# Patient Record
Sex: Female | Born: 1945
Health system: Southern US, Community
[De-identification: ages and names within clinical notes are randomized; demographics above are authoritative.]

## PROBLEM LIST (undated history)

## (undated) DIAGNOSIS — L600 Ingrowing nail: Secondary | ICD-10-CM

## (undated) DIAGNOSIS — M1990 Unspecified osteoarthritis, unspecified site: Secondary | ICD-10-CM

## (undated) DIAGNOSIS — M069 Rheumatoid arthritis, unspecified: Secondary | ICD-10-CM

## (undated) DIAGNOSIS — I10 Essential (primary) hypertension: Secondary | ICD-10-CM

## (undated) DIAGNOSIS — J383 Other diseases of vocal cords: Secondary | ICD-10-CM

## (undated) DIAGNOSIS — C801 Malignant (primary) neoplasm, unspecified: Secondary | ICD-10-CM

## (undated) DIAGNOSIS — Z923 Personal history of irradiation: Secondary | ICD-10-CM

## (undated) HISTORY — DX: Essential (primary) hypertension: I10

## (undated) HISTORY — PX: ECTOPIC PREGNANCY SURGERY: SHX613

## (undated) HISTORY — DX: Ingrowing nail: L60.0

## (undated) HISTORY — PX: EXTERNAL EAR SURGERY: SHX627

## (undated) HISTORY — DX: Unspecified osteoarthritis, unspecified site: M19.90

## (undated) HISTORY — DX: Malignant (primary) neoplasm, unspecified: C80.1

---

## 2013-03-24 DIAGNOSIS — S82899A Other fracture of unspecified lower leg, initial encounter for closed fracture: Secondary | ICD-10-CM | POA: Diagnosis not present

## 2013-03-26 DIAGNOSIS — S8263XA Displaced fracture of lateral malleolus of unspecified fibula, initial encounter for closed fracture: Secondary | ICD-10-CM | POA: Diagnosis not present

## 2013-05-30 DIAGNOSIS — S8263XA Displaced fracture of lateral malleolus of unspecified fibula, initial encounter for closed fracture: Secondary | ICD-10-CM | POA: Diagnosis not present

## 2013-07-18 DIAGNOSIS — S8263XA Displaced fracture of lateral malleolus of unspecified fibula, initial encounter for closed fracture: Secondary | ICD-10-CM | POA: Diagnosis not present

## 2013-11-05 DIAGNOSIS — M159 Polyosteoarthritis, unspecified: Secondary | ICD-10-CM | POA: Diagnosis not present

## 2013-11-05 DIAGNOSIS — F17200 Nicotine dependence, unspecified, uncomplicated: Secondary | ICD-10-CM | POA: Diagnosis not present

## 2013-11-05 DIAGNOSIS — E7800 Pure hypercholesterolemia, unspecified: Secondary | ICD-10-CM | POA: Diagnosis not present

## 2013-11-05 DIAGNOSIS — I10 Essential (primary) hypertension: Secondary | ICD-10-CM | POA: Diagnosis not present

## 2013-11-27 ENCOUNTER — Ambulatory Visit (INDEPENDENT_AMBULATORY_CARE_PROVIDER_SITE_OTHER): Payer: Medicare Other | Admitting: Internal Medicine

## 2013-11-27 VITALS — BP 122/70 | HR 94 | Temp 98.1°F | Resp 16 | Ht 63.0 in | Wt 117.4 lb

## 2013-11-27 DIAGNOSIS — M2550 Pain in unspecified joint: Secondary | ICD-10-CM

## 2013-11-27 DIAGNOSIS — R61 Generalized hyperhidrosis: Secondary | ICD-10-CM

## 2013-11-27 DIAGNOSIS — M069 Rheumatoid arthritis, unspecified: Secondary | ICD-10-CM | POA: Diagnosis not present

## 2013-11-27 LAB — POCT URINALYSIS DIPSTICK
BILIRUBIN UA: NEGATIVE
Glucose, UA: NEGATIVE
Ketones, UA: NEGATIVE
Leukocytes, UA: NEGATIVE
NITRITE UA: NEGATIVE
Protein, UA: NEGATIVE
Spec Grav, UA: 1.01
UROBILINOGEN UA: 0.2
pH, UA: 5

## 2013-11-27 LAB — POCT CBC
Granulocyte percent: 70.3 %G (ref 37–80)
HCT, POC: 33.4 % — AB (ref 37.7–47.9)
HEMOGLOBIN: 10.5 g/dL — AB (ref 12.2–16.2)
Lymph, poc: 2.1 (ref 0.6–3.4)
MCH: 29.9 pg (ref 27–31.2)
MCHC: 31.4 g/dL — AB (ref 31.8–35.4)
MCV: 95.2 fL (ref 80–97)
MID (CBC): 0.7 (ref 0–0.9)
MPV: 7.1 fL (ref 0–99.8)
PLATELET COUNT, POC: 670 10*3/uL — AB (ref 142–424)
POC GRANULOCYTE: 6.6 (ref 2–6.9)
POC LYMPH %: 22.6 % (ref 10–50)
POC MID %: 7.1 % (ref 0–12)
RBC: 3.51 M/uL — AB (ref 4.04–5.48)
RDW, POC: 13.5 %
WBC: 9.4 10*3/uL (ref 4.6–10.2)

## 2013-11-27 LAB — POCT UA - MICROSCOPIC ONLY
CASTS, UR, LPF, POC: NEGATIVE
Crystals, Ur, HPF, POC: NEGATIVE
Mucus, UA: NEGATIVE
YEAST UA: NEGATIVE

## 2013-11-27 MED ORDER — IBUPROFEN 600 MG PO TABS: 600.0000 mg | ORAL_TABLET | Freq: Three times a day (TID) | ORAL | Status: DC | PRN

## 2013-11-27 MED ORDER — HYDROCODONE-ACETAMINOPHEN 5-325 MG PO TABS: 1.0000 | ORAL_TABLET | Freq: Four times a day (QID) | ORAL | Status: DC | PRN

## 2013-11-27 NOTE — Patient Instructions (Signed)

## 2013-11-27 NOTE — Progress Notes (Signed)
Subjective:    Patient ID: Jean [name], female    DOB: [birth year], 68 y.o.   MRN: [id number]  HPI  Patient presents today with swollen joints. The joint pain started at the beginning of May. She takes Motrin to help her joints to move. She feels she is taking to much Motrin daily. She has joint pain in the ankles, knees, wrists, hands, elbows, shoulders. She is having a hard time walking, when she sits she gets real stiff. She states that her knuckles have bumps that have started at the beginning of May. She has swelling on her fingers and she is unable to open cans or bottles. She states that the swelling aroung her feet and ankles do not stop swelling. No rashes, no diarrhea, she does have some constipation, she is having night sweats, no tick bites, no fever, no respritory symptoms, no anorexia No ticks seen and low risk for ticks.  Review of Systems  Constitutional: Positive for diaphoresis. Negative for fever, appetite change, fatigue and unexpected weight change.  HENT: Negative.   Eyes: Negative.   Respiratory: Negative.   Cardiovascular: Negative.   Gastrointestinal: Negative.   Endocrine: Negative.   Genitourinary: Negative.   Musculoskeletal: Positive for arthralgias and joint swelling. Negative for myalgias and neck pain.  Skin: Negative.   Allergic/Immunologic: Positive for environmental allergies.  Neurological: Negative.   Hematological: Negative.   Psychiatric/Behavioral: Negative.        Objective:   Physical Exam  Constitutional: She is oriented to person, place, and time. She appears well-developed and well-nourished. She appears distressed.  HENT:  Head: Normocephalic.  Right Ear: External ear normal.  Left Ear: External ear normal.  Nose: Nose normal.  Mouth/Throat: Oropharynx is clear and moist.  Eyes: Conjunctivae and EOM are normal. Pupils are equal, round, and reactive to light. No scleral icterus.  Neck: Normal range of motion. Neck supple. No  tracheal deviation present. No thyromegaly present.  Cardiovascular: Normal rate, regular rhythm and normal heart sounds.   Pulmonary/Chest: Effort normal and breath sounds normal.  Musculoskeletal: She exhibits tenderness.  Right hand and wrist swollen, warm, tender Left hand less involved Shoulders and knees tender no erythema or sweeling appreciable Feet minimally involved Spine nit involved  Lymphadenopathy:    She has no cervical adenopathy.  Neurological: She is alert and oriented to person, place, and time. She exhibits normal muscle tone. Coordination normal.  Skin: No rash noted.  Psychiatric: She has a normal mood and affect. Her behavior is normal. Thought content normal.     Arthritis panel/Lyme screen/Sed rate     Assessment & Plan:  Acute asymmetric polyarticular arthritis and arthralgias Motrin 600mg  tid pc/Vicodin prn

## 2013-11-28 LAB — COMPLETE METABOLIC PANEL WITH GFR
ALK PHOS: 108 U/L (ref 39–117)
ALT: 13 U/L (ref 0–35)
AST: 16 U/L (ref 0–37)
Albumin: 3.6 g/dL (ref 3.5–5.2)
BUN: 14 mg/dL (ref 6–23)
CALCIUM: 9.3 mg/dL (ref 8.4–10.5)
CHLORIDE: 103 meq/L (ref 96–112)
CO2: 23 mEq/L (ref 19–32)
Creat: 0.73 mg/dL (ref 0.50–1.10)
GFR, Est African American: >89 mL/min
GFR, Est Non African American: 87 mL/min
Glucose, Bld: 115 mg/dL — ABNORMAL HIGH (ref 70–99)
POTASSIUM: 4.2 meq/L (ref 3.5–5.3)
Sodium: 136 mEq/L (ref 135–145)
Total Bilirubin: 0.3 mg/dL (ref 0.2–1.2)
Total Protein: 7 g/dL (ref 6.0–8.3)

## 2013-11-28 LAB — RHEUMATOID FACTOR: Rhuematoid fact SerPl-aCnc: 10 IU/mL (ref <=14)

## 2013-11-28 LAB — SEDIMENTATION RATE: Sed Rate: 67 mm/hr — ABNORMAL HIGH (ref 0–22)

## 2013-11-28 LAB — TSH: TSH: 1.855 u[IU]/mL (ref 0.350–4.500)

## 2013-11-30 ENCOUNTER — Ambulatory Visit (INDEPENDENT_AMBULATORY_CARE_PROVIDER_SITE_OTHER): Payer: Medicare Other | Admitting: Internal Medicine

## 2013-11-30 ENCOUNTER — Ambulatory Visit
Admission: RE | Admit: 2013-11-30 | Discharge: 2013-11-30 | Disposition: A | Discharge status: Home / Self Care | Payer: Medicare Other | Source: Ambulatory Visit | Attending: Rheumatology | Admitting: Rheumatology

## 2013-11-30 ENCOUNTER — Other Ambulatory Visit: Payer: Self-pay | Admitting: Rheumatology

## 2013-11-30 VITALS — BP 124/82 | HR 90 | Temp 97.8°F | Resp 16 | Ht 64.0 in | Wt 117.2 lb

## 2013-11-30 DIAGNOSIS — R9389 Abnormal findings on diagnostic imaging of other specified body structures: Secondary | ICD-10-CM | POA: Diagnosis not present

## 2013-11-30 DIAGNOSIS — M130 Polyarthritis, unspecified: Secondary | ICD-10-CM

## 2013-11-30 DIAGNOSIS — Z113 Encounter for screening for infections with a predominantly sexual mode of transmission: Secondary | ICD-10-CM | POA: Diagnosis not present

## 2013-11-30 DIAGNOSIS — R700 Elevated erythrocyte sedimentation rate: Secondary | ICD-10-CM | POA: Diagnosis not present

## 2013-11-30 DIAGNOSIS — M25549 Pain in joints of unspecified hand: Secondary | ICD-10-CM | POA: Diagnosis not present

## 2013-11-30 DIAGNOSIS — I011 Acute rheumatic endocarditis: Secondary | ICD-10-CM

## 2013-11-30 DIAGNOSIS — M25579 Pain in unspecified ankle and joints of unspecified foot: Secondary | ICD-10-CM | POA: Diagnosis not present

## 2013-11-30 DIAGNOSIS — M353 Polymyalgia rheumatica: Secondary | ICD-10-CM | POA: Diagnosis not present

## 2013-11-30 DIAGNOSIS — M25469 Effusion, unspecified knee: Secondary | ICD-10-CM | POA: Diagnosis not present

## 2013-11-30 DIAGNOSIS — M2550 Pain in unspecified joint: Secondary | ICD-10-CM | POA: Diagnosis not present

## 2013-11-30 DIAGNOSIS — R5383 Other fatigue: Secondary | ICD-10-CM | POA: Diagnosis not present

## 2013-11-30 DIAGNOSIS — M069 Rheumatoid arthritis, unspecified: Secondary | ICD-10-CM | POA: Diagnosis not present

## 2013-11-30 DIAGNOSIS — M25569 Pain in unspecified knee: Secondary | ICD-10-CM | POA: Diagnosis not present

## 2013-11-30 DIAGNOSIS — Z111 Encounter for screening for respiratory tuberculosis: Secondary | ICD-10-CM | POA: Diagnosis not present

## 2013-11-30 DIAGNOSIS — R892 Abnormal level of other drugs, medicaments and biological substances in specimens from other organs, systems and tissues: Secondary | ICD-10-CM | POA: Diagnosis not present

## 2013-11-30 DIAGNOSIS — R5381 Other malaise: Secondary | ICD-10-CM | POA: Diagnosis not present

## 2013-11-30 DIAGNOSIS — Z79899 Other long term (current) drug therapy: Secondary | ICD-10-CM | POA: Diagnosis not present

## 2013-11-30 NOTE — Progress Notes (Signed)
Subjective:    Patient ID: Jean [name], female    DOB: 06/08/1946, 68 y.o.   MRN: [id number]  HPI 68 year old here for a follow up on joint pain. She is getting some relief with the pain medicine but the pain continues when it wears off. She has limited motion in both shoulders, knees and wrists. Swelling is not as bad today. She is progresively feeling worse altho motrin helps.Labs show elevated sed rate, anemia. RA is negative. No fever felt. Harder to rise up out of the chair and walks still legged due to pain   Review of Systems     Objective:   Physical Exam  Constitutional: She is oriented to person, place, and time. She appears well-developed and well-nourished. She appears distressed.  HENT:  Head: Normocephalic.  Eyes: EOM are normal. No scleral icterus.  Neck: Normal range of motion.  Pulmonary/Chest: Effort normal.  Musculoskeletal: She exhibits edema and tenderness.  Shoulders and and knees are most tender with inflammation. Hands , fingers , and wrists are symmetically swollen and tender Gait is stiff legged due to pain in joints.  Neurological: She is alert and oriented to person, place, and time. No cranial nerve deficit or sensory deficit. She exhibits abnormal muscle tone. Coordination and gait abnormal.  Skin: Rash noted.  Psychiatric: She has a normal mood and affect. Her behavior is normal.     Results for orders placed in visit on 11/27/13  COMPLETE METABOLIC PANEL WITH GFR      Result Value Ref Range   Sodium 136  135 - 145 mEq/L   Potassium 4.2  3.5 - 5.3 mEq/L   Chloride 103  96 - 112 mEq/L   CO2 23  19 - 32 mEq/L   Glucose, Bld 115 (*) 70 - 99 mg/dL   BUN 14  6 - 23 mg/dL   Creat 8.29  5.62 - 1.30 mg/dL   Total Bilirubin 0.3  0.2 - 1.2 mg/dL   Alkaline Phosphatase 108  39 - 117 U/L   AST 16  0 - 37 U/L   ALT 13  0 - 35 U/L   Total Protein 7.0  6.0 - 8.3 g/dL   Albumin 3.6  3.5 - 5.2 g/dL   Calcium 9.3  8.4 - 86.5 mg/dL   GFR, Est  African American >89     GFR, Est Non African American 87    TSH      Result Value Ref Range   TSH 1.855  0.350 - 4.500 uIU/mL  RHEUMATOID FACTOR      Result Value Ref Range   Rheumatoid Factor 10  <=14 IU/mL  SEDIMENTATION RATE      Result Value Ref Range   Sed Rate 67 (*) 0 - 22 mm/hr  POCT CBC      Result Value Ref Range   WBC 9.4  4.6 - 10.2 K/uL   Lymph, poc 2.1  0.6 - 3.4   POC LYMPH PERCENT 22.6  10 - 50 %L   MID (cbc) 0.7  0 - 0.9   POC MID % 7.1  0 - 12 %M   POC Granulocyte 6.6  2 - 6.9   Granulocyte percent 70.3  37 - 80 %G   RBC 3.51 (*) 4.04 - 5.48 M/uL   Hemoglobin 10.5 (*) 12.2 - 16.2 g/dL   HCT, POC 78.4 (*) 69.6 - 47.9 %   MCV 95.2  80 - 97 fL   MCH, POC 29.9  27 - 31.2 pg   MCHC 31.4 (*) 31.8 - 35.4 g/dL   RDW, POC 84.1     Platelet Count, POC 670 (*) 142 - 424 K/uL   MPV 7.1  0 - 99.8 fL  POCT UA - MICROSCOPIC ONLY      Result Value Ref Range   WBC, Ur, HPF, POC 0-2     RBC, urine, microscopic 1-4     Bacteria, U Microscopic trace     Mucus, UA neg     Epithelial cells, urine per micros 0-2     Crystals, Ur, HPF, POC neg     Casts, Ur, LPF, POC neg     Yeast, UA neg    POCT URINALYSIS DIPSTICK      Result Value Ref Range   Color, UA yellow     Clarity, UA clear     Glucose, UA neg     Bilirubin, UA neg     Ketones, UA neg     Spec Grav, UA 1.010     Blood, UA small     pH, UA 5.0     Protein, UA neg     Urobilinogen, UA 0.2     Nitrite, UA neg     Leukocytes, UA Negative          Assessment & Plan:  Anemia/Polyarticular asymmetric arthritis/Probable PMR Refer urgently today to Dr. Netta Neat called and spoke with her

## 2013-11-30 NOTE — Progress Notes (Signed)
Subjective:    Patient ID: Jean [name], female    DOB: [birth year], 68 y.o.   MRN: [id number]  HPI    Review of Systems     Objective:   Physical Exam        Assessment & Plan:

## 2013-11-30 NOTE — Patient Instructions (Signed)
Polymyalgia Rheumatica Polymyalgia rheumatica (also called PMR or polymyalgia) is a rheumatologic (arthritic) condition that causes pain and morning stiffness in your neck, shoulders, and hips. It is an inflammatory condition. In some people, inflammation of certain structures in the shoulder, hips, or other joints can be seen on special testing. It does not cause joint destruction, as occurs in other arthritic conditions. It usually occurs after 66 years of age, and is more common as you age. It can be confused with several other diseases, but it is usually easily treated. People with PMR often have, or can develop, a more severe rheumatologic condition called giant cell arteritis (also called CGA or temporal arteritis).  CAUSES  The exact cause of PMR is not known.   There are genetic factors involved.  Viruses have been suspected in the cause of PMR. This has not been proven. SYMPTOMS   Aching, pain, and morning stiffness your neck, both shoulders, or both hips.  Symptoms usually start slowly and build gradually.  Morning stiffness usually lasts at least 30 minutes.  Swelling and tenderness in other joints of the arms, hands, legs, and feet may occur.  Swelling and inflammation in the wrists can cause nerve inflammation at the wrist (carpal tunnel syndrome).  You may also have low grade fever, fatigue, weakness, decreased appetite and weight loss. DIAGNOSIS   Your caregiver may suspect that you have PMR based on your description of your symptoms and on your exam.  Your caregiver will examine you to be sure you do not have diseases that can be confused with PMR. These diseases include rheumatoid arthritis, fibromyalgia, or thyroid disease.  Your caregiver should check for signs of giant cell arteritis. This can cause serious complications such as blindness.  Lab tests can help confirm that you have PMR and not other diseases, but are sometimes inconclusive.  X-rays cannot show PMR.  However, it can identify other diseases like rheumatoid arthritis. Your caregiver may have you see a specialist in arthritis and inflammatory diseases (rheumatologist). TREATMENT  The goal of treatment is relief of symptoms. Treatment does not shorten the course of the illness or prevent complications. With proper treatment, you usually feel better almost right away.   The initial treatment of PMR is usually a cortisone (steroid) medication. Your caregiver will help determine a starting dose. The dose is gradually reduced every few weeks to months. Treatment usually lasts one to three years.  Other stronger medications are rarely needed. They will only be prescribed if your symptoms do not get better on cortisone medication alone, or if they recur as the dose is reduced.  Cortisone medication can have different side effects. With the doses of cortisone needed for PMR, the side effects can affect bones and joints, blood sugar control in diabetes, and mood changes. Discuss this with your caregiver.  Your caregiver will evaluate you regularly during your treatment. They will do this in order to assess progress and to check for complications of the illness or treatment.  Physical therapy is sometimes useful. This is especially true if your joints are still stiff after other symptoms have improved. HOME CARE INSTRUCTIONS   Follow your caregiver's instructions. Do not change your dose of cortisone medication on your own.  Keep your appointments for follow-up lab tests and caregiver visits. Your lab tests need to be monitored. You must get checked periodically for giant cell arteritis.  Follow your caregiver's guidance regarding physical activity (usually no restrictions are needed) or physical therapy.  Your caregiver  may have instructions to prevent or check for side effects from cortisone medication (including bone density testing or treatment). Follow their instructions carefully. SEEK MEDICAL  CARE IF:   You develop any side effects from treatment. Side effects can include:  Elevated blood pressure.  High blood sugar (or worsening of diabetes, if you are diabetic).  Difficulty fighting off infections.  Weight gain.  Weakness of the bones (osteoporosis).  Your aches, pains, morning stiffness, or other symptoms get worse with time. This is especially true after your dose of cortisone is reduced.  You develop new joint symptoms (pain, swelling, etc.) SEEK IMMEDIATE MEDICAL CARE IF:   You develop a severe headache.  You start vomiting.  You have problems with your vision.  You have an oral temperature above 102 F (38.9 C), not controlled by medicine. Document Released: 07/15/2004 Document Revised: 05/24/2012 Document Reviewed: 10/28/2008 [facility] Patient Information 2014 [place], [place].

## 2013-12-07 DIAGNOSIS — Z111 Encounter for screening for respiratory tuberculosis: Secondary | ICD-10-CM | POA: Diagnosis not present

## 2013-12-14 DIAGNOSIS — M159 Polyosteoarthritis, unspecified: Secondary | ICD-10-CM | POA: Diagnosis not present

## 2013-12-14 DIAGNOSIS — Z111 Encounter for screening for respiratory tuberculosis: Secondary | ICD-10-CM | POA: Diagnosis not present

## 2013-12-14 DIAGNOSIS — I10 Essential (primary) hypertension: Secondary | ICD-10-CM | POA: Diagnosis not present

## 2013-12-14 DIAGNOSIS — E7800 Pure hypercholesterolemia, unspecified: Secondary | ICD-10-CM | POA: Diagnosis not present

## 2013-12-20 DIAGNOSIS — M2550 Pain in unspecified joint: Secondary | ICD-10-CM | POA: Diagnosis not present

## 2013-12-20 DIAGNOSIS — M069 Rheumatoid arthritis, unspecified: Secondary | ICD-10-CM | POA: Diagnosis not present

## 2013-12-20 DIAGNOSIS — M25469 Effusion, unspecified knee: Secondary | ICD-10-CM | POA: Diagnosis not present

## 2014-01-23 DIAGNOSIS — Z79899 Other long term (current) drug therapy: Secondary | ICD-10-CM | POA: Diagnosis not present

## 2014-02-09 DIAGNOSIS — Z79899 Other long term (current) drug therapy: Secondary | ICD-10-CM | POA: Diagnosis not present

## 2014-02-22 DIAGNOSIS — M25549 Pain in joints of unspecified hand: Secondary | ICD-10-CM | POA: Diagnosis not present

## 2014-02-22 DIAGNOSIS — M069 Rheumatoid arthritis, unspecified: Secondary | ICD-10-CM | POA: Diagnosis not present

## 2014-02-22 DIAGNOSIS — R6889 Other general symptoms and signs: Secondary | ICD-10-CM | POA: Diagnosis not present

## 2014-02-22 DIAGNOSIS — Z09 Encounter for follow-up examination after completed treatment for conditions other than malignant neoplasm: Secondary | ICD-10-CM | POA: Diagnosis not present

## 2014-03-04 ENCOUNTER — Telehealth: Payer: Self-pay | Admitting: Oncology

## 2014-03-04 DIAGNOSIS — Z79899 Other long term (current) drug therapy: Secondary | ICD-10-CM | POA: Diagnosis not present

## 2014-03-04 NOTE — Telephone Encounter (Signed)
C/D 03/04/14 for appt. 03/05/14

## 2014-03-04 NOTE — Telephone Encounter (Signed)
S/W PATIENT AND GAVE NP APPT FOR 09/15 @ 10:30 W/DR. SHADAD.  REFERRING DR. Terrence Dupont DX- PLT CT 808

## 2014-03-05 ENCOUNTER — Ambulatory Visit: Payer: Medicare Other

## 2014-03-05 ENCOUNTER — Encounter: Payer: Self-pay | Admitting: Oncology

## 2014-03-05 ENCOUNTER — Other Ambulatory Visit (HOSPITAL_BASED_OUTPATIENT_CLINIC_OR_DEPARTMENT_OTHER): Payer: Medicare Other

## 2014-03-05 ENCOUNTER — Telehealth: Payer: Self-pay | Admitting: Oncology

## 2014-03-05 ENCOUNTER — Other Ambulatory Visit: Payer: Self-pay | Admitting: Oncology

## 2014-03-05 ENCOUNTER — Ambulatory Visit (HOSPITAL_BASED_OUTPATIENT_CLINIC_OR_DEPARTMENT_OTHER): Payer: Medicare Other | Admitting: Oncology

## 2014-03-05 VITALS — BP 149/84 | HR 80 | Temp 98.4°F | Resp 18 | Ht 64.0 in | Wt 126.5 lb

## 2014-03-05 DIAGNOSIS — D473 Essential (hemorrhagic) thrombocythemia: Secondary | ICD-10-CM

## 2014-03-05 DIAGNOSIS — D75839 Thrombocytosis, unspecified: Secondary | ICD-10-CM

## 2014-03-05 DIAGNOSIS — D72829 Elevated white blood cell count, unspecified: Secondary | ICD-10-CM | POA: Diagnosis not present

## 2014-03-05 DIAGNOSIS — D638 Anemia in other chronic diseases classified elsewhere: Secondary | ICD-10-CM | POA: Diagnosis not present

## 2014-03-05 DIAGNOSIS — D66 Hereditary factor VIII deficiency: Secondary | ICD-10-CM

## 2014-03-05 LAB — CBC WITH DIFFERENTIAL/PLATELET
BASO%: 0.4 % (ref 0.0–2.0)
BASOS ABS: 0.1 10*3/uL (ref 0.0–0.1)
EOS ABS: 0.1 10*3/uL (ref 0.0–0.5)
EOS%: 0.4 % (ref 0.0–7.0)
HCT: 36.2 % (ref 34.8–46.6)
HEMOGLOBIN: 11.3 g/dL — AB (ref 11.6–15.9)
LYMPH%: 8.3 % — AB (ref 14.0–49.7)
MCH: 28.8 pg (ref 25.1–34.0)
MCHC: 31.2 g/dL — ABNORMAL LOW (ref 31.5–36.0)
MCV: 92.2 fL (ref 79.5–101.0)
MONO#: 0.5 10*3/uL (ref 0.1–0.9)
MONO%: 3 % (ref 0.0–14.0)
NEUT#: 13.7 10*3/uL — ABNORMAL HIGH (ref 1.5–6.5)
NEUT%: 87.9 % — ABNORMAL HIGH (ref 38.4–76.8)
Platelets: 414 10*3/uL — ABNORMAL HIGH (ref 145–400)
RBC: 3.92 10*6/uL (ref 3.70–5.45)
RDW: 19.5 % — ABNORMAL HIGH (ref 11.2–14.5)
WBC: 15.5 10*3/uL — AB (ref 3.9–10.3)
lymph#: 1.3 10*3/uL (ref 0.9–3.3)

## 2014-03-05 LAB — COMPREHENSIVE METABOLIC PANEL (CC13)
ALT: 28 U/L (ref 0–55)
ANION GAP: 8 meq/L (ref 3–11)
AST: 12 U/L (ref 5–34)
Albumin: 3.2 g/dL — ABNORMAL LOW (ref 3.5–5.0)
Alkaline Phosphatase: 85 U/L (ref 40–150)
BILIRUBIN TOTAL: 0.2 mg/dL (ref 0.20–1.20)
BUN: 10.6 mg/dL (ref 7.0–26.0)
CO2: 24 meq/L (ref 22–29)
Calcium: 8.8 mg/dL (ref 8.4–10.4)
Chloride: 109 mEq/L (ref 98–109)
Creatinine: 0.8 mg/dL (ref 0.6–1.1)
GLUCOSE: 99 mg/dL (ref 70–140)
Potassium: 3.9 mEq/L (ref 3.5–5.1)
SODIUM: 141 meq/L (ref 136–145)
Total Protein: 6.5 g/dL (ref 6.4–8.3)

## 2014-03-05 LAB — CHCC SMEAR

## 2014-03-05 NOTE — Consult Note (Signed)
Reason for Referral: Thrombocytosis.   HPI: 68 year old pleasant woman native of [place] but currently lives in this area now. She has a history of hypertension but for the most part have been relatively healthy. She is a retired [employer] and currently in house her daughter in the care of her grandchildren. She was in her usual state of health till about May of 2015 where she presented with diffuse arthralgias and myalgias. She noted hand and joint swelling and subsequently shoulder pain and was debilitated enough that she could not able to ambulate. She was evaluated at urgent care in June of 2015. At that time her hemoglobin was 10.5 and white blood count 9.4. Her sedimentation rate was elevated at 67. She was evaluated by rheumatology and was started initially on high doses of prednisone and subsequently IM methotrexate. She was tapered slowly from prednisone but had a mild relapse of her joint pain. Most recently she is tapered down to prednisone 20 mg and methotrexate 0.8 IM weekly. Laboratory testing on 01/23/2049 you reveal a platelet count of 547 and her most recent CBC showed a platelet count of 808 hemoglobin of 9.5 and white cell count of 15.9. Her MCV is 87 with a normal differential. Patient referred to me for further evaluation.  Clinically, her symptoms has improved significantly. She still have some tenderness in her right hand but overall joint pain and inflammation have improved dramatically. She is not report any headaches or blurred vision no syncope. Does not report any fevers chills or sweats. She does not report any thrombosis or bleeding. She is not report any chest pain cough or hemoptysis. She does report any nausea, vomiting, abdominal pain or change in her bowel habits. She does report any frequency urgency or hesitancy. She does report to you as well as a mild as mentioned above. Rest of the review of systems unremarkable   Past Medical History  Diagnosis Date  .  Hypertension   :  Current Outpatient Prescriptions  Medication Sig Dispense Refill  . folic acid (FOLVITE) 1 MG tablet Take 1 mg by mouth daily.      Marland Kitchen losartan (COZAAR) 50 MG tablet Take 50 mg by mouth daily.      . methotrexate (50 MG/ML) 1 G injection Inject 0.8 mg into the muscle once a week.      . predniSONE (DELTASONE) 20 MG tablet Take 20 mg by mouth daily with breakfast. Next week will start 15 mg a day       No current facility-administered medications for this visit.     Allergies  Allergen Reactions  . Sulfa Antibiotics   :  No family history on file.:  History   Social History  . Marital Status: Married    Spouse Name: N/A    Number of Children: N/A  . Years of Education: N/A   Occupational History  . Not on file.   Social History Main Topics  . Smoking status: Former Smoker    Quit date: 11/07/2013  . Smokeless tobacco: Not on file  . Alcohol Use: Yes  . Drug Use: No  . Sexual Activity: Not on file   Other Topics Concern  . Not on file   Social History Narrative  . No narrative on file  :  Pertinent items are noted in HPI.  Exam: ECOG 0 Blood pressure 149/84, pulse 80, temperature 98.4 F (36.9 C), temperature source Oral, resp. rate 18, height 5' 4" (1.626 m), weight 126 lb 8 oz (  57.38 kg), SpO2 100.00%. General appearance: alert Head: Normocephalic, without obvious abnormality Throat: lips, mucosa, and tongue normal; teeth and gums normal Neck: no adenopathy Back: symmetric, no curvature. ROM normal. No CVA tenderness. Resp: clear to auscultation bilaterally Cardio: regular rate and rhythm, S1, S2 normal, no murmur, click, rub or gallop GI: soft, non-tender; bowel sounds normal; no masses,  no organomegaly Extremities: extremities normal, atraumatic, no cyanosis or edema Pulses: 2+ and symmetric Skin: Skin color, texture, turgor normal. No rashes or lesions Lymph nodes: Cervical, supraclavicular, and axillary nodes normal.   Recent  Labs  03/05/14 1050  WBC 15.5*  HGB 11.3*  HCT 36.2  PLT 414*    Recent Labs  03/05/14 1050  NA 141  K 3.9  CO2 24  GLUCOSE 99  BUN 10.6  CREATININE 0.8  CALCIUM 8.8     Blood smear review: This was personally reviewed today and showed increased number of platelets.  Increased neutrophils but no evidence of any dysplasia or immature  cells.    Assessment and Plan:   68 year old woman with the following issues  1. Thrombocytosis: The differential diagnosis was discussed today in the setting of autoimmune arthritis and severe inflammatory condition. Reactive thrombocytosis is the most likely etiology in this setting. That is evidenced by her sedimentation rate being high at 67 and clinically showing signs and symptoms of inflammation. Other consideration would include iron deficiency anemia as well as myeloproliferative disease. I think these are less likely at this time given her clinical scenario. Her thrombocytosis have improved with platelet counts down to 414 which is very close to normal. This is correlated to improvement in her inflammation and her autoimmune arthritis. I am checking a JAK-2 mutation for completeness. I see no need intervention at this time for a bone marrow biopsy. A repeat her counts in about 4 months.  2. Leukocytosis: This is also reactive likely related to prednisone as well. Seems to be improving.  3. Anemia: Her hemoglobin today is 11.3 which is close to normal. Thanks this is related to chronic disease I will continue to monitor.

## 2014-03-05 NOTE — Telephone Encounter (Signed)
Pt confirmed labs/ov per 09/15 POF, gave pt AVS......KJ °

## 2014-03-05 NOTE — Progress Notes (Signed)
Checked in new pt with no financial concerns at this time.  Pt has 2 insurances so she should be covered at 100% but I gave her my card for any future questions or concerns.

## 2014-03-05 NOTE — Progress Notes (Signed)
Please see consult note.  

## 2014-03-21 DIAGNOSIS — Z79899 Other long term (current) drug therapy: Secondary | ICD-10-CM | POA: Diagnosis not present

## 2014-04-12 DIAGNOSIS — J019 Acute sinusitis, unspecified: Secondary | ICD-10-CM | POA: Diagnosis not present

## 2014-04-12 DIAGNOSIS — M810 Age-related osteoporosis without current pathological fracture: Secondary | ICD-10-CM | POA: Diagnosis not present

## 2014-04-12 DIAGNOSIS — I10 Essential (primary) hypertension: Secondary | ICD-10-CM | POA: Diagnosis not present

## 2014-04-12 DIAGNOSIS — F17290 Nicotine dependence, other tobacco product, uncomplicated: Secondary | ICD-10-CM | POA: Diagnosis not present

## 2014-04-12 DIAGNOSIS — E785 Hyperlipidemia, unspecified: Secondary | ICD-10-CM | POA: Diagnosis not present

## 2014-04-12 DIAGNOSIS — J029 Acute pharyngitis, unspecified: Secondary | ICD-10-CM | POA: Diagnosis not present

## 2014-05-24 DIAGNOSIS — Z79899 Other long term (current) drug therapy: Secondary | ICD-10-CM | POA: Diagnosis not present

## 2014-05-31 DIAGNOSIS — M0609 Rheumatoid arthritis without rheumatoid factor, multiple sites: Secondary | ICD-10-CM | POA: Diagnosis not present

## 2014-05-31 DIAGNOSIS — M79641 Pain in right hand: Secondary | ICD-10-CM | POA: Diagnosis not present

## 2014-05-31 DIAGNOSIS — Z09 Encounter for follow-up examination after completed treatment for conditions other than malignant neoplasm: Secondary | ICD-10-CM | POA: Diagnosis not present

## 2014-07-05 ENCOUNTER — Encounter: Payer: Self-pay | Admitting: *Deleted

## 2014-07-05 ENCOUNTER — Other Ambulatory Visit: Payer: Medicare Other

## 2014-07-05 ENCOUNTER — Telehealth: Payer: Self-pay | Admitting: Oncology

## 2014-07-05 ENCOUNTER — Ambulatory Visit: Payer: Medicare Other | Admitting: Oncology

## 2014-07-05 NOTE — Telephone Encounter (Signed)
Lm to confirm appt r/s from missed appt to 07/31/14. Mailed cal.

## 2014-07-05 NOTE — Progress Notes (Signed)
Spoke with patient. She forgot her lab and dr visit today. pof sent for 1st available.

## 2014-07-31 ENCOUNTER — Other Ambulatory Visit (HOSPITAL_BASED_OUTPATIENT_CLINIC_OR_DEPARTMENT_OTHER): Payer: Medicare Other

## 2014-07-31 ENCOUNTER — Ambulatory Visit (HOSPITAL_BASED_OUTPATIENT_CLINIC_OR_DEPARTMENT_OTHER): Payer: Medicare Other | Admitting: Oncology

## 2014-07-31 VITALS — BP 140/82 | HR 101 | Temp 98.4°F | Resp 18 | Ht 64.0 in | Wt 129.6 lb

## 2014-07-31 DIAGNOSIS — D75839 Thrombocytosis, unspecified: Secondary | ICD-10-CM

## 2014-07-31 DIAGNOSIS — D72829 Elevated white blood cell count, unspecified: Secondary | ICD-10-CM

## 2014-07-31 DIAGNOSIS — D473 Essential (hemorrhagic) thrombocythemia: Secondary | ICD-10-CM

## 2014-07-31 LAB — CBC WITH DIFFERENTIAL/PLATELET
BASO%: 0.6 % (ref 0.0–2.0)
Basophils Absolute: 0.1 10*3/uL (ref 0.0–0.1)
EOS%: 0.3 % (ref 0.0–7.0)
Eosinophils Absolute: 0 10*3/uL (ref 0.0–0.5)
HCT: 36.6 % (ref 34.8–46.6)
HGB: 11.5 g/dL — ABNORMAL LOW (ref 11.6–15.9)
LYMPH%: 13 % — AB (ref 14.0–49.7)
MCH: 30.2 pg (ref 25.1–34.0)
MCHC: 31.4 g/dL — ABNORMAL LOW (ref 31.5–36.0)
MCV: 96.4 fL (ref 79.5–101.0)
MONO#: 0.6 10*3/uL (ref 0.1–0.9)
MONO%: 6 % (ref 0.0–14.0)
NEUT#: 8.1 10*3/uL — ABNORMAL HIGH (ref 1.5–6.5)
NEUT%: 80.1 % — ABNORMAL HIGH (ref 38.4–76.8)
PLATELETS: 478 10*3/uL — AB (ref 145–400)
RBC: 3.8 10*6/uL (ref 3.70–5.45)
RDW: 17.2 % — ABNORMAL HIGH (ref 11.2–14.5)
WBC: 10.2 10*3/uL (ref 3.9–10.3)
lymph#: 1.3 10*3/uL (ref 0.9–3.3)

## 2014-07-31 NOTE — Progress Notes (Signed)
Hematology and Oncology Follow Up Visit  Jean [name] [id number] 1946-02-01 69 y.o. 07/31/2014 10:41 AM No PCP Per PatientNo ref. provider found   Principle Diagnosis: 69 year old woman with thrombocytosis in the setting of inflammatory arthritis likely reactive in nature. This was diagnosed in September 2015.  Current therapy: Observation and supportive management.  Interim History:  Jean [name] presents today for a follow-up visit. Since her last 69 year old woman I saw in consultation back in September 2015 for leukocytosis and thrombocytosis. She also had an elevated sedimentation rate of 67. She has a long standing history of autoimmune arthritis and currently on methotrexate. Since the last visit, she reports or methotrexate is not really helping her symptoms and she has been tapered off prednisone. Since her prednisone taper her inflammation have gotten worse. Otherwise she has really no other symptoms of infection or hospitalizations. She still have some tenderness in her right hand but overall joint pain and inflammation have improved dramatically. She is not report any headaches or blurred vision no syncope. Does not report any fevers chills or sweats. She does not report any thrombosis or bleeding. She is not report any chest pain cough or hemoptysis. She does report any nausea, vomiting, abdominal pain or change in her bowel habits. She does report any frequency urgency or hesitancy. She does report to you as well as a mild as mentioned above. Rest of the review of systems unremarkable   Medications: I have reviewed the patient's current medications.  Current Outpatient Prescriptions  Medication Sig Dispense Refill  . folic acid (FOLVITE) 1 MG tablet Take 1 mg by mouth daily.    Marland Kitchen losartan (COZAAR) 50 MG tablet Take 50 mg by mouth daily.    . methotrexate (50 MG/ML) 1 G injection Inject 0.8 mg into the muscle once a week.     No current facility-administered medications for this  visit.     Allergies:  Allergies  Allergen Reactions  . Sulfa Antibiotics     Past Medical History, Surgical history, Social history, and Family History were reviewed and updated.   Physical Exam: Blood pressure 140/82, pulse 101, temperature 98.4 F (36.9 C), temperature source Oral, resp. rate 18, height 5\' 4"  (1.626 m), weight 129 lb 9.6 oz (58.786 kg), SpO2 100 %. ECOG: 0 General appearance: alert and cooperative Head: Normocephalic, without obvious abnormality Neck: no adenopathy Lymph nodes: Cervical, supraclavicular, and axillary nodes normal. Heart:regular rate and rhythm, S1, S2 normal, no murmur, click, rub or gallop Lung:chest clear, no wheezing, rales, normal symmetric air entry Abdomin: soft, non-tender, without masses or organomegaly EXT: Bilateral swelling in her hands and wrists.   Lab Results: Lab Results  Component Value Date   WBC 10.2 07/31/2014   HGB 11.5* 07/31/2014   HCT 36.6 07/31/2014   MCV 96.4 07/31/2014   PLT 478* 07/31/2014     Chemistry      Component Value Date/Time   NA 141 03/05/2014 1050   NA 136 11/27/2013 2100   K 3.9 03/05/2014 1050   K 4.2 11/27/2013 2100   CL 103 11/27/2013 2100   CO2 24 03/05/2014 1050   CO2 23 11/27/2013 2100   BUN 10.6 03/05/2014 1050   BUN 14 11/27/2013 2100   CREATININE 0.8 03/05/2014 1050   CREATININE 0.73 11/27/2013 2100      Component Value Date/Time   CALCIUM 8.8 03/05/2014 1050   CALCIUM 9.3 11/27/2013 2100   ALKPHOS 85 03/05/2014 1050   ALKPHOS 108 11/27/2013 2100   AST 12  03/05/2014 1050   AST 16 11/27/2013 2100   ALT 28 03/05/2014 1050   ALT 13 11/27/2013 2100   BILITOT 0.20 03/05/2014 1050   BILITOT 0.3 11/27/2013 2100       Impression and Plan:     70 year old woman with the following issues:  1. Thrombocytosis: The differential diagnosis include reactive finding related to autoimmune arthritis versus other secondary causes. Primary myeloproliferative disorder extremely  unlikely. Her platelet count has been fluctuating and has been correlating with the level of inflammation. Her platelet count dropped down to 414 with treatments of prednisone and now they're up to 478 with her recent flare.  I see no reason for any other hematological workup at this time as I see no sign of a primary hematological condition.  2. Leukocytosis: Was related to prednisone seems to be normalizing.  3. Follow-up: I'll be happy to see her in the future as needed.    [facility], MD 2/10/201610:41 AM

## 2014-08-06 DIAGNOSIS — Z79899 Other long term (current) drug therapy: Secondary | ICD-10-CM | POA: Diagnosis not present

## 2014-08-06 DIAGNOSIS — Z113 Encounter for screening for infections with a predominantly sexual mode of transmission: Secondary | ICD-10-CM | POA: Diagnosis not present

## 2014-08-08 DIAGNOSIS — M25511 Pain in right shoulder: Secondary | ICD-10-CM | POA: Diagnosis not present

## 2014-08-08 DIAGNOSIS — D649 Anemia, unspecified: Secondary | ICD-10-CM | POA: Diagnosis not present

## 2014-08-08 DIAGNOSIS — M25561 Pain in right knee: Secondary | ICD-10-CM | POA: Diagnosis not present

## 2014-08-08 DIAGNOSIS — M79641 Pain in right hand: Secondary | ICD-10-CM | POA: Diagnosis not present

## 2014-08-26 DIAGNOSIS — F17290 Nicotine dependence, other tobacco product, uncomplicated: Secondary | ICD-10-CM | POA: Diagnosis not present

## 2014-08-26 DIAGNOSIS — E785 Hyperlipidemia, unspecified: Secondary | ICD-10-CM | POA: Diagnosis not present

## 2014-08-26 DIAGNOSIS — I10 Essential (primary) hypertension: Secondary | ICD-10-CM | POA: Diagnosis not present

## 2014-08-26 DIAGNOSIS — M1990 Unspecified osteoarthritis, unspecified site: Secondary | ICD-10-CM | POA: Diagnosis not present

## 2014-08-26 DIAGNOSIS — M069 Rheumatoid arthritis, unspecified: Secondary | ICD-10-CM | POA: Diagnosis not present

## 2014-09-03 DIAGNOSIS — M069 Rheumatoid arthritis, unspecified: Secondary | ICD-10-CM | POA: Diagnosis not present

## 2014-10-18 DIAGNOSIS — Z79899 Other long term (current) drug therapy: Secondary | ICD-10-CM | POA: Diagnosis not present

## 2014-11-25 DIAGNOSIS — E785 Hyperlipidemia, unspecified: Secondary | ICD-10-CM | POA: Diagnosis not present

## 2014-11-25 DIAGNOSIS — M1990 Unspecified osteoarthritis, unspecified site: Secondary | ICD-10-CM | POA: Diagnosis not present

## 2014-11-25 DIAGNOSIS — M069 Rheumatoid arthritis, unspecified: Secondary | ICD-10-CM | POA: Diagnosis not present

## 2014-11-25 DIAGNOSIS — F17290 Nicotine dependence, other tobacco product, uncomplicated: Secondary | ICD-10-CM | POA: Diagnosis not present

## 2014-11-25 DIAGNOSIS — I10 Essential (primary) hypertension: Secondary | ICD-10-CM | POA: Diagnosis not present

## 2014-12-31 DIAGNOSIS — Z79899 Other long term (current) drug therapy: Secondary | ICD-10-CM | POA: Diagnosis not present

## 2015-01-09 DIAGNOSIS — M79642 Pain in left hand: Secondary | ICD-10-CM | POA: Diagnosis not present

## 2015-01-09 DIAGNOSIS — M0579 Rheumatoid arthritis with rheumatoid factor of multiple sites without organ or systems involvement: Secondary | ICD-10-CM | POA: Diagnosis not present

## 2015-01-09 DIAGNOSIS — M79641 Pain in right hand: Secondary | ICD-10-CM | POA: Diagnosis not present

## 2015-01-09 DIAGNOSIS — M25561 Pain in right knee: Secondary | ICD-10-CM | POA: Diagnosis not present

## 2015-03-26 DIAGNOSIS — E785 Hyperlipidemia, unspecified: Secondary | ICD-10-CM | POA: Diagnosis not present

## 2015-03-26 DIAGNOSIS — I10 Essential (primary) hypertension: Secondary | ICD-10-CM | POA: Diagnosis not present

## 2015-03-26 DIAGNOSIS — M1990 Unspecified osteoarthritis, unspecified site: Secondary | ICD-10-CM | POA: Diagnosis not present

## 2015-03-26 DIAGNOSIS — F17290 Nicotine dependence, other tobacco product, uncomplicated: Secondary | ICD-10-CM | POA: Diagnosis not present

## 2015-03-26 DIAGNOSIS — M069 Rheumatoid arthritis, unspecified: Secondary | ICD-10-CM | POA: Diagnosis not present

## 2015-04-28 DIAGNOSIS — Z79899 Other long term (current) drug therapy: Secondary | ICD-10-CM | POA: Diagnosis not present

## 2015-05-02 DIAGNOSIS — E785 Hyperlipidemia, unspecified: Secondary | ICD-10-CM | POA: Diagnosis not present

## 2015-05-02 DIAGNOSIS — M069 Rheumatoid arthritis, unspecified: Secondary | ICD-10-CM | POA: Diagnosis not present

## 2015-05-02 DIAGNOSIS — I10 Essential (primary) hypertension: Secondary | ICD-10-CM | POA: Diagnosis not present

## 2015-05-28 DIAGNOSIS — M79671 Pain in right foot: Secondary | ICD-10-CM | POA: Diagnosis not present

## 2015-05-28 DIAGNOSIS — M0579 Rheumatoid arthritis with rheumatoid factor of multiple sites without organ or systems involvement: Secondary | ICD-10-CM | POA: Diagnosis not present

## 2015-05-28 DIAGNOSIS — M79672 Pain in left foot: Secondary | ICD-10-CM | POA: Diagnosis not present

## 2015-05-28 DIAGNOSIS — M79642 Pain in left hand: Secondary | ICD-10-CM | POA: Diagnosis not present

## 2015-05-28 DIAGNOSIS — Z79899 Other long term (current) drug therapy: Secondary | ICD-10-CM | POA: Diagnosis not present

## 2015-05-28 DIAGNOSIS — M79641 Pain in right hand: Secondary | ICD-10-CM | POA: Diagnosis not present

## 2015-07-17 DIAGNOSIS — Z79899 Other long term (current) drug therapy: Secondary | ICD-10-CM | POA: Diagnosis not present

## 2015-07-25 DIAGNOSIS — M1990 Unspecified osteoarthritis, unspecified site: Secondary | ICD-10-CM | POA: Diagnosis not present

## 2015-07-25 DIAGNOSIS — E785 Hyperlipidemia, unspecified: Secondary | ICD-10-CM | POA: Diagnosis not present

## 2015-07-25 DIAGNOSIS — I10 Essential (primary) hypertension: Secondary | ICD-10-CM | POA: Diagnosis not present

## 2015-07-25 DIAGNOSIS — M069 Rheumatoid arthritis, unspecified: Secondary | ICD-10-CM | POA: Diagnosis not present

## 2015-08-20 DIAGNOSIS — M79641 Pain in right hand: Secondary | ICD-10-CM | POA: Diagnosis not present

## 2015-08-20 DIAGNOSIS — M79642 Pain in left hand: Secondary | ICD-10-CM | POA: Diagnosis not present

## 2015-09-03 DIAGNOSIS — M0579 Rheumatoid arthritis with rheumatoid factor of multiple sites without organ or systems involvement: Secondary | ICD-10-CM | POA: Diagnosis not present

## 2015-09-03 DIAGNOSIS — M170 Bilateral primary osteoarthritis of knee: Secondary | ICD-10-CM | POA: Diagnosis not present

## 2015-09-03 DIAGNOSIS — Z09 Encounter for follow-up examination after completed treatment for conditions other than malignant neoplasm: Secondary | ICD-10-CM | POA: Diagnosis not present

## 2015-09-03 DIAGNOSIS — M19271 Secondary osteoarthritis, right ankle and foot: Secondary | ICD-10-CM | POA: Diagnosis not present

## 2015-09-18 DIAGNOSIS — Z79899 Other long term (current) drug therapy: Secondary | ICD-10-CM | POA: Diagnosis not present

## 2015-10-31 DIAGNOSIS — E785 Hyperlipidemia, unspecified: Secondary | ICD-10-CM | POA: Diagnosis not present

## 2015-10-31 DIAGNOSIS — I10 Essential (primary) hypertension: Secondary | ICD-10-CM | POA: Diagnosis not present

## 2015-10-31 DIAGNOSIS — M1990 Unspecified osteoarthritis, unspecified site: Secondary | ICD-10-CM | POA: Diagnosis not present

## 2015-11-19 DIAGNOSIS — Z79899 Other long term (current) drug therapy: Secondary | ICD-10-CM | POA: Diagnosis not present

## 2015-12-16 DIAGNOSIS — M25512 Pain in left shoulder: Secondary | ICD-10-CM | POA: Diagnosis not present

## 2015-12-16 DIAGNOSIS — M174 Other bilateral secondary osteoarthritis of knee: Secondary | ICD-10-CM | POA: Diagnosis not present

## 2015-12-16 DIAGNOSIS — Z09 Encounter for follow-up examination after completed treatment for conditions other than malignant neoplasm: Secondary | ICD-10-CM | POA: Diagnosis not present

## 2015-12-16 DIAGNOSIS — M0579 Rheumatoid arthritis with rheumatoid factor of multiple sites without organ or systems involvement: Secondary | ICD-10-CM | POA: Diagnosis not present

## 2016-01-15 DIAGNOSIS — Z79899 Other long term (current) drug therapy: Secondary | ICD-10-CM | POA: Diagnosis not present

## 2016-01-15 DIAGNOSIS — Z9225 Personal history of immunosupression therapy: Secondary | ICD-10-CM | POA: Diagnosis not present

## 2016-03-24 DIAGNOSIS — Z79899 Other long term (current) drug therapy: Secondary | ICD-10-CM | POA: Diagnosis not present

## 2016-04-07 ENCOUNTER — Ambulatory Visit (INDEPENDENT_AMBULATORY_CARE_PROVIDER_SITE_OTHER): Payer: Medicare Other | Admitting: Rheumatology

## 2016-04-07 DIAGNOSIS — Z79899 Other long term (current) drug therapy: Secondary | ICD-10-CM

## 2016-04-07 DIAGNOSIS — M545 Low back pain: Secondary | ICD-10-CM

## 2016-04-07 DIAGNOSIS — M19071 Primary osteoarthritis, right ankle and foot: Secondary | ICD-10-CM

## 2016-04-07 DIAGNOSIS — M0579 Rheumatoid arthritis with rheumatoid factor of multiple sites without organ or systems involvement: Secondary | ICD-10-CM | POA: Diagnosis not present

## 2016-04-07 DIAGNOSIS — M170 Bilateral primary osteoarthritis of knee: Secondary | ICD-10-CM

## 2016-04-23 LAB — TB SKIN TEST
Induration: 0 mm
TB SKIN TEST: NEGATIVE

## 2016-04-26 ENCOUNTER — Encounter: Payer: Self-pay | Admitting: Rheumatology

## 2016-04-26 DIAGNOSIS — Z111 Encounter for screening for respiratory tuberculosis: Secondary | ICD-10-CM

## 2016-05-21 DIAGNOSIS — I10 Essential (primary) hypertension: Secondary | ICD-10-CM | POA: Diagnosis not present

## 2016-05-21 DIAGNOSIS — M1990 Unspecified osteoarthritis, unspecified site: Secondary | ICD-10-CM | POA: Diagnosis not present

## 2016-05-21 DIAGNOSIS — E785 Hyperlipidemia, unspecified: Secondary | ICD-10-CM | POA: Diagnosis not present

## 2016-06-08 ENCOUNTER — Other Ambulatory Visit: Payer: Self-pay | Admitting: Rheumatology

## 2016-06-08 DIAGNOSIS — Z79899 Other long term (current) drug therapy: Secondary | ICD-10-CM | POA: Diagnosis not present

## 2016-06-08 LAB — CBC WITH DIFFERENTIAL/PLATELET
BASOS ABS: 0 {cells}/uL (ref 0–200)
Basophils Relative: 0 %
EOS PCT: 1 %
Eosinophils Absolute: 81 cells/uL (ref 15–500)
HEMATOCRIT: 38.6 % (ref 35.0–45.0)
Hemoglobin: 12.6 g/dL (ref 11.7–15.5)
LYMPHS ABS: 2997 {cells}/uL (ref 850–3900)
Lymphocytes Relative: 37 %
MCH: 32.2 pg (ref 27.0–33.0)
MCHC: 32.6 g/dL (ref 32.0–36.0)
MCV: 98.7 fL (ref 80.0–100.0)
MPV: 10 fL (ref 7.5–12.5)
Monocytes Absolute: 567 cells/uL (ref 200–950)
Monocytes Relative: 7 %
NEUTROS ABS: 4455 {cells}/uL (ref 1500–7800)
Neutrophils Relative %: 55 %
Platelets: 344 10*3/uL (ref 140–400)
RBC: 3.91 MIL/uL (ref 3.80–5.10)
RDW: 14.9 % (ref 11.0–15.0)
WBC: 8.1 10*3/uL (ref 3.8–10.8)

## 2016-06-08 LAB — COMPLETE METABOLIC PANEL WITH GFR
ALK PHOS: 83 U/L (ref 33–130)
ALT: 12 U/L (ref 6–29)
AST: 14 U/L (ref 10–35)
Albumin: 4.3 g/dL (ref 3.6–5.1)
BUN: 12 mg/dL (ref 7–25)
CO2: 27 mmol/L (ref 20–31)
Calcium: 9.4 mg/dL (ref 8.6–10.4)
Chloride: 108 mmol/L (ref 98–110)
Creat: 1.01 mg/dL — ABNORMAL HIGH (ref 0.50–0.99)
GFR, EST NON AFRICAN AMERICAN: 57 mL/min — AB (ref >=60)
GFR, Est African American: 66 mL/min (ref >=60)
Glucose, Bld: 89 mg/dL (ref 65–99)
POTASSIUM: 4.3 mmol/L (ref 3.5–5.3)
SODIUM: 142 mmol/L (ref 135–146)
Total Bilirubin: 0.2 mg/dL (ref 0.2–1.2)
Total Protein: 6.8 g/dL (ref 6.1–8.1)

## 2016-06-09 NOTE — Progress Notes (Signed)
Decrease methotrexate 2.6 mL subcutaneously per week. Recheck BMP with GFR in one month

## 2016-06-10 ENCOUNTER — Telehealth: Payer: Self-pay | Admitting: Pharmacist

## 2016-06-10 ENCOUNTER — Other Ambulatory Visit: Payer: Self-pay | Admitting: *Deleted

## 2016-06-10 DIAGNOSIS — Z79899 Other long term (current) drug therapy: Secondary | ICD-10-CM

## 2016-06-10 NOTE — Telephone Encounter (Signed)
Patient came by the office with her portion of the patient assistance application.  The full application was faxed to the [employer] patient assistance program.

## 2016-06-10 NOTE — Telephone Encounter (Signed)
Patient brought by paperwork for [facility] Patient Assistance Application renewal for [employer].    Last visit: 04/07/16 Next visit: 09/06/16 Labs: 06/08/16 - CBC normal, CMP with Cr 1.01, GFR 66 TB: PPD negative 05/06/16  Okay to renew [employer] patient assistance program for 2018 per Dr. Estanislado Pandy.  Form was signed by Dr. Estanislado Pandy.  I called patient to update her.  She plans to bring by her portion of the application.  Will fax the full application once she brings her portion.    [name], Pharm.D., BCPS Clinical Pharmacist Pager: [phone] Phone: [phone] 06/10/2016 8:37 AM

## 2016-07-02 NOTE — Progress Notes (Signed)
Received a fax of approval from [facility] patient assistance for patients [employer]. She has been approved through 06-20-17.  Spoke with patient who voiced understanding.   Will send document to scan center.  Jean [name], [place], CPhT

## 2016-07-13 ENCOUNTER — Other Ambulatory Visit: Payer: Self-pay | Admitting: Rheumatology

## 2016-07-13 DIAGNOSIS — Z79899 Other long term (current) drug therapy: Secondary | ICD-10-CM | POA: Diagnosis not present

## 2016-07-13 LAB — BASIC METABOLIC PANEL WITH GFR
BUN: 12 mg/dL (ref 7–25)
CO2: 22 mmol/L (ref 20–31)
Calcium: 9 mg/dL (ref 8.6–10.4)
Chloride: 107 mmol/L (ref 98–110)
Creat: 0.81 mg/dL (ref 0.50–0.99)
GFR, EST NON AFRICAN AMERICAN: 75 mL/min (ref >=60)
GFR, Est African American: 86 mL/min (ref >=60)
GLUCOSE: 117 mg/dL — AB (ref 65–99)
Potassium: 3.9 mmol/L (ref 3.5–5.3)
Sodium: 139 mmol/L (ref 135–146)

## 2016-07-13 NOTE — Progress Notes (Signed)
CMP normal

## 2016-08-20 DIAGNOSIS — I10 Essential (primary) hypertension: Secondary | ICD-10-CM | POA: Diagnosis not present

## 2016-08-20 DIAGNOSIS — E785 Hyperlipidemia, unspecified: Secondary | ICD-10-CM | POA: Diagnosis not present

## 2016-08-20 DIAGNOSIS — M1990 Unspecified osteoarthritis, unspecified site: Secondary | ICD-10-CM | POA: Diagnosis not present

## 2016-09-06 ENCOUNTER — Ambulatory Visit (INDEPENDENT_AMBULATORY_CARE_PROVIDER_SITE_OTHER): Payer: Medicare Other | Admitting: Rheumatology

## 2016-09-06 ENCOUNTER — Encounter: Payer: Self-pay | Admitting: Rheumatology

## 2016-09-06 VITALS — BP 130/72 | HR 68 | Resp 14 | Ht 63.0 in | Wt 130.0 lb

## 2016-09-06 DIAGNOSIS — M19079 Primary osteoarthritis, unspecified ankle and foot: Secondary | ICD-10-CM

## 2016-09-06 DIAGNOSIS — R5383 Other fatigue: Secondary | ICD-10-CM | POA: Diagnosis not present

## 2016-09-06 DIAGNOSIS — M170 Bilateral primary osteoarthritis of knee: Secondary | ICD-10-CM

## 2016-09-06 DIAGNOSIS — Z79899 Other long term (current) drug therapy: Secondary | ICD-10-CM

## 2016-09-06 DIAGNOSIS — M19042 Primary osteoarthritis, left hand: Secondary | ICD-10-CM | POA: Diagnosis not present

## 2016-09-06 DIAGNOSIS — M19041 Primary osteoarthritis, right hand: Secondary | ICD-10-CM

## 2016-09-06 DIAGNOSIS — D75839 Thrombocytosis, unspecified: Secondary | ICD-10-CM

## 2016-09-06 DIAGNOSIS — E559 Vitamin D deficiency, unspecified: Secondary | ICD-10-CM | POA: Diagnosis not present

## 2016-09-06 DIAGNOSIS — D473 Essential (hemorrhagic) thrombocythemia: Secondary | ICD-10-CM | POA: Diagnosis not present

## 2016-09-06 DIAGNOSIS — M0609 Rheumatoid arthritis without rheumatoid factor, multiple sites: Secondary | ICD-10-CM | POA: Diagnosis not present

## 2016-09-06 LAB — CBC WITH DIFFERENTIAL/PLATELET
BASOS PCT: 0 %
Basophils Absolute: 0 cells/uL (ref 0–200)
EOS ABS: 0 {cells}/uL — AB (ref 15–500)
Eosinophils Relative: 0 %
HCT: 41 % (ref 35.0–45.0)
HEMOGLOBIN: 13.3 g/dL (ref 11.7–15.5)
LYMPHS ABS: 2626 {cells}/uL (ref 850–3900)
Lymphocytes Relative: 26 %
MCH: 32.5 pg (ref 27.0–33.0)
MCHC: 32.4 g/dL (ref 32.0–36.0)
MCV: 100.2 fL — AB (ref 80.0–100.0)
MONO ABS: 909 {cells}/uL (ref 200–950)
MONOS PCT: 9 %
MPV: 9.5 fL (ref 7.5–12.5)
NEUTROS ABS: 6565 {cells}/uL (ref 1500–7800)
Neutrophils Relative %: 65 %
PLATELETS: 404 10*3/uL — AB (ref 140–400)
RBC: 4.09 MIL/uL (ref 3.80–5.10)
RDW: 14.4 % (ref 11.0–15.0)
WBC: 10.1 10*3/uL (ref 3.8–10.8)

## 2016-09-06 LAB — COMPLETE METABOLIC PANEL WITH GFR
ALBUMIN: 4.4 g/dL (ref 3.6–5.1)
ALT: 36 U/L — AB (ref 6–29)
AST: 24 U/L (ref 10–35)
Alkaline Phosphatase: 114 U/L (ref 33–130)
BUN: 12 mg/dL (ref 7–25)
CO2: 25 mmol/L (ref 20–31)
Calcium: 9.8 mg/dL (ref 8.6–10.4)
Chloride: 107 mmol/L (ref 98–110)
Creat: 0.64 mg/dL (ref 0.50–0.99)
GFR, Est African American: >89 mL/min (ref >=60)
GFR, Est Non African American: >89 mL/min (ref >=60)
GLUCOSE: 82 mg/dL (ref 65–99)
Potassium: 4.7 mmol/L (ref 3.5–5.3)
Sodium: 141 mmol/L (ref 135–146)
Total Bilirubin: 0.4 mg/dL (ref 0.2–1.2)
Total Protein: 7.1 g/dL (ref 6.1–8.1)

## 2016-09-06 MED ORDER — METHOTREXATE SODIUM CHEMO INJECTION 50 MG/2ML: 15.0000 mg | INTRAMUSCULAR | 0 refills | Status: DC

## 2016-09-06 MED ORDER — ADALIMUMAB 40 MG/0.8ML ~~LOC~~ AJKT: 1.0000 "pen " | AUTO-INJECTOR | SUBCUTANEOUS | 0 refills | Status: DC

## 2016-09-06 MED ORDER — FOLIC ACID 1 MG PO TABS: 2.0000 mg | ORAL_TABLET | Freq: Every day | ORAL | 4 refills | Status: DC

## 2016-09-06 NOTE — Progress Notes (Signed)
Rheumatology Medication Review by a Pharmacist Does the patient feel that his/her medications are working for him/her?  Yes Has the patient been experiencing any side effects to the medications prescribed?  No Does the patient have any problems obtaining medications?  No  Issues to address at subsequent visits: None   Pharmacist comments:  Jean [name] is a pleasant 71 yo F who presents for follow up of her rheumatoid arthritis.  She is currently taking Humira every other week, methotrexate 0.6 mL weekly, and folic acid 2 mg daily.  Her most recent standing labs were on 06/08/16.  She is due for standing labs today.  Most recent TB test was a PPD on 05/06/16 which was negative.  She will be due for repeat TB test in November 2018.  Patient denies any questions or concerns regarding her medications at this time.    [name], Pharm.D., BCPS, CPP Clinical Pharmacist Pager: [phone] Phone: [phone] 09/06/2016 9:51 AM

## 2016-09-06 NOTE — Patient Instructions (Signed)
Standing Labs We placed an order today for your standing lab work.    Please come back and get your standing labs in June 2018 and every 3 months.    We have open lab Monday through Friday from 8:30-11:30 AM and 1:30-4 PM at the office of Dr. Shaili Deveshwar/Naitik Panwala, PA.   The office is located at [address], [address], [place], [place] [address] No appointment is necessary.   Labs are drawn by [facility].  You may receive a bill from [facility] for your lab work.     

## 2016-09-06 NOTE — Progress Notes (Signed)
Office Visit Note  Patient: Jean [name]             Date of Birth: 1945-11-02           MRN: [id number]             PCP: No PCP Per Patient Referring: No ref. provider found Visit Date: 09/06/2016 Occupation: @GUAROCC @    Subjective:  Pain of the Right Hip; Pain of the Left Hip; and Follow-up   History of Present Illness: Jean [name] is a 71 y.o. female  Last seen 04/07/2016. She has seronegative rheumatoid arthritis and is doing well. Taking Humira every 2 weeks And methotrexate 0.8 ML's every week Folic acid 2 mg daily. She has a history of TB indeterminate in the past and therefore goes to the health department and gets her PPD done. She recently did a PPD approximately 04/23/2016. She brought [place] a copy of that negative PPD. She'll get a repeat one annually.  Patient's main complaint today is that she feels like her stamina is decreased overall. She also states that the back of her thighs hurt from time to time and she is "out of energy". She has not discussed this with her PCP yet. Offered her several labs that we can do and then if appropriate we'll refer them back to the PCP for treatment. Patient is agreeable. Specifically, I'll order thyroid panel, vitamin D, CBC with differential and CMP with GFR. Patient is agreeable.   Activities of Daily Living:  Patient reports morning stiffness for 15 minutes.   Patient Denies nocturnal pain.  Difficulty dressing/grooming: Denies Difficulty climbing stairs: Denies Difficulty getting out of chair: Reports Difficulty using hands for taps, buttons, cutlery, and/or writing: Denies   Review of Systems  Constitutional: Negative for fatigue.  HENT: Negative for mouth sores and mouth dryness.   Eyes: Negative for dryness.  Respiratory: Negative for shortness of breath.   Gastrointestinal: Negative for constipation and diarrhea.  Musculoskeletal: Negative for myalgias and myalgias.  Skin: Negative for sensitivity  to sunlight.  Psychiatric/Behavioral: Negative for decreased concentration and sleep disturbance.    PMFS History:  Patient Active Problem List   Diagnosis Date Noted  . Screening-pulmonary TB 04/26/2016    Past Medical History:  Diagnosis Date  . Arthritis   . Hypertension     No family history on file. Past Surgical History:  Procedure Laterality Date  . ECTOPIC PREGNANCY SURGERY    . EXTERNAL EAR SURGERY Right    removal of cyst or gland   Social History   Social History Narrative  . No narrative on file     Objective: Vital Signs: BP 130/72   Pulse 68   Resp 14   Ht 5\' 3"  (1.6 m)   Wt 130 lb (59 kg)   BMI 23.03 kg/m    Physical Exam  Constitutional: She is oriented to person, place, and time. She appears well-developed and well-nourished.  HENT:  Head: Normocephalic and atraumatic.  Eyes: EOM are normal. Pupils are equal, round, and reactive to light.  Cardiovascular: Normal rate, regular rhythm and normal heart sounds.  Exam reveals no gallop and no friction rub.   No murmur heard. Pulmonary/Chest: Effort normal and breath sounds normal. She has no wheezes. She has no rales.  Abdominal: Soft. Bowel sounds are normal. She exhibits no distension. There is no tenderness. There is no guarding. No hernia.  Musculoskeletal: Normal range of motion. She exhibits no edema, tenderness or deformity.  Lymphadenopathy:  She has no cervical adenopathy.  Neurological: She is alert and oriented to person, place, and time. Coordination normal.  Skin: Skin is warm and dry. Capillary refill takes less than 2 seconds. No rash noted.  Psychiatric: She has a normal mood and affect. Her behavior is normal.  Nursing note and vitals reviewed.    Musculoskeletal Exam:    CDAI Exam: CDAI Homunculus Exam:   Joint Counts:  CDAI Tender Joint count: 0 CDAI Swollen Joint count: 0  Global Assessments:  Patient Global Assessment: 3 Provider Global Assessment: 3  CDAI  Calculated Score: 6  No synovitis on examination  Investigation: No additional findings.  Orders Only on 07/13/2016  Component Date Value Ref Range Status  . Sodium 07/13/2016 139  135 - 146 mmol/L Final  . Potassium 07/13/2016 3.9  3.5 - 5.3 mmol/L Final  . Chloride 07/13/2016 107  98 - 110 mmol/L Final  . CO2 07/13/2016 22  20 - 31 mmol/L Final  . Glucose, Bld 07/13/2016 117* 65 - 99 mg/dL Final  . BUN 16/03/9603 12  7 - 25 mg/dL Final  . Creat 54/02/8118 0.81  0.50 - 0.99 mg/dL Final   Comment:   For patients > or = 69 years of age: The upper reference limit for Creatinine is approximately 13% higher for people identified as African-American.     . Calcium 07/13/2016 9.0  8.6 - 10.4 mg/dL Final  . GFR, Est African American 07/13/2016 86  >=60 mL/min Final  . GFR, Est Non African American 07/13/2016 75  >=60 mL/min Final  Orders Only on 06/08/2016  Component Date Value Ref Range Status  . Sodium 06/08/2016 142  135 - 146 mmol/L Final  . Potassium 06/08/2016 4.3  3.5 - 5.3 mmol/L Final  . Chloride 06/08/2016 108  98 - 110 mmol/L Final  . CO2 06/08/2016 27  20 - 31 mmol/L Final  . Glucose, Bld 06/08/2016 89  65 - 99 mg/dL Final  . BUN 14/78/2956 12  7 - 25 mg/dL Final  . Creat 21/30/8657 1.01* 0.50 - 0.99 mg/dL Final   Comment:   For patients > or = 69 years of age: The upper reference limit for Creatinine is approximately 13% higher for people identified as African-American.     . Total Bilirubin 06/08/2016 0.2  0.2 - 1.2 mg/dL Final  . Alkaline Phosphatase 06/08/2016 83  33 - 130 U/L Final  . AST 06/08/2016 14  10 - 35 U/L Final  . ALT 06/08/2016 12  6 - 29 U/L Final  . Total Protein 06/08/2016 6.8  6.1 - 8.1 g/dL Final  . Albumin 84/69/6295 4.3  3.6 - 5.1 g/dL Final  . Calcium 28/41/3244 9.4  8.6 - 10.4 mg/dL Final  . GFR, Est African American 06/08/2016 66  >=60 mL/min Final  . GFR, Est Non African American 06/08/2016 57* >=60 mL/min Final  . WBC 06/08/2016 8.1   3.8 - 10.8 K/uL Final  . RBC 06/08/2016 3.91  3.80 - 5.10 MIL/uL Final  . Hemoglobin 06/08/2016 12.6  11.7 - 15.5 g/dL Final  . HCT 06/23/7251 38.6  35.0 - 45.0 % Final  . MCV 06/08/2016 98.7  80.0 - 100.0 fL Final  . MCH 06/08/2016 32.2  27.0 - 33.0 pg Final  . MCHC 06/08/2016 32.6  32.0 - 36.0 g/dL Final  . RDW 66/44/0347 14.9  11.0 - 15.0 % Final  . Platelets 06/08/2016 344  140 - 400 K/uL Final  . MPV 06/08/2016 10.0  7.5 -  12.5 fL Final  . Neutro Abs 06/08/2016 4455  1,500 - 7,800 cells/uL Final  . Lymphs Abs 06/08/2016 2997  850 - 3,900 cells/uL Final  . Monocytes Absolute 06/08/2016 567  200 - 950 cells/uL Final  . Eosinophils Absolute 06/08/2016 81  15 - 500 cells/uL Final  . Basophils Absolute 06/08/2016 0  0 - 200 cells/uL Final  . Neutrophils Relative % 06/08/2016 55  % Final  . Lymphocytes Relative 06/08/2016 37  % Final  . Monocytes Relative 06/08/2016 7  % Final  . Eosinophils Relative 06/08/2016 1  % Final  . Basophils Relative 06/08/2016 0  % Final  . Smear Review 06/08/2016 Criteria for review not met   Final     Imaging: No results found.  Speciality Comments: No specialty comments available.    Procedures:  No procedures performed Allergies: Sulfa antibiotics   Assessment / Plan:     Visit Diagnoses: High risk medication use - 09/06/2016: Humira every 2 weeks; methotrexate 0.8 ML's every week; folic acid 2 mg daily (adequate response). - Plan: CBC with Differential/Platelet, COMPLETE METABOLIC PANEL WITH GFR, CBC with Differential/Platelet, COMPLETE METABOLIC PANEL WITH GFR  Rheumatoid arthritis, seronegative, multiple sites (HCC)  Primary osteoarthritis of both hands  Primary osteoarthritis of both knees  Osteoarthritis of ankle and foot, unspecified laterality  Fatigue, unspecified type - Plan: Thyroid Panel With TSH, VITAMIN D 25 Hydroxy (Vit-D Deficiency, Fractures)  Thrombocytosis (HCC) - Plan: folic acid (FOLVITE) 1 MG tablet   Plan: #1:  Seronegative rheumatoid arthritis. Doing well. No complaint.  #2: High-risk prescription. On Humira every 2 weeks and doing well. On methotrexate 0.8 ML's every week adequate response. Folic acid 2 mg daily.  #3: OA of the hands, OA of the knee joint, OA of the feet. Doing well with some discomfort at times.  #4: Patient has had PPD done at the health department 04/26/2016 and it was abstracted by Amy. It was negative. She will bring by another copy so that we can have it in the patient's chart word seizure defined.  #5: Return to clinic in 5 months  #6: Refill Humira. Patient gets patient assistance. We sign the form so that she can get the next shipment in  #7: Refill methotrexate to prescription throat ninety-day supply of 0.8 ML's per week  #7: Refill folic acid 2 mg daily ninety-day supply with 4 refills  #8: Patient will do so over sneakers for now to start exercising. She's having some long-standing fatigue/decreased stamina. She states that the back of the legs heard in the thigh. Orders: Orders Placed This Encounter  Procedures  . CBC with Differential/Platelet  . COMPLETE METABOLIC PANEL WITH GFR  . Thyroid Panel With TSH  . VITAMIN D 25 Hydroxy (Vit-D Deficiency, Fractures)   Meds ordered this encounter  Medications  . methotrexate 50 MG/2ML injection    Sig: Inject 0.6 mLs (15 mg total) into the skin once a week.    Dispense:  8 mL    Refill:  0  . folic acid (FOLVITE) 1 MG tablet    Sig: Take 2 tablets (2 mg total) by mouth daily.    Dispense:  180 tablet    Refill:  4  . Adalimumab (HUMIRA PEN) 40 MG/0.8ML PNKT    Sig: Inject 1 pen into the skin every 14 (fourteen) days.    Dispense:  3 each    Refill:  0    3 month supply sent in to patient assistance program  Face-to-face time spent with patient was 30 minutes. 50% of time was spent in counseling and coordination of care.  Follow-Up Instructions: Return in about 5 months (around 02/06/2017) for RA,  Humira q 2 wks, MTX 0.73ml, folic 2mf qd, fatigue.   Tawni Pummel, PA-C  Note - This record has been created using [employer].  Chart creation errors have been sought, but may not always  have been located. Such creation errors do not reflect on  the standard of medical care.

## 2016-09-07 LAB — THYROID PANEL WITH TSH
FREE THYROXINE INDEX: 2 (ref 1.4–3.8)
T3 Uptake: 26 % (ref 22–35)
T4 TOTAL: 7.5 ug/dL (ref 4.5–12.0)
TSH: 1.23 mIU/L

## 2016-09-07 LAB — VITAMIN D 25 HYDROXY (VIT D DEFICIENCY, FRACTURES): Vit D, 25-Hydroxy: 15 ng/mL — ABNORMAL LOW (ref 30–100)

## 2016-09-16 ENCOUNTER — Telehealth: Payer: Self-pay | Admitting: *Deleted

## 2016-09-16 MED ORDER — VITAMIN D (ERGOCALCIFEROL) 1.25 MG (50000 UNIT) PO CAPS: 50000.0000 [IU] | ORAL_CAPSULE | ORAL | 0 refills | Status: DC

## 2016-09-16 NOTE — Telephone Encounter (Signed)
-----   Message from Carlos, [place] sent at 09/07/2016  9:07 AM EDT ----- Plan: #1: Have patient get a PCP so we can for labs of them #2: Thyroid panel is within normal limits #3: CMP with GFR is within normal limits except for mild elevation of a LT at 36 #4: CBC with differential is within normal limits #5: Vitamin D is low at 15. Let's treat the low vitamin D Vitamin D 50,000 units Take 1 pill twice a week (on a Wednesday and then on Saturday) Dispense 24 pills with no refill Recheck vitamin D in 3 months after starting the prescription of vitamin D 3

## 2016-12-21 ENCOUNTER — Other Ambulatory Visit: Payer: Self-pay | Admitting: *Deleted

## 2016-12-21 DIAGNOSIS — Z79899 Other long term (current) drug therapy: Secondary | ICD-10-CM | POA: Diagnosis not present

## 2016-12-21 LAB — CBC WITH DIFFERENTIAL/PLATELET
BASOS ABS: 0 {cells}/uL (ref 0–200)
Basophils Relative: 0 %
EOS ABS: 82 {cells}/uL (ref 15–500)
EOS PCT: 1 %
HCT: 37.5 % (ref 35.0–45.0)
Hemoglobin: 12 g/dL (ref 11.7–15.5)
Lymphocytes Relative: 22 %
Lymphs Abs: 1804 cells/uL (ref 850–3900)
MCH: 32.2 pg (ref 27.0–33.0)
MCHC: 32 g/dL (ref 32.0–36.0)
MCV: 100.5 fL — ABNORMAL HIGH (ref 80.0–100.0)
MONOS PCT: 7 %
MPV: 8.8 fL (ref 7.5–12.5)
Monocytes Absolute: 574 cells/uL (ref 200–950)
NEUTROS ABS: 5740 {cells}/uL (ref 1500–7800)
NEUTROS PCT: 70 %
Platelets: 450 10*3/uL — ABNORMAL HIGH (ref 140–400)
RBC: 3.73 MIL/uL — ABNORMAL LOW (ref 3.80–5.10)
RDW: 14.3 % (ref 11.0–15.0)
WBC: 8.2 10*3/uL (ref 3.8–10.8)

## 2016-12-21 LAB — COMPLETE METABOLIC PANEL WITH GFR
ALT: 15 U/L (ref 6–29)
AST: 17 U/L (ref 10–35)
Albumin: 4 g/dL (ref 3.6–5.1)
Alkaline Phosphatase: 99 U/L (ref 33–130)
BILIRUBIN TOTAL: 0.3 mg/dL (ref 0.2–1.2)
BUN: 9 mg/dL (ref 7–25)
CO2: 23 mmol/L (ref 20–31)
Calcium: 9.5 mg/dL (ref 8.6–10.4)
Chloride: 106 mmol/L (ref 98–110)
Creat: 0.72 mg/dL (ref 0.50–0.99)
GFR, EST NON AFRICAN AMERICAN: 86 mL/min (ref >=60)
GLUCOSE: 66 mg/dL (ref 65–99)
Potassium: 4.7 mmol/L (ref 3.5–5.3)
SODIUM: 140 mmol/L (ref 135–146)
TOTAL PROTEIN: 6.8 g/dL (ref 6.1–8.1)

## 2016-12-23 NOTE — Progress Notes (Signed)
WNL

## 2017-01-04 ENCOUNTER — Telehealth: Payer: Self-pay

## 2017-01-04 NOTE — Telephone Encounter (Signed)
Patient called needing a Rx refill on her Humira, but stated that we have to put an order in for the Rx.  Patient stated that it would take longer if it is faxed.  Cb# is [phone].

## 2017-01-04 NOTE — Telephone Encounter (Signed)
Last Visit: 09/06/16 Next Visit: 01/25/17 Labs: 12/21/16 WNL TB Skin Test 04/23/16 Neg  Okay to refill Humira per Dr Estanislado Pandy.

## 2017-01-06 MED ORDER — ADALIMUMAB 40 MG/0.8ML ~~LOC~~ AJKT: 1.0000 "pen " | AUTO-INJECTOR | SUBCUTANEOUS | 0 refills | Status: DC

## 2017-01-06 NOTE — Telephone Encounter (Signed)
Prescription has called to the Assistance program for refill.

## 2017-01-06 NOTE — Addendum Note (Signed)
Addended by: Carole Binning on: 01/06/2017 02:54 PM   Modules accepted: Orders

## 2017-01-12 ENCOUNTER — Other Ambulatory Visit: Payer: Self-pay | Admitting: Cardiology

## 2017-01-12 DIAGNOSIS — Z1231 Encounter for screening mammogram for malignant neoplasm of breast: Secondary | ICD-10-CM

## 2017-01-19 DIAGNOSIS — M19042 Primary osteoarthritis, left hand: Secondary | ICD-10-CM

## 2017-01-19 DIAGNOSIS — Z79899 Other long term (current) drug therapy: Secondary | ICD-10-CM | POA: Insufficient documentation

## 2017-01-19 DIAGNOSIS — M170 Bilateral primary osteoarthritis of knee: Secondary | ICD-10-CM | POA: Insufficient documentation

## 2017-01-19 DIAGNOSIS — D75839 Thrombocytosis, unspecified: Secondary | ICD-10-CM | POA: Insufficient documentation

## 2017-01-19 DIAGNOSIS — M19041 Primary osteoarthritis, right hand: Secondary | ICD-10-CM | POA: Insufficient documentation

## 2017-01-19 DIAGNOSIS — D539 Nutritional anemia, unspecified: Secondary | ICD-10-CM | POA: Insufficient documentation

## 2017-01-19 DIAGNOSIS — M0609 Rheumatoid arthritis without rheumatoid factor, multiple sites: Secondary | ICD-10-CM | POA: Insufficient documentation

## 2017-01-19 DIAGNOSIS — R5383 Other fatigue: Secondary | ICD-10-CM | POA: Insufficient documentation

## 2017-01-19 DIAGNOSIS — M19071 Primary osteoarthritis, right ankle and foot: Secondary | ICD-10-CM | POA: Insufficient documentation

## 2017-01-19 DIAGNOSIS — M19072 Primary osteoarthritis, left ankle and foot: Secondary | ICD-10-CM

## 2017-01-19 DIAGNOSIS — Z862 Personal history of diseases of the blood and blood-forming organs and certain disorders involving the immune mechanism: Secondary | ICD-10-CM

## 2017-01-19 DIAGNOSIS — D473 Essential (hemorrhagic) thrombocythemia: Secondary | ICD-10-CM | POA: Insufficient documentation

## 2017-01-19 NOTE — Progress Notes (Signed)
Office Visit Note  Patient: Jean [name]             Date of Birth: [birth year]           MRN: [id number]             PCP: Charolette Forward, MD Referring: No ref. provider found Visit Date: 01/25/2017 Occupation: _0 @    Subjective:  Right shoulder pain.   History of Present Illness: Jean [name] is a 71 y.o. female with history of seronegative rheumatoid arthritis. She states her arthritis is fairly well controlled with combination of methotrexate and Humira. She states she has off-and-on discomfort in her bilateral shoulders especially as night when she is sleeping on the side. She denies any joint swelling.  Activities of Daily Living:  Patient reports morning stiffness for 30  minutes.   Patient Denies nocturnal pain.  Difficulty dressing/grooming: Denies Difficulty climbing stairs: Denies Difficulty getting out of chair: Denies Difficulty using hands for taps, buttons, cutlery, and/or writing: Denies   Review of Systems  Constitutional: Positive for fatigue. Negative for night sweats, weight gain, weight loss and weakness.  HENT: Negative for mouth sores, trouble swallowing, trouble swallowing, mouth dryness and nose dryness.   Eyes: Positive for dryness. Negative for pain, redness and visual disturbance.  Respiratory: Negative.  Negative for cough, shortness of breath and difficulty breathing.   Cardiovascular: Negative.  Negative for chest pain, palpitations, hypertension, irregular heartbeat and swelling in legs/feet.  Gastrointestinal: Negative.  Negative for blood in stool, constipation and diarrhea.  Endocrine: Negative.  Negative for increased urination.  Genitourinary: Negative.  Negative for nocturia and vaginal dryness.  Musculoskeletal: Positive for arthralgias, joint pain, muscle weakness and morning stiffness. Negative for joint swelling, myalgias, muscle tenderness and myalgias.  Skin: Negative.  Negative for color change, rash, hair loss,  skin tightness, ulcers and sensitivity to sunlight.  Allergic/Immunologic: Negative for susceptible to infections.  Neurological: Negative.  Negative for dizziness, headaches, memory loss and night sweats.  Hematological: Negative.  Negative for swollen glands.  Psychiatric/Behavioral: Negative.  Negative for depressed mood and sleep disturbance. The patient is not nervous/anxious.     PMFS History:  Patient Active Problem List   Diagnosis Date Noted  . Vitamin D deficiency 01/23/2017  . Rheumatoid arthritis, seronegative, multiple sites ([place]) 01/19/2017  . High risk medication use 01/19/2017  . Primary osteoarthritis of both hands 01/19/2017  . Primary osteoarthritis of both knees 01/19/2017  . Fatigue 01/19/2017  . Thrombocytosis ([place]) 01/19/2017  . Primary osteoarthritis of both feet 01/19/2017  . History of anemia 01/19/2017  . Screening-pulmonary TB 04/26/2016    Past Medical History:  Diagnosis Date  . Arthritis   . Hypertension     History reviewed. No pertinent family history. Past Surgical History:  Procedure Laterality Date  . ECTOPIC PREGNANCY SURGERY    . EXTERNAL EAR SURGERY Right    removal of cyst or gland   Social History   Social History Narrative  . No narrative on file     Objective: Vital Signs: BP 107/66   Pulse 98   Resp 12   Ht 5' 3" (1.6 m)   Wt 125 lb (56.7 kg)   BMI 22.14 kg/m    Physical Exam  Constitutional: She is oriented to person, place, and time. She appears well-developed and well-nourished.  HENT:  Head: Normocephalic and atraumatic.  Eyes: Conjunctivae and EOM are normal.  Neck: Normal range of motion.  Cardiovascular: Normal rate, regular rhythm, normal   Office Visit Note  Patient: [name]             Date of Birth: 06/14/1946           MRN: [id number]             PCP: Harwani, Mohan, MD Referring: No ref. provider found Visit Date: 01/25/2017 Occupation: @GUAROCC@    Subjective:  Right shoulder pain.   History of Present Illness: Jean [name] is a 70 y.o. female with history of seronegative rheumatoid arthritis. She states her arthritis is fairly well controlled with combination of methotrexate and Humira. She states she has off-and-on discomfort in her bilateral shoulders especially as night when she is sleeping on the side. She denies any joint swelling.  Activities of Daily Living:  Patient reports morning stiffness for 30  minutes.   Patient Denies nocturnal pain.  Difficulty dressing/grooming: Denies Difficulty climbing stairs: Denies Difficulty getting out of chair: Denies Difficulty using hands for taps, buttons, cutlery, and/or writing: Denies   Review of Systems  Constitutional: Positive for fatigue. Negative for night sweats, weight gain, weight loss and weakness.  HENT: Negative for mouth sores, trouble swallowing, trouble swallowing, mouth dryness and nose dryness.   Eyes: Positive for dryness. Negative for pain, redness and visual disturbance.  Respiratory: Negative.  Negative for cough, shortness of breath and difficulty breathing.   Cardiovascular: Negative.  Negative for chest pain, palpitations, hypertension, irregular heartbeat and swelling in legs/feet.  Gastrointestinal: Negative.  Negative for blood in stool, constipation and diarrhea.  Endocrine: Negative.  Negative for increased urination.  Genitourinary: Negative.  Negative for nocturia and vaginal dryness.  Musculoskeletal: Positive for arthralgias, joint pain, muscle weakness and morning stiffness. Negative for joint swelling, myalgias, muscle tenderness and myalgias.  Skin: Negative.  Negative for color change, rash, hair loss,  skin tightness, ulcers and sensitivity to sunlight.  Allergic/Immunologic: Negative for susceptible to infections.  Neurological: Negative.  Negative for dizziness, headaches, memory loss and night sweats.  Hematological: Negative.  Negative for swollen glands.  Psychiatric/Behavioral: Negative.  Negative for depressed mood and sleep disturbance. The patient is not nervous/anxious.     PMFS History:  Patient Active Problem List   Diagnosis Date Noted  . Vitamin D deficiency 01/23/2017  . Rheumatoid arthritis, seronegative, multiple sites (HCC) 01/19/2017  . High risk medication use 01/19/2017  . Primary osteoarthritis of both hands 01/19/2017  . Primary osteoarthritis of both knees 01/19/2017  . Fatigue 01/19/2017  . Thrombocytosis (HCC) 01/19/2017  . Primary osteoarthritis of both feet 01/19/2017  . History of anemia 01/19/2017  . Screening-pulmonary TB 04/26/2016    Past Medical History:  Diagnosis Date  . Arthritis   . Hypertension     History reviewed. No pertinent family history. Past Surgical History:  Procedure Laterality Date  . ECTOPIC PREGNANCY SURGERY    . EXTERNAL EAR SURGERY Right    removal of cyst or gland   Social History   Social History Narrative  . No narrative on file     Objective: Vital Signs: BP 107/66   Pulse 98   Resp 12   Ht 5' 3" (1.6 m)   Wt 125 lb (56.7 kg)   BMI 22.14 kg/m    Physical Exam  Constitutional: She is oriented to person, place, and time. She appears well-developed and well-nourished.  HENT:  Head: Normocephalic and atraumatic.  Eyes: Conjunctivae and EOM are normal.  Neck: Normal range of motion.  Cardiovascular: Normal rate, regular rhythm, normal    Office Visit Note  Patient: Haeli L Hatton             Date of Birth: 09/12/1945           MRN: [id number]             PCP: Harwani, Mohan, MD Referring: No ref. provider found Visit Date: 01/25/2017 Occupation: @GUAROCC@    Subjective:  Right shoulder pain.   History of Present Illness: Geral L Brackeen is a 70 y.o. female with history of seronegative rheumatoid arthritis. She states her arthritis is fairly well controlled with combination of methotrexate and Humira. She states she has off-and-on discomfort in her bilateral shoulders especially as night when she is sleeping on the side. She denies any joint swelling.  Activities of Daily Living:  Patient reports morning stiffness for 30  minutes.   Patient Denies nocturnal pain.  Difficulty dressing/grooming: Denies Difficulty climbing stairs: Denies Difficulty getting out of chair: Denies Difficulty using hands for taps, buttons, cutlery, and/or writing: Denies   Review of Systems  Constitutional: Positive for fatigue. Negative for night sweats, weight gain, weight loss and weakness.  HENT: Negative for mouth sores, trouble swallowing, trouble swallowing, mouth dryness and nose dryness.   Eyes: Positive for dryness. Negative for pain, redness and visual disturbance.  Respiratory: Negative.  Negative for cough, shortness of breath and difficulty breathing.   Cardiovascular: Negative.  Negative for chest pain, palpitations, hypertension, irregular heartbeat and swelling in legs/feet.  Gastrointestinal: Negative.  Negative for blood in stool, constipation and diarrhea.  Endocrine: Negative.  Negative for increased urination.  Genitourinary: Negative.  Negative for nocturia and vaginal dryness.  Musculoskeletal: Positive for arthralgias, joint pain, muscle weakness and morning stiffness. Negative for joint swelling, myalgias, muscle tenderness and myalgias.  Skin: Negative.  Negative for color change, rash, hair loss,  skin tightness, ulcers and sensitivity to sunlight.  Allergic/Immunologic: Negative for susceptible to infections.  Neurological: Negative.  Negative for dizziness, headaches, memory loss and night sweats.  Hematological: Negative.  Negative for swollen glands.  Psychiatric/Behavioral: Negative.  Negative for depressed mood and sleep disturbance. The patient is not nervous/anxious.     PMFS History:  Patient Active Problem List   Diagnosis Date Noted  . Vitamin D deficiency 01/23/2017  . Rheumatoid arthritis, seronegative, multiple sites (HCC) 01/19/2017  . High risk medication use 01/19/2017  . Primary osteoarthritis of both hands 01/19/2017  . Primary osteoarthritis of both knees 01/19/2017  . Fatigue 01/19/2017  . Thrombocytosis (HCC) 01/19/2017  . Primary osteoarthritis of both feet 01/19/2017  . History of anemia 01/19/2017  . Screening-pulmonary TB 04/26/2016    Past Medical History:  Diagnosis Date  . Arthritis   . Hypertension     History reviewed. No pertinent family history. Past Surgical History:  Procedure Laterality Date  . ECTOPIC PREGNANCY SURGERY    . EXTERNAL EAR SURGERY Right    removal of cyst or gland   Social History   Social History Narrative  . No narrative on file     Objective: Vital Signs: BP 107/66   Pulse 98   Resp 12   Ht 5' 3" (1.6 m)   Wt 125 lb (56.7 kg)   BMI 22.14 kg/m    Physical Exam  Constitutional: She is oriented to person, place, and time. She appears well-developed and well-nourished.  HENT:  Head: Normocephalic and atraumatic.  Eyes: Conjunctivae and EOM are normal.  Neck: Normal range of motion.  Cardiovascular: Normal rate, regular rhythm, normal

## 2017-01-23 DIAGNOSIS — E559 Vitamin D deficiency, unspecified: Secondary | ICD-10-CM | POA: Insufficient documentation

## 2017-01-25 ENCOUNTER — Ambulatory Visit (INDEPENDENT_AMBULATORY_CARE_PROVIDER_SITE_OTHER): Payer: Medicare Other | Admitting: Rheumatology

## 2017-01-25 ENCOUNTER — Encounter: Payer: Self-pay | Admitting: Rheumatology

## 2017-01-25 VITALS — BP 107/66 | HR 98 | Resp 12 | Ht 63.0 in | Wt 125.0 lb

## 2017-01-25 DIAGNOSIS — M19042 Primary osteoarthritis, left hand: Secondary | ICD-10-CM | POA: Diagnosis not present

## 2017-01-25 DIAGNOSIS — D473 Essential (hemorrhagic) thrombocythemia: Secondary | ICD-10-CM

## 2017-01-25 DIAGNOSIS — M19071 Primary osteoarthritis, right ankle and foot: Secondary | ICD-10-CM

## 2017-01-25 DIAGNOSIS — Z862 Personal history of diseases of the blood and blood-forming organs and certain disorders involving the immune mechanism: Secondary | ICD-10-CM

## 2017-01-25 DIAGNOSIS — E559 Vitamin D deficiency, unspecified: Secondary | ICD-10-CM

## 2017-01-25 DIAGNOSIS — M19072 Primary osteoarthritis, left ankle and foot: Secondary | ICD-10-CM

## 2017-01-25 DIAGNOSIS — M19041 Primary osteoarthritis, right hand: Secondary | ICD-10-CM | POA: Diagnosis not present

## 2017-01-25 DIAGNOSIS — M170 Bilateral primary osteoarthritis of knee: Secondary | ICD-10-CM

## 2017-01-25 DIAGNOSIS — M0609 Rheumatoid arthritis without rheumatoid factor, multiple sites: Secondary | ICD-10-CM | POA: Diagnosis not present

## 2017-01-25 DIAGNOSIS — R5383 Other fatigue: Secondary | ICD-10-CM | POA: Diagnosis not present

## 2017-01-25 DIAGNOSIS — Z79899 Other long term (current) drug therapy: Secondary | ICD-10-CM | POA: Diagnosis not present

## 2017-01-25 DIAGNOSIS — D75839 Thrombocytosis, unspecified: Secondary | ICD-10-CM

## 2017-01-25 MED ORDER — "TUBERCULIN-ALLERGY SYRINGES 27G X 1/2"" 1 ML KIT": 1.0000 | PACK | 3 refills | Status: DC

## 2017-01-25 MED ORDER — METHOTREXATE SODIUM CHEMO INJECTION 50 MG/2ML: 15.0000 mg | INTRAMUSCULAR | 0 refills | Status: DC

## 2017-01-25 MED ORDER — FOLIC ACID 1 MG PO TABS: 2.0000 mg | ORAL_TABLET | Freq: Every day | ORAL | 3 refills | Status: DC

## 2017-01-25 NOTE — Patient Instructions (Addendum)
You are due for your TB skin test in November 2018  Standing Labs We placed an order today for your standing lab work.    Please come back and get your standing labs in October 2018 and every 3 months.  We have open lab Monday through Friday from 8:30-11:30 AM and 1:30-4 PM at the office of Dr. Bo Merino.   The office is located at [address], [place], [place], [place] [address] No appointment is necessary.   Labs are drawn by [facility].  You may receive a bill from [facility] for your lab work. If you have any questions regarding directions or hours of operation,  please call [phone].     Shoulder Exercises Ask your health care provider which exercises are safe for you. Do exercises exactly as told by your health care provider and adjust them as directed. It is normal to feel mild stretching, pulling, tightness, or discomfort as you do these exercises, but you should stop right away if you feel sudden pain or your pain gets worse.Do not begin these exercises until told by your health care provider. RANGE OF MOTION EXERCISES These exercises warm up your muscles and joints and improve the movement and flexibility of your shoulder. These exercises also help to relieve pain, numbness, and tingling. These exercises involve stretching your injured shoulder directly. Exercise A: Pendulum  1. Stand near a wall or a surface that you can hold onto for balance. 2. Bend at the waist and let your left / right arm hang straight down. Use your other arm to support you. Keep your back straight and do not lock your knees. 3. Relax your left / right arm and shoulder muscles, and move your hips and your trunk so your left / right arm swings freely. Your arm should swing because of the motion of your body, not because you are using your arm or shoulder muscles. 4. Keep moving your body so your arm swings in the following directions, as told by your health care provider: ? Side to side. ? Forward  and backward. ? In clockwise and counterclockwise circles. 5. Continue each motion for __________ seconds, or for as long as told by your health care provider. 6. Slowly return to the starting position. Repeat __________ times. Complete this exercise __________ times a day. Exercise B:Flexion, Standing  1. Stand and hold a broomstick, a cane, or a similar object. Place your hands a little more than shoulder-width apart on the object. Your left / right hand should be palm-up, and your other hand should be palm-down. 2. Keep your elbow straight and keep your shoulder muscles relaxed. Push the stick down with your healthy arm to raise your left / right arm in front of your body, and then over your head until you feel a stretch in your shoulder. ? Avoid shrugging your shoulder while you raise your arm. Keep your shoulder blade tucked down toward the middle of your back. 3. Hold for __________ seconds. 4. Slowly return to the starting position. Repeat __________ times. Complete this exercise __________ times a day. Exercise C: Abduction, Standing 1. Stand and hold a broomstick, a cane, or a similar object. Place your hands a little more than shoulder-width apart on the object. Your left / right hand should be palm-up, and your other hand should be palm-down. 2. While keeping your elbow straight and your shoulder muscles relaxed, push the stick across your body toward your left / right side. Raise your left / right arm  to the side of your body and then over your head until you feel a stretch in your shoulder. ? Do not raise your arm above shoulder height, unless your health care provider tells you to do that. ? Avoid shrugging your shoulder while you raise your arm. Keep your shoulder blade tucked down toward the middle of your back. 3. Hold for __________ seconds. 4. Slowly return to the starting position. Repeat __________ times. Complete this exercise __________ times a day. Exercise D:Internal  Rotation  1. Place your left / right hand behind your back, palm-up. 2. Use your other hand to dangle an exercise band, a towel, or a similar object over your shoulder. Grasp the band with your left / right hand so you are holding onto both ends. 3. Gently pull up on the band until you feel a stretch in the front of your left / right shoulder. ? Avoid shrugging your shoulder while you raise your arm. Keep your shoulder blade tucked down toward the middle of your back. 4. Hold for __________ seconds. 5. Release the stretch by letting go of the band and lowering your hands. Repeat __________ times. Complete this exercise __________ times a day. STRETCHING EXERCISES These exercises warm up your muscles and joints and improve the movement and flexibility of your shoulder. These exercises also help to relieve pain, numbness, and tingling. These exercises are done using your healthy shoulder to help stretch the muscles of your injured shoulder. Exercise E: Warehouse manager (External Rotation and Abduction)  1. Stand in a doorway with one of your feet slightly in front of the other. This is called a staggered stance. If you cannot reach your forearms to the door frame, stand facing a corner of a room. 2. Choose one of the following positions as told by your health care provider: ? Place your hands and forearms on the door frame above your head. ? Place your hands and forearms on the door frame at the height of your head. ? Place your hands on the door frame at the height of your elbows. 3. Slowly move your weight onto your front foot until you feel a stretch across your chest and in the front of your shoulders. Keep your head and chest upright and keep your abdominal muscles tight. 4. Hold for __________ seconds. 5. To release the stretch, shift your weight to your back foot. Repeat __________ times. Complete this stretch __________ times a day. Exercise F:Extension, Standing 1. Stand and hold a  broomstick, a cane, or a similar object behind your back. ? Your hands should be a little wider than shoulder-width apart. ? Your palms should face away from your back. 2. Keeping your elbows straight and keeping your shoulder muscles relaxed, move the stick away from your body until you feel a stretch in your shoulder. ? Avoid shrugging your shoulders while you move the stick. Keep your shoulder blade tucked down toward the middle of your back. 3. Hold for __________ seconds. 4. Slowly return to the starting position. Repeat __________ times. Complete this exercise __________ times a day. STRENGTHENING EXERCISES These exercises build strength and endurance in your shoulder. Endurance is the ability to use your muscles for a long time, even after they get tired. Exercise G:External Rotation  1. Sit in a stable chair without armrests. 2. Secure an exercise band at elbow height on your left / right side. 3. Place a soft object, such as a folded towel or a small pillow, between your left /  right upper arm and your body to move your elbow a few inches away (about 10 cm) from your side. 4. Hold the end of the band so it is tight and there is no slack. 5. Keeping your elbow pressed against the soft object, move your left / right forearm out, away from your abdomen. Keep your body steady so only your forearm moves. 6. Hold for __________ seconds. 7. Slowly return to the starting position. Repeat __________ times. Complete this exercise __________ times a day. Exercise H:Shoulder Abduction  1. Sit in a stable chair without armrests, or stand. 2. Hold a __________ weight in your left / right hand, or hold an exercise band with both hands. 3. Start with your arms straight down and your left / right palm facing in, toward your body. 4. Slowly lift your left / right hand out to your side. Do not lift your hand above shoulder height unless your health care provider tells you that this is safe. ? Keep  your arms straight. ? Avoid shrugging your shoulder while you do this movement. Keep your shoulder blade tucked down toward the middle of your back. 5. Hold for __________ seconds. 6. Slowly lower your arm, and return to the starting position. Repeat __________ times. Complete this exercise __________ times a day. Exercise I:Shoulder Extension 1. Sit in a stable chair without armrests, or stand. 2. Secure an exercise band to a stable object in front of you where it is at shoulder height. 3. Hold one end of the exercise band in each hand. Your palms should face each other. 4. Straighten your elbows and lift your hands up to shoulder height. 5. Step back, away from the secured end of the exercise band, until the band is tight and there is no slack. 6. Squeeze your shoulder blades together as you pull your hands down to the sides of your thighs. Stop when your hands are straight down by your sides. Do not let your hands go behind your body. 7. Hold for __________ seconds. 8. Slowly return to the starting position. Repeat __________ times. Complete this exercise __________ times a day. Exercise J:Standing Shoulder Row 1. Sit in a stable chair without armrests, or stand. 2. Secure an exercise band to a stable object in front of you so it is at waist height. 3. Hold one end of the exercise band in each hand. Your palms should be in a thumbs-up position. 4. Bend each of your elbows to an "L" shape (about 90 degrees) and keep your upper arms at your sides. 5. Step back until the band is tight and there is no slack. 6. Slowly pull your elbows back behind you. 7. Hold for __________ seconds. 8. Slowly return to the starting position. Repeat __________ times. Complete this exercise __________ times a day. Exercise K:Shoulder Press-Ups  1. Sit in a stable chair that has armrests. Sit upright, with your feet flat on the floor. 2. Put your hands on the armrests so your elbows are bent and your  fingers are pointing forward. Your hands should be about even with the sides of your body. 3. Push down on the armrests and use your arms to lift yourself off of the chair. Straighten your elbows and lift yourself up as much as you comfortably can. ? Move your shoulder blades down, and avoid letting your shoulders move up toward your ears. ? Keep your feet on the ground. As you get stronger, your feet should support less of your body weight  as you lift yourself up. 4. Hold for __________ seconds. 5. Slowly lower yourself back into the chair. Repeat __________ times. Complete this exercise __________ times a day. Exercise L: Wall Push-Ups  1. Stand so you are facing a stable wall. Your feet should be about one arm-length away from the wall. 2. Lean forward and place your palms on the wall at shoulder height. 3. Keep your feet flat on the floor as you bend your elbows and lean forward toward the wall. 4. Hold for __________ seconds. 5. Straighten your elbows to push yourself back to the starting position. Repeat __________ times. Complete this exercise __________ times a day. This information is not intended to replace advice given to you by your health care provider. Make sure you discuss any questions you have with your health care provider. Document Released: 04/21/2005 Document Revised: 03/01/2016 Document Reviewed: 02/16/2015 Elsevier Interactive Patient Education  2018 [employer].

## 2017-01-25 NOTE — Progress Notes (Signed)
Rheumatology Medication Review by a Pharmacist Does the patient feel that his/her medications are working for him/her?  Yes Has the patient been experiencing any side effects to the medications prescribed?  No Does the patient have any problems obtaining medications?  No, patient is enrolled in the Humira patient assistance program  Issues to address at subsequent visits: None   Pharmacist comments:  Jean [name] is a pleasant 71 yo F who presents for follow up.  She is currently taking Humira 40 mg every other week, methotrexate 0.6 mL weekly, and folic acid 2 mg daily.  [name] recent standing labs were on 12/21/16.  She will be due for standing labs again in October 2018 and every 3 months.  Patient has history of indeterminate TB Gold.  Patient gets yearly TB skin test.  [name] recent TB skin test was [facility].  She will be due for TB test again in November 2018.  Patient was given orders for PPD to take tot he health department.  Patient denies any questions or concerns regarding her medications at this time.   Jean [name], Pharm.D., BCPS, CPP Clinical Pharmacist Pager: [phone] Phone: [phone] 01/25/2017 11:50 AM

## 2017-02-18 DIAGNOSIS — M1990 Unspecified osteoarthritis, unspecified site: Secondary | ICD-10-CM | POA: Diagnosis not present

## 2017-02-18 DIAGNOSIS — E785 Hyperlipidemia, unspecified: Secondary | ICD-10-CM | POA: Diagnosis not present

## 2017-02-18 DIAGNOSIS — I10 Essential (primary) hypertension: Secondary | ICD-10-CM | POA: Diagnosis not present

## 2017-03-17 ENCOUNTER — Telehealth: Payer: Self-pay

## 2017-03-17 NOTE — Telephone Encounter (Signed)
Called [facility] Patient Assistance [employer] to check the dates for patient renewal applications for 1607. Spoke to [place] who states that patients who submitted a renewal application from January 2018 to October 1st 2018 will receive a year end form by mail to complete and fax or mail in.(Provider portion not required). If a renewal application has not been submitted, the patient will be required to submit a full application. Patient should receive the document around the first week of November and should return it as soon as possible. If they have not received the form by Thanksgiving, they need to call the foundation. [phone])  Called patient to inform. A renewal application has been submitted so a year end form will be sent for the pt to fill out and send back. No answer, could not leave a message for patient.   Keri Tavella, [place], CPhT 11:35 AM

## 2017-04-05 ENCOUNTER — Other Ambulatory Visit: Payer: Self-pay | Admitting: Rheumatology

## 2017-04-05 ENCOUNTER — Other Ambulatory Visit: Payer: Self-pay

## 2017-04-05 DIAGNOSIS — Z79899 Other long term (current) drug therapy: Secondary | ICD-10-CM | POA: Diagnosis not present

## 2017-04-05 LAB — COMPLETE METABOLIC PANEL WITH GFR
AG RATIO: 1.5 (calc) (ref 1.0–2.5)
ALKALINE PHOSPHATASE (APISO): 104 U/L (ref 33–130)
ALT: 15 U/L (ref 6–29)
AST: 16 U/L (ref 10–35)
Albumin: 4.3 g/dL (ref 3.6–5.1)
BILIRUBIN TOTAL: 0.3 mg/dL (ref 0.2–1.2)
BUN: 8 mg/dL (ref 7–25)
CHLORIDE: 106 mmol/L (ref 98–110)
CO2: 26 mmol/L (ref 20–32)
Calcium: 9.7 mg/dL (ref 8.6–10.4)
Creat: 0.79 mg/dL (ref 0.50–0.99)
GFR, EST AFRICAN AMERICAN: 89 mL/min/{1.73_m2} (ref >=60)
GFR, Est Non African American: 76 mL/min/{1.73_m2} (ref >=60)
Globulin: 2.8 g/dL (calc) (ref 1.9–3.7)
Glucose, Bld: 93 mg/dL (ref 65–99)
POTASSIUM: 4.6 mmol/L (ref 3.5–5.3)
Sodium: 141 mmol/L (ref 135–146)
Total Protein: 7.1 g/dL (ref 6.1–8.1)

## 2017-04-05 LAB — CBC WITH DIFFERENTIAL/PLATELET
Basophils Absolute: 40 cells/uL (ref 0–200)
Basophils Relative: 0.5 %
EOS ABS: 40 {cells}/uL (ref 15–500)
Eosinophils Relative: 0.5 %
HCT: 37.5 % (ref 35.0–45.0)
Hemoglobin: 12.4 g/dL (ref 11.7–15.5)
Lymphs Abs: 1967 cells/uL (ref 850–3900)
MCH: 31.6 pg (ref 27.0–33.0)
MCHC: 33.1 g/dL (ref 32.0–36.0)
MCV: 95.7 fL (ref 80.0–100.0)
MONOS PCT: 8.3 %
MPV: 10.3 fL (ref 7.5–12.5)
NEUTROS PCT: 65.8 %
Neutro Abs: 5198 cells/uL (ref 1500–7800)
PLATELETS: 360 10*3/uL (ref 140–400)
RBC: 3.92 10*6/uL (ref 3.80–5.10)
RDW: 13.5 % (ref 11.0–15.0)
Total Lymphocyte: 24.9 %
WBC mixed population: 656 cells/uL (ref 200–950)
WBC: 7.9 10*3/uL (ref 3.8–10.8)

## 2017-04-05 NOTE — Telephone Encounter (Signed)
Patient needs refill on Humira. Please send into mail order pharmacy.

## 2017-04-05 NOTE — Telephone Encounter (Addendum)
Last visit: 01/25/17 Next visit: 07/05/17 Labs: 12/21/16 WNL TB Gold: 04/23/16 PPD Negative   Patient came in today for labs.  Ok to refill per Dr. Estanislado Pandy.

## 2017-04-06 NOTE — Progress Notes (Signed)
Left lab result on patients voicemail

## 2017-04-06 NOTE — Telephone Encounter (Signed)
Prescription faxed to [facility].

## 2017-04-06 NOTE — Progress Notes (Signed)
WNL

## 2017-05-27 DIAGNOSIS — E785 Hyperlipidemia, unspecified: Secondary | ICD-10-CM | POA: Diagnosis not present

## 2017-05-27 DIAGNOSIS — I10 Essential (primary) hypertension: Secondary | ICD-10-CM | POA: Diagnosis not present

## 2017-05-27 DIAGNOSIS — M1990 Unspecified osteoarthritis, unspecified site: Secondary | ICD-10-CM | POA: Diagnosis not present

## 2017-06-16 ENCOUNTER — Other Ambulatory Visit: Payer: Self-pay | Admitting: Rheumatology

## 2017-06-16 NOTE — Telephone Encounter (Signed)
Last visit: 01/25/2017 Next visit: 07/05/2017 Labs: 04/05/2017 WNL   Okay to refill per Dr. Estanislado Pandy.

## 2017-06-20 ENCOUNTER — Telehealth: Payer: Self-pay | Admitting: Rheumatology

## 2017-06-20 ENCOUNTER — Telehealth: Payer: Self-pay

## 2017-06-20 ENCOUNTER — Other Ambulatory Visit: Payer: Self-pay

## 2017-06-20 DIAGNOSIS — Z79899 Other long term (current) drug therapy: Secondary | ICD-10-CM | POA: Diagnosis not present

## 2017-06-20 LAB — CBC WITH DIFFERENTIAL/PLATELET
BASOS PCT: 0.4 %
Basophils Absolute: 28 cells/uL (ref 0–200)
EOS ABS: 63 {cells}/uL (ref 15–500)
Eosinophils Relative: 0.9 %
HCT: 35.9 % (ref 35.0–45.0)
Hemoglobin: 12 g/dL (ref 11.7–15.5)
Lymphs Abs: 2093 cells/uL (ref 850–3900)
MCH: 32.3 pg (ref 27.0–33.0)
MCHC: 33.4 g/dL (ref 32.0–36.0)
MCV: 96.8 fL (ref 80.0–100.0)
MPV: 9.8 fL (ref 7.5–12.5)
Monocytes Relative: 7.3 %
Neutro Abs: 4305 cells/uL (ref 1500–7800)
Neutrophils Relative %: 61.5 %
PLATELETS: 388 10*3/uL (ref 140–400)
RBC: 3.71 10*6/uL — ABNORMAL LOW (ref 3.80–5.10)
RDW: 13.3 % (ref 11.0–15.0)
TOTAL LYMPHOCYTE: 29.9 %
WBC: 7 10*3/uL (ref 3.8–10.8)
WBCMIX: 511 {cells}/uL (ref 200–950)

## 2017-06-20 LAB — COMPLETE METABOLIC PANEL WITH GFR
AG RATIO: 1.6 (calc) (ref 1.0–2.5)
ALT: 13 U/L (ref 6–29)
AST: 17 U/L (ref 10–35)
Albumin: 4.1 g/dL (ref 3.6–5.1)
Alkaline phosphatase (APISO): 84 U/L (ref 33–130)
BUN: 10 mg/dL (ref 7–25)
CO2: 28 mmol/L (ref 20–32)
Calcium: 9.5 mg/dL (ref 8.6–10.4)
Chloride: 108 mmol/L (ref 98–110)
Creat: 0.93 mg/dL (ref 0.50–0.99)
GFR, Est African American: 73 mL/min/{1.73_m2} (ref >=60)
GFR, Est Non African American: 63 mL/min/{1.73_m2} (ref >=60)
Globulin: 2.5 g/dL (calc) (ref 1.9–3.7)
Glucose, Bld: 85 mg/dL (ref 65–99)
Potassium: 4.5 mmol/L (ref 3.5–5.3)
Sodium: 142 mmol/L (ref 135–146)
Total Bilirubin: 0.4 mg/dL (ref 0.2–1.2)
Total Protein: 6.6 g/dL (ref 6.1–8.1)

## 2017-06-20 NOTE — Telephone Encounter (Signed)
Patient came by clinic to sign paperwork. Application was faxed to foundation. Will update once we receive a response.   [name], [place], CPhT 1:52 PM

## 2017-06-20 NOTE — Telephone Encounter (Signed)
Received a fax from [facility]. Patient will need to fill out a short 3 question application to extend her enrollment for 2019.   Called patient to update. She plans to come by the clinic today to fill out and sign. Will fax application once all is complete Patient voices understanding and denies any questions at this time.   Jesly Hartmann, [place], CPhT 10:00 AM

## 2017-06-20 NOTE — Telephone Encounter (Signed)
Patient has some questions and would like a call back.  CB# [phone]

## 2017-06-20 NOTE — Telephone Encounter (Signed)
Returned pts call. She will not need to bring her copy of the application. She plans be come in at 1pm to sign paperwork for patient assistance Dina Rich).

## 2017-06-21 NOTE — Progress Notes (Signed)
Office Visit Note  Patient: Jean [name]             Date of Birth: September 05, 1945           MRN: [id number]             PCP: [name], MD Referring: [name], MD Visit Date: 07/05/2017 Occupation: @GUAROCC @    Subjective:  Medication monitoring    History of Present Illness: Jean [name] is a 72 y.o. female with history of seronegative rheumatoid arthritis and osteoarthritis.  Patient states she continues to take MTX 0.6 ml weekly, folic acid 2 mg daily, and Humira 40 mg every 2 weeks.  Patient states these medications have been a good combination.  She denies any joint pain or joint swelling at this time.  She states that her knees and feet are doing well. She states that her morning stiffness has decreased. She states that her fatigue has improved significantly.  She denies any insomnia.    Activities of Daily Living:  Patient reports morning stiffness for 30 minutes.   Patient Denies nocturnal pain.  Difficulty dressing/grooming: Denies Difficulty climbing stairs: Denies Difficulty getting out of chair: Denies Difficulty using hands for taps, buttons, cutlery, and/or writing: Denies   Review of Systems  Constitutional: Negative for fatigue and weakness.  HENT: Negative for mouth sores, mouth dryness and nose dryness.   Eyes: Negative for dryness.  Respiratory: Negative for cough and shortness of breath.   Cardiovascular: Negative for chest pain, palpitations, hypertension and swelling in legs/feet.  Gastrointestinal: Negative for blood in stool, constipation and diarrhea.  Endocrine: Negative for increased urination.  Genitourinary: Negative for painful urination.  Musculoskeletal: Positive for morning stiffness. Negative for arthralgias, joint pain, joint swelling, myalgias, muscle weakness, muscle tenderness and myalgias.  Skin: Negative for color change, rash, hair loss, nodules/bumps, skin tightness, ulcers and sensitivity to sunlight.    Neurological: Negative for dizziness, numbness and headaches.  Hematological: Negative for swollen glands.  Psychiatric/Behavioral: Negative for depressed mood and sleep disturbance.    PMFS History:  Patient Active Problem List   Diagnosis Date Noted  . Vitamin D deficiency 01/23/2017  . Rheumatoid arthritis, seronegative, multiple sites (HCC) 01/19/2017  . High risk medication use 01/19/2017  . Primary osteoarthritis of both hands 01/19/2017  . Primary osteoarthritis of both knees 01/19/2017  . Fatigue 01/19/2017  . Thrombocytosis (HCC) 01/19/2017  . Primary osteoarthritis of both feet 01/19/2017  . History of anemia 01/19/2017  . Screening-pulmonary TB 04/26/2016    Past Medical History:  Diagnosis Date  . Arthritis   . Hypertension     History reviewed. No pertinent family history. Past Surgical History:  Procedure Laterality Date  . ECTOPIC PREGNANCY SURGERY    . EXTERNAL EAR SURGERY Right    removal of cyst or gland   Social History   Social History Narrative  . Not on file     Objective: Vital Signs: BP 134/74 (BP Location: Left Arm, Patient Position: Sitting, Cuff Size: Normal)   Pulse 76   Ht 5\' 4"  (1.626 m)   Wt 124 lb (56.2 kg)   BMI 21.28 kg/m    Physical Exam  Constitutional: She is oriented to person, place, and time. She appears well-developed and well-nourished.  HENT:  Head: Normocephalic and atraumatic.  Eyes: Conjunctivae and EOM are normal.  Neck: Normal range of motion.  Cardiovascular: Normal rate, regular rhythm, normal heart sounds and intact distal pulses.  Pulmonary/Chest: Effort normal and breath sounds  normal.  Abdominal: Soft. Bowel sounds are normal.  Lymphadenopathy:    She has no cervical adenopathy.  Neurological: She is alert and oriented to person, place, and time.  Skin: Skin is warm and dry. Capillary refill takes less than 2 seconds.  Psychiatric: She has a normal mood and affect. Her behavior is normal.  Nursing note  and vitals reviewed.    Musculoskeletal Exam: C-spine, thoracic, and lumbar spine good ROM.  Shoulder joints, elbow joints, wrist joints, MCPs, PIPs, and DIPs good ROM with no synovitis.  Complete fist formation.  Hip joints, knee joints, ankle joints, MTPs, PIPs, and DIPs good ROM with no synovitis.  Right knee crepitus. No warmth or effusion.  No trochanteric bursitis.  No SI joint tenderness or midline spinal tenderness.    CDAI Exam: CDAI Homunculus Exam:   Joint Counts:  CDAI Tender Joint count: 0 CDAI Swollen Joint count: 0  Global Assessments:  Patient Global Assessment: 3 Provider Global Assessment: 3  CDAI Calculated Score: 6    Investigation: No additional findings.TB Skin test: 04/23/2016 Negative  CBC Latest Ref Rng & Units 06/20/2017 04/05/2017 12/21/2016  WBC 3.8 - 10.8 Thousand/uL 7.0 7.9 8.2  Hemoglobin 11.7 - 15.5 g/dL 16.1 09.6 04.5  Hematocrit 35.0 - 45.0 % 35.9 37.5 37.5  Platelets 140 - 400 Thousand/uL 388 360 450(H)   CMP Latest Ref Rng & Units 06/20/2017 04/05/2017 12/21/2016  Glucose 65 - 99 mg/dL 85 93 66  BUN 7 - 25 mg/dL 10 8 9   Creatinine 0.50 - 0.99 mg/dL 4.09 8.11 9.14  Sodium 135 - 146 mmol/L 142 141 140  Potassium 3.5 - 5.3 mmol/L 4.5 4.6 4.7  Chloride 98 - 110 mmol/L 108 106 106  CO2 20 - 32 mmol/L 28 26 23   Calcium 8.6 - 10.4 mg/dL 9.5 9.7 9.5  Total Protein 6.1 - 8.1 g/dL 6.6 7.1 6.8  Total Bilirubin 0.2 - 1.2 mg/dL 0.4 0.3 0.3  Alkaline Phos 33 - 130 U/L - - 99  AST 10 - 35 U/L 17 16 17   ALT 6 - 29 U/L 13 15 15     Imaging: No results found.  Speciality Comments: No specialty comments available.    Procedures:  No procedures performed Allergies: Sulfa antibiotics   Assessment / Plan:     Visit Diagnoses: Rheumatoid arthritis, seronegative, multiple sites [facility]): She has no synovitis on exam.  She has no discomfort, and she feels like MTX 0.6 ml, folic acid 2 mg, and Humira 40 mg every 2 weeks have been a good combination for her.   She will continue on this treatment regimen.     High risk medication use - Methotrexate 0.6 ml sq qweek, Folic acid 2mg  po qd and Humira 40 mg sq q week -CBC and CMP were performed on 06/20/17.  She was given a order today for to have a TB skin test performed.  Plan: TB Skin Test  Primary osteoarthritis of both hands: Mild PIP and DIP synovial thickening consistent with osteoarthritis on exam.    Primary osteoarthritis of both knees: She right knee crepitus with full ROM.  No warmth or effusion.  She has no discomfort at this time.    Primary osteoarthritis of both feet: She has no discomfort in her feet at this time.  She wears proper fitting shoes daily.    Thrombocytosis (HCC): Plts WNL with most recent labs on 06/20/17.  Other fatigue: Improved.  She has significantly more energy.  History of vitamin D deficiency:  She is no longer taking vitamin D supplements.   History of anemia    Orders: No orders of the defined types were placed in this encounter.  Meds ordered this encounter  Medications  . DISCONTD: Adalimumab (HUMIRA PEN) 40 MG/0.8ML PNKT    Sig: Inject 1 pen into the skin every 14 (fourteen) days.    Dispense:  3 each    Refill:  0    3 month supply sent in to patient assistance program    Follow-Up Instructions: Return in about 5 months (around 12/03/2017) for Rheumatoid arthritis.   Pollyann Savoy, MD  Note - This record has been created using Animal nutritionist.  Chart creation errors have been sought, but may not always  have been located. Such creation errors do not reflect on  the standard of medical care.

## 2017-06-23 ENCOUNTER — Telehealth: Payer: Self-pay

## 2017-06-23 NOTE — Telephone Encounter (Signed)
Received a fax from [facility] Patient Assistance stating that pt has been re-enrolled into the program to receive HUMIRA from the foundation free of charge through 06/20/2018.  Will send document to scan center.   Called patient to update. Patient voices understanding and denies any questions at this time.   Lashaya Kienitz, [place], CPhT 3:23 PM

## 2017-07-01 ENCOUNTER — Other Ambulatory Visit: Payer: Self-pay | Admitting: *Deleted

## 2017-07-01 NOTE — Telephone Encounter (Signed)
Last visit: 01/25/2017 Next visit: 07/05/2017 Labs: 04/05/2017 WNL  Tb Skin Test: 04/2016  Okay to refill per Dr. Estanislado Pandy.

## 2017-07-05 ENCOUNTER — Encounter: Payer: Self-pay | Admitting: Rheumatology

## 2017-07-05 ENCOUNTER — Ambulatory Visit: Payer: Medicare Other | Admitting: Rheumatology

## 2017-07-05 VITALS — BP 134/74 | HR 76 | Ht 64.0 in | Wt 124.0 lb

## 2017-07-05 DIAGNOSIS — R5383 Other fatigue: Secondary | ICD-10-CM | POA: Diagnosis not present

## 2017-07-05 DIAGNOSIS — M19041 Primary osteoarthritis, right hand: Secondary | ICD-10-CM

## 2017-07-05 DIAGNOSIS — M0609 Rheumatoid arthritis without rheumatoid factor, multiple sites: Secondary | ICD-10-CM | POA: Diagnosis not present

## 2017-07-05 DIAGNOSIS — M19042 Primary osteoarthritis, left hand: Secondary | ICD-10-CM | POA: Diagnosis not present

## 2017-07-05 DIAGNOSIS — D473 Essential (hemorrhagic) thrombocythemia: Secondary | ICD-10-CM

## 2017-07-05 DIAGNOSIS — M19072 Primary osteoarthritis, left ankle and foot: Secondary | ICD-10-CM

## 2017-07-05 DIAGNOSIS — Z79899 Other long term (current) drug therapy: Secondary | ICD-10-CM | POA: Diagnosis not present

## 2017-07-05 DIAGNOSIS — D75839 Thrombocytosis, unspecified: Secondary | ICD-10-CM

## 2017-07-05 DIAGNOSIS — Z8639 Personal history of other endocrine, nutritional and metabolic disease: Secondary | ICD-10-CM | POA: Diagnosis not present

## 2017-07-05 DIAGNOSIS — M19071 Primary osteoarthritis, right ankle and foot: Secondary | ICD-10-CM

## 2017-07-05 DIAGNOSIS — M170 Bilateral primary osteoarthritis of knee: Secondary | ICD-10-CM

## 2017-07-05 DIAGNOSIS — Z862 Personal history of diseases of the blood and blood-forming organs and certain disorders involving the immune mechanism: Secondary | ICD-10-CM | POA: Diagnosis not present

## 2017-07-05 MED ORDER — ADALIMUMAB 40 MG/0.8ML ~~LOC~~ AJKT: 1.0000 "pen " | AUTO-INJECTOR | SUBCUTANEOUS | 0 refills | Status: DC

## 2017-10-05 ENCOUNTER — Other Ambulatory Visit: Payer: Self-pay

## 2017-10-05 ENCOUNTER — Other Ambulatory Visit: Payer: Self-pay | Admitting: *Deleted

## 2017-10-05 DIAGNOSIS — Z79899 Other long term (current) drug therapy: Secondary | ICD-10-CM

## 2017-10-06 LAB — CBC WITH DIFFERENTIAL/PLATELET
BASOS PCT: 0.6 %
Basophils Absolute: 42 cells/uL (ref 0–200)
EOS ABS: 49 {cells}/uL (ref 15–500)
Eosinophils Relative: 0.7 %
HEMATOCRIT: 36.2 % (ref 35.0–45.0)
Hemoglobin: 12.1 g/dL (ref 11.7–15.5)
LYMPHS ABS: 2205 {cells}/uL (ref 850–3900)
MCH: 32.2 pg (ref 27.0–33.0)
MCHC: 33.4 g/dL (ref 32.0–36.0)
MCV: 96.3 fL (ref 80.0–100.0)
MPV: 10.1 fL (ref 7.5–12.5)
Monocytes Relative: 8.7 %
NEUTROS PCT: 58.5 %
Neutro Abs: 4095 cells/uL (ref 1500–7800)
Platelets: 357 10*3/uL (ref 140–400)
RBC: 3.76 10*6/uL — ABNORMAL LOW (ref 3.80–5.10)
RDW: 12.7 % (ref 11.0–15.0)
TOTAL LYMPHOCYTE: 31.5 %
WBC: 7 10*3/uL (ref 3.8–10.8)
WBCMIX: 609 {cells}/uL (ref 200–950)

## 2017-10-06 LAB — COMPLETE METABOLIC PANEL WITH GFR
AG Ratio: 2 (calc) (ref 1.0–2.5)
ALBUMIN MSPROF: 4.1 g/dL (ref 3.6–5.1)
ALKALINE PHOSPHATASE (APISO): 99 U/L (ref 33–130)
ALT: 12 U/L (ref 6–29)
AST: 12 U/L (ref 10–35)
BUN: 11 mg/dL (ref 7–25)
CO2: 27 mmol/L (ref 20–32)
CREATININE: 0.85 mg/dL (ref 0.50–0.99)
Calcium: 9.3 mg/dL (ref 8.6–10.4)
Chloride: 107 mmol/L (ref 98–110)
GFR, Est African American: 81 mL/min/{1.73_m2} (ref >=60)
GFR, Est Non African American: 70 mL/min/{1.73_m2} (ref >=60)
GLUCOSE: 82 mg/dL (ref 65–99)
Globulin: 2.1 g/dL (calc) (ref 1.9–3.7)
Potassium: 4.1 mmol/L (ref 3.5–5.3)
Sodium: 140 mmol/L (ref 135–146)
Total Bilirubin: 0.3 mg/dL (ref 0.2–1.2)
Total Protein: 6.2 g/dL (ref 6.1–8.1)

## 2017-11-02 ENCOUNTER — Telehealth: Payer: Self-pay

## 2017-11-02 NOTE — Telephone Encounter (Signed)
Received a letter from [employer] patient assistance stating that pt has been approved to receive assistance through the end of the year(06/20/2018). They have requested info directly from the pt to review their eligibility and confirm enrollment in the program.   We are required to complete the prescriber prescription and certification page of the application. Will complete form and fax back after prescriber has signed.   Will send document to scan center.  Honestie Kulik 8:44 AM

## 2017-11-03 NOTE — Telephone Encounter (Signed)
Received a fax from [employer] stating that they received the pts application for pt for assistance with Humira. They will complete the review shortly and will contact us with results.   Will update one we receive a response.   Gino Garrabrant, [place], CPhT 8:16 AM

## 2017-11-21 ENCOUNTER — Other Ambulatory Visit: Payer: Self-pay | Admitting: Rheumatology

## 2017-11-21 DIAGNOSIS — D473 Essential (hemorrhagic) thrombocythemia: Secondary | ICD-10-CM

## 2017-11-21 DIAGNOSIS — D75839 Thrombocytosis, unspecified: Secondary | ICD-10-CM

## 2017-11-21 NOTE — Telephone Encounter (Signed)
Last Visit: 07/05/17 Next visit: 12/15/17  Okay to refill per Dr. Estanislado Pandy

## 2017-12-09 ENCOUNTER — Other Ambulatory Visit: Payer: Self-pay | Admitting: *Deleted

## 2017-12-09 DIAGNOSIS — Z79899 Other long term (current) drug therapy: Secondary | ICD-10-CM | POA: Diagnosis not present

## 2017-12-09 NOTE — Progress Notes (Signed)
Office Visit Note  Patient: Jean [name]             Date of Birth: 06-27-1945           MRN: [id number]             PCP: [name], MD Referring: [name], MD Visit Date: 12/15/2017 Occupation: @GUAROCC @    Subjective:  Medication management.   History of Present Illness: Jean [name] is a 72 y.o. female with history of seronegative rheumatoid arthritis and osteoarthritis.  She has been doing well on combination of methotrexate and Humira.  She denies any joint swelling.  She has been going through some stressful.  As her husband has been diagnosed with an resectable prostate cancer.  Activities of Daily Living:  Patient reports morning stiffness for 1 hour.   Patient Denies nocturnal pain.  Difficulty dressing/grooming: Denies Difficulty climbing stairs: Denies Difficulty getting out of chair: Denies Difficulty using hands for taps, buttons, cutlery, and/or writing: Denies   Review of Systems  Constitutional: Negative for fatigue.  HENT: Negative for mouth sores and mouth dryness.   Eyes: Negative for dryness.  Respiratory: Negative for shortness of breath and difficulty breathing.   Cardiovascular: Negative for chest pain and swelling in legs/feet.  Gastrointestinal: Negative for abdominal pain, constipation and diarrhea.  Endocrine: Negative for increased urination.  Genitourinary: Negative for pelvic pain.  Musculoskeletal: Positive for arthralgias, joint pain and morning stiffness. Negative for joint swelling.  Skin: Negative for rash.  Allergic/Immunologic: Negative for susceptible to infections.  Neurological: Negative for dizziness, light-headedness, headaches, memory loss and weakness.  Hematological: Negative for bruising/bleeding tendency.  Psychiatric/Behavioral: Negative for confusion.    PMFS History:  Patient Active Problem List   Diagnosis Date Noted  . Vitamin D deficiency 01/23/2017  . Rheumatoid arthritis, seronegative,  multiple sites (HCC) 01/19/2017  . High risk medication use 01/19/2017  . Primary osteoarthritis of both hands 01/19/2017  . Primary osteoarthritis of both knees 01/19/2017  . Fatigue 01/19/2017  . Thrombocytosis (HCC) 01/19/2017  . Primary osteoarthritis of both feet 01/19/2017  . History of anemia 01/19/2017  . Screening-pulmonary TB 04/26/2016    Past Medical History:  Diagnosis Date  . Arthritis   . Hypertension     History reviewed. No pertinent family history. Past Surgical History:  Procedure Laterality Date  . ECTOPIC PREGNANCY SURGERY    . EXTERNAL EAR SURGERY Right    removal of cyst or gland   Social History   Social History Narrative  . Not on file     Objective: Vital Signs: BP 131/86 (BP Location: Right Arm, Patient Position: Sitting, Cuff Size: Normal)   Pulse 89   Resp 15   Ht 5\' 3"  (1.6 m)   Wt 123 lb (55.8 kg)   BMI 21.79 kg/m    Physical Exam  Constitutional: She is oriented to person, place, and time. She appears well-developed and well-nourished.  HENT:  Head: Normocephalic and atraumatic.  Eyes: Conjunctivae and EOM are normal.  Neck: Normal range of motion.  Cardiovascular: Normal rate, regular rhythm, normal heart sounds and intact distal pulses.  Pulmonary/Chest: Effort normal and breath sounds normal.  Abdominal: Soft. Bowel sounds are normal.  Lymphadenopathy:    She has no cervical adenopathy.  Neurological: She is alert and oriented to person, place, and time.  Skin: Skin is warm and dry. Capillary refill takes less than 2 seconds.  Psychiatric: She has a normal mood and affect. Her behavior is normal.  Nursing note and vitals reviewed.    Musculoskeletal Exam: C-spine thoracic lumbar spine good range of motion.  Shoulder joints elbow joints wrist joint MCPs PIPs DIPs were in good range of motion with no synovitis.  She does have some osteoarthritic changes with DIP PIP thickening in her hands and feet.  She has some crepitus in her  knee joints without any warmth swelling or effusion.  CDAI Exam: CDAI Homunculus Exam:   Joint Counts:  CDAI Tender Joint count: 0 CDAI Swollen Joint count: 0  Global Assessments:  Patient Global Assessment: 2 Provider Global Assessment: 2  CDAI Calculated Score: 4    Investigation: No additional findings.PPD: 07/08/2017 Negative  CBC Latest Ref Rng & Units 12/09/2017 10/05/2017 06/20/2017  WBC 3.8 - 10.8 Thousand/uL 6.9 7.0 7.0  Hemoglobin 11.7 - 15.5 g/dL 16.1 09.6 04.5  Hematocrit 35.0 - 45.0 % 35.5 36.2 35.9  Platelets 140 - 400 Thousand/uL 334 357 388   CMP Latest Ref Rng & Units 12/09/2017 10/05/2017 06/20/2017  Glucose 65 - 99 mg/dL 78 82 85  BUN 7 - 25 mg/dL 10 11 10   Creatinine 0.50 - 0.99 mg/dL 4.09 8.11 9.14  Sodium 135 - 146 mmol/L 140 140 142  Potassium 3.5 - 5.3 mmol/L 4.6 4.1 4.5  Chloride 98 - 110 mmol/L 108 107 108  CO2 20 - 32 mmol/L 26 27 28   Calcium 8.6 - 10.4 mg/dL 9.4 9.3 9.5  Total Protein 6.1 - 8.1 g/dL 6.5 6.2 6.6  Total Bilirubin 0.2 - 1.2 mg/dL 0.4 0.3 0.4  Alkaline Phos 33 - 130 U/L - - -  AST 10 - 35 U/L 14 12 17   ALT 6 - 29 U/L 11 12 13     Imaging: No results found.  Speciality Comments: PPD- 07/08/17 Negative    Procedures:  No procedures performed Allergies: Sulfa antibiotics   Assessment / Plan:     Visit Diagnoses: Rheumatoid arthritis, seronegative, multiple sites (HCC)-patient has no active synovitis on examination.  We discussed possibly spacing Humira by couple of days and see how she does.  High risk medication use - Methotrexate 0.6 ml sq qweek, Folic acid 2mg  po qd and Humira 40 mg sq q week.  Her labs have been stable she will continue on current regimen.  We will continue to monitor labs every 3 months.  Primary osteoarthritis of both hands-joint protection muscle strengthening discussed.  Primary osteoarthritis of both knees-she is currently not having much discomfort.  Primary osteoarthritis of both feet-proper  fitting shoes were discussed.  She is going through rough time as her husband has been diagnosed with a inoperable prostate cancer.  He is going through chemotherapy.  Other medical problems are listed as follows:  Thrombocytosis (HCC)  Other fatigue  History of anemia  History of vitamin D deficiency    Orders: No orders of the defined types were placed in this encounter.  No orders of the defined types were placed in this encounter.     Follow-Up Instructions: Return in about 5 months (around 05/17/2018) for Rheumatoid arthritis, Osteoarthritis.   Pollyann Savoy, MD  Note - This record has been created using Animal nutritionist.  Chart creation errors have been sought, but may not always  have been located. Such creation errors do not reflect on  the standard of medical care.

## 2017-12-10 LAB — CBC WITH DIFFERENTIAL/PLATELET
Basophils Absolute: 41 cells/uL (ref 0–200)
Basophils Relative: 0.6 %
EOS PCT: 0.9 %
Eosinophils Absolute: 62 cells/uL (ref 15–500)
HEMATOCRIT: 35.5 % (ref 35.0–45.0)
HEMOGLOBIN: 12 g/dL (ref 11.7–15.5)
LYMPHS ABS: 2022 {cells}/uL (ref 850–3900)
MCH: 32.6 pg (ref 27.0–33.0)
MCHC: 33.8 g/dL (ref 32.0–36.0)
MCV: 96.5 fL (ref 80.0–100.0)
MPV: 9.9 fL (ref 7.5–12.5)
Monocytes Relative: 7.7 %
NEUTROS ABS: 4244 {cells}/uL (ref 1500–7800)
Neutrophils Relative %: 61.5 %
Platelets: 334 10*3/uL (ref 140–400)
RBC: 3.68 10*6/uL — AB (ref 3.80–5.10)
RDW: 12.8 % (ref 11.0–15.0)
Total Lymphocyte: 29.3 %
WBC mixed population: 531 cells/uL (ref 200–950)
WBC: 6.9 10*3/uL (ref 3.8–10.8)

## 2017-12-10 LAB — COMPLETE METABOLIC PANEL WITH GFR
AG Ratio: 1.7 (calc) (ref 1.0–2.5)
ALBUMIN MSPROF: 4.1 g/dL (ref 3.6–5.1)
ALT: 11 U/L (ref 6–29)
AST: 14 U/L (ref 10–35)
Alkaline phosphatase (APISO): 88 U/L (ref 33–130)
BILIRUBIN TOTAL: 0.4 mg/dL (ref 0.2–1.2)
BUN: 10 mg/dL (ref 7–25)
CHLORIDE: 108 mmol/L (ref 98–110)
CO2: 26 mmol/L (ref 20–32)
Calcium: 9.4 mg/dL (ref 8.6–10.4)
Creat: 0.84 mg/dL (ref 0.50–0.99)
GFR, EST AFRICAN AMERICAN: 82 mL/min/{1.73_m2} (ref >=60)
GFR, Est Non African American: 71 mL/min/{1.73_m2} (ref >=60)
GLOBULIN: 2.4 g/dL (ref 1.9–3.7)
Glucose, Bld: 78 mg/dL (ref 65–99)
Potassium: 4.6 mmol/L (ref 3.5–5.3)
SODIUM: 140 mmol/L (ref 135–146)
TOTAL PROTEIN: 6.5 g/dL (ref 6.1–8.1)

## 2017-12-11 NOTE — Progress Notes (Signed)
WNLs

## 2017-12-14 DIAGNOSIS — I10 Essential (primary) hypertension: Secondary | ICD-10-CM | POA: Diagnosis not present

## 2017-12-14 DIAGNOSIS — M1990 Unspecified osteoarthritis, unspecified site: Secondary | ICD-10-CM | POA: Diagnosis not present

## 2017-12-14 DIAGNOSIS — E785 Hyperlipidemia, unspecified: Secondary | ICD-10-CM | POA: Diagnosis not present

## 2017-12-15 ENCOUNTER — Ambulatory Visit: Payer: Medicare Other | Admitting: Rheumatology

## 2017-12-15 ENCOUNTER — Encounter: Payer: Self-pay | Admitting: Rheumatology

## 2017-12-15 VITALS — BP 131/86 | HR 89 | Resp 15 | Ht 63.0 in | Wt 123.0 lb

## 2017-12-15 DIAGNOSIS — M0609 Rheumatoid arthritis without rheumatoid factor, multiple sites: Secondary | ICD-10-CM | POA: Diagnosis not present

## 2017-12-15 DIAGNOSIS — M19042 Primary osteoarthritis, left hand: Secondary | ICD-10-CM

## 2017-12-15 DIAGNOSIS — M19072 Primary osteoarthritis, left ankle and foot: Secondary | ICD-10-CM

## 2017-12-15 DIAGNOSIS — Z862 Personal history of diseases of the blood and blood-forming organs and certain disorders involving the immune mechanism: Secondary | ICD-10-CM

## 2017-12-15 DIAGNOSIS — M19071 Primary osteoarthritis, right ankle and foot: Secondary | ICD-10-CM | POA: Diagnosis not present

## 2017-12-15 DIAGNOSIS — D75839 Thrombocytosis, unspecified: Secondary | ICD-10-CM

## 2017-12-15 DIAGNOSIS — D473 Essential (hemorrhagic) thrombocythemia: Secondary | ICD-10-CM | POA: Diagnosis not present

## 2017-12-15 DIAGNOSIS — M19041 Primary osteoarthritis, right hand: Secondary | ICD-10-CM | POA: Diagnosis not present

## 2017-12-15 DIAGNOSIS — Z8639 Personal history of other endocrine, nutritional and metabolic disease: Secondary | ICD-10-CM | POA: Diagnosis not present

## 2017-12-15 DIAGNOSIS — Z79899 Other long term (current) drug therapy: Secondary | ICD-10-CM | POA: Diagnosis not present

## 2017-12-15 DIAGNOSIS — R5383 Other fatigue: Secondary | ICD-10-CM | POA: Diagnosis not present

## 2017-12-15 DIAGNOSIS — M170 Bilateral primary osteoarthritis of knee: Secondary | ICD-10-CM

## 2017-12-15 NOTE — Patient Instructions (Signed)
Standing Labs We placed an order today for your standing lab work.    Please come back and get your standing labs in September and every 3 months   We have open lab Monday through Friday from 8:30-11:30 AM and 1:30-4:00 PM  at the office of Dr. Vaniah Chambers.   You may experience shorter wait times on Monday and Friday afternoons. The office is located at [address], [address], [place], [place] [address] No appointment is necessary.   Labs are drawn by [facility].  You may receive a bill from [facility] for your lab work. If you have any questions regarding directions or hours of operation,  please call [phone].    

## 2018-01-17 ENCOUNTER — Other Ambulatory Visit: Payer: Self-pay | Admitting: *Deleted

## 2018-01-17 NOTE — Telephone Encounter (Signed)
Refill request received via fax  Last visit: 12/15/17 Next Visit: 05/22/18 Labs: 12/09/17 WNL PPD 07/05/17 Neg   Okay to refill per Dr. Estanislado Pandy

## 2018-01-23 ENCOUNTER — Other Ambulatory Visit: Payer: Self-pay | Admitting: Rheumatology

## 2018-01-23 NOTE — Telephone Encounter (Signed)
Last visit: 12/15/2017 Next visit: 05/22/2018 Labs: 12/09/2017 WNL   Okay to refill per Dr. Estanislado Pandy.

## 2018-02-21 ENCOUNTER — Other Ambulatory Visit: Payer: Self-pay | Admitting: Rheumatology

## 2018-02-21 DIAGNOSIS — D75839 Thrombocytosis, unspecified: Secondary | ICD-10-CM

## 2018-02-21 DIAGNOSIS — D473 Essential (hemorrhagic) thrombocythemia: Secondary | ICD-10-CM

## 2018-02-22 NOTE — Telephone Encounter (Signed)
Last visit: 12/15/2017 Next visit: 05/22/2018  Okay to refill per Dr. Estanislado Pandy.

## 2018-03-04 ENCOUNTER — Other Ambulatory Visit: Payer: Self-pay | Admitting: Rheumatology

## 2018-03-04 DIAGNOSIS — D473 Essential (hemorrhagic) thrombocythemia: Secondary | ICD-10-CM

## 2018-03-04 DIAGNOSIS — D75839 Thrombocytosis, unspecified: Secondary | ICD-10-CM

## 2018-03-17 ENCOUNTER — Other Ambulatory Visit: Payer: Self-pay

## 2018-03-17 DIAGNOSIS — Z79899 Other long term (current) drug therapy: Secondary | ICD-10-CM | POA: Diagnosis not present

## 2018-03-17 DIAGNOSIS — M069 Rheumatoid arthritis, unspecified: Secondary | ICD-10-CM | POA: Diagnosis not present

## 2018-03-17 DIAGNOSIS — M1990 Unspecified osteoarthritis, unspecified site: Secondary | ICD-10-CM | POA: Diagnosis not present

## 2018-03-17 DIAGNOSIS — I10 Essential (primary) hypertension: Secondary | ICD-10-CM | POA: Diagnosis not present

## 2018-03-17 DIAGNOSIS — E785 Hyperlipidemia, unspecified: Secondary | ICD-10-CM | POA: Diagnosis not present

## 2018-03-18 LAB — COMPLETE METABOLIC PANEL WITH GFR
AG RATIO: 1.7 (calc) (ref 1.0–2.5)
ALBUMIN MSPROF: 4.3 g/dL (ref 3.6–5.1)
ALKALINE PHOSPHATASE (APISO): 87 U/L (ref 33–130)
ALT: 11 U/L (ref 6–29)
AST: 14 U/L (ref 10–35)
BILIRUBIN TOTAL: 0.3 mg/dL (ref 0.2–1.2)
BUN: 11 mg/dL (ref 7–25)
CO2: 23 mmol/L (ref 20–32)
Calcium: 9.5 mg/dL (ref 8.6–10.4)
Chloride: 106 mmol/L (ref 98–110)
Creat: 0.68 mg/dL (ref 0.60–0.93)
GFR, EST AFRICAN AMERICAN: 103 mL/min/{1.73_m2} (ref >=60)
GFR, Est Non African American: 89 mL/min/{1.73_m2} (ref >=60)
Globulin: 2.5 g/dL (calc) (ref 1.9–3.7)
Glucose, Bld: 85 mg/dL (ref 65–99)
POTASSIUM: 4.7 mmol/L (ref 3.5–5.3)
Sodium: 141 mmol/L (ref 135–146)
TOTAL PROTEIN: 6.8 g/dL (ref 6.1–8.1)

## 2018-03-18 LAB — LIPID PANEL
CHOLESTEROL: 223 mg/dL — AB (ref <200)
HDL: 72 mg/dL (ref >50)
LDL CHOLESTEROL (CALC): 133 mg/dL — AB
Non-HDL Cholesterol (Calc): 151 mg/dL (calc) — ABNORMAL HIGH (ref <130)
Total CHOL/HDL Ratio: 3.1 (calc) (ref <5.0)
Triglycerides: 85 mg/dL (ref <150)

## 2018-03-18 LAB — CBC WITH DIFFERENTIAL/PLATELET
BASOS ABS: 18 {cells}/uL (ref 0–200)
BASOS PCT: 0.3 %
EOS PCT: 1.2 %
Eosinophils Absolute: 72 cells/uL (ref 15–500)
HEMATOCRIT: 35.6 % (ref 35.0–45.0)
HEMOGLOBIN: 11.7 g/dL (ref 11.7–15.5)
LYMPHS ABS: 1812 {cells}/uL (ref 850–3900)
MCH: 32.1 pg (ref 27.0–33.0)
MCHC: 32.9 g/dL (ref 32.0–36.0)
MCV: 97.5 fL (ref 80.0–100.0)
MPV: 9.7 fL (ref 7.5–12.5)
Monocytes Relative: 6.8 %
NEUTROS ABS: 3690 {cells}/uL (ref 1500–7800)
Neutrophils Relative %: 61.5 %
Platelets: 327 10*3/uL (ref 140–400)
RBC: 3.65 10*6/uL — ABNORMAL LOW (ref 3.80–5.10)
RDW: 13.5 % (ref 11.0–15.0)
Total Lymphocyte: 30.2 %
WBC mixed population: 408 cells/uL (ref 200–950)
WBC: 6 10*3/uL (ref 3.8–10.8)

## 2018-03-18 LAB — HEPATIC FUNCTION PANEL
AG Ratio: 1.7 (calc) (ref 1.0–2.5)
ALBUMIN MSPROF: 4.3 g/dL (ref 3.6–5.1)
ALT: 11 U/L (ref 6–29)
AST: 14 U/L (ref 10–35)
Alkaline phosphatase (APISO): 87 U/L (ref 33–130)
BILIRUBIN TOTAL: 0.3 mg/dL (ref 0.2–1.2)
Bilirubin, Direct: 0.1 mg/dL (ref 0.0–0.2)
GLOBULIN: 2.5 g/dL (ref 1.9–3.7)
Indirect Bilirubin: 0.2 mg/dL (calc) (ref 0.2–1.2)
TOTAL PROTEIN: 6.8 g/dL (ref 6.1–8.1)

## 2018-03-20 NOTE — Progress Notes (Signed)
LDL is elevated . Please, notify patient and fax results to her PCP.

## 2018-04-12 ENCOUNTER — Telehealth: Payer: Self-pay | Admitting: Pharmacy Technician

## 2018-04-12 NOTE — Telephone Encounter (Signed)
Discussed renewal application for Humira via [place] and spoke with patient about required information. Patient has application and will bring to office next week.  3:28 PM Beatriz Chancellor, CPhT

## 2018-04-20 ENCOUNTER — Other Ambulatory Visit: Payer: Self-pay

## 2018-04-20 NOTE — Patient Outreach (Signed)
[facility]) Care Management  04/20/2018  Jean [name] Jun 14, 1946 [id number]   Medication Adherence call to Jean [name] spoke with patient she is still taking  Losartan 100 mg she still has medication and does not need any at this time patient will call it in to [facility] when finished. [name] is showing past due under [name].    [facility] Management Direct Dial [phone]  Fax [phone] [name]

## 2018-04-22 ENCOUNTER — Other Ambulatory Visit: Payer: Self-pay | Admitting: Rheumatology

## 2018-04-24 NOTE — Telephone Encounter (Signed)
Last visit: 12/15/2017 Next visit: 05/22/2018 Labs: 03/17/18 cbc/cmp stable  Okay to refill per Dr. Estanislado Pandy

## 2018-05-08 NOTE — Progress Notes (Signed)
Office Visit Note  Patient: Jean [name]             Date of Birth: 09/02/1945           MRN: [id number]             PCP: [name], MD Referring: [name], MD Visit Date: 05/22/2018 Occupation: @GUAROCC @  Subjective:  Right hand stiffness    History of Present Illness: Jean [name] is a 72 y.o. female with history of seronegative rheumatoid arthritis and osteoarthritis.  She is currently on treatment regimen of Humira 40 mg subcutaneous injections every 21 days and methotrexate 0.6 mg once weekly.  She has not missed any doses recently.  She has not had any recent infections.  She had her seasonal influenza vaccination.  She states that she has been under tremendous amount of stress due to her husband being in hospice.  She has noticed some increased stiffness and discomfort in the right hand which improves as the day goes on.  She states her morning stiffness has been lasting about 1 hour.  She states she does note some right thumb numbness at the beginning of number which is been improving.  She denies any injuries.  She denies any other joint pain or joint swelling at this time.  She has been walking more frequently and has noticed less fatigue. She is planning on going to the health department in January for yearly PPD.  She does not need any refills of her medications at this time  Activities of Daily Living:  Patient reports morning stiffness for  1 hour.   Patient Denies nocturnal pain.  Difficulty dressing/grooming: Denies Difficulty climbing stairs: Denies Difficulty getting out of chair: Denies Difficulty using hands for taps, buttons, cutlery, and/or writing: Denies  Review of Systems  Constitutional: Negative for fatigue.  HENT: Negative for mouth sores, mouth dryness and nose dryness.   Eyes: Positive for dryness. Negative for pain and visual disturbance.  Respiratory: Negative for cough, hemoptysis, shortness of breath and difficulty breathing.    Cardiovascular: Negative for chest pain, palpitations, hypertension and swelling in legs/feet.  Gastrointestinal: Negative for blood in stool, constipation and diarrhea.  Endocrine: Negative for increased urination.  Genitourinary: Negative for painful urination.  Musculoskeletal: Positive for arthralgias, joint pain and morning stiffness. Negative for joint swelling, myalgias, muscle weakness, muscle tenderness and myalgias.  Skin: Negative for color change, pallor, rash, hair loss, nodules/bumps, skin tightness, ulcers and sensitivity to sunlight.  Allergic/Immunologic: Negative for susceptible to infections.  Neurological: Negative for dizziness, numbness, headaches and weakness.  Hematological: Negative for swollen glands.  Psychiatric/Behavioral: Positive for depressed mood (Situational). Negative for sleep disturbance. The patient is not nervous/anxious.     PMFS History:  Patient Active Problem List   Diagnosis Date Noted  . Vitamin D deficiency 01/23/2017  . Rheumatoid arthritis, seronegative, multiple sites (HCC) 01/19/2017  . High risk medication use 01/19/2017  . Primary osteoarthritis of both hands 01/19/2017  . Primary osteoarthritis of both knees 01/19/2017  . Fatigue 01/19/2017  . Thrombocytosis (HCC) 01/19/2017  . Primary osteoarthritis of both feet 01/19/2017  . History of anemia 01/19/2017  . Screening-pulmonary TB 04/26/2016    Past Medical History:  Diagnosis Date  . Arthritis   . Hypertension     Family History  Problem Relation Age of Onset  . Hypertension Daughter    Past Surgical History:  Procedure Laterality Date  . ECTOPIC PREGNANCY SURGERY    . EXTERNAL EAR SURGERY  Right    removal of cyst or gland   Social History   Social History Narrative  . Not on file    Objective: Vital Signs: BP 124/76 (BP Location: Left Arm, Patient Position: Sitting, Cuff Size: Normal)   Pulse 80   Resp 12   Ht 5\' 3"  (1.6 m)   Wt 122 lb (55.3 kg)   BMI 21.61  kg/m    Physical Exam  Constitutional: She is oriented to person, place, and time. She appears well-developed and well-nourished.  HENT:  Head: Normocephalic and atraumatic.  Eyes: Conjunctivae and EOM are normal.  Neck: Normal range of motion.  Cardiovascular: Normal rate, regular rhythm, normal heart sounds and intact distal pulses.  Pulmonary/Chest: Effort normal and breath sounds normal.  Abdominal: Soft. Bowel sounds are normal.  Lymphadenopathy:    She has no cervical adenopathy.  Neurological: She is alert and oriented to person, place, and time.  Skin: Skin is warm and dry. Capillary refill takes less than 2 seconds.  Psychiatric: She has a normal mood and affect. Her behavior is normal.  Nursing note and vitals reviewed.    Musculoskeletal Exam: C-spine, thoracic spine, and lumbar spine good ROM with no discomfort.  No midline spinal tenderness.  No SI joint tenderness.  Shoulder joints, elbow joints, wrist joints, MCPs, PIPs, and DIPs good ROM with no synovitis.  Tenderness of rigth 2nd and 3rd MCPs and right 2nd PIP.  Hip joints, knee joints, ankle joints, MTPs, PIPs, and DIPs good ROM with no synovitis.  No warmth or effusion of knee joints.  No tenderness or swelling of ankle joints.  No tenderness of trochanteric bursa bilaterally.   CDAI Exam: CDAI Score: 3.6  Patient Global Assessment: 4 (mm); Provider Global Assessment: 2 (mm) Swollen: 0 ; Tender: 3  Joint Exam      Right  Left  MCP 2   Tender     MCP 3   Tender     PIP 2   Tender        Investigation: No additional findings.  Imaging: No results found.  Recent Labs: Lab Results  Component Value Date   WBC 6.0 03/17/2018   HGB 11.7 03/17/2018   PLT 327 03/17/2018   NA 141 03/17/2018   K 4.7 03/17/2018   CL 106 03/17/2018   CO2 23 03/17/2018   GLUCOSE 85 03/17/2018   BUN 11 03/17/2018   CREATININE 0.68 03/17/2018   BILITOT 0.3 03/17/2018   BILITOT 0.3 03/17/2018   ALKPHOS 99 12/21/2016   AST 14  03/17/2018   AST 14 03/17/2018   ALT 11 03/17/2018   ALT 11 03/17/2018   PROT 6.8 03/17/2018   PROT 6.8 03/17/2018   ALBUMIN 4.0 12/21/2016   CALCIUM 9.5 03/17/2018   GFRAA 103 03/17/2018    Speciality Comments: PPD- 07/08/17 Negative  Procedures:  No procedures performed Allergies: Sulfa antibiotics    Assessment / Plan:     Visit Diagnoses: Rheumatoid arthritis, seronegative, multiple sites [facility]): She has no active synovitis on exam.  She has tenderness of the right second and third MCP and right second PIP joint.  She has noticed increased morning stiffness lasting about 1 hour.  She has been under a tremendous amount of stress since her husband is in hospice, and she has noticed some increased right hand pain and stiffness which improves as the day goes on.  She has not noticed any joint swelling.  She has been injecting Humira 40 mg subcutaneously every  21 days and methotrexate 0.6 mL once weekly. She would like to increase the frequency of Humira sq injections to every 14 days due to increased pain and stiffness recently. She has not missed any doses of these medications recently and she does not need any refills. She has not had any recent infections and had the seasonal influenza vaccination.  We discussed importance of yearly skin exams since she is on Humira.  We also discussed scheduling an appointment at the health department for a yearly PPD test.  High risk medication use -  Methotrexate 0.6 ml sq qweek, Folic acid 2mg  po qd and Humira 40 mg sq every 21 days.  CBC and CMP are drawn on 03/17/2018.  She will return for lab work in December and every 3 months to monitor for drug toxicity.  Last PPD was negative on 07/08/2017 per patient.  She will be scheduling her next appointment at the health department in January 2020.  She had the seasonal influenza vaccination.  We discussed the importance of yearly skin exams and she is on Humira.  Primary osteoarthritis of both hands: Mild PIP  and DIP synovial thickening. No synovitis noted. Complete fist formation bilaterally.  Joint protection and muscle strengthening discussed.   Primary osteoarthritis of both knees: No warmth or effusion. Good ROM with no discomfort.  Primary osteoarthritis of both feet: She has no discomfort in her feet at this time.  She wears proper fitting shoes.   Other medical conditions are listed as follows:   Thrombocytosis (HCC)  Other fatigue: Improved   History of vitamin D deficiency  History of anemia   Orders: No orders of the defined types were placed in this encounter.  No orders of the defined types were placed in this encounter.    Follow-Up Instructions: Return in about 5 months (around 10/21/2018) for Rheumatoid arthritis, Osteoarthritis.   Gearldine Bienenstock, PA-C   I examined and evaluated the patient with Sherron Ales PA.  Patient has been under a lot of stress due to her husband's illness.  She had tenderness on examination on her MCPs and PIPs on my examination.  She has been taking Humira every 21 days.  We discussed reducing the interval to every 14 days.  She was in agreement.  The plan of care was discussed as noted above.  Pollyann Savoy, MD Note - This record has been created using Animal nutritionist.  Chart creation errors have been sought, but may not always  have been located. Such creation errors do not reflect on  the standard of medical care.

## 2018-05-12 NOTE — Telephone Encounter (Signed)
Patient dropped off renewal application. Will fax in once Md signature has been received.

## 2018-05-12 NOTE — Telephone Encounter (Signed)
Spoke to patient, she will bring application by the office.

## 2018-05-16 ENCOUNTER — Telehealth: Payer: Self-pay | Admitting: Pharmacist

## 2018-05-16 DIAGNOSIS — M0609 Rheumatoid arthritis without rheumatoid factor, multiple sites: Secondary | ICD-10-CM

## 2018-05-16 MED ORDER — ADALIMUMAB 40 MG/0.8ML ~~LOC~~ PSKT: 40.0000 mg | PREFILLED_SYRINGE | SUBCUTANEOUS | 0 refills | Status: DC

## 2018-05-16 NOTE — Telephone Encounter (Signed)
Received fax from [facility] for Humira refills.  Faxed prescirption on 05/15/18.  Received fax for [facility] stating they received prescription.  Will send documents to scan center.

## 2018-05-16 NOTE — Addendum Note (Signed)
Addended by: Mariella Saa C on: 05/16/2018 09:23 AM   Modules accepted: Orders

## 2018-05-22 ENCOUNTER — Encounter: Payer: Self-pay | Admitting: Rheumatology

## 2018-05-22 ENCOUNTER — Ambulatory Visit: Payer: Medicare Other | Admitting: Rheumatology

## 2018-05-22 VITALS — BP 124/76 | HR 80 | Resp 12 | Ht 63.0 in | Wt 122.0 lb

## 2018-05-22 DIAGNOSIS — M170 Bilateral primary osteoarthritis of knee: Secondary | ICD-10-CM | POA: Diagnosis not present

## 2018-05-22 DIAGNOSIS — M0609 Rheumatoid arthritis without rheumatoid factor, multiple sites: Secondary | ICD-10-CM

## 2018-05-22 DIAGNOSIS — Z862 Personal history of diseases of the blood and blood-forming organs and certain disorders involving the immune mechanism: Secondary | ICD-10-CM

## 2018-05-22 DIAGNOSIS — Z79899 Other long term (current) drug therapy: Secondary | ICD-10-CM | POA: Diagnosis not present

## 2018-05-22 DIAGNOSIS — R5383 Other fatigue: Secondary | ICD-10-CM

## 2018-05-22 DIAGNOSIS — M19071 Primary osteoarthritis, right ankle and foot: Secondary | ICD-10-CM | POA: Diagnosis not present

## 2018-05-22 DIAGNOSIS — D75839 Thrombocytosis, unspecified: Secondary | ICD-10-CM

## 2018-05-22 DIAGNOSIS — M19072 Primary osteoarthritis, left ankle and foot: Secondary | ICD-10-CM

## 2018-05-22 DIAGNOSIS — M19042 Primary osteoarthritis, left hand: Secondary | ICD-10-CM

## 2018-05-22 DIAGNOSIS — M19041 Primary osteoarthritis, right hand: Secondary | ICD-10-CM

## 2018-05-22 DIAGNOSIS — D473 Essential (hemorrhagic) thrombocythemia: Secondary | ICD-10-CM

## 2018-05-22 DIAGNOSIS — Z8639 Personal history of other endocrine, nutritional and metabolic disease: Secondary | ICD-10-CM

## 2018-05-22 NOTE — Patient Instructions (Signed)
Standing Labs We placed an order today for your standing lab work.    Please come back and get your standing labs at the end December and every 3 months   We have open lab Monday through Friday from 8:30-11:30 AM and 1:30-4:00 PM  at the office of Dr. Bo Merino.   You may experience shorter wait times on Monday and Friday afternoons. The office is located at [address] [place], [place], [place] [address] No appointment is necessary.   Labs are drawn by [facility].  You may receive a bill from [facility] for your lab work. If you have any questions regarding directions or hours of operation,  please call [phone].   Just as a reminder please drink plenty of water prior to coming for your lab work. Thanks!

## 2018-06-07 NOTE — Telephone Encounter (Signed)
Received approval for Patient Assistance from [facility] for Humira. Patient has been approved for assistance through 06/21/2019.  Will send document to scan center.  Phone#[phone]  3:11 PM Beatriz Chancellor, CPhT

## 2018-07-03 ENCOUNTER — Other Ambulatory Visit: Payer: Self-pay

## 2018-07-03 DIAGNOSIS — Z79899 Other long term (current) drug therapy: Secondary | ICD-10-CM

## 2018-07-04 LAB — CBC WITH DIFFERENTIAL/PLATELET
ABSOLUTE MONOCYTES: 455 {cells}/uL (ref 200–950)
BASOS ABS: 48 {cells}/uL (ref 0–200)
Basophils Relative: 0.7 %
Eosinophils Absolute: 117 cells/uL (ref 15–500)
Eosinophils Relative: 1.7 %
HEMATOCRIT: 40.5 % (ref 35.0–45.0)
Hemoglobin: 13.6 g/dL (ref 11.7–15.5)
LYMPHS ABS: 2042 {cells}/uL (ref 850–3900)
MCH: 32.7 pg (ref 27.0–33.0)
MCHC: 33.6 g/dL (ref 32.0–36.0)
MCV: 97.4 fL (ref 80.0–100.0)
MPV: 9.7 fL (ref 7.5–12.5)
Monocytes Relative: 6.6 %
NEUTROS PCT: 61.4 %
Neutro Abs: 4237 cells/uL (ref 1500–7800)
Platelets: 428 10*3/uL — ABNORMAL HIGH (ref 140–400)
RBC: 4.16 10*6/uL (ref 3.80–5.10)
RDW: 12.9 % (ref 11.0–15.0)
Total Lymphocyte: 29.6 %
WBC: 6.9 10*3/uL (ref 3.8–10.8)

## 2018-07-04 LAB — COMPLETE METABOLIC PANEL WITH GFR
AG Ratio: 1.6 (calc) (ref 1.0–2.5)
ALBUMIN MSPROF: 4.2 g/dL (ref 3.6–5.1)
ALT: 9 U/L (ref 6–29)
AST: 13 U/L (ref 10–35)
Alkaline phosphatase (APISO): 84 U/L (ref 33–130)
BILIRUBIN TOTAL: 0.3 mg/dL (ref 0.2–1.2)
BUN: 13 mg/dL (ref 7–25)
CHLORIDE: 107 mmol/L (ref 98–110)
CO2: 26 mmol/L (ref 20–32)
CREATININE: 0.86 mg/dL (ref 0.60–0.93)
Calcium: 9.7 mg/dL (ref 8.6–10.4)
GFR, EST AFRICAN AMERICAN: 79 mL/min/{1.73_m2} (ref >=60)
GFR, Est Non African American: 68 mL/min/{1.73_m2} (ref >=60)
GLUCOSE: 69 mg/dL (ref 65–99)
Globulin: 2.6 g/dL (calc) (ref 1.9–3.7)
Potassium: 4.9 mmol/L (ref 3.5–5.3)
Sodium: 139 mmol/L (ref 135–146)
TOTAL PROTEIN: 6.8 g/dL (ref 6.1–8.1)

## 2018-07-04 NOTE — Progress Notes (Signed)
Labs are stable.  Platelets mildly elevated.  We will continue to monitor.

## 2018-07-17 DIAGNOSIS — M1990 Unspecified osteoarthritis, unspecified site: Secondary | ICD-10-CM | POA: Diagnosis not present

## 2018-07-17 DIAGNOSIS — I10 Essential (primary) hypertension: Secondary | ICD-10-CM | POA: Diagnosis not present

## 2018-07-17 DIAGNOSIS — E785 Hyperlipidemia, unspecified: Secondary | ICD-10-CM | POA: Diagnosis not present

## 2018-07-29 ENCOUNTER — Other Ambulatory Visit: Payer: Self-pay | Admitting: Rheumatology

## 2018-07-31 NOTE — Telephone Encounter (Signed)
Last Visit: 05/22/18 Next Visit: 10/24/18 Labs: 07/03/18 Labs are stable. Platelets mildly elevated.   Okay to refill per Dr. Estanislado Pandy

## 2018-09-15 ENCOUNTER — Other Ambulatory Visit: Payer: Self-pay | Admitting: Rheumatology

## 2018-09-15 NOTE — Telephone Encounter (Signed)
Last Visit: 05/22/18 Next Visit: 10/24/18 Labs: 07/03/18 Labs are stable. Platelets mildly elevated.   Okay to refill per Dr. Estanislado Pandy

## 2018-09-19 ENCOUNTER — Other Ambulatory Visit: Payer: Self-pay | Admitting: Rheumatology

## 2018-09-19 DIAGNOSIS — D75839 Thrombocytosis, unspecified: Secondary | ICD-10-CM

## 2018-09-19 DIAGNOSIS — D473 Essential (hemorrhagic) thrombocythemia: Secondary | ICD-10-CM

## 2018-09-19 NOTE — Telephone Encounter (Signed)
Last Visit: 05/22/18 Next Visit: 10/24/18  Okay to refill per Dr. Estanislado Pandy

## 2018-10-03 ENCOUNTER — Telehealth: Payer: Self-pay

## 2018-10-03 DIAGNOSIS — Z79899 Other long term (current) drug therapy: Secondary | ICD-10-CM

## 2018-10-03 DIAGNOSIS — M0609 Rheumatoid arthritis without rheumatoid factor, multiple sites: Secondary | ICD-10-CM

## 2018-10-03 MED ORDER — ADALIMUMAB 40 MG/0.8ML ~~LOC~~ PSKT: 40.0000 mg | PREFILLED_SYRINGE | SUBCUTANEOUS | Status: DC

## 2018-10-03 NOTE — Telephone Encounter (Signed)
Refill request recevied via fax from [facility].   Last Visit: 05/22/2018 Next Visit: 10/24/2018 Labs: 07/03/2018 Labs are stable. Platelets mildly elevated. We will continue to monitor. PPD: 07/08/2017 negative   Advised patient she is due to update labs, patient verbalized understanding and will get labs drawn this week at [facility]. Patient states she will get PPD updated once she is able to get an appointment at the health department. (she had to cancel scheduled appointment in the beginning of the year due to her husband being in hospice care).   Okay to refill per Dr. Estanislado Pandy.

## 2018-10-09 ENCOUNTER — Telehealth: Payer: Self-pay | Admitting: Rheumatology

## 2018-10-09 ENCOUNTER — Other Ambulatory Visit: Payer: Self-pay

## 2018-10-09 DIAGNOSIS — Z79899 Other long term (current) drug therapy: Secondary | ICD-10-CM

## 2018-10-09 NOTE — Telephone Encounter (Signed)
Labs have been released for [facility].

## 2018-10-09 NOTE — Telephone Encounter (Signed)
Patient going to [facility] on [address] for labs. Please release orders.

## 2018-10-11 DIAGNOSIS — Z79899 Other long term (current) drug therapy: Secondary | ICD-10-CM | POA: Diagnosis not present

## 2018-10-12 LAB — COMPLETE METABOLIC PANEL WITH GFR
AG Ratio: 1.4 (calc) (ref 1.0–2.5)
ALT: 10 U/L (ref 6–29)
AST: 14 U/L (ref 10–35)
Albumin: 3.9 g/dL (ref 3.6–5.1)
Alkaline phosphatase (APISO): 79 U/L (ref 37–153)
BUN: 9 mg/dL (ref 7–25)
CO2: 25 mmol/L (ref 20–32)
Calcium: 9.4 mg/dL (ref 8.6–10.4)
Chloride: 107 mmol/L (ref 98–110)
Creat: 0.73 mg/dL (ref 0.60–0.93)
GFR, Est African American: 97 mL/min/{1.73_m2} (ref >=60)
GFR, Est Non African American: 83 mL/min/{1.73_m2} (ref >=60)
Globulin: 2.7 g/dL (calc) (ref 1.9–3.7)
Glucose, Bld: 111 mg/dL — ABNORMAL HIGH (ref 65–99)
Potassium: 4.6 mmol/L (ref 3.5–5.3)
Sodium: 140 mmol/L (ref 135–146)
Total Bilirubin: 0.3 mg/dL (ref 0.2–1.2)
Total Protein: 6.6 g/dL (ref 6.1–8.1)

## 2018-10-12 LAB — CBC WITH DIFFERENTIAL/PLATELET
Absolute Monocytes: 524 cells/uL (ref 200–950)
Basophils Absolute: 31 cells/uL (ref 0–200)
Basophils Relative: 0.4 %
Eosinophils Absolute: 123 cells/uL (ref 15–500)
Eosinophils Relative: 1.6 %
HCT: 36.3 % (ref 35.0–45.0)
Hemoglobin: 11.8 g/dL (ref 11.7–15.5)
Lymphs Abs: 1632 cells/uL (ref 850–3900)
MCH: 32.4 pg (ref 27.0–33.0)
MCHC: 32.5 g/dL (ref 32.0–36.0)
MCV: 99.7 fL (ref 80.0–100.0)
MPV: 8.9 fL (ref 7.5–12.5)
Monocytes Relative: 6.8 %
Neutro Abs: 5390 cells/uL (ref 1500–7800)
Neutrophils Relative %: 70 %
Platelets: 417 10*3/uL — ABNORMAL HIGH (ref 140–400)
RBC: 3.64 10*6/uL — ABNORMAL LOW (ref 3.80–5.10)
RDW: 13.5 % (ref 11.0–15.0)
Total Lymphocyte: 21.2 %
WBC: 7.7 10*3/uL (ref 3.8–10.8)

## 2018-10-12 NOTE — Progress Notes (Signed)
stable °

## 2018-10-17 NOTE — Progress Notes (Signed)
Virtual Visit via Telephone Note  I connected with Jean [name] on 10/24/18 at  9:45 AM EDT by telephone and verified that I am speaking with the correct person using two identifiers.   I discussed the limitations, risks, security and privacy concerns of performing an evaluation and management service by telephone and the availability of in person appointments. I also discussed with the patient that there may be a patient responsible charge related to this service. The patient expressed understanding and agreed to proceed.  This service was conducted via virtual visit.  She was unable to use Webex, so we reached her by telephone. The patient was located at home. I was located in my office.  Consent was obtained prior to the virtual visit and is aware of possible charges through their insurance for this visit.  The patient is an established patient.  Dr. Estanislado Pandy, MD conducted the virtual visit and Hazel Sams, PA-C acted as scribe during the service.  Office staff helped with scheduling follow up visits after the service was conducted.   CC: Medication monitoring   History of Present Illness: Patient is a 73 year old female with a past medical history of seronegative rheumatoid arthritis and osteoarthritis. She is on Methotrexate 0.6 ml sq qweek, Folic acid 2mg  po qd and Humira 40 mg sq every 21 days.  She has not had any recent flares.  She has no joint pain or joint swelling.  She has no joint stiffness.  She has no difficulty with ADLs.  She states her husband passed away in October 10, 2018, so she has been grieving and under increased stress.  She has not had a flare despite being under stress.   Review of Systems  Constitutional: Negative for fever and malaise/fatigue.  Eyes: Negative for photophobia, pain, discharge and redness.       +Dry eyes  Respiratory: Negative for cough, shortness of breath and wheezing.   Cardiovascular: Negative for chest pain and palpitations.   Gastrointestinal: Negative for blood in stool, constipation and diarrhea.  Genitourinary: Negative for dysuria.  Musculoskeletal: Negative for back pain, joint pain, myalgias and neck pain.  Skin: Negative for rash.  Neurological: Negative for dizziness and headaches.  Endo/Heme/Allergies: Positive for environmental allergies.  Psychiatric/Behavioral: Positive for depression. The patient is not nervous/anxious and does not have insomnia.      Observations/Objective: Physical Exam  Constitutional: She is oriented to person, place, and time.  Neurological: She is alert and oriented to person, place, and time.  Psychiatric: Mood, memory, affect and judgment normal.   Patient reports morning stiffness for 0 minutes.   Patient denies nocturnal pain.  Difficulty dressing/grooming: Denies Difficulty climbing stairs: Denies Difficulty getting out of chair: Denies Difficulty using hands for taps, buttons, cutlery, and/or writing: Denies  Assessment and Plan: Rheumatoid arthritis, seronegative, multiple sites [facility]): She has not had any recent rheumatoid arthritis flares.  She has no joint pain or joint swelling. She has no morning stiffness.  She has no difficulty with ADLs.  She is clinically doing well on Methotrexate 0.6 ml sq once weekly, folic acid 2 mg po daily, and Humira 40 mg sq injections every 21 days.  She has not noticed any increased joint pain or joint swelling since spacing Humira.  She will continue on this current treatment regimen.  She does not need any refills.  She was advised to notify us if she develops increased joint pain or joint swelling.  She will follow up in 3 months.  High risk medication use -  Methotrexate 0.6 ml sq qweek, Folic acid 2mg  po qd and Humira 40 mg sq every 21 days. She is due to update ppd. Most recent ppd negative on 07/08/17. CBC and CMP were drawn on 10/11/18.  She will be due to update CBC and CMP in July and every 3 months. Standing orders are in  place. She was advised to hold MTX and Humira if she develops an infection and to resume these medications once the infection has cleared.  We discussed the importance of social distancing and following the standard precautions recommended by the CDC.   Primary osteoarthritis of both hands: She has no joint pain or joint swelling at this time.  She has no difficulty ADLs.    Primary osteoarthritis of both knees: She has no joint pain or joint swelling.    Primary osteoarthritis of both feet: She has no joint pain or joint swelling.    Other medical conditions are listed as follows:   Thrombocytosis (HCC)  Other fatigue: Improved   History of vitamin D deficiency  Follow Up Instructions: She will follow up in 3 months.    I discussed the assessment and treatment plan with the patient. The patient was provided an opportunity to ask questions and all were answered. The patient agreed with the plan and demonstrated an understanding of the instructions.   The patient was advised to call back or seek an in-person evaluation if the symptoms worsen or if the condition fails to improve as anticipated.  I provided 25 minutes of non-face-to-face time during this encounter.  Bo Merino, MD   Scribed by-  Ofilia Neas, PA-C

## 2018-10-24 ENCOUNTER — Telehealth (INDEPENDENT_AMBULATORY_CARE_PROVIDER_SITE_OTHER): Payer: Medicare Other | Admitting: Rheumatology

## 2018-10-24 ENCOUNTER — Other Ambulatory Visit: Payer: Self-pay

## 2018-10-24 ENCOUNTER — Encounter: Payer: Self-pay | Admitting: Rheumatology

## 2018-10-24 DIAGNOSIS — R5383 Other fatigue: Secondary | ICD-10-CM

## 2018-10-24 DIAGNOSIS — D75839 Thrombocytosis, unspecified: Secondary | ICD-10-CM

## 2018-10-24 DIAGNOSIS — Z862 Personal history of diseases of the blood and blood-forming organs and certain disorders involving the immune mechanism: Secondary | ICD-10-CM

## 2018-10-24 DIAGNOSIS — M19072 Primary osteoarthritis, left ankle and foot: Secondary | ICD-10-CM

## 2018-10-24 DIAGNOSIS — M19041 Primary osteoarthritis, right hand: Secondary | ICD-10-CM

## 2018-10-24 DIAGNOSIS — Z79899 Other long term (current) drug therapy: Secondary | ICD-10-CM

## 2018-10-24 DIAGNOSIS — M19071 Primary osteoarthritis, right ankle and foot: Secondary | ICD-10-CM

## 2018-10-24 DIAGNOSIS — M0609 Rheumatoid arthritis without rheumatoid factor, multiple sites: Secondary | ICD-10-CM

## 2018-10-24 DIAGNOSIS — M19042 Primary osteoarthritis, left hand: Secondary | ICD-10-CM

## 2018-10-24 DIAGNOSIS — M170 Bilateral primary osteoarthritis of knee: Secondary | ICD-10-CM

## 2018-10-24 DIAGNOSIS — D473 Essential (hemorrhagic) thrombocythemia: Secondary | ICD-10-CM

## 2018-10-24 DIAGNOSIS — Z8639 Personal history of other endocrine, nutritional and metabolic disease: Secondary | ICD-10-CM

## 2018-10-30 ENCOUNTER — Telehealth: Payer: Self-pay | Admitting: Rheumatology

## 2018-10-30 MED ORDER — ADALIMUMAB 40 MG/0.4ML ~~LOC~~ AJKT: 40.0000 mg | AUTO-INJECTOR | SUBCUTANEOUS | 0 refills | Status: DC

## 2018-10-30 NOTE — Telephone Encounter (Signed)
Spoke with patient and she states she is about to get her prescription from [place]. Patient advised we will change her to the citrate free with the next prescription. Patient verbalized understanding.

## 2018-10-30 NOTE — Telephone Encounter (Signed)
Patient would like to change rx for Humira to the kind that does not hurt when injected, if possible. [facility]. Please call to advise.

## 2018-12-11 DIAGNOSIS — E785 Hyperlipidemia, unspecified: Secondary | ICD-10-CM | POA: Diagnosis not present

## 2018-12-11 DIAGNOSIS — M1990 Unspecified osteoarthritis, unspecified site: Secondary | ICD-10-CM | POA: Diagnosis not present

## 2018-12-11 DIAGNOSIS — I10 Essential (primary) hypertension: Secondary | ICD-10-CM | POA: Diagnosis not present

## 2019-01-12 ENCOUNTER — Telehealth: Payer: Self-pay | Admitting: *Deleted

## 2019-01-12 DIAGNOSIS — Z9225 Personal history of immunosupression therapy: Secondary | ICD-10-CM

## 2019-01-12 NOTE — Telephone Encounter (Signed)
Last Visit: 10/24/18 Next Visit: 02/02/19 Labs: 10/11/18 stable TB Skin test 07/08/17 neg   Patient advised she is due to update labs. Patient will come 01/15/19 to update labs.

## 2019-01-15 ENCOUNTER — Other Ambulatory Visit: Payer: Self-pay | Admitting: *Deleted

## 2019-01-15 DIAGNOSIS — Z9225 Personal history of immunosupression therapy: Secondary | ICD-10-CM

## 2019-01-15 DIAGNOSIS — Z79899 Other long term (current) drug therapy: Secondary | ICD-10-CM

## 2019-01-15 DIAGNOSIS — Z8639 Personal history of other endocrine, nutritional and metabolic disease: Secondary | ICD-10-CM

## 2019-01-15 DIAGNOSIS — E559 Vitamin D deficiency, unspecified: Secondary | ICD-10-CM | POA: Diagnosis not present

## 2019-01-17 LAB — QUANTIFERON-TB GOLD PLUS
Mitogen-NIL: 2.96 [IU]/mL
NIL: 0.02 [IU]/mL
QuantiFERON-TB Gold Plus: NEGATIVE
TB1-NIL: 0 [IU]/mL
TB2-NIL: 0 [IU]/mL

## 2019-01-17 LAB — CBC WITH DIFFERENTIAL/PLATELET
Absolute Monocytes: 496 cells/uL (ref 200–950)
Basophils Absolute: 61 cells/uL (ref 0–200)
Basophils Relative: 0.9 %
Eosinophils Absolute: 156 cells/uL (ref 15–500)
Eosinophils Relative: 2.3 %
HCT: 38.4 % (ref 35.0–45.0)
Hemoglobin: 12.6 g/dL (ref 11.7–15.5)
Lymphs Abs: 1720 cells/uL (ref 850–3900)
MCH: 32.2 pg (ref 27.0–33.0)
MCHC: 32.8 g/dL (ref 32.0–36.0)
MCV: 98.2 fL (ref 80.0–100.0)
MPV: 9.7 fL (ref 7.5–12.5)
Monocytes Relative: 7.3 %
Neutro Abs: 4366 cells/uL (ref 1500–7800)
Neutrophils Relative %: 64.2 %
Platelets: 416 10*3/uL — ABNORMAL HIGH (ref 140–400)
RBC: 3.91 10*6/uL (ref 3.80–5.10)
RDW: 13.5 % (ref 11.0–15.0)
Total Lymphocyte: 25.3 %
WBC: 6.8 10*3/uL (ref 3.8–10.8)

## 2019-01-17 LAB — COMPLETE METABOLIC PANEL WITHOUT GFR
AG Ratio: 1.4 (calc) (ref 1.0–2.5)
ALT: 10 U/L (ref 6–29)
AST: 15 U/L (ref 10–35)
Albumin: 4.2 g/dL (ref 3.6–5.1)
Alkaline phosphatase (APISO): 87 U/L (ref 37–153)
BUN: 10 mg/dL (ref 7–25)
CO2: 27 mmol/L (ref 20–32)
Calcium: 9.6 mg/dL (ref 8.6–10.4)
Chloride: 105 mmol/L (ref 98–110)
Creat: 0.77 mg/dL (ref 0.60–0.93)
GFR, Est African American: 91 mL/min/{1.73_m2}
GFR, Est Non African American: 78 mL/min/{1.73_m2}
Globulin: 3 g/dL (ref 1.9–3.7)
Glucose, Bld: 73 mg/dL (ref 65–99)
Potassium: 4.3 mmol/L (ref 3.5–5.3)
Sodium: 139 mmol/L (ref 135–146)
Total Bilirubin: 0.3 mg/dL (ref 0.2–1.2)
Total Protein: 7.2 g/dL (ref 6.1–8.1)

## 2019-01-17 LAB — VITAMIN D 25 HYDROXY (VIT D DEFICIENCY, FRACTURES): Vit D, 25-Hydroxy: 30 ng/mL (ref 30–100)

## 2019-01-19 NOTE — Progress Notes (Deleted)
Office Visit Note  Patient: Jean [name]             Date of Birth: [birth year]           MRN: [id number]             PCP: [name], MD Referring: [name], MD Visit Date: 02/02/2019 Occupation: @GUAROCC @  Subjective:  No chief complaint on file.   Humira 40 mg every 14 days, methotrexate 0.6 mL every 7 days, and folic acid 1 mg 2 tablets daily.  Last TB gold negative on 01/15/2019 and will monitor yearly.  Most recent CBC/CMP within normal limits except for mildly elevated platelets but stable on 01/15/2019.  Due for CBC/CMP in October and will monitor every 3 months.  Standing orders placed.  She received the flu vaccine in December 2018.  Recommend annual influenza, Pneumovax 23, Prevnar 13, and Shingrix as indicated for immunosuppressant therapy. Recommend yearly skin exams due to increased risk of melanoma.  History of Present Illness: Jean [name] is a 73 y.o. female ***   Activities of Daily Living:  Patient reports morning stiffness for *** {minute/hour:19697}.   Patient {ACTIONS;DENIES/REPORTS:21021675::"Denies"} nocturnal pain.  Difficulty dressing/grooming: {ACTIONS;DENIES/REPORTS:21021675::"Denies"} Difficulty climbing stairs: {ACTIONS;DENIES/REPORTS:21021675::"Denies"} Difficulty getting out of chair: {ACTIONS;DENIES/REPORTS:21021675::"Denies"} Difficulty using hands for taps, buttons, cutlery, and/or writing: {ACTIONS;DENIES/REPORTS:21021675::"Denies"}  No Rheumatology ROS completed.   PMFS History:  Patient Active Problem List   Diagnosis Date Noted  . Vitamin D deficiency 01/23/2017  . Rheumatoid arthritis, seronegative, multiple sites (HCC) 01/19/2017  . High risk medication use 01/19/2017  . Primary osteoarthritis of both hands 01/19/2017  . Primary osteoarthritis of both knees 01/19/2017  . Fatigue 01/19/2017  . Thrombocytosis (HCC) 01/19/2017  . Primary osteoarthritis of both feet 01/19/2017  . History of anemia 01/19/2017  .  Screening-pulmonary TB 04/26/2016    Past Medical History:  Diagnosis Date  . Arthritis   . Hypertension     Family History  Problem Relation Age of Onset  . Hypertension Daughter    Past Surgical History:  Procedure Laterality Date  . ECTOPIC PREGNANCY SURGERY    . EXTERNAL EAR SURGERY Right    removal of cyst or gland   Social History   Social History Narrative  . Not on file   Immunization History  Administered Date(s) Administered  . Influenza, High Dose Seasonal PF 06/09/2017     Objective: Vital Signs: There were no vitals taken for this visit.   Physical Exam   Musculoskeletal Exam: ***  CDAI Exam: CDAI Score: - Patient Global: -; Provider Global: - Swollen: -; Tender: - Joint Exam   No joint exam has been documented for this visit   There is currently no information documented on the homunculus. Go to the Rheumatology activity and complete the homunculus joint exam.  Investigation: No additional findings.  Imaging: No results found.  Recent Labs: Lab Results  Component Value Date   WBC 6.8 01/15/2019   HGB 12.6 01/15/2019   PLT 416 (H) 01/15/2019   NA 139 01/15/2019   K 4.3 01/15/2019   CL 105 01/15/2019   CO2 27 01/15/2019   GLUCOSE 73 01/15/2019   BUN 10 01/15/2019   CREATININE 0.77 01/15/2019   BILITOT 0.3 01/15/2019   ALKPHOS 99 12/21/2016   AST 15 01/15/2019   ALT 10 01/15/2019   PROT 7.2 01/15/2019   ALBUMIN 4.0 12/21/2016   CALCIUM 9.6 01/15/2019   GFRAA 91 01/15/2019   QFTBGOLDPLUS NEGATIVE 01/15/2019    Speciality  Comments: PPD- 07/08/17 Negative  Procedures:  No procedures performed Allergies: Sulfa antibiotics   Assessment / Plan:     Visit Diagnoses: No diagnosis found.  Orders: No orders of the defined types were placed in this encounter.  No orders of the defined types were placed in this encounter.   Face-to-face time spent with patient was *** minutes. Greater than 50% of time was spent in counseling and  coordination of care.  Follow-Up Instructions: No follow-ups on file.   Gearldine Bienenstock, PA-C  Note - This record has been created using Dragon software.  Chart creation errors have been sought, but may not always  have been located. Such creation errors do not reflect on  the standard of medical care.

## 2019-02-01 ENCOUNTER — Ambulatory Visit: Payer: Medicare Other | Admitting: Rheumatology

## 2019-02-01 ENCOUNTER — Encounter: Payer: Self-pay | Admitting: Rheumatology

## 2019-02-01 ENCOUNTER — Other Ambulatory Visit: Payer: Self-pay

## 2019-02-01 VITALS — BP 136/86 | HR 92 | Resp 13 | Ht 63.0 in | Wt 118.0 lb

## 2019-02-01 DIAGNOSIS — M19071 Primary osteoarthritis, right ankle and foot: Secondary | ICD-10-CM | POA: Diagnosis not present

## 2019-02-01 DIAGNOSIS — D473 Essential (hemorrhagic) thrombocythemia: Secondary | ICD-10-CM

## 2019-02-01 DIAGNOSIS — R5383 Other fatigue: Secondary | ICD-10-CM

## 2019-02-01 DIAGNOSIS — M19042 Primary osteoarthritis, left hand: Secondary | ICD-10-CM

## 2019-02-01 DIAGNOSIS — M19041 Primary osteoarthritis, right hand: Secondary | ICD-10-CM

## 2019-02-01 DIAGNOSIS — D75839 Thrombocytosis, unspecified: Secondary | ICD-10-CM

## 2019-02-01 DIAGNOSIS — M19072 Primary osteoarthritis, left ankle and foot: Secondary | ICD-10-CM

## 2019-02-01 DIAGNOSIS — M170 Bilateral primary osteoarthritis of knee: Secondary | ICD-10-CM | POA: Diagnosis not present

## 2019-02-01 DIAGNOSIS — M0609 Rheumatoid arthritis without rheumatoid factor, multiple sites: Secondary | ICD-10-CM | POA: Diagnosis not present

## 2019-02-01 DIAGNOSIS — Z862 Personal history of diseases of the blood and blood-forming organs and certain disorders involving the immune mechanism: Secondary | ICD-10-CM

## 2019-02-01 DIAGNOSIS — Z8639 Personal history of other endocrine, nutritional and metabolic disease: Secondary | ICD-10-CM

## 2019-02-01 DIAGNOSIS — Z79899 Other long term (current) drug therapy: Secondary | ICD-10-CM

## 2019-02-01 DIAGNOSIS — J040 Acute laryngitis: Secondary | ICD-10-CM

## 2019-02-01 MED ORDER — ONDANSETRON HCL 4 MG PO TABS: 4.0000 mg | ORAL_TABLET | Freq: Four times a day (QID) | ORAL | 2 refills | Status: DC | PRN

## 2019-02-01 NOTE — Patient Instructions (Signed)
Standing Labs We placed an order today for your standing lab work.    Please come back and get your standing labs in October and every 3 months   We have open lab daily Monday through Thursday from 8:30-12:30 PM and 1:30-4:30 PM and Friday from 8:30-12:30 PM and 1:30 -4:00 PM at the office of Dr. Maclean Foister.   You may experience shorter wait times on Monday and Friday afternoons. The office is located at [address], [address], [place], [place] [address] No appointment is necessary.   Labs are drawn by [facility].  You may receive a bill from [facility] for your lab work.  If you wish to have your labs drawn at another location, please call the office 24 hours in advance to send orders.  If you have any questions regarding directions or hours of operation,  please call [phone].   Just as a reminder please drink plenty of water prior to coming for your lab work. Thanks!   

## 2019-02-01 NOTE — Progress Notes (Signed)
Office Visit Note  Patient: Jean [name]             Date of Birth: [birth year]           MRN: [id number]             PCP: [name], MD Referring: [name], MD Visit Date: 02/01/2019 Occupation: @GUAROCC @  Subjective:  Medication management.   History of Present Illness: Jean [name] is a 73 y.o. female with history of rheumatoid arthritis.  She has been on Humira every 3 weeks and methotrexate on a weekly basis.  She states she has been doing well without any joint pain or joint swelling.  Patient states that she has been having nausea a day after and the day of taking methotrexate.  She has been having intermittent laryngitis.  She is suffering from laryngitis right now.  She denies any sore throat or fever.  Patient notices some stiffness in her right ankle and left shoulder after prolonged sitting.  Activities of Daily Living:  Patient reports morning stiffness for 0 minutes.   Patient Denies nocturnal pain.  Difficulty dressing/grooming: Denies Difficulty climbing stairs: Denies Difficulty getting out of chair: Denies Difficulty using hands for taps, buttons, cutlery, and/or writing: Denies  Review of Systems  Constitutional: Negative for fatigue, night sweats, weight gain and weight loss.  HENT: Positive for voice change. Negative for mouth sores, ear ringing, trouble swallowing, trouble swallowing, mouth dryness and nose dryness.   Eyes: Negative for pain, redness, itching, visual disturbance and dryness.  Respiratory: Negative for cough, shortness of breath, wheezing and difficulty breathing.   Cardiovascular: Negative for chest pain, palpitations, hypertension, irregular heartbeat and swelling in legs/feet.  Gastrointestinal: Positive for nausea. Negative for blood in stool, constipation and diarrhea.  Endocrine: Negative for increased urination.  Genitourinary: Negative for painful urination and vaginal dryness.  Musculoskeletal: Negative for  arthralgias, joint pain, joint swelling, myalgias, muscle weakness, morning stiffness, muscle tenderness and myalgias.  Skin: Negative for color change, rash, hair loss, redness, skin tightness, ulcers and sensitivity to sunlight.  Allergic/Immunologic: Negative for susceptible to infections.  Neurological: Negative for dizziness, headaches, memory loss, night sweats and weakness.  Hematological: Negative for swollen glands.  Psychiatric/Behavioral: Negative for depressed mood, confusion and sleep disturbance. The patient is not nervous/anxious.     PMFS History:  Patient Active Problem List   Diagnosis Date Noted  . Vitamin D deficiency 01/23/2017  . Rheumatoid arthritis, seronegative, multiple sites (HCC) 01/19/2017  . High risk medication use 01/19/2017  . Primary osteoarthritis of both hands 01/19/2017  . Primary osteoarthritis of both knees 01/19/2017  . Fatigue 01/19/2017  . Thrombocytosis (HCC) 01/19/2017  . Primary osteoarthritis of both feet 01/19/2017  . History of anemia 01/19/2017  . Screening-pulmonary TB 04/26/2016    Past Medical History:  Diagnosis Date  . Arthritis   . Hypertension     Family History  Problem Relation Age of Onset  . Hypertension Daughter    Past Surgical History:  Procedure Laterality Date  . ECTOPIC PREGNANCY SURGERY    . EXTERNAL EAR SURGERY Right    removal of cyst or gland   Social History   Social History Narrative  . Not on file   Immunization History  Administered Date(s) Administered  . Influenza, High Dose Seasonal PF 06/09/2017     Objective: Vital Signs: BP 136/86 (BP Location: Right Arm, Patient Position: Sitting, Cuff Size: Normal)   Pulse 92   Resp 13   Ht  5\' 3"  (1.6 m)   Wt 118 lb (53.5 kg)   BMI 20.90 kg/m    Physical Exam Vitals signs and nursing note reviewed.  Constitutional:      Appearance: She is well-developed.  HENT:     Head: Normocephalic and atraumatic.  Eyes:     Conjunctiva/sclera:  Conjunctivae normal.  Neck:     Musculoskeletal: Normal range of motion.  Cardiovascular:     Rate and Rhythm: Normal rate and regular rhythm.     Heart sounds: Normal heart sounds.  Pulmonary:     Effort: Pulmonary effort is normal.     Breath sounds: Normal breath sounds.  Abdominal:     General: Bowel sounds are normal.     Palpations: Abdomen is soft.  Lymphadenopathy:     Cervical: No cervical adenopathy.  Skin:    General: Skin is warm and dry.     Capillary Refill: Capillary refill takes less than 2 seconds.  Neurological:     Mental Status: She is alert and oriented to person, place, and time.  Psychiatric:        Behavior: Behavior normal.      Musculoskeletal Exam: C-spine was in good range of motion.  Shoulder joints, elbow joints, wrist joints, MCPs PIPs and DIPs in good range of motion.  She has some synovial thickening but no synovitis.  Hip joints and knee joints, MCPs PIPs and DIPs with good range of motion with no synovitis.  CDAI Exam: CDAI Score: 0.4  Patient Global: 2 mm; Provider Global: 2 mm Swollen: 0 ; Tender: 0  Joint Exam   No joint exam has been documented for this visit   There is currently no information documented on the homunculus. Go to the Rheumatology activity and complete the homunculus joint exam.  Investigation: No additional findings.  Imaging: No results found.  Recent Labs: Lab Results  Component Value Date   WBC 6.8 01/15/2019   HGB 12.6 01/15/2019   PLT 416 (H) 01/15/2019   NA 139 01/15/2019   K 4.3 01/15/2019   CL 105 01/15/2019   CO2 27 01/15/2019   GLUCOSE 73 01/15/2019   BUN 10 01/15/2019   CREATININE 0.77 01/15/2019   BILITOT 0.3 01/15/2019   ALKPHOS 99 12/21/2016   AST 15 01/15/2019   ALT 10 01/15/2019   PROT 7.2 01/15/2019   ALBUMIN 4.0 12/21/2016   CALCIUM 9.6 01/15/2019   GFRAA 91 01/15/2019   QFTBGOLDPLUS NEGATIVE 01/15/2019    Speciality Comments: PPD- 07/08/17 Negative  Procedures:  No procedures  performed Allergies: Sulfa antibiotics   Assessment / Plan:     Visit Diagnoses: Rheumatoid arthritis, seronegative, multiple sites (HCC) -patient's arthritis is well controlled.  She has been taking Humira 0.6 mL/week along with Humira every third week.  She states she can feel some discomfort right prior to next Humira injection.  She also feels stiffness after prolonged sitting.  She is concerned about the nausea she is experiencing with methotrexate.  We discussed adding Zofran with her regimen.  She was in agreement.  I will call in prescription for Zofran 4 mg every 6 hours as needed for nausea which she will take on the day of methotrexate injection.  High risk medication use - Methotrexate 0.6 ml sq qweek, Folic acid 2mg  po qd and Humira 40 mg sq every 21 days.  Patient had labs on January 15, 2019 which are within normal limits.  She has mild thrombocytosis which has been stable.  Primary osteoarthritis  of both hands -she has DIP and PIP thickening.  Joint protection was discussed.  Primary osteoarthritis of both knees -she is currently not having much discomfort.  She has some stiffness.  Primary osteoarthritis of both feet -proper fitting shoes were discussed.  History of vitamin D deficiency -she has been advised to take vitamin D supplement.  Her vitamin D was 30 on January 15, 2019.  Other fatigue -improved.  History of anemia -improved.  Thrombocytosis (HCC) -she has mild chronic thrombocytosis.  Laryngitis -patient complains of recurrent laryngitis.  She has no fever or sore throat.  After discussing with patient I decided to refer her to ENT for evaluation.  Plan: Ambulatory referral to ENT  Orders: Orders Placed This Encounter  Procedures  . Ambulatory referral to ENT   Meds ordered this encounter  Medications  . ondansetron (ZOFRAN) 4 MG tablet    Sig: Take 1 tablet (4 mg total) by mouth every 6 (six) hours as needed for nausea or vomiting.    Dispense:  30 tablet     Refill:  2    Face-to-face time spent with patient was 25 minutes. Greater than 50% of time was spent in counseling and coordination of care.  Follow-Up Instructions: Return in about 5 months (around 07/04/2019) for Rheumatoid arthritis.   Pollyann Savoy, MD  Note - This record has been created using Animal nutritionist.  Chart creation errors have been sought, but may not always  have been located. Such creation errors do not reflect on  the standard of medical care.

## 2019-02-02 ENCOUNTER — Ambulatory Visit: Payer: Medicare Other | Admitting: Rheumatology

## 2019-02-12 ENCOUNTER — Telehealth: Payer: Self-pay | Admitting: Pharmacist

## 2019-02-12 NOTE — Telephone Encounter (Signed)
Patient dropped off application for [facility] Assist renewal.  Provider portion filled out and application faxed on [phone].  Will send documents to the scan center.  Will update when we receive a response.

## 2019-02-19 DIAGNOSIS — I10 Essential (primary) hypertension: Secondary | ICD-10-CM | POA: Diagnosis not present

## 2019-02-19 DIAGNOSIS — E785 Hyperlipidemia, unspecified: Secondary | ICD-10-CM | POA: Diagnosis not present

## 2019-02-19 NOTE — Telephone Encounter (Signed)
Received a fax from [place] regarding an approval for Humira from 02/16/2019 to 06/20/2020.   Phone # [phone]  Will send document to scan center.  Mariella Saa, PharmD, [place], [place] Clinical Specialty Pharmacist [phone]  02/19/2019 8:38 AM

## 2019-03-09 DIAGNOSIS — R490 Dysphonia: Secondary | ICD-10-CM | POA: Diagnosis not present

## 2019-03-09 DIAGNOSIS — J381 Polyp of vocal cord and larynx: Secondary | ICD-10-CM | POA: Diagnosis not present

## 2019-03-12 DIAGNOSIS — M1990 Unspecified osteoarthritis, unspecified site: Secondary | ICD-10-CM | POA: Diagnosis not present

## 2019-03-12 DIAGNOSIS — I10 Essential (primary) hypertension: Secondary | ICD-10-CM | POA: Diagnosis not present

## 2019-03-12 DIAGNOSIS — M069 Rheumatoid arthritis, unspecified: Secondary | ICD-10-CM | POA: Diagnosis not present

## 2019-03-12 DIAGNOSIS — E785 Hyperlipidemia, unspecified: Secondary | ICD-10-CM | POA: Diagnosis not present

## 2019-03-15 ENCOUNTER — Other Ambulatory Visit: Payer: Self-pay | Admitting: Otolaryngology

## 2019-03-19 ENCOUNTER — Encounter (HOSPITAL_BASED_OUTPATIENT_CLINIC_OR_DEPARTMENT_OTHER): Payer: Self-pay | Admitting: *Deleted

## 2019-03-19 ENCOUNTER — Other Ambulatory Visit: Payer: Self-pay

## 2019-03-22 ENCOUNTER — Other Ambulatory Visit (HOSPITAL_COMMUNITY)
Admission: RE | Admit: 2019-03-22 | Discharge: 2019-03-22 | Disposition: A | Discharge status: Home / Self Care | Payer: Medicare Other | Source: Ambulatory Visit | Attending: Otolaryngology | Admitting: Otolaryngology

## 2019-03-22 DIAGNOSIS — Z20828 Contact with and (suspected) exposure to other viral communicable diseases: Secondary | ICD-10-CM | POA: Diagnosis not present

## 2019-03-22 DIAGNOSIS — R490 Dysphonia: Secondary | ICD-10-CM | POA: Diagnosis present

## 2019-03-22 DIAGNOSIS — J383 Other diseases of vocal cords: Secondary | ICD-10-CM | POA: Diagnosis present

## 2019-03-22 DIAGNOSIS — I10 Essential (primary) hypertension: Secondary | ICD-10-CM | POA: Diagnosis not present

## 2019-03-22 DIAGNOSIS — M069 Rheumatoid arthritis, unspecified: Secondary | ICD-10-CM | POA: Diagnosis not present

## 2019-03-22 DIAGNOSIS — Z87891 Personal history of nicotine dependence: Secondary | ICD-10-CM | POA: Diagnosis not present

## 2019-03-22 DIAGNOSIS — C320 Malignant neoplasm of glottis: Secondary | ICD-10-CM | POA: Diagnosis not present

## 2019-03-23 LAB — NOVEL CORONAVIRUS, NAA (HOSP ORDER, SEND-OUT TO REF LAB; TAT 18-24 HRS): SARS-CoV-2, NAA: NOT DETECTED

## 2019-03-26 ENCOUNTER — Ambulatory Visit (HOSPITAL_BASED_OUTPATIENT_CLINIC_OR_DEPARTMENT_OTHER): Payer: Medicare Other | Admitting: Anesthesiology

## 2019-03-26 ENCOUNTER — Other Ambulatory Visit: Payer: Self-pay

## 2019-03-26 ENCOUNTER — Encounter (HOSPITAL_BASED_OUTPATIENT_CLINIC_OR_DEPARTMENT_OTHER): Payer: Self-pay | Admitting: *Deleted

## 2019-03-26 ENCOUNTER — Ambulatory Visit (HOSPITAL_BASED_OUTPATIENT_CLINIC_OR_DEPARTMENT_OTHER)
Admission: RE | Admit: 2019-03-26 | Discharge: 2019-03-26 | Disposition: A | Discharge status: Home / Self Care | Payer: Medicare Other | Attending: Otolaryngology | Admitting: Otolaryngology

## 2019-03-26 ENCOUNTER — Encounter (HOSPITAL_BASED_OUTPATIENT_CLINIC_OR_DEPARTMENT_OTHER)
Admission: RE | Disposition: A | Discharge status: Home / Self Care | Payer: Self-pay | Source: Home / Self Care | Attending: Otolaryngology

## 2019-03-26 DIAGNOSIS — C320 Malignant neoplasm of glottis: Secondary | ICD-10-CM | POA: Insufficient documentation

## 2019-03-26 DIAGNOSIS — Z87891 Personal history of nicotine dependence: Secondary | ICD-10-CM | POA: Diagnosis not present

## 2019-03-26 DIAGNOSIS — M069 Rheumatoid arthritis, unspecified: Secondary | ICD-10-CM | POA: Diagnosis not present

## 2019-03-26 DIAGNOSIS — D473 Essential (hemorrhagic) thrombocythemia: Secondary | ICD-10-CM | POA: Diagnosis not present

## 2019-03-26 DIAGNOSIS — J383 Other diseases of vocal cords: Secondary | ICD-10-CM | POA: Diagnosis not present

## 2019-03-26 DIAGNOSIS — I10 Essential (primary) hypertension: Secondary | ICD-10-CM | POA: Insufficient documentation

## 2019-03-26 DIAGNOSIS — M170 Bilateral primary osteoarthritis of knee: Secondary | ICD-10-CM | POA: Diagnosis not present

## 2019-03-26 DIAGNOSIS — D380 Neoplasm of uncertain behavior of larynx: Secondary | ICD-10-CM | POA: Diagnosis not present

## 2019-03-26 DIAGNOSIS — Z20828 Contact with and (suspected) exposure to other viral communicable diseases: Secondary | ICD-10-CM | POA: Insufficient documentation

## 2019-03-26 HISTORY — DX: Other diseases of vocal cords: J38.3

## 2019-03-26 HISTORY — PX: MICROLARYNGOSCOPY: SHX5208

## 2019-03-26 SURGERY — MICROLARYNGOSCOPY
Anesthesia: General | Site: Throat | Laterality: Left

## 2019-03-26 MED ORDER — SUCCINYLCHOLINE CHLORIDE 200 MG/10ML IV SOSY
PREFILLED_SYRINGE | INTRAVENOUS | Status: AC
  Filled 2019-03-26: qty 10

## 2019-03-26 MED ORDER — MIDAZOLAM HCL 2 MG/2ML IJ SOLN: 1.0000 mg | INTRAMUSCULAR | Status: DC | PRN

## 2019-03-26 MED ORDER — ONDANSETRON HCL 4 MG/2ML IJ SOLN
INTRAMUSCULAR | Status: DC | PRN
  Administered 2019-03-26: 4 mg via INTRAVENOUS

## 2019-03-26 MED ORDER — SCOPOLAMINE 1 MG/3DAYS TD PT72: 1.0000 | MEDICATED_PATCH | Freq: Once | TRANSDERMAL | Status: DC

## 2019-03-26 MED ORDER — SUCCINYLCHOLINE CHLORIDE 20 MG/ML IJ SOLN
INTRAMUSCULAR | Status: DC | PRN
  Administered 2019-03-26: 60 mg via INTRAVENOUS

## 2019-03-26 MED ORDER — DEXAMETHASONE SODIUM PHOSPHATE 4 MG/ML IJ SOLN
INTRAMUSCULAR | Status: DC | PRN
  Administered 2019-03-26: 5 mg via INTRAVENOUS

## 2019-03-26 MED ORDER — ONDANSETRON HCL 4 MG/2ML IJ SOLN: 4.0000 mg | Freq: Once | INTRAMUSCULAR | Status: DC | PRN

## 2019-03-26 MED ORDER — ACETAMINOPHEN 10 MG/ML IV SOLN
INTRAVENOUS | Status: DC | PRN
  Administered 2019-03-26: 750 mg via INTRAVENOUS

## 2019-03-26 MED ORDER — FENTANYL CITRATE (PF) 100 MCG/2ML IJ SOLN
50.0000 ug | INTRAMUSCULAR | Status: AC | PRN
  Administered 2019-03-26: 50 ug via INTRAVENOUS
  Administered 2019-03-26 (×2): 25 ug via INTRAVENOUS

## 2019-03-26 MED ORDER — CEFAZOLIN SODIUM-DEXTROSE 2-3 GM-%(50ML) IV SOLR
INTRAVENOUS | Status: DC | PRN
  Administered 2019-03-26: 2 g via INTRAVENOUS

## 2019-03-26 MED ORDER — DEXAMETHASONE SODIUM PHOSPHATE 10 MG/ML IJ SOLN
INTRAMUSCULAR | Status: AC
  Filled 2019-03-26: qty 1

## 2019-03-26 MED ORDER — PHENYLEPHRINE 40 MCG/ML (10ML) SYRINGE FOR IV PUSH (FOR BLOOD PRESSURE SUPPORT)
PREFILLED_SYRINGE | INTRAVENOUS | Status: AC
  Filled 2019-03-26: qty 10

## 2019-03-26 MED ORDER — PROPOFOL 10 MG/ML IV BOLUS
INTRAVENOUS | Status: DC | PRN
  Administered 2019-03-26: 120 mg via INTRAVENOUS

## 2019-03-26 MED ORDER — MIDAZOLAM HCL 2 MG/2ML IJ SOLN
INTRAMUSCULAR | Status: AC
  Filled 2019-03-26: qty 2

## 2019-03-26 MED ORDER — CEFAZOLIN SODIUM-DEXTROSE 2-4 GM/100ML-% IV SOLN
INTRAVENOUS | Status: AC
  Filled 2019-03-26: qty 100

## 2019-03-26 MED ORDER — LIDOCAINE 2% (20 MG/ML) 5 ML SYRINGE
INTRAMUSCULAR | Status: AC
  Filled 2019-03-26: qty 5

## 2019-03-26 MED ORDER — ONDANSETRON HCL 4 MG/2ML IJ SOLN
INTRAMUSCULAR | Status: AC
  Filled 2019-03-26: qty 2

## 2019-03-26 MED ORDER — EPHEDRINE 5 MG/ML INJ
INTRAVENOUS | Status: AC
  Filled 2019-03-26: qty 10

## 2019-03-26 MED ORDER — EPINEPHRINE PF 1 MG/ML IJ SOLN
INTRAMUSCULAR | Status: AC
  Filled 2019-03-26: qty 1

## 2019-03-26 MED ORDER — FENTANYL CITRATE (PF) 100 MCG/2ML IJ SOLN
INTRAMUSCULAR | Status: AC
  Filled 2019-03-26: qty 2

## 2019-03-26 MED ORDER — FENTANYL CITRATE (PF) 100 MCG/2ML IJ SOLN
25.0000 ug | INTRAMUSCULAR | Status: DC | PRN
  Administered 2019-03-26: 25 ug via INTRAVENOUS

## 2019-03-26 MED ORDER — EPINEPHRINE PF 1 MG/ML IJ SOLN
INTRAMUSCULAR | Status: DC | PRN
  Administered 2019-03-26: 1 mg

## 2019-03-26 MED ORDER — EPINEPHRINE PF 1 MG/ML IJ SOLN
INTRAMUSCULAR | Status: AC
  Filled 2019-03-26: qty 4

## 2019-03-26 MED ORDER — LACTATED RINGERS IV SOLN
INTRAVENOUS | Status: DC
  Administered 2019-03-26: 08:00:00 via INTRAVENOUS

## 2019-03-26 MED ORDER — LIDOCAINE-EPINEPHRINE 1 %-1:100000 IJ SOLN
INTRAMUSCULAR | Status: AC
  Filled 2019-03-26: qty 1

## 2019-03-26 SURGICAL SUPPLY — 23 items
CANISTER SUCT 1200ML W/VALVE (MISCELLANEOUS) ×3 IMPLANT
COVER WAND RF STERILE (DRAPES) IMPLANT
DRAPE HALF SHEET 70X43 (DRAPES) ×3 IMPLANT
GAUZE SPONGE 4X4 12PLY STRL LF (GAUZE/BANDAGES/DRESSINGS) ×3 IMPLANT
GLOVE BIO SURGEON STRL SZ7.5 (GLOVE) ×3 IMPLANT
GOWN STRL REUS W/ TWL LRG LVL3 (GOWN DISPOSABLE) ×1 IMPLANT
GOWN STRL REUS W/TWL LRG LVL3 (GOWN DISPOSABLE) ×2
GUARD TEETH (MISCELLANEOUS) ×3 IMPLANT
MARKER SKIN DUAL TIP RULER LAB (MISCELLANEOUS) ×3 IMPLANT
NDL SAFETY ECLIPSE 18X1.5 (NEEDLE) ×1 IMPLANT
NDL SPNL 22GX7 QUINCKE BK (NEEDLE) IMPLANT
NEEDLE HYPO 18GX1.5 SHARP (NEEDLE) ×2
NEEDLE SPNL 22GX7 QUINCKE BK (NEEDLE) IMPLANT
NS IRRIG 1000ML POUR BTL (IV SOLUTION) ×3 IMPLANT
PACK BASIN DAY SURGERY FS (CUSTOM PROCEDURE TRAY) ×3 IMPLANT
PATTIES SURGICAL .5 X3 (DISPOSABLE) ×3 IMPLANT
SLEEVE SCD COMPRESS KNEE MED (MISCELLANEOUS) ×3 IMPLANT
SOLUTION BUTLER CLEAR DIP (MISCELLANEOUS) ×3 IMPLANT
SYR CONTROL 10ML LL (SYRINGE) ×3 IMPLANT
SYR TB 1ML LL NO SAFETY (SYRINGE) ×3 IMPLANT
TOWEL GREEN STERILE FF (TOWEL DISPOSABLE) ×3 IMPLANT
TUBE CONNECTING 20'X1/4 (TUBING) ×1
TUBE CONNECTING 20X1/4 (TUBING) ×2 IMPLANT

## 2019-03-26 NOTE — Transfer of Care (Signed)
Immediate Anesthesia Transfer of Care Note  Patient: Jean [name]  Procedure(s) Performed: DIRECT MICROLARYNGOSCOPY WITH BIOPSY OF VOCAL CORD MASS (Left Throat)  Patient Location: PACU  Anesthesia Type:General  Level of Consciousness: awake, alert  and oriented  Airway & Oxygen Therapy: Patient Spontanous Breathing and Patient connected to face mask oxygen  Post-op Assessment: Report given to RN and Post -op Vital signs reviewed and stable  Post vital signs: Reviewed and stable  Last Vitals:  Vitals Value Taken Time  BP 133/76 03/26/19 0903  Temp    Pulse 83 03/26/19 0906  Resp 17 03/26/19 0906  SpO2 100 % 03/26/19 0906  Vitals shown include unvalidated device data.  Last Pain:  Vitals:   03/26/19 0742  TempSrc: Oral  PainSc: 0-No pain      Patients Stated Pain Goal: 3 ([id number])  Complications: No apparent anesthesia complications

## 2019-03-26 NOTE — Anesthesia Preprocedure Evaluation (Addendum)
Anesthesia Evaluation  Patient identified by MRN, date of birth, ID band Patient awake  General Assessment Comment:Vocal cord mass  Reviewed: Allergy & Precautions, NPO status , Patient's Chart, lab work & pertinent test results  Airway Mallampati: I  TM Distance: >3 FB Neck ROM: Full    Dental  (+) Dental Advisory Given, Caps, Missing   Pulmonary neg pulmonary ROS, former smoker,    Pulmonary exam normal breath sounds clear to auscultation       Cardiovascular hypertension, Pt. on medications Normal cardiovascular exam Rhythm:Regular Rate:Normal     Neuro/Psych negative neurological ROS     GI/Hepatic negative GI ROS, Neg liver ROS,   Endo/Other  negative endocrine ROS  Renal/GU negative Renal ROS     Musculoskeletal  (+) Arthritis ,   Abdominal   Peds  Hematology negative hematology ROS (+)   Anesthesia Other Findings Day of surgery medications reviewed with the patient.  Reproductive/Obstetrics                            Anesthesia Physical Anesthesia Plan  ASA: II  Anesthesia Plan: General   Post-op Pain Management:    Induction: Intravenous  PONV Risk Score and Plan: 4 or greater and Dexamethasone, Ondansetron and Treatment may vary due to age or medical condition  Airway Management Planned: Oral ETT  Additional Equipment:   Intra-op Plan:   Post-operative Plan: Extubation in OR  Informed Consent: I have reviewed the patients History and Physical, chart, labs and discussed the procedure including the risks, benefits and alternatives for the proposed anesthesia with the patient or authorized representative who has indicated his/her understanding and acceptance.     Dental advisory given  Plan Discussed with: CRNA  Anesthesia Plan Comments:        Anesthesia Quick Evaluation

## 2019-03-26 NOTE — Discharge Instructions (Signed)
May take next dose of Tylenol at 2:45 PM on 03/26/2019 if needed.    Post Anesthesia Home Care Instructions  Activity: Get plenty of rest for the remainder of the day. A responsible individual must stay with you for 24 hours following the procedure.  For the next 24 hours, DO NOT: -Drive a car -Paediatric nurse -Drink alcoholic beverages -Take any medication unless instructed by your physician -Make any legal decisions or sign important papers.  Meals: Start with liquid foods such as gelatin or soup. Progress to regular foods as tolerated. Avoid greasy, spicy, heavy foods. If nausea and/or vomiting occur, drink only clear liquids until the nausea and/or vomiting subsides. Call your physician if vomiting continues.  Special Instructions/Symptoms: Your throat may feel dry or sore from the anesthesia or the breathing tube placed in your throat during surgery. If this causes discomfort, gargle with warm salt water. The discomfort should disappear within 24 hours.  If you had a scopolamine patch placed behind your ear for the management of post- operative nausea and/or vomiting:  1. The medication in the patch is effective for 72 hours, after which it should be removed.  Wrap patch in a tissue and discard in the trash. Wash hands thoroughly with soap and water. 2. You may remove the patch earlier than 72 hours if you experience unpleasant side effects which may include dry mouth, dizziness or visual disturbances. 3. Avoid touching the patch. Wash your hands with soap and water after contact with the patch.        Call your surgeon if you experience:   1.  Fever over 101.0. 2.  Inability to urinate. 3.  Nausea and/or vomiting. 4.  Extreme swelling or bruising at the surgical site. 5.  Continued bleeding from the incision. 6.  Increased pain, redness or drainage from the incision. 7.  Problems related to your pain medication. 8.  Any problems and/or concerns

## 2019-03-26 NOTE — Anesthesia Procedure Notes (Signed)
Procedure Name: Intubation Date/Time: 03/26/2019 8:37 AM Performed by: Willa Frater, CRNA Pre-anesthesia Checklist: Patient identified, Emergency Drugs available, Suction available and Patient being monitored Patient Re-evaluated:Patient Re-evaluated prior to induction Oxygen Delivery Method: Circle system utilized Preoxygenation: Pre-oxygenation with 100% oxygen Induction Type: IV induction Ventilation: Mask ventilation without difficulty Laryngoscope Size: Mac and 3 Grade View: Grade I Tube type: Oral Tube size: 6.0 mm Number of attempts: 1 Airway Equipment and Method: Stylet and Oral airway Placement Confirmation: ETT inserted through vocal cords under direct vision,  positive ETCO2 and breath sounds checked- equal and bilateral Secured at: 22 cm Tube secured with: Tape Dental Injury: Teeth and Oropharynx as per pre-operative assessment

## 2019-03-26 NOTE — H&P (Signed)
Cc: Chronic hoarseness  HPI: The patient is a 73 year old female who presents today complaining of chronic hoarseness since the beginning of this year.  She describes her hoarseness as a constant raspy voice.  She also reports occasional globus sensation.  She denies any dysphagia, odynophagia or dyspnea.  The patient quit the use of tobacco in 2005.  She denies any significant recent weight loss.  The patient is retired.  She denies any voice overuse.  She also denies any history of gastroesophageal reflux.   The patient's review of systems (constitutional, eyes, ENT, cardiovascular, respiratory, GI, musculoskeletal, skin, neurologic, psychiatric, endocrine, hematologic, allergic) is noted in the ROS questionnaire.  It is reviewed with the patient.  Family health history: Hypertension.  Major events: Ectopic pregnancy X2.  Ongoing medical problems: Hypertension, rheumatoid arthritis.  Social history: The patient is single. She is a former smoker. She denies the use of alcohol or illegal drugs.    Exam General: Communicates without difficulty, well nourished, no acute distress. Head: Normocephalic, no evidence injury, no tenderness, facial buttresses intact without stepoff. Face/sinus: No tenderness to palpation and percussion. Facial movement is normal and symmetric. Eyes: PERRL, EOMI. No scleral icterus, conjunctivae clear. Neuro: CN II exam reveals vision grossly intact.  No nystagmus at any point of gaze. Ears: Auricles well formed without lesions.  Ear canals are intact without mass or lesion.  No erythema or edema is appreciated.  The TMs are intact without fluid. Nose: External evaluation reveals normal support and skin without lesions.  Dorsum is intact.  Anterior rhinoscopy reveals pink mucosa over anterior aspect of inferior turbinates and intact septum.  No purulence noted. Oral:  Oral cavity and oropharynx are intact, symmetric, without erythema or edema.  Mucosa is moist without lesions.  Neck: Full range of motion without pain.  There is no significant lymphadenopathy.  No masses palpable.  Thyroid bed within normal limits to palpation.  Parotid glands and submandibular glands equal bilaterally without mass.  Trachea is midline. Neuro:  CN 2-12 grossly intact. Gait normal.   Procedure:  Flexible Fiberoptic Laryngoscopy Risks, benefits, and alternatives of flexible endoscopy were explained to the patient.  Specific mention was made of the risk of throat numbness with difficulty swallowing, possible bleeding from the nose and mouth, and pain from the procedure.  The patient gave oral consent to proceed.  The nasal cavities were decongested and anesthetised with a combination of oxymetazoline and 4% lidocaine solution.  The flexible scope was inserted into the right nasal cavity and advanced towards the nasopharynx.  Visualized mucosa over the turbinates and septum were as described above.  The nasopharynx was clear.  Oropharyngeal walls were symmetric and mobile without lesion, mass, or edema.  Hypopharynx was also without  lesion or edema.   Supraglottic structures were free of edema, mass, and asymmetry.  A large polypoid tissue was noted to cover the left vocal cord.  Base of tongue was within normal limits.   Assessment 1.  The patient is noted to have a large polypoid tissue covering her left vocal cord.  This is likely the cause of her chronic hoarseness.  No other suspicious mass or lesion is noted on today's examination.   Plan  1.  The physical exam and laryngoscopy findings are reviewed with the patient.  2.  We will proceed with microdirect laryngoscopy with excision of the left vocal cord polyp.  3.  The risks, benefits, alternatives and details of the procedure are extensively reviewed with the patient.  Questions are invited and answered.            4.  The patient would like to proceed with the procedure.

## 2019-03-26 NOTE — Op Note (Signed)
DATE OF PROCEDURE:  03/26/2019                              OPERATIVE REPORT  SURGEON:  Leta Baptist, MD  PREOPERATIVE DIAGNOSES: 1. Chronic hoarseness 2. Left vocal cord mass  POSTOPERATIVE DIAGNOSES: 1. Chronic hoarseness 2. Left vocal cord mass  PROCEDURE PERFORMED:  Microdirect laryngoscopy with excision of left vocal cord mass  ANESTHESIA:  General endotracheal tube anesthesia.  COMPLICATIONS:  None.  ESTIMATED BLOOD LOSS:  Minimal.  INDICATION FOR PROCEDURE:  Jean [name] is a 73 y.o. female with a history of chronic hoarseness.  On examination, the patient was noted to have a large polypoid tissue covering her left vocal cord.  The patient is a former smoker.  She quit the use of tobacco in 2005.  Based on the above findings, the decision was made for the patient to undergo the above-stated procedure.  The risks, benefits, alternatives, and details of the procedure were discussed with the patient  .  Questions were invited and answered.  Informed consent was obtained.  DESCRIPTION:  The patient was taken to the operating room and placed supine on the operating table.  General endotracheal tube anesthesia was administered by the anesthesiologist.  The patient was positioned and prepped and draped in a standard fashion for direct laryngoscopy.   A Dedo laryngoscope was used for the examination.  The laryngoscope was inserted via the oral cavity into the pharynx.  Examination of the vallecula, epiglottis, aryepiglottic folds, and piriform sinuses were all normal.  Examination of the vocal cords revealed a large soft tissue mass covering her left vocal cord.  The Dedo laryngoscope was suspended with the Lewy suspender.  An operating microscope was brought into the field.  Under the operating microscope, the soft tissue mass was removed in a piecemeal fashion.  However, the mass was noted to have invaded into the vocalis muscle.  The findings were concerning for malignancy.  The  specimens were sent to the pathology department for permanent histologic identification.  Hemostasis was achieved with pledgets soaked with epinephrine.  The Dedo laryngoscope was withdrawn.  The care of the patient was turned over to the anesthesiologist.  The patient was awakened from anesthesia without difficulty.  The patient was extubated and transferred to the recovery room in good condition.  OPERATIVE FINDINGS: Left vocal cord mass.  The mass has invaded into the vocalis muscle.  SPECIMEN: Left vocal cord mass.  FOLLOWUP CARE:  The patient will be discharged home once awake and alert.  The patient will follow up in my office in approximately 1 week.  Amiria Orrison W Sallyanne Birkhead 03/26/2019 9:08 AM

## 2019-03-27 ENCOUNTER — Encounter (HOSPITAL_BASED_OUTPATIENT_CLINIC_OR_DEPARTMENT_OTHER): Payer: Self-pay | Admitting: Otolaryngology

## 2019-03-27 LAB — SURGICAL PATHOLOGY

## 2019-03-27 NOTE — Anesthesia Postprocedure Evaluation (Signed)
Anesthesia Post Note  Patient: Jean [name]  Procedure(s) Performed: DIRECT MICROLARYNGOSCOPY WITH BIOPSY OF VOCAL CORD MASS (Left Throat)     Patient location during evaluation: PACU Anesthesia Type: General Level of consciousness: awake and alert Pain management: pain level controlled Vital Signs Assessment: post-procedure vital signs reviewed and stable Respiratory status: spontaneous breathing, nonlabored ventilation and respiratory function stable Cardiovascular status: blood pressure returned to baseline and stable Postop Assessment: no apparent nausea or vomiting Anesthetic complications: no    Last Vitals:  Vitals:   03/26/19 0930 03/26/19 1011  BP: (!) 148/78 138/71  Pulse: 80 72  Resp: 17 18  Temp:  36.7 C  SpO2: 100% 100%    Last Pain:  Vitals:   03/27/19 0838  TempSrc:   PainSc: 0-No pain                 Catalina Gravel

## 2019-04-03 ENCOUNTER — Telehealth: Payer: Self-pay | Admitting: Hematology

## 2019-04-03 ENCOUNTER — Other Ambulatory Visit (HOSPITAL_COMMUNITY): Payer: Self-pay | Admitting: Otolaryngology

## 2019-04-03 ENCOUNTER — Other Ambulatory Visit: Payer: Self-pay | Admitting: Otolaryngology

## 2019-04-03 DIAGNOSIS — C320 Malignant neoplasm of glottis: Secondary | ICD-10-CM

## 2019-04-03 NOTE — Telephone Encounter (Signed)
Spoke with patient to confirm new patient appt date/time/location for 10/19 at 0830

## 2019-04-05 ENCOUNTER — Telehealth: Payer: Self-pay | Admitting: *Deleted

## 2019-04-05 NOTE — Telephone Encounter (Signed)
Oncology Nurse Navigator Documentation  Placed introductory call to new referral patient Jean [name].  Introduced myself as the H&N oncology nurse navigator that works with Drs. Isidore Moos and Maylon Peppers to whom she has been referred by Dr. Benjamine Mola.  She confirmed understanding of referral.  Briefly explained my role as her navigator, provided my contact information.   Confirmed understanding of upcoming appt with Dr. Maylon Peppers in [facility] and PET at [facility].  Answered questions re XRT/tmt timeframe.  Of Note:    Prefers Wed/Fri am appts when possible so dtr can participate.  Has own teeth  I explained the purpose of a dental evaluation prior to starting RT, indicated not typical for laryngeal cancers but referral may be appropriate depending on findings of PET scan.    I encouraged her to call with questions/concerns as she moves forward with appts and procedures.    She verbalized understanding of information provided, expressed appreciation for my call.  Navigator Initial Assessment . 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 Currently on FMLA / STD: no . Living situation:  lives with dtr. . Support System:  dtr, church members . PCP:  Charolette Forward . PCD:  [employer] . Transportation Needs: no . Sensory Deficits/Language Barriers/Interpreter Needed:  no . Ambulation Needs: no . DME Used in Home: no . Psychosocial Needs: no  . Concerns/Needs Understanding Cancer: addressed/answered by navigator . Self-Expressed Needs: no

## 2019-04-06 ENCOUNTER — Other Ambulatory Visit: Payer: Self-pay | Admitting: Hematology

## 2019-04-06 DIAGNOSIS — C320 Malignant neoplasm of glottis: Secondary | ICD-10-CM | POA: Insufficient documentation

## 2019-04-06 NOTE — Progress Notes (Addendum)
[place] NOTE  Patient Care Team: Charolette Forward, MD as PCP - General (Cardiology) Wyatt Portela, MD as Consulting Physician (Oncology) Charolette Forward, MD as Referring Physician (Cardiology) Eppie Gibson, MD as Attending Physician (Radiation Oncology) Leota Sauers, RN as Oncology Nurse Navigator  HEME/ONC OVERVIEW: 1. 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 Rheumatoid arthritis on Humira and methotrexate   ASSESSMENT & PLAN:   Squamous cell carcinoma of the left vocal cord, stage TBD -I reviewed the patient's records in detail, including external ENT clinic notes, lab studies, and the pathology reports -In summary, patient presented with Dr. Benjamine Mola of ENT in mid-02/2019 for evaluation of hoarseness of voice since the beginning of the year.  She underwent laryngoscopy, which showed a large polypoid tissue covering the left vocal cord, possibly invading the vocalis muscle.  The biopsy of vocal cord mass showed squamous cell carcinoma.  Patient was referred to oncology for further evaluation. -I reviewed the pathology results in detail with the patient -I also reviewed the NCCN guideline detail with the patient -As the patient has not yet had any staging scans, I have ordered CT neck with contrast and PET to assess the extent of the disease (PET scheduled on 04/10/2019, CT neck TBD) -I discussed with the patient briefly that treatment for head and neck cancers requires multidisciplinary approach, including ENT, radiation oncology, and medical oncology.  The specific treatment is based on the individual patient's disease characteristics, as well as medical comorbidities. -I have  referred the patient to radiation oncology for discussion of radiation treatment -In addition, I have requested the case to be discussed at the next H&N tumor board  -Pending the staging scans above, we will determine the next step  Macrocytic anemia -Likely due to methotrexate -Hgb 11.6 with MCV 101, fluctuating -Clinically, patient denies any symptoms of bleeding -I have ordered iron profile, B12 and folate for the next visit  -We will monitor it for now  Thrombocytosis -Likely secondary to rheumatoid arthritis -Plts 428k today, mildly persistently elevated -We will monitor it for now   Orders Placed This Encounter  Procedures  . CT SOFT TISSUE NECK W CONTRAST    Standing Status:   Future    Standing Expiration Date:   04/08/2020    Order Specific Question:   If indicated for the ordered procedure, I authorize the administration of contrast media per Radiology protocol    Answer:   Yes    Order Specific Question:   Preferred imaging location?    Answer:   [facility]    Order Specific Question:   Radiology Contrast Protocol - do NOT remove file path    Answer:   [email or web address]  . CBC with Differential ([place] Only)    Standing Status:   Future    Standing Expiration Date:   05/13/2020  . CMP ([place] only)    Standing Status:   Future    Standing Expiration Date:   05/13/2020  . Folate    Standing Status:   Future    Standing Expiration Date:   05/13/2020  . Vitamin B12    Standing Status:   Future    Standing Expiration Date:   05/13/2020  .  Ferritin    Standing Status:   Future    Standing Expiration Date:   05/13/2020  . Iron and TIBC    Standing Status:   Future    Standing Expiration Date:   05/13/2020  . Soluble transferrin receptor    Standing Status:   Future    Standing Expiration Date:   04/08/2020   All questions were answered. The patient knows to call the clinic with any problems, questions or  concerns.  Return on 04/18/2019 for labs, imaging results and clinic appt.  Tish Men, MD 04/09/2019 9:36 AM   CHIEF COMPLAINTS/PURPOSE OF CONSULTATION:  "My voice is still hoarse"  HISTORY OF PRESENTING ILLNESS:  Jean [name] 73 y.o. female is here because of newly diagnosed squamous cell carcinoma of the left vocal cord.  Patient reports that she first began to notice some hoarseness of voice in January 2020.  At that time, her husband had been placed on hospice for presumed metastatic cancer of GI origin, and she was providing full-time care for him.  As a result, she had delayed any evaluation until her husband passed away recently.  patient presented with Dr. Benjamine Mola of ENT in mid-02/2019 for evaluation of hoarseness of voice, and underwent laryngoscopy, which showed a large polypoid tissue covering the left vocal cord, possibly invading the vocalis muscle.  The biopsy of vocal cord mass showed squamous cell carcinoma.  Patient was referred to oncology for further evaluation.  She reports that hoarseness of voice has become constant, but she denies any fever, night sweats, weight loss, lymphadenopathy, dysphagia, or odynophagia.  She is on Humira and methotrexate for rheumatoid arthritis.  She had a history of smoking, up to 1 pack/day for 12 to 15 years, but she quit in 2005.  She drinks occasionally.  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  She denies any other complaint today.  REVIEW OF SYSTEMS:   Constitutional: ( - ) fevers, ( - )  chills , ( - ) night sweats Eyes: ( - ) blurriness of vision, ( - ) double vision, ( - ) watery eyes Ears, nose, mouth, throat, and face: ( - ) mucositis, ( - ) sore throat Respiratory: ( - ) cough, ( - ) dyspnea, ( - ) wheezes Cardiovascular: ( - ) palpitation, ( - ) chest discomfort, ( - ) lower extremity swelling Gastrointestinal:  ( - ) nausea, ( - ) heartburn, ( - ) change in bowel habits Skin: ( - ) abnormal skin  rashes Lymphatics: ( - ) new lymphadenopathy, ( - ) easy bruising Neurological: ( - ) numbness, ( - ) tingling, ( - ) new weaknesses Behavioral/Psych: ( - ) mood change, ( - ) new changes  All other systems were reviewed with the patient and are negative.  I have reviewed her chart and materials related to her cancer extensively and collaborated history with the patient. Summary of oncologic history is as follows: Oncology History  Squamous cell carcinoma of left vocal cord ([place])  03/26/2019 Pathology Results   DIAGNOSIS:   A. VOCAL CORD MASS, LEFT, EXCISION:  - Squamous cell carcinoma.  - See comment.    04/06/2019 Initial Diagnosis   Squamous cell carcinoma of left vocal cord [facility])     MEDICAL HISTORY:  Past Medical History:  Diagnosis Date  . Arthritis   . Hypertension   . Vocal cord mass     SURGICAL HISTORY: Past Surgical History:  Procedure Laterality Date  .  ECTOPIC PREGNANCY SURGERY    . EXTERNAL EAR SURGERY Right    removal of cyst or gland  . MICROLARYNGOSCOPY Left 03/26/2019   Procedure: DIRECT MICROLARYNGOSCOPY WITH BIOPSY OF VOCAL CORD MASS;  Surgeon: Leta Baptist, MD;  Location: [place];  Service: ENT;  Laterality: Left;    SOCIAL HISTORY: Social History   Socioeconomic History  . Marital status: Married    Spouse name: Not on file  . Number of children: Not on file  . Years of education: Not on file  . Highest education level: Not on file  Occupational History  . Not on file  Social Needs  . Financial resource strain: Not on file  . Food insecurity    Worry: Not on file    Inability: Not on file  . Transportation needs    Medical: Not on file    Non-medical: Not on file  Tobacco Use  . Smoking status: Former Smoker    Quit date: 11/08/2003    Years since quitting: 15.4  . Smokeless tobacco: Never Used  Substance and Sexual Activity  . Alcohol use: Not Currently  . Drug use: No  . Sexual activity: Not on file  Lifestyle  .  Physical activity    Days per week: Not on file    Minutes per session: Not on file  . Stress: Not on file  Relationships  . Social Herbalist on phone: Not on file    Gets together: Not on file    Attends religious service: Not on file    Active member of club or organization: Not on file    Attends meetings of clubs or organizations: Not on file    Relationship status: Not on file  . Intimate partner violence    Fear of current or ex partner: Not on file    Emotionally abused: Not on file    Physically abused: Not on file    Forced sexual activity: Not on file  Other Topics Concern  . Not on file  Social History Narrative  . Not on file    FAMILY HISTORY: Family History  Problem Relation Age of Onset  . Hypertension Daughter     ALLERGIES:  is allergic to sulfa antibiotics.  MEDICATIONS:  Current Outpatient Medications  Medication Sig Dispense Refill  . Adalimumab (HUMIRA PEN) 40 MG/0.4ML PNKT Inject 40 mg into the skin every 14 (fourteen) days. (Patient taking differently: Inject 40 mg into the skin every 21 ( twenty-one) days. ) 3 each 0  . ALLERGIST TRAY 1CC 27GX1/2" 27G X 1/2" 1 ML KIT TO BE USED WITH WEEKLY METHOTREXATE INJECTIONS ONCE A WEEK 25 each 0  . amLODipine (NORVASC) 5 MG tablet Take 5 mg by mouth daily.  0  . Cholecalciferol (VITAMIN D) 50 MCG (2000 UT) CAPS Take by mouth daily.    . folic acid (FOLVITE) 1 MG tablet TAKE 2 TABLETS(2 MG) BY MOUTH DAILY 180 tablet 3  . losartan (COZAAR) 100 MG tablet Take 100 mg by mouth daily.   0  . methotrexate 50 MG/2ML injection INJECT 0.6 ML(15 MG TOTAL) UNDER THE SKIN ONCE A WEEK 8 mL 0  . ondansetron (ZOFRAN) 4 MG tablet Take 1 tablet (4 mg total) by mouth every 6 (six) hours as needed for nausea or vomiting. 30 tablet 2   No current facility-administered medications for this visit.     PHYSICAL EXAMINATION: ECOG PERFORMANCE STATUS: 1 - Symptomatic but completely ambulatory  Vitals:  04/09/19 0844   BP: (!) 141/67  Pulse: 89  Resp: 17  Temp: 97.6 F (36.4 C)  SpO2: 100%   Filed Weights   04/09/19 0844  Weight: 115 lb (52.2 kg)    GENERAL: alert, no distress and comfortable, slightly thin  SKIN: skin color, texture, turgor are normal, no rashes or significant lesions EYES: conjunctiva are pink and non-injected, sclera clear OROPHARYNX: no exudate, no erythema; lips, buccal mucosa, and tongue normal  NECK: supple, non-tender LYMPH:  no palpable lymphadenopathy in the cervical LUNGS: clear to auscultation with normal breathing effort HEART: regular rate & rhythm, no murmurs, no lower extremity edema ABDOMEN: soft, non-tender, non-distended, normal bowel sounds Musculoskeletal: no cyanosis of digits and no clubbing  PSYCH: alert & oriented x 3, fluent speech NEURO: no focal motor/sensory deficits  LABORATORY DATA:  I have reviewed the data as listed Lab Results  Component Value Date   WBC 7.4 04/09/2019   HGB 11.6 (L) 04/09/2019   HCT 36.7 04/09/2019   MCV 101.1 (H) 04/09/2019   PLT 428 (H) 04/09/2019   Lab Results  Component Value Date   NA 140 04/09/2019   K 3.9 04/09/2019   CL 106 04/09/2019   CO2 26 04/09/2019    RADIOGRAPHIC STUDIES: I have personally reviewed the radiological images as listed and agreed with the findings in the report. No results found.  PATHOLOGY: I have reviewed the pathology reports as documented in the oncologist history.

## 2019-04-09 ENCOUNTER — Encounter: Payer: Self-pay | Admitting: Hematology

## 2019-04-09 ENCOUNTER — Telehealth: Payer: Self-pay | Admitting: Student

## 2019-04-09 ENCOUNTER — Telehealth: Payer: Self-pay | Admitting: Hematology

## 2019-04-09 ENCOUNTER — Inpatient Hospital Stay: Payer: Medicare Other | Attending: Hematology | Admitting: Hematology

## 2019-04-09 ENCOUNTER — Inpatient Hospital Stay: Payer: Medicare Other

## 2019-04-09 ENCOUNTER — Other Ambulatory Visit: Payer: Self-pay

## 2019-04-09 VITALS — BP 141/67 | HR 89 | Temp 97.6°F | Resp 17 | Ht 63.0 in | Wt 115.0 lb

## 2019-04-09 DIAGNOSIS — D539 Nutritional anemia, unspecified: Secondary | ICD-10-CM | POA: Insufficient documentation

## 2019-04-09 DIAGNOSIS — D75839 Thrombocytosis, unspecified: Secondary | ICD-10-CM

## 2019-04-09 DIAGNOSIS — M069 Rheumatoid arthritis, unspecified: Secondary | ICD-10-CM | POA: Insufficient documentation

## 2019-04-09 DIAGNOSIS — C320 Malignant neoplasm of glottis: Secondary | ICD-10-CM

## 2019-04-09 DIAGNOSIS — D473 Essential (hemorrhagic) thrombocythemia: Secondary | ICD-10-CM | POA: Diagnosis not present

## 2019-04-09 DIAGNOSIS — Z87891 Personal history of nicotine dependence: Secondary | ICD-10-CM | POA: Insufficient documentation

## 2019-04-09 LAB — CMP (CANCER CENTER ONLY)
ALT: 20 U/L (ref 0–44)
AST: 16 U/L (ref 15–41)
Albumin: 4.2 g/dL (ref 3.5–5.0)
Alkaline Phosphatase: 96 U/L (ref 38–126)
Anion gap: 8 (ref 5–15)
BUN: 14 mg/dL (ref 8–23)
CO2: 26 mmol/L (ref 22–32)
Calcium: 9.5 mg/dL (ref 8.9–10.3)
Chloride: 106 mmol/L (ref 98–111)
Creatinine: 0.75 mg/dL (ref 0.44–1.00)
GFR, Est AFR Am: >60 mL/min (ref >60)
GFR, Estimated: >60 mL/min (ref >60)
Glucose, Bld: 99 mg/dL (ref 70–99)
Potassium: 3.9 mmol/L (ref 3.5–5.1)
Sodium: 140 mmol/L (ref 135–145)
Total Bilirubin: 0.3 mg/dL (ref 0.3–1.2)
Total Protein: 6.8 g/dL (ref 6.5–8.1)

## 2019-04-09 LAB — CBC WITH DIFFERENTIAL (CANCER CENTER ONLY)
Abs Immature Granulocytes: 0.02 10*3/uL (ref 0.00–0.07)
Basophils Absolute: 0 10*3/uL (ref 0.0–0.1)
Basophils Relative: 0 %
Eosinophils Absolute: 0.1 10*3/uL (ref 0.0–0.5)
Eosinophils Relative: 1 %
HCT: 36.7 % (ref 36.0–46.0)
Hemoglobin: 11.6 g/dL — ABNORMAL LOW (ref 12.0–15.0)
Immature Granulocytes: 0 %
Lymphocytes Relative: 21 %
Lymphs Abs: 1.5 10*3/uL (ref 0.7–4.0)
MCH: 32 pg (ref 26.0–34.0)
MCHC: 31.6 g/dL (ref 30.0–36.0)
MCV: 101.1 fL — ABNORMAL HIGH (ref 80.0–100.0)
Monocytes Absolute: 0.5 10*3/uL (ref 0.1–1.0)
Monocytes Relative: 7 %
Neutro Abs: 5.3 10*3/uL (ref 1.7–7.7)
Neutrophils Relative %: 71 %
Platelet Count: 428 10*3/uL — ABNORMAL HIGH (ref 150–400)
RBC: 3.63 MIL/uL — ABNORMAL LOW (ref 3.87–5.11)
RDW: 13.5 % (ref 11.5–15.5)
WBC Count: 7.4 10*3/uL (ref 4.0–10.5)
nRBC: 0 % (ref 0.0–0.2)

## 2019-04-09 NOTE — Telephone Encounter (Signed)
Called and LMVM for patient w/ date/time pf CT scan appointment added

## 2019-04-09 NOTE — Telephone Encounter (Signed)
Appointments scheduled calendar pritned per 10/19 los

## 2019-04-09 NOTE — Patient Instructions (Signed)
Throat Cancer  Throat cancer (oropharyngeal cancer) is an abnormal growth of cancerous cells (malignant tumors) in the throat. Tumors may affect the tonsils, the back of the throat (pharynx), or the voice box (larynx). What are the causes? The exact cause of throat cancer is not known. What increases the risk? You are more likely to develop throat cancer if you:  Use any tobacco products, including cigarettes, chewing tobacco, and e-cigarettes.  Have a history of HPV (human papillomavirus).  Drink alcohol excessively.  Have combined lifestyle habits of alcohol and tobacco use.  Eat a diet that is low in fruits and vegetables.  Are African American.  Are female.  Are older than age 37.  Have been exposed to certain chemicals such as nickel, asbestos, or sulfuric acid fumes. What are the signs or symptoms? Symptoms may include:  Sore throat.  A lump, growth, or sore in the throat that does not get better.  Trouble swallowing.  Ear pain.  Cough.  Weight loss without trying (unintentional weight loss).  Hoarse voice that does not go away. How is this diagnosed? This condition may be diagnosed based on:  Your symptoms and medical history.  A physical exam of the throat. A tube with a light and a camera on the end of it (endoscope or laryngoscope) may be used to examine your throat.  Imaging tests, such as: ? CT scan of the throat. ? PET scan. ? MRI. ? X-rays of the chest and mouth.  Removal of a sample of tumor tissue to be tested (biopsy).  Tests to determine how well you are able to swallow, such as: ? Barium swallow. This is a procedure in which you swallow a solution (barium) before X-rays are done to evaluate your throat and other structures. The barium shows up well on X-rays, making it easier for your health care provider to see possible problems. ? Laryngeal videostroboscopy. During this exam, your health care provider uses an endoscope to take a video of  your vocal cords in motion. ? Fiberoptic endoscopic examination (FEES): During this procedure, a small, flexible endoscope is inserted through your nose to examine your ability to swallow. Your cancer will be assessed (staged) to determine how severe it is and how much it has spread (metastasized). How is this treated? Treatment for throat cancer depends on the type and stage of the cancer. Treatment may include one or more of the following:  Surgery to remove as much of the cancer as possible. This surgery may also involve removing lymph nodes in the area to be checked for cancer cells.  Medicines that kill cancer cells (chemotherapy).  High-energy rays that kill cancer cells (radiation therapy).  Targeted therapy. This targets specific parts of cancer cells and the area around them to block the growth and the spread of the cancer. Targeted therapy can help to limit the damage to healthy cells.  Medicines that help your body's disease-fighting system (immune system) fight cancer cells (immunotherapy). Follow these instructions at home: Lifestyle  Do not drink alcohol.  Do not use any products that contain nicotine or tobacco, such as cigarettes and e-cigarettes. If you need help quitting, ask your health care provider. Eating and drinking  Some of your treatments might affect your appetite and your ability to chew and swallow. If you are having problems eating, or if you do not have an appetite, meet with a diet and nutrition specialist (dietitian).  If you have side effects that affect eating, it may help to: ?  Eat smaller meals and snacks often. ? Drink high-nutrition and high-calorie shakes or supplements. ? Eat bland and soft foods that are easy to eat. ? Not eat foods that are hot, spicy, or hard to swallow. General instructions  Take over-the-counter and prescription medicines only as told by your health care provider.  Consider joining a support group for people who have  been diagnosed with throat cancer.  Work with your health care provider to manage any side effects of treatment.  Keep all follow-up visits as told by your health care provider. This is important. Where to find more information  American Cancer Society: www.cancer.[place] ([place]): www.cancer.gov Contact a health care provider if:  You have a fever.  You continue to lose weight without trying.  You have more problems chewing or swallowing.  You have new fatigue or weakness.  You have new symptoms, or your symptoms get worse. Get help right away if:  You cough up blood.  You have trouble breathing.  You faint.  You have pain that suddenly gets worse. Summary  Throat cancer (oropharyngeal cancer) is an abnormal growth of cancerous cells (malignant tumors) in the throat.  Tumors may affect the tonsils, the back of the throat (pharynx), or the voice box (larynx).  Your cancer will be assessed (staged) to determine how severe it is and how much it has spread (metastasized).  Work with your health care provider to manage any side effects of treatment. This information is not intended to replace advice given to you by your health care provider. Make sure you discuss any questions you have with your health care provider. Document Released: 05/20/2005 Document Revised: 09/28/2018 Document Reviewed: 03/21/2017 Elsevier Patient Education  2020 [employer].

## 2019-04-10 ENCOUNTER — Ambulatory Visit (HOSPITAL_COMMUNITY)
Admission: RE | Admit: 2019-04-10 | Discharge: 2019-04-10 | Disposition: A | Discharge status: Home / Self Care | Payer: Medicare Other | Source: Ambulatory Visit | Attending: Hematology | Admitting: Hematology

## 2019-04-10 ENCOUNTER — Ambulatory Visit (HOSPITAL_COMMUNITY)
Admission: RE | Admit: 2019-04-10 | Discharge: 2019-04-10 | Disposition: A | Discharge status: Home / Self Care | Payer: Medicare Other | Source: Ambulatory Visit | Attending: Otolaryngology | Admitting: Otolaryngology

## 2019-04-10 ENCOUNTER — Other Ambulatory Visit: Payer: Self-pay

## 2019-04-10 DIAGNOSIS — I2510 Atherosclerotic heart disease of native coronary artery without angina pectoris: Secondary | ICD-10-CM | POA: Diagnosis not present

## 2019-04-10 DIAGNOSIS — K5730 Diverticulosis of large intestine without perforation or abscess without bleeding: Secondary | ICD-10-CM | POA: Diagnosis not present

## 2019-04-10 DIAGNOSIS — C320 Malignant neoplasm of glottis: Secondary | ICD-10-CM

## 2019-04-10 DIAGNOSIS — I700 Atherosclerosis of aorta: Secondary | ICD-10-CM | POA: Diagnosis not present

## 2019-04-10 DIAGNOSIS — J439 Emphysema, unspecified: Secondary | ICD-10-CM | POA: Insufficient documentation

## 2019-04-10 DIAGNOSIS — C329 Malignant neoplasm of larynx, unspecified: Secondary | ICD-10-CM | POA: Diagnosis not present

## 2019-04-10 LAB — GLUCOSE, CAPILLARY: Glucose-Capillary: 92 mg/dL (ref 70–99)

## 2019-04-10 MED ORDER — FLUDEOXYGLUCOSE F - 18 (FDG) INJECTION
5.7000 | Freq: Once | INTRAVENOUS | Status: AC | PRN
  Administered 2019-04-10: 14:00:00 5.7 via INTRAVENOUS

## 2019-04-10 MED ORDER — IOHEXOL 300 MG/ML  SOLN
75.0000 mL | Freq: Once | INTRAMUSCULAR | Status: AC | PRN
  Administered 2019-04-10: 15:00:00 75 mL via INTRAVENOUS

## 2019-04-10 MED ORDER — SODIUM CHLORIDE (PF) 0.9 % IJ SOLN
INTRAMUSCULAR | Status: AC
  Filled 2019-04-10: qty 50

## 2019-04-12 NOTE — Progress Notes (Signed)
Head and Neck Cancer Location of Tumor / Histology:  03/26/19 DIAGNOSIS: A. VOCAL CORD MASS, LEFT, EXCISION: - Squamous cell carcinoma.  Patient presented with symptoms of: Hoarseness since the beginning of this year.   Biopsies of left vocal cord mass revealed: squamous cell carcinoma.  Nutrition Status Yes No Comments  Weight changes? []  [x]    Swallowing concerns? []  [x]    PEG? []  [x]     Referrals Yes No Comments  Social Work? []  [x]    Dentistry? []  [x]    Swallowing therapy? []  [x]    Nutrition? []  [x]    Med/Onc? [x]  []  Dr. Maylon Peppers 04/09/19   Safety Issues Yes No Comments  Prior radiation? []  [x]    Pacemaker/ICD? []  [x]    Possible current pregnancy? []  [x]    Is the patient on methotrexate? [x]  []  Methotrexate 50 mg injection weekly   Tobacco/Marijuana/Snuff/ETOH use: She is a former smoker, quitting in 2005.   Past/Anticipated interventions by otolaryngology, if any:  03/26/19 Dr. Benjamine Mola PROCEDURE PERFORMED:  Microdirect laryngoscopy with excision of left vocal cord mass  Past/Anticipated interventions by medical oncology, if any:  04/18/19 Dr. 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  CT neck did not demonstrate any cervical adenopathy. -I reviewed imaging results in detail with the patient -The case was also discussed extensively at the head and neck tumor board -In reviewing the CT neck images, there was evidence of paraglottic fat invasion, consistent with T3 disease -I reviewed the NCCN, in detail with the patient -Without upfront surgical resection (likely laryngectomy), the standard-of-care approach is definitive concurrent chemoradiation -We discussed some of the risks, benefits, side-effects of cisplatin. The  intent is for cure. -The plan is for cisplatin 40mg /m2 weekly x 7 weeks. -Some of the short term side-effects included, though not limited to, including weight loss, life threatening infections, risk of allergic reactions, need for transfusions of blood products, nausea, vomiting, change in bowel habits, loss of hair, admission to hospital for various reasons, and risks of death.  -Long term side-effects are also discussed including risks of infertility, permanent damage to nerve function, hearing loss, chronic fatigue, kidney damage with possibility needing hemodialysis, and rare secondary malignancy including bone marrow disorders. -The patient is aware that the response rates discussed earlier is not guaranteed.  After a long discussion, patient made an informed decision to proceed with the prescribed plan of care. -In anticipation of chemotherapy and radiation, I have ordered port and feeding tube placement -In addition, I have prescribed PRN antiemetics, including Zofran, Compazine, Ativan, and the dexamethasone -She will receive chemotherapy education prior to starting treatment -We will tentatively schedule 1st dose of chemotherapy for 05/03/2019, pending radiation oncology treatment plan    Current Complaints / other details:  04/10/19 PET scan/ CT neck

## 2019-04-18 ENCOUNTER — Telehealth: Payer: Self-pay | Admitting: *Deleted

## 2019-04-18 ENCOUNTER — Encounter: Payer: Self-pay | Admitting: Radiation Oncology

## 2019-04-18 ENCOUNTER — Other Ambulatory Visit: Payer: Self-pay

## 2019-04-18 ENCOUNTER — Telehealth: Payer: Self-pay | Admitting: Hematology

## 2019-04-18 ENCOUNTER — Other Ambulatory Visit: Payer: Medicare Other

## 2019-04-18 ENCOUNTER — Inpatient Hospital Stay: Payer: Medicare Other | Admitting: Hematology

## 2019-04-18 ENCOUNTER — Inpatient Hospital Stay: Payer: Medicare Other

## 2019-04-18 ENCOUNTER — Encounter: Payer: Self-pay | Admitting: *Deleted

## 2019-04-18 ENCOUNTER — Ambulatory Visit
Admission: RE | Admit: 2019-04-18 | Discharge: 2019-04-18 | Disposition: A | Discharge status: Home / Self Care | Payer: Medicare Other | Source: Ambulatory Visit | Attending: Radiation Oncology | Admitting: Radiation Oncology

## 2019-04-18 ENCOUNTER — Encounter: Payer: Self-pay | Admitting: Hematology

## 2019-04-18 VITALS — BP 137/66 | HR 91 | Temp 98.5°F | Resp 18 | Ht 63.0 in | Wt 117.1 lb

## 2019-04-18 DIAGNOSIS — C320 Malignant neoplasm of glottis: Secondary | ICD-10-CM

## 2019-04-18 DIAGNOSIS — Z87891 Personal history of nicotine dependence: Secondary | ICD-10-CM | POA: Diagnosis not present

## 2019-04-18 DIAGNOSIS — D539 Nutritional anemia, unspecified: Secondary | ICD-10-CM

## 2019-04-18 DIAGNOSIS — D75839 Thrombocytosis, unspecified: Secondary | ICD-10-CM

## 2019-04-18 DIAGNOSIS — M069 Rheumatoid arthritis, unspecified: Secondary | ICD-10-CM | POA: Diagnosis not present

## 2019-04-18 DIAGNOSIS — D473 Essential (hemorrhagic) thrombocythemia: Secondary | ICD-10-CM | POA: Diagnosis not present

## 2019-04-18 HISTORY — DX: Rheumatoid arthritis, unspecified: M06.9

## 2019-04-18 LAB — CBC WITH DIFFERENTIAL (CANCER CENTER ONLY)
Abs Immature Granulocytes: 0.01 10*3/uL (ref 0.00–0.07)
Basophils Absolute: 0 10*3/uL (ref 0.0–0.1)
Basophils Relative: 0 %
Eosinophils Absolute: 0.1 10*3/uL (ref 0.0–0.5)
Eosinophils Relative: 1 %
HCT: 35.9 % — ABNORMAL LOW (ref 36.0–46.0)
Hemoglobin: 11.7 g/dL — ABNORMAL LOW (ref 12.0–15.0)
Immature Granulocytes: 0 %
Lymphocytes Relative: 18 %
Lymphs Abs: 1.2 10*3/uL (ref 0.7–4.0)
MCH: 32.1 pg (ref 26.0–34.0)
MCHC: 32.6 g/dL (ref 30.0–36.0)
MCV: 98.6 fL (ref 80.0–100.0)
Monocytes Absolute: 0.5 10*3/uL (ref 0.1–1.0)
Monocytes Relative: 8 %
Neutro Abs: 4.8 10*3/uL (ref 1.7–7.7)
Neutrophils Relative %: 73 %
Platelet Count: 442 10*3/uL — ABNORMAL HIGH (ref 150–400)
RBC: 3.64 MIL/uL — ABNORMAL LOW (ref 3.87–5.11)
RDW: 13.5 % (ref 11.5–15.5)
WBC Count: 6.6 10*3/uL (ref 4.0–10.5)
nRBC: 0 % (ref 0.0–0.2)

## 2019-04-18 LAB — CMP (CANCER CENTER ONLY)
ALT: 22 U/L (ref 0–44)
AST: 19 U/L (ref 15–41)
Albumin: 3.8 g/dL (ref 3.5–5.0)
Alkaline Phosphatase: 106 U/L (ref 38–126)
Anion gap: 12 (ref 5–15)
BUN: 10 mg/dL (ref 8–23)
CO2: 24 mmol/L (ref 22–32)
Calcium: 9.5 mg/dL (ref 8.9–10.3)
Chloride: 106 mmol/L (ref 98–111)
Creatinine: 0.73 mg/dL (ref 0.44–1.00)
GFR, Est AFR Am: >60 mL/min (ref >60)
GFR, Estimated: >60 mL/min (ref >60)
Glucose, Bld: 66 mg/dL — ABNORMAL LOW (ref 70–99)
Potassium: 4 mmol/L (ref 3.5–5.1)
Sodium: 142 mmol/L (ref 135–145)
Total Bilirubin: 0.2 mg/dL — ABNORMAL LOW (ref 0.3–1.2)
Total Protein: 7.2 g/dL (ref 6.5–8.1)

## 2019-04-18 LAB — IRON AND TIBC
Iron: 62 ug/dL (ref 41–142)
Saturation Ratios: 21 % (ref 21–57)
TIBC: 298 ug/dL (ref 236–444)
UIBC: 235 ug/dL (ref 120–384)

## 2019-04-18 LAB — VITAMIN B12: Vitamin B-12: 146 pg/mL — ABNORMAL LOW (ref 180–914)

## 2019-04-18 LAB — FERRITIN: Ferritin: 68 ng/mL (ref 11–307)

## 2019-04-18 LAB — FOLATE: Folate: 17.4 ng/mL (ref >5.9)

## 2019-04-18 MED ORDER — DEXAMETHASONE 4 MG PO TABS: ORAL_TABLET | ORAL | 1 refills | Status: DC

## 2019-04-18 MED ORDER — ONDANSETRON HCL 8 MG PO TABS: 8.0000 mg | ORAL_TABLET | Freq: Two times a day (BID) | ORAL | 1 refills | Status: DC | PRN

## 2019-04-18 MED ORDER — PROCHLORPERAZINE MALEATE 10 MG PO TABS: 10.0000 mg | ORAL_TABLET | Freq: Four times a day (QID) | ORAL | 1 refills | Status: DC | PRN

## 2019-04-18 MED ORDER — LIDOCAINE-PRILOCAINE 2.5-2.5 % EX CREA: TOPICAL_CREAM | CUTANEOUS | 3 refills | Status: DC

## 2019-04-18 MED ORDER — LORAZEPAM 0.5 MG PO TABS: 0.5000 mg | ORAL_TABLET | Freq: Four times a day (QID) | ORAL | 0 refills | Status: DC | PRN

## 2019-04-18 NOTE — Telephone Encounter (Signed)
-----   Message from Tish Men, MD sent at 04/18/2019 10:53 AM EDT ----- Hi Carleena Mires,  Can you let Ms. Purington know that her B12 level was very low, and she should take over-the-counter B12 supplement, 1063mcg daily?Thanks!  [place]  ----- Message ----- From: Buel Ream, Lab In [place] Sent: 04/18/2019   8:36 AM EDT To: Tish Men, MD

## 2019-04-18 NOTE — Progress Notes (Signed)
START ON PATHWAY REGIMEN - Head and Neck     A cycle is every 7 days:     Cisplatin   **Always confirm dose/schedule in your pharmacy ordering system**  Patient Characteristics: Larynx, Stage III, IVA, IVB; Unresectable Disease Classification: Larynx Current Disease Status: No Distant Metastases and No Recurrent Disease AJCC T Category: T3 AJCC 8 Stage Grouping: III AJCC N Category: cN0 AJCC M Category: M0 Intent of Therapy: Curative Intent, Discussed with Patient 

## 2019-04-18 NOTE — Progress Notes (Addendum)
Radiation Oncology         [phone] ________________________________  Initial outpatient Consultation by MyChart Video  Name: Jean [name] MRN: [id number]  Date: 04/18/2019  DOB: Nov 28, 1945  CC:[name], MD  Newman Pies, MD   REFERRING PHYSICIAN: Newman Pies, MD  DIAGNOSIS:    ICD-10-CM   1. 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)  C32.0    Cancer Staging Squamous cell carcinoma of left vocal cord [facility]) Staging form: Larynx - Glottis, AJCC 8th Edition - Clinical stage from 04/11/2019: Stage III (cT3, cN0, cM0) - Signed by Lonie Peak, MD on 04/11/2019   CHIEF COMPLAINT: Here to discuss management of vocal cord cancer  HISTORY OF PRESENT ILLNESS::Jean [name] is a 73 y.o. female who presented with hoarse voice since around 06/2018. She delayed any evaluation due to caring for her husband, who had been placed on hospice at that time. He recently passed away.  Subsequently, the patient saw Dr. Suszanne Conners who performed laryngoscopy. This showed a large polypoid tissue covering the left vocal cord, possibly invading the vocalis muscle.  Biopsy of the left vocal cord mass on 03/26/2019 revealed: squamous cell carcinoma.  She was referred to Dr. Dion Body on 04/09/2019.  Pertinent imaging thus far includes PET scan performed on 04/10/2019 revealing left glottic lesion with hypermetabolism; no discrete adenopathy in the neck; changes related to patient's rheumatoid arthritis.     She also underwent neck CT on 04/10/2019, which showed: left glottic mass extending to but not beyond the anterior commissure; extension into the left paraglottic fat without cartilage erosion; no adenopathy. Based on tumor board review of imaging and ENT's impressions at laryngoscopy, this is considered T3.  Swallowing issues, if any: no  Weight Changes: mild weight loss (~7 lbs since 05/2018)  Pain status: no throat pain  Other symptoms: no SOB  Tobacco history, if any: yes, former  smoker, quit 10/2003  ETOH abuse, if any: no; occasional- 2 or so drinks monthly  Prior cancers, if any: no  She wants to preserve her larynx, avoid surgery.   PREVIOUS RADIATION THERAPY: No  PAST MEDICAL HISTORY:  has a past medical history of Arthritis, Hypertension, Rheumatoid arthritis (HCC), and Vocal cord mass.    PAST SURGICAL HISTORY: Past Surgical History:  Procedure Laterality Date   ECTOPIC PREGNANCY SURGERY     EXTERNAL EAR SURGERY Right    removal of cyst or gland   MICROLARYNGOSCOPY Left 03/26/2019   Procedure: DIRECT MICROLARYNGOSCOPY WITH BIOPSY OF VOCAL CORD MASS;  Surgeon: Newman Pies, MD;  Location: [facility];  Service: ENT;  Laterality: Left;    FAMILY HISTORY: family history includes Hypertension in her daughter.  SOCIAL HISTORY:  reports that she quit smoking about 15 years ago. She has never used smokeless tobacco. She reports previous alcohol use. She reports that she does not use drugs.  ALLERGIES: Sulfa antibiotics  MEDICATIONS:  Current Outpatient Medications  Medication Sig Dispense Refill   Adalimumab (HUMIRA PEN) 40 MG/0.4ML PNKT Inject 40 mg into the skin every 14 (fourteen) days. (Patient taking differently: Inject 40 mg into the skin every 21 ( twenty-one) days. ) 3 each 0   ALLERGIST TRAY 1CC 27GX1/2" 27G X 1/2" 1 ML KIT TO BE USED WITH WEEKLY METHOTREXATE INJECTIONS ONCE A WEEK 25 each 0   amLODipine (NORVASC) 5 MG tablet Take 5 mg by mouth daily.  0   Cholecalciferol (VITAMIN D) 50 MCG (2000 UT) CAPS Take by mouth daily.  folic acid (FOLVITE) 1 MG tablet TAKE 2 TABLETS(2 MG) BY MOUTH DAILY 180 tablet 3   losartan (COZAAR) 100 MG tablet Take 100 mg by mouth daily.   0   methotrexate 50 MG/2ML injection INJECT 0.6 ML(15 MG TOTAL) UNDER THE SKIN ONCE A WEEK 8 mL 0   ondansetron (ZOFRAN) 4 MG tablet Take 1 tablet (4 mg total) by mouth every 6 (six) hours as needed for nausea or vomiting. 30 tablet 2   dexamethasone  (DECADRON) 4 MG tablet Take 2 tablets by mouth once a day on the day after chemotherapy and then take 2 tablets two times a day for 2 days. Take with food. (Patient not taking: Reported on 04/18/2019) 30 tablet 1   lidocaine-prilocaine (EMLA) cream Apply to affected area once (Patient not taking: Reported on 04/18/2019) 30 g 3   LORazepam (ATIVAN) 0.5 MG tablet Take 1 tablet (0.5 mg total) by mouth every 6 (six) hours as needed (Nausea or vomiting). (Patient not taking: Reported on 04/18/2019) 30 tablet 0   ondansetron (ZOFRAN) 8 MG tablet Take 1 tablet (8 mg total) by mouth 2 (two) times daily as needed. Start on the third day after chemotherapy. (Patient not taking: Reported on 04/18/2019) 30 tablet 1   prochlorperazine (COMPAZINE) 10 MG tablet Take 1 tablet (10 mg total) by mouth every 6 (six) hours as needed (Nausea or vomiting). (Patient not taking: Reported on 04/18/2019) 30 tablet 1   No current facility-administered medications for this encounter.     REVIEW OF SYSTEMS:  Notable for that above.   PHYSICAL EXAM:  vitals were not taken for this visit.   General: Alert and oriented, in no acute distress HEENT: Head is normocephalic.Hoarse. Psychiatric: Judgment and insight are intact. Affect is appropriate.  LABORATORY DATA:  Lab Results  Component Value Date   WBC 6.6 04/18/2019   HGB 11.7 (L) 04/18/2019   HCT 35.9 (L) 04/18/2019   MCV 98.6 04/18/2019   PLT 442 (H) 04/18/2019   CMP     Component Value Date/Time   NA 142 04/18/2019 0822   NA 141 03/05/2014 1050   K 4.0 04/18/2019 0822   K 3.9 03/05/2014 1050   CL 106 04/18/2019 0822   CO2 24 04/18/2019 0822   CO2 24 03/05/2014 1050   GLUCOSE 66 (L) 04/18/2019 0822   GLUCOSE 99 03/05/2014 1050   BUN 10 04/18/2019 0822   BUN 10.6 03/05/2014 1050   CREATININE 0.73 04/18/2019 0822   CREATININE 0.77 01/15/2019 0907   CREATININE 0.8 03/05/2014 1050   CALCIUM 9.5 04/18/2019 0822   CALCIUM 8.8 03/05/2014 1050   PROT 7.2  04/18/2019 0822   PROT 6.5 03/05/2014 1050   ALBUMIN 3.8 04/18/2019 0822   ALBUMIN 3.2 (L) 03/05/2014 1050   AST 19 04/18/2019 0822   AST 12 03/05/2014 1050   ALT 22 04/18/2019 0822   ALT 28 03/05/2014 1050   ALKPHOS 106 04/18/2019 0822   ALKPHOS 85 03/05/2014 1050   BILITOT 0.2 (L) 04/18/2019 0822   BILITOT 0.20 03/05/2014 1050   GFRNONAA >60 04/18/2019 0822   GFRNONAA 78 01/15/2019 0907   GFRAA >60 04/18/2019 0822   GFRAA 91 01/15/2019 0907      Lab Results  Component Value Date   TSH 1.23 09/06/2016     RADIOGRAPHY: Ct Soft Tissue Neck W Contrast  Result Date: 04/11/2019 CLINICAL DATA:  Initial staging of glottic cancer EXAM: CT NECK WITH CONTRAST TECHNIQUE: Multidetector CT imaging of the neck was performed  using the standard protocol following the bolus administration of intravenous contrast. CONTRAST:  75mL OMNIPAQUE IOHEXOL 300 MG/ML  SOLN COMPARISON:  None. FINDINGS: Pharynx and larynx: Avidly enhancing mass centered at the left glottis which measures at least 15 mm and extends to but not clearly beyond the anterior commissure. There is growth into the left paraglottic fat without cartilage erosion. No sub site extension. Salivary glands: No inflammation, mass, or stone. Thyroid: Normal. Lymph nodes: None enlarged or abnormal density. Vascular: Atherosclerotic calcification without evident obstructive process. Limited intracranial: Negative Visualized orbits: Negative Mastoids and visualized paranasal sinuses: Retention cyst in the left maxillary sinus Skeleton: Upper and lower cervical facet spurring and midcervical spondylosis with ridging. Upper chest: Centrilobular emphysema. IMPRESSION: 1. Left glottic mass extending to but not beyond the anterior commissure. There is extension into the left paraglottic fat without cartilage erosion. 2. No adenopathy. 3. Aortic Atherosclerosis (ICD10-I70.0) and Emphysema (ICD10-J43.9). Electronically Signed   By: Marnee Spring M.D.   On:  04/11/2019 06:08   Nm Pet Image Initial (pi) Skull Base To Thigh  Result Date: 04/10/2019 CLINICAL DATA:  Initial treatment strategy for squamous cell carcinoma of the vocal cord. History of rheumatoid arthritis and osteoarthritis. EXAM: NUCLEAR MEDICINE PET SKULL BASE TO THIGH TECHNIQUE: 5.7 mCi F-18 FDG was injected intravenously. Full-ring PET imaging was performed from the skull base to thigh after the radiotracer. CT data was obtained and used for attenuation correction and anatomic localization. Fasting blood glucose: 92 mg/dl COMPARISON:  None FINDINGS: Mediastinal blood pool activity: SUV max 2.2 Liver activity: SUV max NA NECK: Left eccentric glottic mass, maximum SUV 8.9. Incidental CT findings: Mucous retention cyst in left maxillary sinus. Atherosclerotic calcification in the left common carotid artery. No hypermetabolic adenopathy in the neck is identified. CHEST: Diffuse subtle accentuated esophageal activity, maximum SUV 3.8 in the distal esophagus. Given the diffuse nature of this is probably physiologic. 1.0 cm AP window lymph node, maximum SUV 2.7. Two small left axillary lymph nodes are both hypermetabolic. The more anterior node on image 56/5 has a parenchymal thickness of 0.5 cm in short axis, maximum SUV 3.6. A lymph node with parenchymal thickness of 0.5 cm on the right has a maximum SUV of 3.2. Smaller subpectoral lymph nodes are identified. Incidental CT findings: Coronary, aortic arch, and branch vessel atherosclerotic vascular disease. Centrilobular emphysema. ABDOMEN/PELVIS: Diffuse gastric activity is likely physiologic, maximum SUV 4.5. Scattered physiologic activity in bowel. A left external iliac node measuring 0.7 cm in short axis on image 147/5 has maximum SUV of 4.4. Incidental CT findings: Hypodense hepatic lesions are observed, the larger posterior right hepatic lobe lesion visibly photopenic, favoring benign lesions. Mild right hydronephrosis without hydroureter, query  right UPJ narrowing. Prominent stool throughout the colon favors constipation. Aortoiliac atherosclerotic vascular disease. Sigmoid colon diverticulosis. Prominence of the left adnexa versus left eccentric uterus, but without hypermetabolic activity. SKELETON: Extensive hypermetabolic activity along many of the visualized large joints including the shoulders, hips, elbows, wrists, and along the left sternoclavicular joint. Aside from the sternoclavicular involvement this is mostly symmetric and probably from active Incidental CT findings: none IMPRESSION: 1. Left glottic lesion is hypermetabolic with maximum SUV 8.9, compatible with malignancy. No discrete adenopathy in the neck. 2. Extensive periarticular activity especially around the large joints, likely related to rheumatoid arthritis. 3. Small but hypermetabolic axillary and left external iliac lymph nodes are most likely reactive to the patient's rheumatoid arthritis, but merit surveillance. 4. Mild right hydronephrosis without hydroureter, query right UPJ narrowing.  Consider follow renal sonography. 5. Prominent but not hypermetabolic left adnexa, possibly due to prominent ovary or left eccentricity of the uterus, correlate with surgical history. This could be further investigated with pelvic sonography if clinically warranted. 6. Other imaging findings of potential clinical significance: Chronic left maxillary sinusitis. Aortic Atherosclerosis (ICD10-I70.0). Coronary atherosclerosis. Emphysema (ICD10-J43.9). Sigmoid colon diverticulosis. Electronically Signed   By: Gaylyn Rong M.D.   On: 04/10/2019 15:53      IMPRESSION/PLAN:  This is a delightful patient with head and neck cancer. I do recommend radiotherapy for this patient. She will get concurrent chemotherapy. She does not want surgery.  We discussed the potential risks, benefits, and side effects of radiotherapy. We talked in detail about acute and late effects. We discussed that some of  the most bothersome acute effects may be mucositis, dysgeusia, salivary changes, skin irritation, hair loss, dehydration, weight loss and fatigue. We talked about late effects which include but are not necessarily limited to dysphagia, hypothyroidism, nerve injury, spinal cord injury, xerostomia, and neck edema. No guarantees of treatment were given.   The patient is enthusiastic about proceeding with treatment. I look forward to participating in the patient's care.    Simulation (treatment planning) will take place in the next week  We also discussed that the treatment of head and neck cancer is a multidisciplinary process to maximize treatment outcomes and quality of life. For this reasons the following referrals have been or will be made:   Medical oncology to discuss chemotherapy    Dentistry for dental evaluation, possible extractions in the radiation fields, and /or advice on reducing risk of cavities, osteoradionecrosis, or other oral issues.   Nutritionist for nutrition support during and after treatment.   Speech language pathology for swallowing and/or speech therapy.   Social work for social support.    Physical therapy due to risk of lymphedema in neck and deconditioning.   Baseline labs including TSH.  Patient takes Methotrexate. Instructed to hold this during RT to reduce side effects.  Discuss alternatives with rheumatology.  This encounter was provided by telemedicine platform MYCHART VIDEO. The patient has given verbal consent for this type of encounter and has been advised to only accept a meeting of this type in a secure network environment. The time spent during this encounter was 25 minutes. The attendants for this meeting include Lonie Peak  and Tanya Nones.  During the encounter, Lonie Peak was located at [facility] Radiation Oncology Department.  Tanya Nones was located at home.    __________________________________________   Lonie Peak, MD   This document serves as a record of services personally performed by Lonie Peak, MD. It was created on her behalf by Mickie Bail, a trained medical scribe. The creation of this record is based on the scribe's personal observations and the provider's statements to them. This document has been checked and approved by the attending provider.

## 2019-04-18 NOTE — Progress Notes (Signed)
Dental Form with Estimates of Radiation Dose      Diagnosis: Glottic cancer, T3N0  Prognosis: good  Anticipated # of fractions: 35    Daily?: yes  # of weeks of radiotherapy: 7  Chemotherapy?: yes  Anticipated xerostomia:  Mild permanent   Pre-simulation needs:  Scatter protection if significant metal in mouth and if ready by sim - sim may be Nov 2nd.  Simulation: Cannot wait for dental extractions  Other Notes:  Please contact Eppie Gibson, MD, with patient's disposition after evaluation and/or dental treatment.

## 2019-04-18 NOTE — Telephone Encounter (Signed)
Spoke with pt and informed pt of low B12 level.  Instructed pt to take OTC B12 supplement 1000 mcg daily as per Dr. Lorette Ang instructions.  Pt voiced understanding.

## 2019-04-18 NOTE — Telephone Encounter (Signed)
Gave patient avs report and appointments for November and December  °

## 2019-04-18 NOTE — Telephone Encounter (Signed)
Spoke with pt and was informed that pt did eat breakfast after leaving office.  Stated she is feeling fine.  Pt stated she always carries candy with her, and knows when to eat.  No other complaints.

## 2019-04-18 NOTE — Telephone Encounter (Signed)
-----   Message from Tish Men, MD sent at 04/18/2019  9:11 AM EDT ----- Hi Thu,  Can you let ms. Pfarr know that her glucose was just borderline low, and she should drink some orange juice, chew on some candy, etc?  Thanks.  [place]  ----- Message ----- From: Buel Ream, Lab In [place] Sent: 04/18/2019   8:36 AM EDT To: Tish Men, MD

## 2019-04-18 NOTE — Progress Notes (Signed)
[place] OFFICE PROGRESS NOTE  Patient Care Team: Charolette Forward, MD as PCP - General (Cardiology) Wyatt Portela, MD as Consulting Physician (Oncology) Charolette Forward, MD as Referring Physician (Cardiology) Eppie Gibson, MD as Attending Physician (Radiation Oncology) Leota Sauers, RN as Oncology Nurse Navigator  HEME/ONC OVERVIEW: 1. 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  CT neck did not demonstrate any cervical adenopathy. -I reviewed imaging results in detail with the patient -The case was also discussed extensively at the head and neck tumor board -In reviewing the CT neck images, there was evidence of paraglottic fat invasion, consistent with T3 disease -I reviewed the NCCN, in detail with the patient -Without upfront surgical resection (likely laryngectomy), the standard-of-care approach is definitive concurrent chemoradiation -We discussed some of the risks, benefits, side-effects of cisplatin. The intent is for cure. -The plan is for cisplatin 42m/m2 weekly x 7 weeks. -Some of the short term side-effects included, though not limited to, including weight loss, life threatening infections, risk of allergic reactions, need for transfusions of blood products,  nausea, vomiting, change in bowel habits, loss of hair, admission to hospital for various reasons, and risks of death.  -Long term side-effects are also discussed including risks of infertility, permanent damage to nerve function, hearing loss, chronic fatigue, kidney damage with possibility needing hemodialysis, and rare secondary malignancy including bone marrow disorders. -The patient is aware that the response rates discussed earlier is not guaranteed.  After a long discussion, patient made an informed decision to proceed with the prescribed plan of care. -In anticipation of chemotherapy and radiation, I have ordered port and feeding tube placement -In addition, I have prescribed PRN antiemetics, including Zofran, Compazine, Ativan, and the dexamethasone -She will receive chemotherapy education prior to starting treatment -We will tentatively schedule 1st dose of chemotherapy for 05/03/2019, pending radiation oncology treatment plan   Macrocytic anemia -Likely due to methotrexate -Hgb 11.7, stable  -Clinically, patient denies any symptoms of bleeding -Iiron profile, B12 and folate pending -We will monitor it for now -She will need age-appropriate cancer screening, including colonoscopy and MMG, after completing chemoradiation   Thrombocytosis -Likely secondary to rheumatoid arthritis -Plts 442k today, mildly persistently elevated -We will monitor it for now   Orders Placed This Encounter  Procedures  . IR GASTROSTOMY TUBE MOD SED    Standing Status:   Future    Standing Expiration Date:   06/17/2020    Order Specific Question:   Reason for exam:    Answer:   chemoradiation, need feeding tube.    Order Specific Question:   Preferred Imaging Location?    Answer:   [facility]    Standing Status:   Future    Standing Expiration Date:   06/17/2020    Order Specific Question:  Reason for Exam (SYMPTOM  OR DIAGNOSIS REQUIRED)    Answer:    chemoradiation, need chemo access    Order Specific Question:   Preferred Imaging Location?    Answer:   [facility]  . CBC with Differential ([place] Only)    Standing Status:   Standing    Number of Occurrences:   20    Standing Expiration Date:   04/17/2020  . Basic Metabolic Panel - [place] Only    Standing Status:   Standing    Number of Occurrences:   20    Standing Expiration Date:   04/17/2020  . Magnesium    Standing Status:   Standing    Number of Occurrences:   20    Standing Expiration Date:   04/17/2020  . PHYSICIAN COMMUNICATION ORDER    A baseline Audiogram is recommended prior to initiation of cisplatin chemotherapy.    All questions were answered. The patient knows to call the clinic with any problems, questions or concerns. No barriers to learning was detected.  Return on 05/02/2019 for labs and clinic appointment prior to starting chemotherapy on 05/03/2019.  Tish Men, MD 04/18/2019 9:08 AM  CHIEF COMPLAINT: "I am doing okay"  INTERVAL HISTORY: Ms. Linzy returns to clinic for follow-up of squamous cell carcinoma of the left vocal cord.  Patient reports that since last visit, she has been able to gain approximately 2 pounds.  She still has hoarseness to the voice, mostly comes and goes, and she denies any dysphagia or odynophagia.  She otherwise feels well today, and the denies any complaint.  REVIEW OF SYSTEMS:   Constitutional: ( - ) fevers, ( - )  chills , ( - ) night sweats Eyes: ( - ) blurriness of vision, ( - ) double vision, ( - ) watery eyes Ears, nose, mouth, throat, and face: ( - ) mucositis, ( - ) sore throat Respiratory: ( - ) cough, ( - ) dyspnea, ( - ) wheezes Cardiovascular: ( - ) palpitation, ( - ) chest discomfort, ( - ) lower extremity swelling Gastrointestinal:  ( - ) nausea, ( - ) heartburn, ( - ) change in bowel habits Skin: ( - ) abnormal skin rashes Lymphatics: ( - ) new lymphadenopathy, ( - ) easy  bruising Neurological: ( - ) numbness, ( - ) tingling, ( - ) new weaknesses Behavioral/Psych: ( - ) mood change, ( - ) new changes  All other systems were reviewed with the patient and are negative.  SUMMARY OF ONCOLOGIC HISTORY: Oncology History  Squamous cell carcinoma of left vocal cord ([place])  03/26/2019 Pathology Results   DIAGNOSIS:   A. VOCAL CORD MASS, LEFT, EXCISION:  - Squamous cell carcinoma.  - See comment.    04/06/2019 Initial Diagnosis   Squamous cell carcinoma of left vocal cord ([place])   04/10/2019 Imaging   PET: IMPRESSION: 1. Left glottic lesion is hypermetabolic with maximum SUV 8.9, compatible with malignancy. No discrete adenopathy in the neck. 2. Extensive periarticular activity especially around the large joints, likely related to rheumatoid arthritis. 3. Small but hypermetabolic axillary and left external iliac lymph nodes are most likely reactive to the patient's rheumatoid arthritis, but merit surveillance. 4. Mild right hydronephrosis without hydroureter, query right UPJ narrowing. Consider follow renal sonography. 5. Prominent but not hypermetabolic left adnexa, possibly due to prominent ovary or left eccentricity of the uterus, correlate with surgical history. This could be further investigated with pelvic sonography if clinically warranted. 6.  Other imaging findings of potential clinical significance: Chronic left maxillary sinusitis. Aortic Atherosclerosis (ICD10-I70.0). Coronary atherosclerosis. Emphysema (ICD10-J43.9). Sigmoid colon diverticulosis.   04/10/2019 Imaging   CT neck w/ contrast: IMPRESSION: 1. Left glottic mass extending to but not beyond the anterior commissure. There is extension into the left paraglottic fat without cartilage erosion. 2. No adenopathy. 3. Aortic Atherosclerosis (ICD10-I70.0) and Emphysema (ICD10-J43.9).   04/11/2019 Cancer Staging   Staging form: Larynx - Glottis, AJCC 8th Edition - Clinical stage from  04/11/2019: Stage III (cT3, cN0, cM0) - Signed by Eppie Gibson, MD on 04/11/2019   05/03/2019 -  Chemotherapy   The patient had palonosetron (ALOXI) injection 0.25 mg, 0.25 mg, Intravenous,  Once, 0 of 7 cycles CISplatin (PLATINOL) 62 mg in sodium chloride 0.9 % 250 mL chemo infusion, 40 mg/m2 = 62 mg, Intravenous,  Once, 0 of 7 cycles fosaprepitant (EMEND) 150 mg, dexamethasone (DECADRON) 12 mg in sodium chloride 0.9 % 145 mL IVPB, , Intravenous,  Once, 0 of 7 cycles  for chemotherapy treatment.      I have reviewed the past medical history, past surgical history, social history and family history with the patient and they are unchanged from previous note.  ALLERGIES:  is allergic to sulfa antibiotics.  MEDICATIONS:  Current Outpatient Medications  Medication Sig Dispense Refill  . Adalimumab (HUMIRA PEN) 40 MG/0.4ML PNKT Inject 40 mg into the skin every 14 (fourteen) days. (Patient taking differently: Inject 40 mg into the skin every 21 ( twenty-one) days. ) 3 each 0  . ALLERGIST TRAY 1CC 27GX1/2" 27G X 1/2" 1 ML KIT TO BE USED WITH WEEKLY METHOTREXATE INJECTIONS ONCE A WEEK 25 each 0  . amLODipine (NORVASC) 5 MG tablet Take 5 mg by mouth daily.  0  . Cholecalciferol (VITAMIN D) 50 MCG (2000 UT) CAPS Take by mouth daily.    . folic acid (FOLVITE) 1 MG tablet TAKE 2 TABLETS(2 MG) BY MOUTH DAILY 180 tablet 3  . losartan (COZAAR) 100 MG tablet Take 100 mg by mouth daily.   0  . methotrexate 50 MG/2ML injection INJECT 0.6 ML(15 MG TOTAL) UNDER THE SKIN ONCE A WEEK 8 mL 0  . ondansetron (ZOFRAN) 4 MG tablet Take 1 tablet (4 mg total) by mouth every 6 (six) hours as needed for nausea or vomiting. 30 tablet 2  . dexamethasone (DECADRON) 4 MG tablet Take 2 tablets by mouth once a day on the day after chemotherapy and then take 2 tablets two times a day for 2 days. Take with food. 30 tablet 1  . lidocaine-prilocaine (EMLA) cream Apply to affected area once 30 g 3  . LORazepam (ATIVAN) 0.5 MG  tablet Take 1 tablet (0.5 mg total) by mouth every 6 (six) hours as needed (Nausea or vomiting). 30 tablet 0  . ondansetron (ZOFRAN) 8 MG tablet Take 1 tablet (8 mg total) by mouth 2 (two) times daily as needed. Start on the third day after chemotherapy. 30 tablet 1  . prochlorperazine (COMPAZINE) 10 MG tablet Take 1 tablet (10 mg total) by mouth every 6 (six) hours as needed (Nausea or vomiting). 30 tablet 1   No current facility-administered medications for this visit.     PHYSICAL EXAMINATION: ECOG PERFORMANCE STATUS: 1 - Symptomatic but completely ambulatory  Today's Vitals   04/18/19 0837 04/18/19 0840  BP: 137/66   Pulse: 91   Resp: 18   Temp: 98.5 F (36.9 C)   TempSrc: Oral   SpO2: 100%   Weight: 117  lb 1.6 oz (53.1 kg)   Height: 5' 3"  (1.6 m)   PainSc:  0-No pain   Body mass index is 20.74 kg/m.  Filed Weights   04/18/19 0837  Weight: 117 lb 1.6 oz (53.1 kg)    GENERAL: alert, no distress and comfortable SKIN: skin color, texture, turgor are normal, no rashes or significant lesions EYES: conjunctiva are pink and non-injected, sclera clear NECK: supple, non-tender LYMPH:  no palpable lymphadenopathy in the cervical LUNGS: clear to auscultation with normal breathing effort HEART: regular rate & rhythm and no murmurs and no lower extremity edema ABDOMEN: soft, non-tender, non-distended, normal bowel sounds Musculoskeletal: no cyanosis of digits and no clubbing  PSYCH: alert & oriented x 3, fluent speech NEURO: no focal motor/sensory deficits  LABORATORY DATA:  I have reviewed the data as listed    Component Value Date/Time   NA 142 04/18/2019 0822   NA 141 03/05/2014 1050   K 4.0 04/18/2019 0822   K 3.9 03/05/2014 1050   CL 106 04/18/2019 0822   CO2 24 04/18/2019 0822   CO2 24 03/05/2014 1050   GLUCOSE 66 (L) 04/18/2019 0822   GLUCOSE 99 03/05/2014 1050   BUN 10 04/18/2019 0822   BUN 10.6 03/05/2014 1050   CREATININE 0.73 04/18/2019 0822   CREATININE  0.77 01/15/2019 0907   CREATININE 0.8 03/05/2014 1050   CALCIUM 9.5 04/18/2019 0822   CALCIUM 8.8 03/05/2014 1050   PROT 7.2 04/18/2019 0822   PROT 6.5 03/05/2014 1050   ALBUMIN 3.8 04/18/2019 0822   ALBUMIN 3.2 (L) 03/05/2014 1050   AST 19 04/18/2019 0822   AST 12 03/05/2014 1050   ALT 22 04/18/2019 0822   ALT 28 03/05/2014 1050   ALKPHOS 106 04/18/2019 0822   ALKPHOS 85 03/05/2014 1050   BILITOT 0.2 (L) 04/18/2019 0822   BILITOT 0.20 03/05/2014 1050   GFRNONAA >60 04/18/2019 0822   GFRNONAA 78 01/15/2019 0907   GFRAA >60 04/18/2019 0822   GFRAA 91 01/15/2019 0907    No results found for: SPEP, UPEP  Lab Results  Component Value Date   WBC 6.6 04/18/2019   NEUTROABS 4.8 04/18/2019   HGB 11.7 (L) 04/18/2019   HCT 35.9 (L) 04/18/2019   MCV 98.6 04/18/2019   PLT 442 (H) 04/18/2019      Chemistry      Component Value Date/Time   NA 142 04/18/2019 0822   NA 141 03/05/2014 1050   K 4.0 04/18/2019 0822   K 3.9 03/05/2014 1050   CL 106 04/18/2019 0822   CO2 24 04/18/2019 0822   CO2 24 03/05/2014 1050   BUN 10 04/18/2019 0822   BUN 10.6 03/05/2014 1050   CREATININE 0.73 04/18/2019 0822   CREATININE 0.77 01/15/2019 0907   CREATININE 0.8 03/05/2014 1050      Component Value Date/Time   CALCIUM 9.5 04/18/2019 0822   CALCIUM 8.8 03/05/2014 1050   ALKPHOS 106 04/18/2019 0822   ALKPHOS 85 03/05/2014 1050   AST 19 04/18/2019 0822   AST 12 03/05/2014 1050   ALT 22 04/18/2019 0822   ALT 28 03/05/2014 1050   BILITOT 0.2 (L) 04/18/2019 0822   BILITOT 0.20 03/05/2014 1050       RADIOGRAPHIC STUDIES: I have personally reviewed the radiological images as listed below and agreed with the findings in the report. Ct Soft Tissue Neck W Contrast  Result Date: 04/11/2019 CLINICAL DATA:  Initial staging of glottic cancer EXAM: CT NECK WITH CONTRAST TECHNIQUE: Multidetector CT  imaging of the neck was performed using the standard protocol following the bolus administration of  intravenous contrast. CONTRAST:  48m OMNIPAQUE IOHEXOL 300 MG/ML  SOLN COMPARISON:  None. FINDINGS: Pharynx and larynx: Avidly enhancing mass centered at the left glottis which measures at least 15 mm and extends to but not clearly beyond the anterior commissure. There is growth into the left paraglottic fat without cartilage erosion. No sub site extension. Salivary glands: No inflammation, mass, or stone. Thyroid: Normal. Lymph nodes: None enlarged or abnormal density. Vascular: Atherosclerotic calcification without evident obstructive process. Limited intracranial: Negative Visualized orbits: Negative Mastoids and visualized paranasal sinuses: Retention cyst in the left maxillary sinus Skeleton: Upper and lower cervical facet spurring and midcervical spondylosis with ridging. Upper chest: Centrilobular emphysema. IMPRESSION: 1. Left glottic mass extending to but not beyond the anterior commissure. There is extension into the left paraglottic fat without cartilage erosion. 2. No adenopathy. 3. Aortic Atherosclerosis (ICD10-I70.0) and Emphysema (ICD10-J43.9). Electronically Signed   By: JMonte FantasiaM.D.   On: 04/11/2019 06:08   Nm Pet Image Initial (pi) Skull Base To Thigh  Result Date: 04/10/2019 CLINICAL DATA:  Initial treatment strategy for squamous cell carcinoma of the vocal cord. History of rheumatoid arthritis and osteoarthritis. EXAM: NUCLEAR MEDICINE PET SKULL BASE TO THIGH TECHNIQUE: 5.7 mCi F-18 FDG was injected intravenously. Full-ring PET imaging was performed from the skull base to thigh after the radiotracer. CT data was obtained and used for attenuation correction and anatomic localization. Fasting blood glucose: 92 mg/dl COMPARISON:  None FINDINGS: Mediastinal blood pool activity: SUV max 2.2 Liver activity: SUV max NA NECK: Left eccentric glottic mass, maximum SUV 8.9. Incidental CT findings: Mucous retention cyst in left maxillary sinus. Atherosclerotic calcification in the left common  carotid artery. No hypermetabolic adenopathy in the neck is identified. CHEST: Diffuse subtle accentuated esophageal activity, maximum SUV 3.8 in the distal esophagus. Given the diffuse nature of this is probably physiologic. 1.0 cm AP window lymph node, maximum SUV 2.7. Two small left axillary lymph nodes are both hypermetabolic. The more anterior node on image 56/5 has a parenchymal thickness of 0.5 cm in short axis, maximum SUV 3.6. A lymph node with parenchymal thickness of 0.5 cm on the right has a maximum SUV of 3.2. Smaller subpectoral lymph nodes are identified. Incidental CT findings: Coronary, aortic arch, and branch vessel atherosclerotic vascular disease. Centrilobular emphysema. ABDOMEN/PELVIS: Diffuse gastric activity is likely physiologic, maximum SUV 4.5. Scattered physiologic activity in bowel. A left external iliac node measuring 0.7 cm in short axis on image 147/5 has maximum SUV of 4.4. Incidental CT findings: Hypodense hepatic lesions are observed, the larger posterior right hepatic lobe lesion visibly photopenic, favoring benign lesions. Mild right hydronephrosis without hydroureter, query right UPJ narrowing. Prominent stool throughout the colon favors constipation. Aortoiliac atherosclerotic vascular disease. Sigmoid colon diverticulosis. Prominence of the left adnexa versus left eccentric uterus, but without hypermetabolic activity. SKELETON: Extensive hypermetabolic activity along many of the visualized large joints including the shoulders, hips, elbows, wrists, and along the left sternoclavicular joint. Aside from the sternoclavicular involvement this is mostly symmetric and probably from active Incidental CT findings: none IMPRESSION: 1. Left glottic lesion is hypermetabolic with maximum SUV 8.9, compatible with malignancy. No discrete adenopathy in the neck. 2. Extensive periarticular activity especially around the large joints, likely related to rheumatoid arthritis. 3. Small but  hypermetabolic axillary and left external iliac lymph nodes are most likely reactive to the patient's rheumatoid arthritis, but merit surveillance. 4. Mild right  hydronephrosis without hydroureter, query right UPJ narrowing. Consider follow renal sonography. 5. Prominent but not hypermetabolic left adnexa, possibly due to prominent ovary or left eccentricity of the uterus, correlate with surgical history. This could be further investigated with pelvic sonography if clinically warranted. 6. Other imaging findings of potential clinical significance: Chronic left maxillary sinusitis. Aortic Atherosclerosis (ICD10-I70.0). Coronary atherosclerosis. Emphysema (ICD10-J43.9). Sigmoid colon diverticulosis. Electronically Signed   By: Van Clines M.D.   On: 04/10/2019 15:53

## 2019-04-18 NOTE — Progress Notes (Signed)
Oncology Nurse Navigator Documentation  Joined Jean [name] during MyChart video consult with Dr. Isidore Moos.   Met with patient during initial consult with Dr. Isidore Moos.  He was accompanied by    . Further introduced myself as her Navigator, explained my role as a member of the Care Team.   . Provided introductory explanation of radiation treatment including SIM planning and purpose of Aquaplast head and shoulder mask, showed her example.  She denied claustrophobia. . In support of Dr. Lorette Ang discussion of port-a-cath and PEG, showed her examples, reinforced value of placement prior to starting chemoRT.  She voiced understanding I will provide PEG education prior to placement, will be available for additional PEG support throughout treatment. . She indicated dtr and son-in-law will be able to provide transportation when she begins tmt, I informed her of availability of transportation service provided by [facility].  . Explained she will meet with SLP and PT at the 11/19 [facility] per Dr. Pearlie Oyster referrals. . I encouraged them to contact me with questions/concerns as treatments/procedures begin.  She verbalized understanding of information provided, agreed to call me with questions/concerns as she proceeds appts/procedures.[name], RN, BSN Head & Neck Oncology Nurse [place] at [place] [phone]

## 2019-04-19 ENCOUNTER — Other Ambulatory Visit: Payer: Self-pay | Admitting: Radiation Oncology

## 2019-04-19 ENCOUNTER — Encounter: Payer: Self-pay | Admitting: Radiation Oncology

## 2019-04-19 ENCOUNTER — Telehealth: Payer: Self-pay | Admitting: *Deleted

## 2019-04-19 DIAGNOSIS — C320 Malignant neoplasm of glottis: Secondary | ICD-10-CM

## 2019-04-19 LAB — SOLUBLE TRANSFERRIN RECEPTOR: Transferrin Receptor: 20.8 nmol/L (ref 12.2–27.3)

## 2019-04-19 NOTE — Telephone Encounter (Addendum)
Oncology Nurse Navigator Documentation  Called Ms Kaufmann to confirm receipt of VMM from Dr. Isidore Moos re holding methotrexate during 7 wks RT and request to  consult with rheumatologist for guidance.  She stated she rec'd message and has called physician, LVMM.  She indicated she wd call me after she speaks with him.  Informed her of 11/5 PEG/port placement at [facility] with 10:00 arrival, NPO 6 hrs prior, need to have someone to take her home.  She agreed to meet with me after 11/2 8:30 Dental [facility] appt for PEG education after which she will have COVID test at [facility] and then return to [facility] for Chemo Ed.  She voiced understanding of plan.  [name], RN, BSN Head & Neck Oncology Nurse [place] at [place] [phone]

## 2019-04-23 ENCOUNTER — Ambulatory Visit
Admission: RE | Admit: 2019-04-23 | Discharge: 2019-04-23 | Disposition: A | Discharge status: Home / Self Care | Payer: Medicare Other | Source: Ambulatory Visit | Attending: Radiation Oncology | Admitting: Radiation Oncology

## 2019-04-23 ENCOUNTER — Ambulatory Visit (HOSPITAL_COMMUNITY): Payer: Self-pay | Admitting: Dentistry

## 2019-04-23 ENCOUNTER — Encounter (HOSPITAL_COMMUNITY): Payer: Self-pay | Admitting: Dentistry

## 2019-04-23 ENCOUNTER — Other Ambulatory Visit: Payer: Self-pay

## 2019-04-23 ENCOUNTER — Telehealth: Payer: Self-pay | Admitting: Pharmacist

## 2019-04-23 ENCOUNTER — Inpatient Hospital Stay: Payer: Medicare Other | Attending: Hematology

## 2019-04-23 ENCOUNTER — Encounter: Payer: Self-pay | Admitting: *Deleted

## 2019-04-23 ENCOUNTER — Other Ambulatory Visit (HOSPITAL_COMMUNITY)
Admission: RE | Admit: 2019-04-23 | Discharge: 2019-04-23 | Disposition: A | Discharge status: Home / Self Care | Payer: Medicare Other | Source: Ambulatory Visit | Attending: Hematology | Admitting: Hematology

## 2019-04-23 VITALS — BP 126/65 | HR 89 | Temp 98.5°F

## 2019-04-23 DIAGNOSIS — Z01812 Encounter for preprocedural laboratory examination: Secondary | ICD-10-CM | POA: Diagnosis not present

## 2019-04-23 DIAGNOSIS — Z510 Encounter for antineoplastic radiation therapy: Secondary | ICD-10-CM | POA: Insufficient documentation

## 2019-04-23 DIAGNOSIS — F40232 Fear of other medical care: Secondary | ICD-10-CM

## 2019-04-23 DIAGNOSIS — C320 Malignant neoplasm of glottis: Secondary | ICD-10-CM

## 2019-04-23 DIAGNOSIS — K0530 Chronic periodontitis, unspecified: Secondary | ICD-10-CM

## 2019-04-23 DIAGNOSIS — Z79899 Other long term (current) drug therapy: Secondary | ICD-10-CM | POA: Diagnosis not present

## 2019-04-23 DIAGNOSIS — M264 Malocclusion, unspecified: Secondary | ICD-10-CM

## 2019-04-23 DIAGNOSIS — Z20828 Contact with and (suspected) exposure to other viral communicable diseases: Secondary | ICD-10-CM | POA: Insufficient documentation

## 2019-04-23 DIAGNOSIS — M2630 Unspecified anomaly of tooth position of fully erupted tooth or teeth: Secondary | ICD-10-CM

## 2019-04-23 DIAGNOSIS — K0850 Unsatisfactory restoration of tooth, unspecified: Secondary | ICD-10-CM

## 2019-04-23 DIAGNOSIS — E876 Hypokalemia: Secondary | ICD-10-CM | POA: Insufficient documentation

## 2019-04-23 DIAGNOSIS — K011 Impacted teeth: Secondary | ICD-10-CM

## 2019-04-23 DIAGNOSIS — Z01818 Encounter for other preprocedural examination: Secondary | ICD-10-CM

## 2019-04-23 DIAGNOSIS — K036 Deposits [accretions] on teeth: Secondary | ICD-10-CM

## 2019-04-23 DIAGNOSIS — M2632 Excessive spacing of fully erupted teeth: Secondary | ICD-10-CM

## 2019-04-23 DIAGNOSIS — K06010 Localized gingival recession, unspecified: Secondary | ICD-10-CM

## 2019-04-23 DIAGNOSIS — K0889 Other specified disorders of teeth and supporting structures: Secondary | ICD-10-CM

## 2019-04-23 DIAGNOSIS — K029 Dental caries, unspecified: Secondary | ICD-10-CM

## 2019-04-23 DIAGNOSIS — K031 Abrasion of teeth: Secondary | ICD-10-CM

## 2019-04-23 DIAGNOSIS — Z5111 Encounter for antineoplastic chemotherapy: Secondary | ICD-10-CM | POA: Insufficient documentation

## 2019-04-23 DIAGNOSIS — K08409 Partial loss of teeth, unspecified cause, unspecified class: Secondary | ICD-10-CM

## 2019-04-23 MED ORDER — SODIUM FLUORIDE 1.1 % DT CREA: TOPICAL_CREAM | DENTAL | 99 refills | Status: DC

## 2019-04-23 NOTE — Progress Notes (Signed)
DENTAL CONSULTATION  Date of Consultation:  04/23/2019 Patient Name:   Jean [name] Date of Birth:   Jun 09, 1946 Medical Record Number: [id number]  COVID 19 SCREENING: The patient does not symptoms concerning for COVID-19 infection (Including fever, chills, cough, or new SHORTNESS OF BREATH).    VITALS: BP (!) 126/6 (BP Location: Right Arm)   Pulse 89   Temp 98.5 F (36.9 C)   CHIEF COMPLAINT: Patient referred by Dr. Basilio Cairo for a dental consultation.  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  Patient with anticipated chemoradiation therapy.  Patient is now seen as part of a medically necessary prechemoradiation therapy dental protocol examination.  The patient currently denies acute toothaches, swellings, or abscesses.  The patient was last seen on 01/26/2019 for an exam and cleaning at [facility].  The patient has been receiving treatment at [facility] for the past 3 to 5 years.  The patient has a treatment plan in place for periodontal maintenance procedure, multiple dental restorations, multiple crown and bridge restorations, and implant therapy for tooth #5 area.  The patient denies having any partial dentures.  Patient does have dental phobia by report.   PROBLEM LIST: Patient Active Problem List   Diagnosis Date Noted  . 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) 04/06/2019    Priority: High  . Vitamin D deficiency 01/23/2017  . Rheumatoid arthritis, seronegative, multiple sites (HCC) 01/19/2017  . High risk medication use 01/19/2017  . Primary osteoarthritis of both hands 01/19/2017  . Primary osteoarthritis of both knees 01/19/2017  . Fatigue 01/19/2017  . Thrombocytosis (HCC) 01/19/2017  . Primary osteoarthritis of both feet 01/19/2017  . Macrocytic anemia 01/19/2017  . Screening-pulmonary TB 04/26/2016    PMH: Past Medical History:  Diagnosis Date  . Arthritis   .  Hypertension   . Rheumatoid arthritis (HCC)   . Vocal cord mass     PSH: Past Surgical History:  Procedure Laterality Date  . ECTOPIC PREGNANCY SURGERY    . EXTERNAL EAR SURGERY Right    removal of cyst or gland  . MICROLARYNGOSCOPY Left 03/26/2019   Procedure: DIRECT MICROLARYNGOSCOPY WITH BIOPSY OF VOCAL CORD MASS;  Surgeon: Newman Pies, MD;  Location: [facility];  Service: ENT;  Laterality: Left;    ALLERGIES: Allergies  Allergen Reactions  . Sulfa Antibiotics     MEDICATIONS: Current Outpatient Medications  Medication Sig Dispense Refill  . Adalimumab (HUMIRA PEN) 40 MG/0.4ML PNKT Inject 40 mg into the skin every 14 (fourteen) days. (Patient taking differently: Inject 40 mg into the skin every 21 ( twenty-one) days. ) 3 each 0  . ALLERGIST TRAY 1CC 27GX1/2" 27G X 1/2" 1 ML KIT TO BE USED WITH WEEKLY METHOTREXATE INJECTIONS ONCE A WEEK 25 each 0  . amLODipine (NORVASC) 5 MG tablet Take 5 mg by mouth daily.  0  . Cholecalciferol (VITAMIN D) 50 MCG (2000 UT) CAPS Take 2,000 Units by mouth daily.     . folic acid (FOLVITE) 1 MG tablet TAKE 2 TABLETS(2 MG) BY MOUTH DAILY (Patient taking differently: Take 2 mg by mouth daily. ) 180 tablet 3  . losartan (COZAAR) 100 MG tablet Take 100 mg by mouth daily.   0  . methotrexate 50 MG/2ML injection INJECT 0.6 ML(15 MG TOTAL) UNDER THE SKIN ONCE A WEEK 8 mL 0  . dexamethasone (DECADRON) 4 MG tablet Take 2 tablets by mouth once a day  on the day after chemotherapy and then take 2 tablets two times a day for 2 days. Take with food. 30 tablet 1  . lidocaine-prilocaine (EMLA) cream Apply to affected area once 30 g 3  . LORazepam (ATIVAN) 0.5 MG tablet Take 1 tablet (0.5 mg total) by mouth every 6 (six) hours as needed (Nausea or vomiting). 30 tablet 0  . ondansetron (ZOFRAN) 4 MG tablet Take 1 tablet (4 mg total) by mouth every 6 (six) hours as needed for nausea or vomiting. 30 tablet 2  . ondansetron (ZOFRAN) 8 MG tablet Take 1 tablet  (8 mg total) by mouth 2 (two) times daily as needed. Start on the third day after chemotherapy. 30 tablet 1  . prochlorperazine (COMPAZINE) 10 MG tablet Take 1 tablet (10 mg total) by mouth every 6 (six) hours as needed (Nausea or vomiting). 30 tablet 1  . vitamin B-12 (CYANOCOBALAMIN) 1000 MCG tablet Take 1,000 mcg by mouth daily.     No current facility-administered medications for this visit.      LABS: Lab Results  Component Value Date   WBC 6.6 04/18/2019   HGB 11.7 (L) 04/18/2019   HCT 35.9 (L) 04/18/2019   MCV 98.6 04/18/2019   PLT 442 (H) 04/18/2019      Component Value Date/Time   NA 142 04/18/2019 0822   NA 141 03/05/2014 1050   K 4.0 04/18/2019 0822   K 3.9 03/05/2014 1050   CL 106 04/18/2019 0822   CO2 24 04/18/2019 0822   CO2 24 03/05/2014 1050   GLUCOSE 66 (L) 04/18/2019 0822   GLUCOSE 99 03/05/2014 1050   BUN 10 04/18/2019 0822   BUN 10.6 03/05/2014 1050   CREATININE 0.73 04/18/2019 0822   CREATININE 0.77 01/15/2019 0907   CREATININE 0.8 03/05/2014 1050   CALCIUM 9.5 04/18/2019 0822   CALCIUM 8.8 03/05/2014 1050   GFRNONAA >60 04/18/2019 0822   GFRNONAA 78 01/15/2019 0907   GFRAA >60 04/18/2019 0822   GFRAA 91 01/15/2019 0907   No results found for: INR, PROTIME No results found for: PTT  SOCIAL HISTORY: Social History   Social History Narrative   Patient was recently widowed in March 2020.   Patient has 1 daughter who lives in [place]   Patient moved to [place] from [place] in January 2009.   Patient is a retired [employer].    FAMILY HISTORY: Family History  Problem Relation Age of Onset  . Stroke Mother   . Hypertension Daughter     REVIEW OF SYSTEMS: Reviewed with the patient as per History of present illness. Psych: Patient does have some dental phobia.  DENTAL HISTORY:  CHIEF COMPLAINT: Patient referred by Dr. Basilio Cairo for a dental consultation.  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  Patient with anticipated chemoradiation therapy.  Patient is now seen as part of a medically necessary prechemoradiation therapy dental protocol examination.  The patient currently denies acute toothaches, swellings, or abscesses.  The patient was last seen on 01/26/2019 for an exam and cleaning at [facility].  The patient has been receiving treatment at [facility] for the past 3 to 5 years.  The patient has a treatment plan in place for periodontal maintenance procedure, multiple dental restorations, multiple crown and bridge restorations, and implant therapy for tooth #5 area.  The patient denies having any partial dentures.  Patient does have dental phobia by report.  DENTAL EXAMINATION: GENERAL: The patient is a well-developed, well-nourished female in no acute distress. HEAD AND NECK: There is no palpable neck lymphadenopathy.  The patient has bilateral crepitus upon maximum opening.  The patient, however, denies acute TMJ symptoms. INTRAORAL EXAM: The patient has normal saliva.  There is no evidence of oral abscess formation.  The patient has a mid palatal torus. DENTITION: Patient is missing tooth numbers 2, 5, 16, 17, 19, 29, and 31.  Tooth numbers 1 and 32 are impacted.  Multiple diastemas are noted.  There is supra eruption and drifting of the unopposed teeth into the edentulous areas.  There are multiple malpositioned teeth. PERIODONTAL: Patient has chronic periodontitis with plaque and calculus accumulations, gingival recession, and moderate to severe bone loss.  Tooth mobility is noted as per dental charting form. DENTAL CARIES/SUBOPTIMAL RESTORATIONS: Multiple dental caries and suboptimal dental restorations are noted.  Multiple flexure lesions are noted. ENDODONTIC: Patient currently denies acute pulpitis symptoms.  I do not see any evidence of periapical pathology.  The dental caries on tooth #5 are extensive and may be close  to impinging on the pulpal contents. CROWN AND BRIDGE: There is a crown restoration on tooth #30 with suboptimal mesial margin and distal recurrent caries.  Patient is currently treatment planned for multiple crown and bridge restorations. PROSTHODONTIC: There are no partial dentures. IMPLANT: Patient is currently treatment planned for an implant in the area of tooth #5.   OCCLUSION: The patient has a poor occlusal scheme secondary to multiple missing teeth, multiple diastemas, supra eruption and drifting of the unopposed teeth into the edentulous areas, multiple malpositioned teeth and lack of replacement of the missing teeth with dental prostheses.  RADIOGRAPHIC INTERPRETATION: An orthopantogram was taken and supplemented with a full series of dental radiographs.  This is suboptimal secondary to patient movement and lack of patient cooperation. There are multiple missing teeth.  There are impacted tooth numbers 1 and 32.  Multiple dental caries are noted.  Multiple suboptimal dental restorations are noted.  There is supra eruption and drifting of the unopposed teeth into the edentulous areas.  Multiple diastemas are noted.  Multiple malpositioned teeth are noted.   ASSESSMENTS: 1.  Squamous cell carcinoma of the left vocal cord 2.  Prechemoradiation therapy dental protocol 3.  Multiple dental caries 4.  Multiple suboptimal dental restorations 5.  Chronic periodontitis with bone loss 6.  Gingival recession 7.  Accretions 8.  Tooth mobility 9.  Multiple flexure lesions 10.  Multiple missing teeth 11.  Impacted tooth numbers 1 and 32 12.  Multiple diastemas 13.  Supra eruption and drifting of the unopposed teeth into the edentulous areas 14.  Multiple malpositioned teeth 15.  Poor occlusal scheme and malocclusion 16.  Mid palatal torus 17.  Bilateral TMJ crepitus on maximum opening with no history of acute TMJ symptoms   PLAN/RECOMMENDATIONSher 1. I discussed the risks, benefits, and  complications of various treatment options with the patient in relationship to her medical and dental conditions, anticipated chemoradiation therapy, and chemoradiation therapy side effects to include xerostomia, radiation caries, trismus, mucositis, taste changes, gum and jawbone changes, and risk for infection and osteoradionecrosis. We discussed various treatment options to include no treatment, multiple extractions with alveoloplasty, pre-prosthetic surgery as indicated, periodontal therapy, dental restorations, root canal therapy, crown and bridge therapy, implant therapy, and replacement of missing teeth as indicated.  We also discussed possible referral to an oral surgeon if indicated.  We also discussed the need to proceed with  urgent chemoradiation therapy at this time as per previous discussion with Dr. Basilio Cairo.  The patient agrees to defer any dental treatment at this time and will contact [facility] to discontinue scheduled periodontal maintenance procedures that were planned for December 2020.  Patient did agree to proceed with starting fluoride therapy.  A prescription for Prevident 5000 was sent to her [facility] pharmacy with refills for 1 year.  Brush on instructions were provided.  Scatter guards will not be fabricated since the patient is scheduled for simulation later today.  Patient will contact dental medicine for a periodic oral examination during chemoradiation therapy in 2 to 3 weeks if so desired.  Otherwise, the patient will follow up with Dental Medicine approximately 1 month after the chemoradiation therapy has been completed. Patient will contact Dental Medicine for an appointment if acute problems or questions arise.  2. Discussion of findings with medical team and coordination of future medical and dental care as needed.  I spent in excess of 135 minutes during the conduct of this consultation and >50% of this time involved direct face-to-face encounter for counseling and/or  coordination of the patient's care.    Charlynne Pander, DDS

## 2019-04-23 NOTE — Telephone Encounter (Signed)
Patient presented to office with questions about methotrexate.  She was recently diagnosed with cancer of the vocal cords and will be undergoing chemoradiation therapy.  She wanted to know how long she needs to hold her methotrexate.  Advised patient that she will need to hold methotrexate until after she is finished with treatment as it can increase adverse effects and complications.  Also advised patient that since she is receiving chemotherapy as well she needs to hold her Humira until after treatment.  Patient verbalized understanding.  Patient states she will be done the chemoradiation in January 2021.  Instructed patient to call the office when she has completed therapy so we can advised her on when she can resume methotrexate and Humria.  Patient verbalized understanding.  All questions encouraged and answered.  Instructed patient to call with any questions or concerns.  Mariella Saa, PharmD, [place], [place] Clinical Specialty Pharmacist [phone]  04/23/2019 2:47 PM

## 2019-04-23 NOTE — Patient Instructions (Addendum)
COVID-19 Education: The signs and symptoms of COVID-19 were discussed with the patient and how to seek care for testing (follow up with PCP or arrange E-visit).   The importance of social distancing was discussed today.  RADIATION THERAPY AND DECISIONS REGARDING YOUR TEETH  Xerostomia (dry mouth) Your salivary glands may be in the filed of radiation.  Radiation may include all or part of your saliva glands.  This will cause your saliva to dry up and you will have a dry mouth.  The dry mouth will be for the rest of your life unless your radiation oncologist tells you otherwise.  Your saliva has many functions:  Saliva wets your tongue for speaking.  It coats your teeth and the inside of your mouth for easier movement.  It helps with chewing and swallowing food.  It helps clean away harmful acid and toxic products made by the germs in your mouth, therefore it helps prevent cavities.  It kills some germs in your mouth and helps to prevent gum disease.  It helps to carry flavor to your taste buds.  Once you have lost your saliva you will be at higher risk for tooth decay and gum disease.  What can be done to help improve your mouth when there's not enough saliva:  1.  Your dentist may give a prescription for Salagen.  It will not bring back all of your saliva but may bring back some of it.  Also your saliva may be thick and ropy or white and foamy. It will not feel like it use to feel.  2.  You will need to swish with water every time your mouth feels dry.  YOU CANNOT suck on any cough drops, mints, lemon drops, candy, vitamin C or any other products.  You cannot use anything other than water to make your mouth feel less dry.  If you want to drink anything else you have to drink it all at once and brush afterwards.  Be sure to discuss the details of your diet habits with your dentist or hygienist.  Radiation caries: This is decay that happens very quickly once your mouth is very dry due to  radiation therapy.  Normally cavities take six months to two years to become a problem.  When you have dry mouth cavities may take as little as eight weeks to cause you a problem.  This is why dental check ups every two months are necessary as long as you have a dry mouth. Radiation caries typically, but not always, start at your gum line where it is hard to see the cavity.  It is therefore also hard to fill these cavities adequately.  This high rate of cavities happens because your mouth no longer has saliva and therefore the acid made by the germs starts the decay process.  Whenever you eat anything the germs in your mouth change the food into acid.  The acid then burns a small hole in your tooth.  This small hole is the beginning of a cavity.  If this is not treated then it will grow bigger and become a cavity.  The way to avoid this hole getting bigger is to use fluoride every evening as prescribed by your dentist.  You have to make sure that your teeth are very clean before you use the fluoride.  This fluoride in turn will strengthen your teeth and prepare them for another day of fighting acid.  If you develop radiation caries many times the damage is   so large that you will have to have all your teeth removed.  This could be a big problem if some of these teeth are in the field of radiation.  Further details of why this could be a big problem will follow.  (See Osteoradionecrosis).  Loss of taste (dysgeusia) This happens to varying degrees once you've had radiation therapy to your jaw region.  Many times taste is not completely lost but becomes limited.  The loss of taste is mostly due to radiation affecting your taste buds.  However if you have no saliva in your mouth to carry the flavor to your taste buds it would be difficult for your taste buds to taste anything.  That is why using water or a prescription for Salagen prior to meals and during meals may help with some of the taste.  Keep in mind that  taste generally returns very slowly over the course of several months or several years after radiation therapy.  Don't give up hope.  Trismus According to your Radiation Oncologist your TMJ or jaw joints are going to be partially or fully in the field of radiation.  This means that over time the muscles that help you open and close your mouth may get stiff.  This will potentially result in your not being able to open your mouth wide enough or as wide as you can open it now.  Le me give you an example of how slowly this happens and how unaware people are of it.  A gentlemen that had radiation therapy two years ago came back to me complaining that bananas are just too large for him to be able to fit them in between his teeth.  He was not able to open wide enough to bite into a banana.  This happens slowly and over a period of time.  What do we do to try and prevent this?  Your dentist will probably give you a stack of sticks called a trismus exercise device .  This stack will help your remind your muscles and your jaw joint to open up to the same distance every day.  Use these sticks every morning when you wake up according to the instructions given by the dentist.   You must use these sticks for at least one to two years after radiation therapy.  The reason for that is because it happens so slowly and keeps going on for about two years after radiation therapy.  Your hospital dentist will help you monitor your mouth opening and make sure that it's not getting smaller.  Osteoradionecrosis (ORN) This is a condition where your jaw bone after having had radiation therapy becomes very dry.  It has very little blood supply to keep it alive.  If you develop a cavity that turns into an abscess or an infection then the jaw bone does not have enough blood supply to help fight the infection.  At this point it is very likely that the infection could cause the death of your jaw bone.  When you have dead bone it has to be  removed.  Therefore you might end up having to have surgery to remove part of your jaw bone, the part of the jaw bone that has been affected.   Healing is also a problem if you are to have surgery in the areas where the bone has had radiation therapy.  The same reasons apply.  If you have surgery you need more blood supply which is not available.    When blood supply and oxygen are not available again, there is a chance for the bone to die.  Occasionally ORN happens on its own with no obvious reason.  This is quite rare.  We believe that patients who continue to smoke and/or drink alcohol have a higher chance of having this bone problem.  Therefore once your jaw bone has had radiation therapy if there are any teeth in that area, you should never have them pulled.  You should also never have any surgery on your teeth or gums in that area unless the oral surgeon or Periodontist is aware of your history of radiation. There is some expensive management techniques that might be used to limit your risks.  The risks for ORN either from infection or spontaneous ( or on it's own) are life long.    TRISMUS  Trismus is a condition where the jaw does not allow the mouth to open as wide as it usually does.  This can happen almost suddenly, or in other cases the process is so slow, it is hard to notice it-until it is too far along.  When the jaw joints and/or muscles have been exposed to radiation treatments, the onset of Trismus is very slow.  This is because the muscles are losing their stretching ability over a long period of time, as long as 2 YEARS after the end of radiation.  It is therefore important to exercise these muscles and joints.  TRISMUS EXERCISES   Stack of tongue depressors measuring the same or a little less than the last documented MIO (Maximum Interincisal Opening).  Secure them with a rubber band on both ends.  Place the stack in the patient's mouth, supporting the other end.  Allow 30  seconds for muscle stretching.  Rest for a few seconds.  Repeat 3-5 times  For all radiation patients, this exercise is recommended in the mornings and evenings unless otherwise instructed.  The exercise should be done for a period of 2 YEARS after the end of radiation.  MIO should be checked routinely on recall dental visits by the general dentist or the hospital dentist.  The patient is advised to report any changes, soreness, or difficulties encountered when doing the exercises.  

## 2019-04-23 NOTE — Progress Notes (Signed)
Head and Neck Cancer Simulation, IMRT treatment planning, and Special treatment procedure note   Outpatient  Diagnosis:    ICD-10-CM   1. 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)  C32.0     The patient was taken to the CT simulator and identity was confirmed.  All relevant records and images related to the planned course of therapy were reviewed.  The patient freely provided informed written consent to proceed with treatment after reviewing the details related to the planned course of therapy. The consent form was witnessed and verified by the simulation staff.    The patient was laid in the supine position on the table. An Aquaplast head and shoulder mask was custom fitted to the patient's anatomy. High-resolution CT axial imaging was obtained of the head and neck with contrast. I verified that the quality of the imaging is good for treatment planning. 1 Medically Necessary Treatment Device was fabricated and supervised by me: Aquaplast mask.  Treatment planning note I plan to treat the patient with IMRT. I plan to treat the patient's tumor and bilateral neck nodes. I plan to treat to a total dose of 70 Gray in 35  fractions. Dose calculation was ordered from dosimetry.  IMRT planning Note  IMRT is medically necessary and an important modality to deliver adequate dose to the patient's at risk tissues while sparing the patient's normal structures, including the: esophagus, parotid tissue, mandible, brain stem, spinal cord, oral cavity, brachial plexus.  This justifies the use of IMRT in the patient's treatment.   Special Treatment Procedure Note:  The patient will be receiving chemotherapy concurrently. Chemotherapy heightens the risk of side effects. I have considered this during the patient's treatment planning process and will monitor the patient accordingly for side effects on a weekly basis. Concurrent chemotherapy increases the complexity of this patient's treatment and therefore  this constitutes a special treatment procedure.  -----------------------------------  Eppie Gibson, MD

## 2019-04-24 LAB — TSH: TSH: 2.438 u[IU]/mL (ref 0.308–3.960)

## 2019-04-24 LAB — NOVEL CORONAVIRUS, NAA (HOSP ORDER, SEND-OUT TO REF LAB; TAT 18-24 HRS): SARS-CoV-2, NAA: NOT DETECTED

## 2019-04-24 NOTE — Progress Notes (Signed)
Oncology Nurse Navigator Documentation  Ms. Defina arrived for PEG/port education following this morning's Dental Medicine appt.  Placements are scheduled for Thursday.  Provided port educational handout, showed her example, provided guidance for post-surgical dsg removal.  . Provided PEG educational handout.  Using  PEG teaching device   and Teach Back, provided education for PEG use and care, including: hand hygiene, gravity bolus administration of daily water flushes and nutritional supplement, fluids and medications; care of tube insertion site including daily dressing change and cleaning; S&S of infection.   . Ms. Dunlap correctly verbalized and provided correct return demonstration of gravity administration of water, dressing change and cleaning procedures.  . I provided written instructions for PEG flushing/dressing change in support of verbal instruction.   Marland Kitchen Described contents of Start of Care Bolus Feeding Kit which I provided.  She voiced understanding she is to start using Osmolite per guidance of Nutrition. . She voiced understanding I will be available for ongoing PEG support.  Provided barium sulfate prep which I obtained from [facility], reviewed instructions which included guidance for COVID screening at [facility].   Provided New Patient Information packet, discussed contents: o Contact information for physician(s), myself, other members of the Care Team. o Advance Directive information ([place] blue pamphlet with LCSW contact info). o Fall Prevention Patient [facility] campus map with highlight of [place] o SLP information sheet o Symptom Management Clinic information  Escorted her to [facility] after which I toured her to [facility] treatment area, explained procedures for lobby registration, arrival to Radiation Waiting, arrival to tmt area and preparation for tmt.  Provided/reviewed Epic appt calendar.    I  encouraged her to call me with questions/concerns prior to upcoming procedures/RT New Start.  [name], RN, BSN Head & Neck Oncology Nurse [place] [facility] [place] [phone]

## 2019-04-25 ENCOUNTER — Other Ambulatory Visit: Payer: Self-pay | Admitting: Radiology

## 2019-04-26 ENCOUNTER — Other Ambulatory Visit: Payer: Self-pay

## 2019-04-26 ENCOUNTER — Ambulatory Visit (HOSPITAL_COMMUNITY)
Admission: RE | Admit: 2019-04-26 | Discharge: 2019-04-26 | Disposition: A | Discharge status: Home / Self Care | Payer: Medicare Other | Source: Ambulatory Visit | Attending: Hematology | Admitting: Hematology

## 2019-04-26 ENCOUNTER — Encounter (HOSPITAL_COMMUNITY): Payer: Self-pay

## 2019-04-26 DIAGNOSIS — Z87891 Personal history of nicotine dependence: Secondary | ICD-10-CM | POA: Diagnosis not present

## 2019-04-26 DIAGNOSIS — Z431 Encounter for attention to gastrostomy: Secondary | ICD-10-CM | POA: Diagnosis not present

## 2019-04-26 DIAGNOSIS — M069 Rheumatoid arthritis, unspecified: Secondary | ICD-10-CM | POA: Diagnosis not present

## 2019-04-26 DIAGNOSIS — Z5111 Encounter for antineoplastic chemotherapy: Secondary | ICD-10-CM | POA: Diagnosis not present

## 2019-04-26 DIAGNOSIS — C320 Malignant neoplasm of glottis: Secondary | ICD-10-CM | POA: Insufficient documentation

## 2019-04-26 DIAGNOSIS — M1990 Unspecified osteoarthritis, unspecified site: Secondary | ICD-10-CM | POA: Insufficient documentation

## 2019-04-26 DIAGNOSIS — I10 Essential (primary) hypertension: Secondary | ICD-10-CM | POA: Diagnosis not present

## 2019-04-26 HISTORY — PX: IR GASTROSTOMY TUBE MOD SED: IMG625

## 2019-04-26 HISTORY — PX: IR IMAGING GUIDED PORT INSERTION: IMG5740

## 2019-04-26 LAB — PROTIME-INR
INR: 1 (ref 0.8–1.2)
Prothrombin Time: 12.8 seconds (ref 11.4–15.2)

## 2019-04-26 LAB — BASIC METABOLIC PANEL
Anion gap: 9 (ref 5–15)
BUN: 10 mg/dL (ref 8–23)
CO2: 22 mmol/L (ref 22–32)
Calcium: 9.4 mg/dL (ref 8.9–10.3)
Chloride: 108 mmol/L (ref 98–111)
Creatinine, Ser: 0.78 mg/dL (ref 0.44–1.00)
GFR calc Af Amer: >60 mL/min (ref >60)
GFR calc non Af Amer: >60 mL/min (ref >60)
Glucose, Bld: 87 mg/dL (ref 70–99)
Potassium: 4 mmol/L (ref 3.5–5.1)
Sodium: 139 mmol/L (ref 135–145)

## 2019-04-26 LAB — CBC
HCT: 37.6 % (ref 36.0–46.0)
Hemoglobin: 11.7 g/dL — ABNORMAL LOW (ref 12.0–15.0)
MCH: 31.5 pg (ref 26.0–34.0)
MCHC: 31.1 g/dL (ref 30.0–36.0)
MCV: 101.1 fL — ABNORMAL HIGH (ref 80.0–100.0)
Platelets: 419 10*3/uL — ABNORMAL HIGH (ref 150–400)
RBC: 3.72 MIL/uL — ABNORMAL LOW (ref 3.87–5.11)
RDW: 13.9 % (ref 11.5–15.5)
WBC: 8.5 10*3/uL (ref 4.0–10.5)
nRBC: 0 % (ref 0.0–0.2)

## 2019-04-26 MED ORDER — LIDOCAINE-EPINEPHRINE (PF) 1 %-1:200000 IJ SOLN
INTRAMUSCULAR | Status: AC
  Filled 2019-04-26: qty 30

## 2019-04-26 MED ORDER — GLUCAGON HCL RDNA (DIAGNOSTIC) 1 MG IJ SOLR
INTRAMUSCULAR | Status: AC | PRN
  Administered 2019-04-26: 1 mg via INTRAVENOUS

## 2019-04-26 MED ORDER — HEPARIN SOD (PORK) LOCK FLUSH 100 UNIT/ML IV SOLN
INTRAVENOUS | Status: AC
  Filled 2019-04-26: qty 5

## 2019-04-26 MED ORDER — SODIUM CHLORIDE 0.9 % IV SOLN: INTRAVENOUS | Status: DC

## 2019-04-26 MED ORDER — LIDOCAINE HCL (PF) 1 % IJ SOLN
INTRAMUSCULAR | Status: DC | PRN
  Administered 2019-04-26: 10 mL

## 2019-04-26 MED ORDER — HYDROMORPHONE HCL 1 MG/ML IJ SOLN
INTRAMUSCULAR | Status: AC
  Filled 2019-04-26: qty 1

## 2019-04-26 MED ORDER — LIDOCAINE-EPINEPHRINE (PF) 1 %-1:200000 IJ SOLN
INTRAMUSCULAR | Status: AC | PRN
  Administered 2019-04-26: 10 mL via INTRADERMAL

## 2019-04-26 MED ORDER — MIDAZOLAM HCL 2 MG/2ML IJ SOLN
INTRAMUSCULAR | Status: AC
  Filled 2019-04-26: qty 2

## 2019-04-26 MED ORDER — FENTANYL CITRATE (PF) 100 MCG/2ML IJ SOLN
INTRAMUSCULAR | Status: AC | PRN
  Administered 2019-04-26: 50 ug via INTRAVENOUS

## 2019-04-26 MED ORDER — HYDROMORPHONE HCL 1 MG/ML IJ SOLN
1.0000 mg | INTRAMUSCULAR | Status: DC | PRN
  Administered 2019-04-26: 1 mg via INTRAVENOUS

## 2019-04-26 MED ORDER — HYDROCODONE-ACETAMINOPHEN 5-325 MG PO TABS
ORAL_TABLET | ORAL | Status: AC
  Filled 2019-04-26: qty 1

## 2019-04-26 MED ORDER — MIDAZOLAM HCL 2 MG/2ML IJ SOLN
INTRAMUSCULAR | Status: AC | PRN
  Administered 2019-04-26: 0.5 mg via INTRAVENOUS
  Administered 2019-04-26: 1 mg via INTRAVENOUS

## 2019-04-26 MED ORDER — HYDROCODONE-ACETAMINOPHEN 5-325 MG PO TABS
1.0000 | ORAL_TABLET | ORAL | Status: DC | PRN
  Administered 2019-04-26: 1 via ORAL

## 2019-04-26 MED ORDER — CEFAZOLIN SODIUM-DEXTROSE 2-4 GM/100ML-% IV SOLN
2.0000 g | INTRAVENOUS | Status: AC
  Administered 2019-04-26: 2 g via INTRAVENOUS

## 2019-04-26 MED ORDER — FENTANYL CITRATE (PF) 100 MCG/2ML IJ SOLN
INTRAMUSCULAR | Status: AC | PRN
  Administered 2019-04-26: 12.5 ug via INTRAVENOUS
  Administered 2019-04-26: 25 ug via INTRAVENOUS

## 2019-04-26 MED ORDER — LIDOCAINE HCL (PF) 1 % IJ SOLN
INTRAMUSCULAR | Status: AC | PRN
  Administered 2019-04-26: 15 mL

## 2019-04-26 MED ORDER — IOHEXOL 300 MG/ML  SOLN
50.0000 mL | Freq: Once | INTRAMUSCULAR | Status: AC | PRN
  Administered 2019-04-26: 15 mL

## 2019-04-26 MED ORDER — LIDOCAINE HCL 1 % IJ SOLN
INTRAMUSCULAR | Status: AC
  Filled 2019-04-26: qty 20

## 2019-04-26 MED ORDER — FENTANYL CITRATE (PF) 100 MCG/2ML IJ SOLN
INTRAMUSCULAR | Status: AC
  Filled 2019-04-26: qty 2

## 2019-04-26 MED ORDER — MIDAZOLAM HCL 2 MG/2ML IJ SOLN
INTRAMUSCULAR | Status: AC | PRN
  Administered 2019-04-26 (×2): 0.5 mg via INTRAVENOUS

## 2019-04-26 MED ORDER — HEPARIN SOD (PORK) LOCK FLUSH 100 UNIT/ML IV SOLN
INTRAVENOUS | Status: AC | PRN
  Administered 2019-04-26: 500 [IU] via INTRAVENOUS

## 2019-04-26 MED ORDER — GLUCAGON HCL RDNA (DIAGNOSTIC) 1 MG IJ SOLR
INTRAMUSCULAR | Status: AC
  Filled 2019-04-26: qty 1

## 2019-04-26 MED ORDER — CEFAZOLIN SODIUM-DEXTROSE 2-4 GM/100ML-% IV SOLN
INTRAVENOUS | Status: AC
  Filled 2019-04-26: qty 100

## 2019-04-26 NOTE — Discharge Instructions (Signed)
Gastrostomy Tube Home Guide, Adult A gastrostomy tube, or G-tube, is a tube that is inserted through the abdomen into the stomach. The tube is used to give feedings and medicines when a person is unable to eat and drink enough on his or her own. How to care for a G-tube Supplies needed  Saline solution or clean, warm water and soap.  Cotton swab or gauze.  Precut gauze bandage (dressing) and tape, if needed. Instructions 1. Wash your hands with soap and water. 2. If there is a dressing between the person's skin and the tube, remove it. 3. Check the area where the tube enters the skin. Check for problems such as: ? Redness. ? Swelling. ? Pus-like drainage. ? Extra skin growth. 4. Moisten the cotton swab with the saline solution or soap and water mixture. Gently clean around the insertion site. Remove any drainage or crusting. ? When the G-tube is first put in, a normal saline solution or water can be used to clean the skin. ? Mild soap and warm water can be used when the skin around the G-tube site has healed. 5. If there should be a dressing between the person's skin and the tube, apply it at this time. How to flush a G-tube Flush the G-tube regularly to keep it from clogging. Flush it before and after feedings and as often as told by the health care provider. Supplies needed  Purified or sterile water, warmed. If the person has a weak disease-fighting (immune) system, or if he or she has difficulty fighting off infections (is immunocompromised), use only sterile water. ? If you are unsure about the amount of chemical contaminants in purified or drinking water, use sterile water. ? To purify drinking water by boiling:  Boil water for at least 1 minute. Keep lid over water while it boils. Allow water to cool to room temperature before using.  60cc G-tube syringe. Instructions 1. Wash your hands with soap and water. 2. Draw up 30 mL of warm water in a syringe. 3. Connect the  syringe to the tube. 4. Slowly and gently push the water into the tube. G-tube problems and solutions  If the tube comes out: ? Cover the opening with a clean dressing and tape. ? Call a health care provider right away. ? A health care provider will need to put the tube back in within 4 hours.  If there is skin or scar tissue growing where the tube enters the skin: ? Keep the area clean and dry. ? Secure the tube with tape so that the tube does not move around too much. ? Call a health care provider.  If the tube gets clogged: ? Slowly push warm water into the tube with a large syringe. ? Do not force the fluid into the tube or push an object into the tube. ? If you are not able to unclog the tube, call a health care provider right away. Follow these instructions at home: Feedings  Give feedings at room temperature.  Cover and place unused feedings in the refrigerator.  If feedings are continuous: ? Do not put more than 4 hours worth of feedings in the feeding bag. ? Stop the feedings when you need to give medicine or flush the tube. Be sure to restart the feedings. ? Make sure the person's head is above his or her stomach (upright position). This will prevent choking and discomfort.  Replace feeding bags and syringes as told by the health care provider.  Make  sure the person is in the right position during and after feedings: ? During feedings, the person's position should be in the upright position. ? After a noncontinuous feeding (bolus feeding), have the person stay in the upright position for 1 hour. General instructions  Only use syringes made for G-tubes.  Do not pull or put tension on the tube.  Clamp the tube before removing the cap or disconnecting a syringe.  Measure the length of the G-tube every day from the insertion site to the end of the tube.  If the person's G-tube has a balloon, check the fluid in the balloon every week. The amount of fluid that should  be in the balloon can be found in the manufacturers specifications.  Make sure the person takes care of his or her oral health, such as by brushing his or her teeth.  Remove excess air from the G-tube as told by the person's health care provider. This is called "venting."  Keep the area where the tube enters the skin clean and dry.  Do not push feedings, medicines, or flushes rapidly. Contact a health care provider if:  The person with the tube has any of these problems: ? Constipation. ? Fever.  There is a large amount of fluid or mucus-like liquid leaking from the tube.  Skin or scar tissue appears to be growing where the tube enters the skin.  The length of tube from the insertion site to the G-tube gets longer. Get help right away if:  The person with the tube has any of these problems: ? Severe abdominal pain. ? Severe tenderness. ? Severe bloating. ? Nausea. ? Vomiting. ? Trouble breathing. ? Shortness of breath.  Any of these problems happen in the area where the tube enters the skin: ? Redness, irritation, swelling, or soreness. ? Pus-like discharge. ? A bad smell.  The tube is clogged and cannot be flushed.  The tube comes out. Summary  A gastrostomy tube, or G-tube, is a tube that is inserted through the abdomen into the stomach. The tube is used to give feedings and medicines when a person is unable to eat and drink enough on his or her own.  Check and clean the insertion site daily as told by the person's health care provider.  Flush the G-tube regularly to keep it from clogging. Flush it before and after feedings and as often as told by the person's health care provider.  Keep the area where the tube enters the skin clean and dry. This information is not intended to replace advice given to you by your health care provider. Make sure you discuss any questions you have with your health care provider. Document Released: 08/16/2001 Document Revised:  05/20/2017 Document Reviewed: 08/02/2016 Elsevier Patient Education  [place] Insertion, Care After This sheet gives you information about how to care for yourself after your procedure. Your health care provider may also give you more specific instructions. If you have problems or questions, contact your health care provider. What can I expect after the procedure? After the procedure, it is common to have:  Discomfort at the port insertion site.  Bruising on the skin over the port. This should improve over 3-4 days. Follow these instructions at home: [facility] care  After your port is placed, you will get a manufacturer's information card. The card has information about your port. Keep this card with you at all times.  Take care of the port as told by your health  care provider. Ask your health care provider if you or a family member can get training for taking care of the port at home. A home health care nurse may also take care of the port.  Make sure to remember what type of port you have. Incision care      Follow instructions from your health care provider about how to take care of your port insertion site. Make sure you: ? Wash your hands with soap and water before and after you change your bandage (dressing). If soap and water are not available, use hand sanitizer. ? Change your dressing as told by your health care provider. ? Leave stitches (sutures), skin glue, or adhesive strips in place. These skin closures may need to stay in place for 2 weeks or longer. If adhesive strip edges start to loosen and curl up, you may trim the loose edges. Do not remove adhesive strips completely unless your health care provider tells you to do that.  Check your port insertion site every day for signs of infection. Check for: ? Redness, swelling, or pain. ? Fluid or blood. ? Warmth. ? Pus or a bad smell. Activity  Return to your normal activities as told by your health  care provider. Ask your health care provider what activities are safe for you.  Do not lift anything that is heavier than 10 lb (4.5 kg), or the limit that you are told, until your health care provider says that it is safe. General instructions  Take over-the-counter and prescription medicines only as told by your health care provider.  Do not take baths, swim, or use a hot tub until your health care provider approves. Ask your health care provider if you may take showers. You may only be allowed to take sponge baths.  Do not drive for 24 hours if you were given a sedative during your procedure.  Wear a medical alert bracelet in case of an emergency. This will tell any health care providers that you have a port.  Keep all follow-up visits as told by your health care provider. This is important. Contact a health care provider if:  You cannot flush your port with saline as directed, or you cannot draw blood from the port.  You have a fever or chills.  You have redness, swelling, or pain around your port insertion site.  You have fluid or blood coming from your port insertion site.  Your port insertion site feels warm to the touch.  You have pus or a bad smell coming from the port insertion site. Get help right away if:  You have chest pain or shortness of breath.  You have bleeding from your port that you cannot control. Summary  Take care of the port as told by your health care provider. Keep the manufacturer's information card with you at all times.  Change your dressing as told by your health care provider.  Contact a health care provider if you have a fever or chills or if you have redness, swelling, or pain around your port insertion site.  Keep all follow-up visits as told by your health care provider. This information is not intended to replace advice given to you by your health care provider. Make sure you discuss any questions you have with your health care  provider. Document Released: 03/28/2013 Document Revised: 01/03/2018 Document Reviewed: 01/03/2018 Elsevier Patient Education  [place].

## 2019-04-26 NOTE — Progress Notes (Signed)
Patient feeling nauseated.  "it comes and it goes and I will take something at home.  I want to leave and go to bed."

## 2019-04-26 NOTE — Procedures (Signed)
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  Procedure: Port placement and gastrostomy tube placement.  Findings: Right jugular port, tip in lower SVC?  20 Fr G-tube in stomach.  Complications: None     EBL: Minimal, less than 20 ml  Plan: Discharge in 3 hours.  Keep port site and incisions dry for at least 24 hours.  Keep NPO except ice chips until 6 pm, then switch to clear liquids.  Regular diet tomorrow.      Jayani Rozman R. Anselm Pancoast, MD  Pager: [phone]

## 2019-04-26 NOTE — Sedation Documentation (Addendum)
Pt also getting g tube new sedation starting now will continue to chart q5

## 2019-04-26 NOTE — Discharge Instructions (Addendum)
Implanted Port Insertion Implanted port insertion is a procedure to put in a port and catheter. The port is a device with an injectable disk that can be accessed by your health care provider. The port is connected to a vein in the chest or neck by a small flexible tube (catheter). There are different types of ports. The implanted port may be used as a long-term IV access for:  Medicines, such as chemotherapy.  Fluids.  Liquid nutrition, such as total parenteral nutrition (TPN). When you have a port, this means that your health care provider will not need to use the veins in your arms for these procedures. Tell a health care provider about:  Any allergies you have.  All medicines you are taking, especially blood thinners, as well as any vitamins, herbs, eye drops, creams, over-the-counter medicines, and steroids.  Any problems you or family members have had with anesthetic medicines.  Any blood disorders you have.  Any surgeries you have had.  Any medical conditions you have or have had, including diabetes or kidney problems.  Whether you are pregnant or may be pregnant. What are the risks? Generally, this is a safe procedure. However, problems may occur, including:  Allergic reactions to medicines or dyes.  Damage to other structures or organs.  Infection.  Damage to the blood vessel, bruising, or bleeding at the puncture site.  Blood clot.  Breakdown of the skin over the port.  A collection of air in the chest that can cause one of the lungs to collapse (pneumothorax). This is rare. What happens before the procedure? Medicines  Ask your health care provider about: ? Changing or stopping your regular medicines. This is especially important if you are taking diabetes medicines or blood thinners. ? Taking medicines such as aspirin and ibuprofen. These medicines can thin your blood. Do not take these medicines unless your health care provider tells you to take  them. ? Taking over-the-counter medicines, vitamins, herbs, and supplements. Staying hydrated Follow instructions from your health care provider about hydration, which may include:  Up to 2 hours before the procedure - you may continue to drink clear liquids, such as water, clear fruit juice, black coffee, and plain tea.  Eating and drinking restrictions  Follow instructions from your health care provider about eating and drinking, which may include: ? 8 hours before the procedure - stop eating heavy meals or foods, such as meat, fried foods, or fatty foods. ? 6 hours before the procedure - stop eating light meals or foods, such as toast or cereal. ? 6 hours before the procedure - stop drinking milk or drinks that contain milk. ? 2 hours before the procedure - stop drinking clear liquids. General instructions  Plan to have someone take you home from the hospital or clinic.  If you will be going home right after the procedure, plan to have someone with you for 24 hours.  You may have blood tests.  Do not use any products that contain nicotine or tobacco for at least 4-6 weeks before the procedure. These products include cigarettes, e-cigarettes, and chewing tobacco. If you need help quitting, ask your health care provider.  Ask your health care provider what steps will be taken to help prevent infection. These may include: ? Removing hair at the surgery site. ? Washing skin with a germ-killing soap. ? Taking antibiotic medicine. What happens during the procedure?   An IV will be inserted into one of your veins.  You will be given  one or more of the following: ? A medicine to help you relax (sedative). ? A medicine to numb the area (local anesthetic).  Two small incisions will be made to insert the port. ? One smaller incision will be made in your neck to get access to the vein where the catheter will lie. ? The other incision will be made in the upper chest. This is where the  port will lie.  The procedure may be done using continuous X-ray (fluoroscopy) or other imaging tools for guidance.  The port and catheter will be placed. There may be a small, raised area where the port is.  The port will be flushed with a salt solution (saline), and blood will be drawn to make sure that it is working correctly.  The incisions will be closed.  Bandages (dressings) may be placed over the incisions. The procedure may vary among health care providers and hospitals. What happens after the procedure?  Your blood pressure, heart rate, breathing rate, and blood oxygen level will be monitored until you leave the hospital or clinic.  Do not drive for 24 hours if you were given a sedative during your procedure.  You will be given a manufacturer's information card for the type of port that you have. Keep this with you.  Your port will need to be flushed and checked as told by your health care provider, usually every few weeks.  A chest X-ray will be done to: ? Check the placement of the port. ? Make sure there is no injury to your lung. Summary  Implanted port insertion is a procedure to put in a port and catheter.  The implanted port is used as a long-term IV access.  The port will need to be flushed and checked as told by your health care provider, usually every few weeks.  Keep your manufacturer's information card with you at all times. This information is not intended to replace advice given to you by your health care provider. Make sure you discuss any questions you have with your health care provider. Document Released: 03/28/2013 Document Revised: 09/29/2018 Document Reviewed: 01/03/2018 Elsevier Patient Education  [place]. Gastrostomy Tube Home Guide, Adult A gastrostomy tube, or G-tube, is a tube that is inserted through the abdomen into the stomach. The tube is used to give feedings and medicines when a person is unable to eat and drink enough on his  or her own. How to care for a G-tube Supplies needed  Saline solution or clean, warm water and soap.  Cotton swab or gauze.  Precut gauze bandage (dressing) and tape, if needed. Instructions 1. Wash your hands with soap and water. 2. If there is a dressing between the person's skin and the tube, remove it. 3. Check the area where the tube enters the skin. Check for problems such as: ? Redness. ? Swelling. ? Pus-like drainage. ? Extra skin growth. 4. Moisten the cotton swab with the saline solution or soap and water mixture. Gently clean around the insertion site. Remove any drainage or crusting. ? When the G-tube is first put in, a normal saline solution or water can be used to clean the skin. ? Mild soap and warm water can be used when the skin around the G-tube site has healed. 5. If there should be a dressing between the person's skin and the tube, apply it at this time. How to flush a G-tube Flush the G-tube regularly to keep it from clogging. Flush it before and  after feedings and as often as told by the health care provider. Supplies needed  Purified or sterile water, warmed. If the person has a weak disease-fighting (immune) system, or if he or she has difficulty fighting off infections (is immunocompromised), use only sterile water. ? If you are unsure about the amount of chemical contaminants in purified or drinking water, use sterile water. ? To purify drinking water by boiling:  Boil water for at least 1 minute. Keep lid over water while it boils. Allow water to cool to room temperature before using.  60cc G-tube syringe. Instructions 1. Wash your hands with soap and water. 2. Draw up 30 mL of warm water in a syringe. 3. Connect the syringe to the tube. 4. Slowly and gently push the water into the tube. G-tube problems and solutions  If the tube comes out: ? Cover the opening with a clean dressing and tape. ? Call a health care provider right away. ? A health care  provider will need to put the tube back in within 4 hours.  If there is skin or scar tissue growing where the tube enters the skin: ? Keep the area clean and dry. ? Secure the tube with tape so that the tube does not move around too much. ? Call a health care provider.  If the tube gets clogged: ? Slowly push warm water into the tube with a large syringe. ? Do not force the fluid into the tube or push an object into the tube. ? If you are not able to unclog the tube, call a health care provider right away. Follow these instructions at home: Feedings  Give feedings at room temperature.  Cover and place unused feedings in the refrigerator.  If feedings are continuous: ? Do not put more than 4 hours worth of feedings in the feeding bag. ? Stop the feedings when you need to give medicine or flush the tube. Be sure to restart the feedings. ? Make sure the person's head is above his or her stomach (upright position). This will prevent choking and discomfort.  Replace feeding bags and syringes as told by the health care provider.  Make sure the person is in the right position during and after feedings: ? During feedings, the person's position should be in the upright position. ? After a noncontinuous feeding (bolus feeding), have the person stay in the upright position for 1 hour. General instructions  Only use syringes made for G-tubes.  Do not pull or put tension on the tube.  Clamp the tube before removing the cap or disconnecting a syringe.  Measure the length of the G-tube every day from the insertion site to the end of the tube.  If the person's G-tube has a balloon, check the fluid in the balloon every week. The amount of fluid that should be in the balloon can be found in the manufacturers specifications.  Make sure the person takes care of his or her oral health, such as by brushing his or her teeth.  Remove excess air from the G-tube as told by the person's health care  provider. This is called "venting."  Keep the area where the tube enters the skin clean and dry.  Do not push feedings, medicines, or flushes rapidly. Contact a health care provider if:  The person with the tube has any of these problems: ? Constipation. ? Fever.  There is a large amount of fluid or mucus-like liquid leaking from the tube.  Skin or scar  tissue appears to be growing where the tube enters the skin.  The length of tube from the insertion site to the G-tube gets longer. Get help right away if:  The person with the tube has any of these problems: ? Severe abdominal pain. ? Severe tenderness. ? Severe bloating. ? Nausea. ? Vomiting. ? Trouble breathing. ? Shortness of breath.  Any of these problems happen in the area where the tube enters the skin: ? Redness, irritation, swelling, or soreness. ? Pus-like discharge. ? A bad smell.  The tube is clogged and cannot be flushed.  The tube comes out. Summary  A gastrostomy tube, or G-tube, is a tube that is inserted through the abdomen into the stomach. The tube is used to give feedings and medicines when a person is unable to eat and drink enough on his or her own.  Check and clean the insertion site daily as told by the person's health care provider.  Flush the G-tube regularly to keep it from clogging. Flush it before and after feedings and as often as told by the person's health care provider.  Keep the area where the tube enters the skin clean and dry. This information is not intended to replace advice given to you by your health care provider. Make sure you discuss any questions you have with your health care provider. Document Released: 08/16/2001 Document Revised: 05/20/2017 Document Reviewed: 08/02/2016 Elsevier Patient Education  [place]. Moderate Conscious Sedation, Adult, Care After These instructions provide you with information about caring for yourself after your procedure. Your health  care provider may also give you more specific instructions. Your treatment has been planned according to current medical practices, but problems sometimes occur. Call your health care provider if you have any problems or questions after your procedure. What can I expect after the procedure? After your procedure, it is common:  To feel sleepy for several hours.  To feel clumsy and have poor balance for several hours.  To have poor judgment for several hours.  To vomit if you eat too soon. Follow these instructions at home: For at least 24 hours after the procedure:   Do not: ? Participate in activities where you could fall or become injured. ? Drive. ? Use heavy machinery. ? Drink alcohol. ? Take sleeping pills or medicines that cause drowsiness. ? Make important decisions or sign legal documents. ? Take care of children on your own.  Rest. Eating and drinking  Follow the diet recommended by your health care provider.  If you vomit: ? Drink water, juice, or soup when you can drink without vomiting. ? Make sure you have little or no nausea before eating solid foods. General instructions  Have a responsible adult stay with you until you are awake and alert.  Take over-the-counter and prescription medicines only as told by your health care provider.  If you smoke, do not smoke without supervision.  Keep all follow-up visits as told by your health care provider. This is important. Contact a health care provider if:  You keep feeling nauseous or you keep vomiting.  You feel light-headed.  You develop a rash.  You have a fever. Get help right away if:  You have trouble breathing. This information is not intended to replace advice given to you by your health care provider. Make sure you discuss any questions you have with your health care provider. Document Released: 03/28/2013 Document Revised: 05/20/2017 Document Reviewed: 09/27/2015 Elsevier Patient Education  2020  [employer].

## 2019-04-26 NOTE — H&P (Signed)
Chief Complaint: Patient was seen in consultation today for percutaneous gastric tube placement and Port a cath placement at the request of [facility]  Referring Physician(s): Zhao,Yan  Supervising Physician: Markus Daft  Patient Status: [facility] - Out-pt  History of Present Illness: Jean [name] is a 73 y.o. female   Head neck cancer 03/26/2019 Left vocal cord squamous cell carcinoma CT 04/10/19: IMPRESSION: 1. Left glottic mass extending to but not beyond the anterior commissure. There is extension into the left paraglottic fat without cartilage erosion. 2. No adenopathy.  To start chemo and radiation next week Already with some dysphagia Dr Maylon Peppers requesting PAC and Gastric tube placement in preparation for treatments    Past Medical History:  Diagnosis Date   Arthritis    Hypertension    Rheumatoid arthritis ([place])    Vocal cord mass     Past Surgical History:  Procedure Laterality Date   ECTOPIC PREGNANCY SURGERY     EXTERNAL EAR SURGERY Right    removal of cyst or gland   MICROLARYNGOSCOPY Left 03/26/2019   Procedure: DIRECT MICROLARYNGOSCOPY WITH BIOPSY OF VOCAL CORD MASS;  Surgeon: Leta Baptist, MD;  Location: [place];  Service: ENT;  Laterality: Left;    Allergies: Sulfa antibiotics  Medications: Prior to Admission medications   Medication Sig Start Date End Date Taking? Authorizing Provider  Adalimumab (HUMIRA PEN) 40 MG/0.4ML PNKT Inject 40 mg into the skin every 14 (fourteen) days. Patient taking differently: Inject 40 mg into the skin every 21 ( twenty-one) days.  10/30/18  Yes Deveshwar, Abel Presto, MD  amLODipine (NORVASC) 5 MG tablet Take 5 mg by mouth daily. 08/20/16  Yes [provider]  Cholecalciferol (VITAMIN D) 50 MCG (2000 UT) CAPS Take 2,000 Units by mouth daily.    Yes [provider]  folic acid (FOLVITE) 1 MG tablet TAKE 2 TABLETS(2 MG) BY MOUTH DAILY Patient taking differently: Take 2 mg by mouth  daily.  09/19/18  Yes Deveshwar, Abel Presto, MD  losartan (COZAAR) 100 MG tablet Take 100 mg by mouth daily.  08/04/16  Yes [provider]  methotrexate 50 MG/2ML injection INJECT 0.6 ML(15 MG TOTAL) UNDER THE SKIN ONCE A WEEK 07/31/18  Yes Deveshwar, Abel Presto, MD  vitamin B-12 (CYANOCOBALAMIN) 1000 MCG tablet Take 1,000 mcg by mouth daily.   Yes [provider]  ALLERGIST TRAY 1CC 27GX1/2" 27G X 1/2" 1 ML KIT TO BE USED WITH WEEKLY METHOTREXATE INJECTIONS ONCE A WEEK 09/15/18   Bo Merino, MD  dexamethasone (DECADRON) 4 MG tablet Take 2 tablets by mouth once a day on the day after chemotherapy and then take 2 tablets two times a day for 2 days. Take with food. 04/18/19   Tish Men, MD  lidocaine-prilocaine (EMLA) cream Apply to affected area once 04/18/19   Tish Men, MD  LORazepam (ATIVAN) 0.5 MG tablet Take 1 tablet (0.5 mg total) by mouth every 6 (six) hours as needed (Nausea or vomiting). 04/18/19   Tish Men, MD  ondansetron (ZOFRAN) 4 MG tablet Take 1 tablet (4 mg total) by mouth every 6 (six) hours as needed for nausea or vomiting. 02/01/19   Bo Merino, MD  ondansetron (ZOFRAN) 8 MG tablet Take 1 tablet (8 mg total) by mouth 2 (two) times daily as needed. Start on the third day after chemotherapy. 04/18/19   Tish Men, MD  prochlorperazine (COMPAZINE) 10 MG tablet Take 1 tablet (10 mg total) by mouth every 6 (six) hours as needed (Nausea or vomiting). 04/18/19  Tish Men, MD  sodium fluoride (PREVIDENT 5000 PLUS) 1.1 % CREA dental cream Apply to tooth brush. Brush teeth for 2 minutes. Spit out excess-DO NOT swallow. DO NOT rinse afterwards. Repeat nightly. 04/23/19   Lenn Cal, DDS     Family History  Problem Relation Age of Onset   Stroke Mother    Hypertension Daughter     Social History   Socioeconomic History   Marital status: Widowed    Spouse name: Not on file   Number of children: 1   Years of education: Not on file   Highest education  level: Not on file  Occupational History   Not on file  Social Needs   Financial resource strain: Not on file   Food insecurity    Worry: Not on file    Inability: Not on file   Transportation needs    Medical: No    Non-medical: No  Tobacco Use   Smoking status: Former Smoker    Packs/day: 1.00    Years: 30.00    Pack years: 30.00    Quit date: 11/08/2003    Years since quitting: 15.4   Smokeless tobacco: Never Used  Substance and Sexual Activity   Alcohol use: Not Currently   Drug use: No   Sexual activity: Not on file  Lifestyle   Physical activity    Days per week: Not on file    Minutes per session: Not on file   Stress: Not on file  Relationships   Social connections    Talks on phone: Not on file    Gets together: Not on file    Attends religious service: Not on file    Active member of club or organization: Not on file    Attends meetings of clubs or organizations: Not on file    Relationship status: Not on file  Other Topics Concern   Not on file  Social History Narrative   Patient was recently widowed in March 2020.   Patient has 1 daughter who lives in [place], [place]   Patient moved to [place] from [place] in January 2009.   Patient is a retired Therapist, sports.    Review of Systems: A 12 point ROS discussed and pertinent positives are indicated in the HPI above.  All other systems are negative.  Review of Systems  Constitutional: Negative for fatigue and fever.  HENT: Positive for sore throat and voice change.   Respiratory: Negative for cough and shortness of breath.   Cardiovascular: Negative for chest pain.  Neurological: Negative for weakness.  Psychiatric/Behavioral: Negative for behavioral problems and confusion.    Vital Signs: There were no vitals taken for this visit.  Physical Exam Vitals signs reviewed.  HENT:     Mouth/Throat:     Mouth: Mucous membranes are moist.  Cardiovascular:     Rate and Rhythm: Normal rate  and regular rhythm.     Heart sounds: Normal heart sounds.  Pulmonary:     Effort: Pulmonary effort is normal.     Breath sounds: Normal breath sounds.  Abdominal:     Palpations: Abdomen is soft.  Musculoskeletal: Normal range of motion.  Skin:    General: Skin is warm and dry.  Neurological:     Mental Status: She is alert and oriented to person, place, and time.  Psychiatric:        Mood and Affect: Mood normal.        Behavior: Behavior normal.  Thought Content: Thought content normal.        Judgment: Judgment normal.     Imaging: Ct Soft Tissue Neck W Contrast  Result Date: 04/11/2019 CLINICAL DATA:  Initial staging of glottic cancer EXAM: CT NECK WITH CONTRAST TECHNIQUE: Multidetector CT imaging of the neck was performed using the standard protocol following the bolus administration of intravenous contrast. CONTRAST:  10m OMNIPAQUE IOHEXOL 300 MG/ML  SOLN COMPARISON:  None. FINDINGS: Pharynx and larynx: Avidly enhancing mass centered at the left glottis which measures at least 15 mm and extends to but not clearly beyond the anterior commissure. There is growth into the left paraglottic fat without cartilage erosion. No sub site extension. Salivary glands: No inflammation, mass, or stone. Thyroid: Normal. Lymph nodes: None enlarged or abnormal density. Vascular: Atherosclerotic calcification without evident obstructive process. Limited intracranial: Negative Visualized orbits: Negative Mastoids and visualized paranasal sinuses: Retention cyst in the left maxillary sinus Skeleton: Upper and lower cervical facet spurring and midcervical spondylosis with ridging. Upper chest: Centrilobular emphysema. IMPRESSION: 1. Left glottic mass extending to but not beyond the anterior commissure. There is extension into the left paraglottic fat without cartilage erosion. 2. No adenopathy. 3. Aortic Atherosclerosis (ICD10-I70.0) and Emphysema (ICD10-J43.9). Electronically Signed   By: JMonte FantasiaM.D.   On: 04/11/2019 06:08   Nm Pet Image Initial (pi) Skull Base To Thigh  Result Date: 04/10/2019 CLINICAL DATA:  Initial treatment strategy for squamous cell carcinoma of the vocal cord. History of rheumatoid arthritis and osteoarthritis. EXAM: NUCLEAR MEDICINE PET SKULL BASE TO THIGH TECHNIQUE: 5.7 mCi F-18 FDG was injected intravenously. Full-ring PET imaging was performed from the skull base to thigh after the radiotracer. CT data was obtained and used for attenuation correction and anatomic localization. Fasting blood glucose: 92 mg/dl COMPARISON:  None FINDINGS: Mediastinal blood pool activity: SUV max 2.2 Liver activity: SUV max NA NECK: Left eccentric glottic mass, maximum SUV 8.9. Incidental CT findings: Mucous retention cyst in left maxillary sinus. Atherosclerotic calcification in the left common carotid artery. No hypermetabolic adenopathy in the neck is identified. CHEST: Diffuse subtle accentuated esophageal activity, maximum SUV 3.8 in the distal esophagus. Given the diffuse nature of this is probably physiologic. 1.0 cm AP window lymph node, maximum SUV 2.7. Two small left axillary lymph nodes are both hypermetabolic. The more anterior node on image 56/5 has a parenchymal thickness of 0.5 cm in short axis, maximum SUV 3.6. A lymph node with parenchymal thickness of 0.5 cm on the right has a maximum SUV of 3.2. Smaller subpectoral lymph nodes are identified. Incidental CT findings: Coronary, aortic arch, and branch vessel atherosclerotic vascular disease. Centrilobular emphysema. ABDOMEN/PELVIS: Diffuse gastric activity is likely physiologic, maximum SUV 4.5. Scattered physiologic activity in bowel. A left external iliac node measuring 0.7 cm in short axis on image 147/5 has maximum SUV of 4.4. Incidental CT findings: Hypodense hepatic lesions are observed, the larger posterior right hepatic lobe lesion visibly photopenic, favoring benign lesions. Mild right hydronephrosis without  hydroureter, query right UPJ narrowing. Prominent stool throughout the colon favors constipation. Aortoiliac atherosclerotic vascular disease. Sigmoid colon diverticulosis. Prominence of the left adnexa versus left eccentric uterus, but without hypermetabolic activity. SKELETON: Extensive hypermetabolic activity along many of the visualized large joints including the shoulders, hips, elbows, wrists, and along the left sternoclavicular joint. Aside from the sternoclavicular involvement this is mostly symmetric and probably from active Incidental CT findings: none IMPRESSION: 1. Left glottic lesion is hypermetabolic with maximum SUV 8.9, compatible with malignancy. No  discrete adenopathy in the neck. 2. Extensive periarticular activity especially around the large joints, likely related to rheumatoid arthritis. 3. Small but hypermetabolic axillary and left external iliac lymph nodes are most likely reactive to the patient's rheumatoid arthritis, but merit surveillance. 4. Mild right hydronephrosis without hydroureter, query right UPJ narrowing. Consider follow renal sonography. 5. Prominent but not hypermetabolic left adnexa, possibly due to prominent ovary or left eccentricity of the uterus, correlate with surgical history. This could be further investigated with pelvic sonography if clinically warranted. 6. Other imaging findings of potential clinical significance: Chronic left maxillary sinusitis. Aortic Atherosclerosis (ICD10-I70.0). Coronary atherosclerosis. Emphysema (ICD10-J43.9). Sigmoid colon diverticulosis. Electronically Signed   By: Van Clines M.D.   On: 04/10/2019 15:53    Labs:  CBC: Recent Labs    10/11/18 0921 01/15/19 0907 04/09/19 0826 04/18/19 0822  WBC 7.7 6.8 7.4 6.6  HGB 11.8 12.6 11.6* 11.7*  HCT 36.3 38.4 36.7 35.9*  PLT 417* 416* 428* 442*    COAGS: No results for input(s): INR, APTT in the last 8760 hours.  BMP: Recent Labs    10/11/18 0921 01/15/19 0907  04/09/19 0826 04/18/19 0822  NA 140 139 140 142  K 4.6 4.3 3.9 4.0  CL 107 105 106 106  CO2 _0 GLUCOSE 111* 73 99 66*  BUN _1 CALCIUM 9.4 9.6 9.5 9.5  CREATININE 0.73 0.77 0.75 0.73  GFRNONAA 83 78 >60 >60  GFRAA 97 91 >60 >60    LIVER FUNCTION TESTS: Recent Labs    10/11/18 0921 01/15/19 0907 04/09/19 0826 04/18/19 0822  BILITOT 0.3 0.3 0.3 0.2*  AST _2 ALT _3 ALKPHOS  --   --  96 106  PROT 6.6 7.2 6.8 7.2  ALBUMIN  --   --  4.2 3.8    TUMOR MARKERS: No results for input(s): AFPTM, CEA, CA199, CHROMGRNA in the last 8760 hours.  Assessment and Plan:  Head/neck cancer Vocal cord sq cell carcinoma Scheduled for chemo and radiation next week Here today for Port a cath placement and percutaneous gastric tube placement  Risks and benefits of image guided port-a-catheter placement was discussed with the patient including, but not limited to bleeding, infection, pneumothorax, or fibrin sheath development and need for additional procedures. All of the patient's questions were answered, patient is agreeable to proceed. Consent signed and in chart.  Risks and benefits image guided gastrostomy tube placement was discussed with the patient including, but not limited to the need for a barium enema during the procedure, bleeding, infection, peritonitis and/or damage to adjacent structures. All of the patient's questions were answered, patient is agreeable to proceed. Consent signed and in chart.   Thank you for this interesting consult.  I greatly enjoyed meeting GENOLA YUILLE and look forward to participating in their care.  A copy of this report was sent to the requesting provider on this date.  Electronically Signed: Lavonia Drafts, PA-C 04/26/2019, 11:02 AM   I spent a total of  30 Minutes   in face to face in clinical consultation, greater than 50% of which was counseling/coordinating care for [facility] and perc G tube placement

## 2019-04-27 ENCOUNTER — Telehealth: Payer: Self-pay | Admitting: Rheumatology

## 2019-04-27 ENCOUNTER — Ambulatory Visit: Payer: Medicare Other | Admitting: Physical Therapy

## 2019-04-27 DIAGNOSIS — Z510 Encounter for antineoplastic radiation therapy: Secondary | ICD-10-CM | POA: Diagnosis not present

## 2019-04-27 DIAGNOSIS — Z79899 Other long term (current) drug therapy: Secondary | ICD-10-CM | POA: Diagnosis not present

## 2019-04-27 DIAGNOSIS — C320 Malignant neoplasm of glottis: Secondary | ICD-10-CM | POA: Diagnosis not present

## 2019-04-27 NOTE — Telephone Encounter (Signed)
Sent message to Dr. Lanell Persons for approval (per Dr. Estanislado Pandy), awaiting response.

## 2019-04-27 NOTE — Telephone Encounter (Signed)
Patient called stating Dr. Isidore Moos told her it was okay for her to take Humira while undergoing radiation treatments.  Patient is requesting a return call to let her know that it is okay for her to reorder her prescription.

## 2019-04-30 ENCOUNTER — Telehealth: Payer: Self-pay | Admitting: *Deleted

## 2019-04-30 ENCOUNTER — Other Ambulatory Visit: Payer: Self-pay | Admitting: Hematology

## 2019-04-30 DIAGNOSIS — C320 Malignant neoplasm of glottis: Secondary | ICD-10-CM

## 2019-04-30 MED ORDER — MORPHINE SULFATE (CONCENTRATE) 10 MG /0.5 ML PO SOLN: 10.0000 mg | Freq: Four times a day (QID) | ORAL | 0 refills | Status: AC | PRN

## 2019-04-30 NOTE — Telephone Encounter (Signed)
-----   Message from Tish Men, MD sent at 04/30/2019  7:51 AM EST ----- Regarding: RE: Humira Yes, okay with Humira. It may not be as effective due to immunosuppression from chemotherapy, but there is no interaction between Humira and cisplatin.  Thanks.  GZ ----- Message ----- From: Eppie Gibson, MD Sent: 04/27/2019   3:33 PM EST To: Earnestine Mealing, CMA, Tish Men, MD Subject: RE: Humira                                     Yes, I am okay with Humira during RT.  Eulas Post, are you okay with Humira during chemotherapy? ----- Message ----- From: Earnestine Mealing, CMA Sent: 04/27/2019   2:39 PM EST To: Eppie Gibson, MD Subject: Humira                                         Good afternoon,   Can you provide clearance for patient to continue taking Humira during radiation treatments?   Thank you!  Kairon Shock  (Dr. Arlean Hopping medical assistant)

## 2019-04-30 NOTE — Telephone Encounter (Signed)
Oncology Nurse Navigator Documentation  Returned call to pt.  She reported she is experiencing sharp pain 8-9/10 at PEG insertion site (placement last Thursday, 11/5).  She is able to control pain with 800 mg Motrin and 200 mg Extra-strength Tylenol approx q6h which reduces pain to 2/10.  She requested alternative analgesic be sent to [facility] (noted preferred pharmacy in [place]).  She voiced understanding I will notify Dr. Maylon Peppers.  She further reported she is conducting daily PEG flushes and dsg changes without difficulty.  Note routed to Dr. Maylon Peppers.  [name], RN, BSN Head & Neck Oncology Nurse [place] at [place] [phone]

## 2019-04-30 NOTE — Telephone Encounter (Signed)
Pls let the patient know that I have sent a prescription for liquid IR morphine to the pharmacy.  Thanks.  Dr. Maylon Peppers

## 2019-04-30 NOTE — Telephone Encounter (Signed)
Oncology Nurse Navigator Documentation  Called Jean [name], informed Rx for liquid morphine sent by Dr. Maylon Peppers to preferred pharmacy.  I addressed her concern about taking narcotic pain medicine.   [name], RN, BSN Head & Neck Oncology Nurse [place] at [place] [phone]

## 2019-05-01 NOTE — Telephone Encounter (Signed)
Patient advised and will contact the office when she needs a refill.

## 2019-05-02 ENCOUNTER — Inpatient Hospital Stay (HOSPITAL_BASED_OUTPATIENT_CLINIC_OR_DEPARTMENT_OTHER): Payer: Medicare Other | Admitting: Hematology

## 2019-05-02 ENCOUNTER — Other Ambulatory Visit: Payer: Self-pay

## 2019-05-02 ENCOUNTER — Encounter: Payer: Self-pay | Admitting: Hematology

## 2019-05-02 ENCOUNTER — Inpatient Hospital Stay: Payer: Medicare Other

## 2019-05-02 VITALS — BP 139/69 | HR 92 | Temp 98.7°F | Resp 18 | Ht 63.0 in | Wt 116.7 lb

## 2019-05-02 DIAGNOSIS — Z95828 Presence of other vascular implants and grafts: Secondary | ICD-10-CM | POA: Insufficient documentation

## 2019-05-02 DIAGNOSIS — C320 Malignant neoplasm of glottis: Secondary | ICD-10-CM

## 2019-05-02 DIAGNOSIS — D473 Essential (hemorrhagic) thrombocythemia: Secondary | ICD-10-CM

## 2019-05-02 DIAGNOSIS — T85848A Pain due to other internal prosthetic devices, implants and grafts, initial encounter: Secondary | ICD-10-CM | POA: Diagnosis not present

## 2019-05-02 DIAGNOSIS — D75839 Thrombocytosis, unspecified: Secondary | ICD-10-CM

## 2019-05-02 DIAGNOSIS — Z5111 Encounter for antineoplastic chemotherapy: Secondary | ICD-10-CM | POA: Diagnosis not present

## 2019-05-02 DIAGNOSIS — D539 Nutritional anemia, unspecified: Secondary | ICD-10-CM | POA: Diagnosis not present

## 2019-05-02 DIAGNOSIS — E876 Hypokalemia: Secondary | ICD-10-CM | POA: Diagnosis not present

## 2019-05-02 LAB — CBC WITH DIFFERENTIAL (CANCER CENTER ONLY)
Abs Immature Granulocytes: 0.03 10*3/uL (ref 0.00–0.07)
Basophils Absolute: 0 10*3/uL (ref 0.0–0.1)
Basophils Relative: 0 %
Eosinophils Absolute: 0.1 10*3/uL (ref 0.0–0.5)
Eosinophils Relative: 1 %
HCT: 31.3 % — ABNORMAL LOW (ref 36.0–46.0)
Hemoglobin: 10 g/dL — ABNORMAL LOW (ref 12.0–15.0)
Immature Granulocytes: 0 %
Lymphocytes Relative: 23 %
Lymphs Abs: 1.9 10*3/uL (ref 0.7–4.0)
MCH: 31.7 pg (ref 26.0–34.0)
MCHC: 31.9 g/dL (ref 30.0–36.0)
MCV: 99.4 fL (ref 80.0–100.0)
Monocytes Absolute: 0.6 10*3/uL (ref 0.1–1.0)
Monocytes Relative: 7 %
Neutro Abs: 5.4 10*3/uL (ref 1.7–7.7)
Neutrophils Relative %: 69 %
Platelet Count: 409 10*3/uL — ABNORMAL HIGH (ref 150–400)
RBC: 3.15 MIL/uL — ABNORMAL LOW (ref 3.87–5.11)
RDW: 13.5 % (ref 11.5–15.5)
WBC Count: 7.9 10*3/uL (ref 4.0–10.5)
nRBC: 0 % (ref 0.0–0.2)

## 2019-05-02 LAB — BASIC METABOLIC PANEL - CANCER CENTER ONLY
Anion gap: 9 (ref 5–15)
BUN: 12 mg/dL (ref 8–23)
CO2: 26 mmol/L (ref 22–32)
Calcium: 9.1 mg/dL (ref 8.9–10.3)
Chloride: 103 mmol/L (ref 98–111)
Creatinine: 0.73 mg/dL (ref 0.44–1.00)
GFR, Est AFR Am: >60 mL/min (ref >60)
GFR, Estimated: >60 mL/min (ref >60)
Glucose, Bld: 87 mg/dL (ref 70–99)
Potassium: 4.1 mmol/L (ref 3.5–5.1)
Sodium: 138 mmol/L (ref 135–145)

## 2019-05-02 LAB — MAGNESIUM: Magnesium: 1.9 mg/dL (ref 1.7–2.4)

## 2019-05-02 MED ORDER — HYDROCODONE-ACETAMINOPHEN 10-325 MG PO TABS: 1.0000 | ORAL_TABLET | Freq: Four times a day (QID) | ORAL | 0 refills | Status: DC | PRN

## 2019-05-02 MED ORDER — SODIUM CHLORIDE 0.9% FLUSH
10.0000 mL | INTRAVENOUS | Status: DC | PRN
  Administered 2019-05-02: 10 mL
  Filled 2019-05-02: qty 10

## 2019-05-02 MED FILL — HYDROCODON-APAP 10-325: 10-325 | 7 days supply | Qty: 30 | Fill #0

## 2019-05-02 NOTE — Progress Notes (Signed)
[name] OFF[facility] PROGRESS NOTE  Patient Care Team: Charolette Forward, MD as PCP - General (Cardiology) Wyatt Portela, MD as Consulting Physician (Oncology) Charolette Forward, MD as Referring Physician (Cardiology) Eppie Gibson, MD as Attending Physician (Radiation Oncology) Leota Sauers, RN as Oncology Nurse Navigator  HEME/ONC OVERVIEW: 1. 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 Port and PEG in 04/2019   TREATMENT SUMMARY:  05/02/2019 - present: definitive chemoRT with weekly cisplatin   PERTINENT NON-HEM/ONC PROBLEMS: 1. 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 -I reinforced the rationale for chemoradiation, as well as some of the potential toxicities -Labs adequate, proceed with Cycle 1 of weekly cisplatin -Plan for 7 doses -PRN anti-emetics: Zofran, Compazine, dexamethasone, and Ativan  Macrocytic anemia -Likely due to methotrexate -Hgb 10.0 today, lower than the last visit -Clinically, patient denies any symptoms of bleeding -Iron and folate levels normal; B12 level very low  -On OTC B12 supplement daily; periodic B12 level monitoring  -She will need age-appropriate cancer screening, including colonoscopy and MMG, after completing chemoradiation   Thrombocytosis -Likely secondary to rheumatoid arthritis -Plts 409k today, stable -We will monitor it for now   Pain around the PEG site -Patient was prescribed IR liquid morphine PRN for pain, but she has not received her prescription due to insurance  approval -She has been taking Extra Strength Tylenol and ibuprofen up to 886m several times a day -I have prescribed Norco 10/325, q6-8hrs PRN, while awaiting the IR morphine approval -I also instructed the patient to stop taking OTC Tylenol and NSAIDs    Rheumatoid arthritis -Methotrexate discontinued on 04/10/2019, and plan to continue Humira during treatment -Continue follow-up with rheumatology   No orders of the defined types were placed in this encounter.  All questions were answered. The patient knows to call the clinic with any problems, questions or concerns. No barriers to learning was detected.  Return in 1 week for labs, port flush, clinic appointment, Cycle 2 weekly cisplatin.  YTish Men MD 05/02/2019 2:05 PM  CHIEF COMPLAINT: "I am having a lot of pain around the feeding tube site"  INTERVAL HISTORY: Ms. SPitzreturns clinic for follow-up of squamous cell carcinoma of the left vocal cord on definitive chemoradiation.  Patient had her port and feeding tube placed earlier this month, but despite having IR morphine prescribed several days prior to the procedure, she was unable to pick up her pain medication due to delay in insurance approval.  As a result, she has been taking extra strength Tylenol and ibuprofen up to 800 mg several times a day with only modest pain relief.  The pain is localized around the feeding tube site, exacerbated by cough.  She denies any nausea, vomiting, diarrhea, hematochezia, or melena.  Her rheumatologist stopped her methotrexate on 04/10/2019, and she will remain on Humira.  She is taking oral B12 supplement.    REVIEW OF SYSTEMS:   Constitutional: ( - ) fevers, ( - )  chills , ( - ) night sweats Eyes: ( - ) blurriness of vision, ( - )  double vision, ( - ) watery eyes Ears, nose, mouth, throat, and face: ( - ) mucositis, ( - ) sore throat Respiratory: ( - ) cough, ( - ) dyspnea, ( - ) wheezes Cardiovascular: ( - ) palpitation, ( - ) chest  discomfort, ( - ) lower extremity swelling Gastrointestinal:  ( - ) nausea, ( - ) heartburn, ( - ) change in bowel habits Skin: ( - ) abnormal skin rashes Lymphatics: ( - ) new lymphadenopathy, ( - ) easy bruising Neurological: ( - ) numbness, ( - ) tingling, ( - ) new weaknesses Behavioral/Psych: ( - ) mood change, ( - ) new changes  All other systems were reviewed with the patient and are negative.  SUMMARY OF ONCOLOGIC HISTORY: Oncology History  Squamous cell carcinoma of left vocal cord ([place])  03/26/2019 Pathology Results   DIAGNOSIS:   A. VOCAL CORD MASS, LEFT, EXCISION:  - Squamous cell carcinoma.  - See comment.    04/06/2019 Initial Diagnosis   Squamous cell carcinoma of left vocal cord ([place])   04/10/2019 Imaging   PET: IMPRESSION: 1. Left glottic lesion is hypermetabolic with maximum SUV 8.9, compatible with malignancy. No discrete adenopathy in the neck. 2. Extensive periarticular activity especially around the large joints, likely related to rheumatoid arthritis. 3. Small but hypermetabolic axillary and left external iliac lymph nodes are most likely reactive to the patient's rheumatoid arthritis, but merit surveillance. 4. Mild right hydronephrosis without hydroureter, query right UPJ narrowing. Consider follow renal sonography. 5. Prominent but not hypermetabolic left adnexa, possibly due to prominent ovary or left eccentricity of the uterus, correlate with surgical history. This could be further investigated with pelvic sonography if clinically warranted. 6. Other imaging findings of potential clinical significance: Chronic left maxillary sinusitis. Aortic Atherosclerosis (ICD10-I70.0). Coronary atherosclerosis. Emphysema (ICD10-J43.9). Sigmoid colon diverticulosis.   04/10/2019 Imaging   CT neck w/ contrast: IMPRESSION: 1. Left glottic mass extending to but not beyond the anterior commissure. There is extension into the left paraglottic fat  without cartilage erosion. 2. No adenopathy. 3. Aortic Atherosclerosis (ICD10-I70.0) and Emphysema (ICD10-J43.9).   04/11/2019 Cancer Staging   Staging form: Larynx - Glottis, AJCC 8th Edition - Clinical stage from 04/11/2019: Stage III (cT3, cN0, cM0) - Signed by Eppie Gibson, MD on 04/11/2019   05/03/2019 -  Chemotherapy   The patient had palonosetron (ALOXI) injection 0.25 mg, 0.25 mg, Intravenous,  Once, 0 of 7 cycles CISplatin (PLATINOL) 62 mg in sodium chloride 0.9 % 250 mL chemo infusion, 40 mg/m2 = 62 mg, Intravenous,  Once, 0 of 7 cycles fosaprepitant (EMEND) 150 mg, dexamethasone (DECADRON) 12 mg in sodium chloride 0.9 % 145 mL IVPB, , Intravenous,  Once, 0 of 7 cycles  for chemotherapy treatment.      I have reviewed the past medical history, past surgical history, social history and family history with the patient and they are unchanged from previous note.  ALLERGIES:  is allergic to sulfa antibiotics.  MEDICATIONS:  Current Outpatient Medications  Medication Sig Dispense Refill  . Adalimumab (HUMIRA PEN) 40 MG/0.4ML PNKT Inject 40 mg into the skin every 14 (fourteen) days. (Patient taking differently: Inject 40 mg into the skin every 21 ( twenty-one) days. ) 3 each 0  . ALLERGIST TRAY 1CC 27GX1/2" 27G X 1/2" 1 ML KIT TO BE USED WITH WEEKLY METHOTREXATE INJECTIONS ONCE A WEEK 25 each 0  . amLODipine (NORVASC) 5 MG tablet Take 5 mg by mouth daily.  0  . Cholecalciferol (VITAMIN D) 50  MCG (2000 UT) CAPS Take 2,000 Units by mouth daily.     . folic acid (FOLVITE) 1 MG tablet TAKE 2 TABLETS(2 MG) BY MOUTH DAILY (Patient taking differently: Take 2 mg by mouth daily. ) 180 tablet 3  . losartan (COZAAR) 100 MG tablet Take 100 mg by mouth daily.   0  . methotrexate 50 MG/2ML injection INJECT 0.6 ML(15 MG TOTAL) UNDER THE SKIN ONCE A WEEK 8 mL 0  . Morphine Sulfate (MORPHINE CONCENTRATE) 10 mg / 0.5 ml concentrated solution Take 0.5 mLs (10 mg total) by mouth every 6 (six) hours as  needed for severe pain. 120 mL 0  . ondansetron (ZOFRAN) 8 MG tablet Take 1 tablet (8 mg total) by mouth 2 (two) times daily as needed. Start on the third day after chemotherapy. 30 tablet 1  . sodium fluoride (PREVIDENT 5000 PLUS) 1.1 % CREA dental cream Apply to tooth brush. Brush teeth for 2 minutes. Spit out excess-DO NOT swallow. DO NOT rinse afterwards. Repeat nightly. 1 Tube prn  . vitamin B-12 (CYANOCOBALAMIN) 1000 MCG tablet Take 1,000 mcg by mouth daily.    Marland Kitchen dexamethasone (DECADRON) 4 MG tablet Take 2 tablets by mouth once a day on the day after chemotherapy and then take 2 tablets two times a day for 2 days. Take with food. (Patient not taking: Reported on 05/02/2019) 30 tablet 1  . lidocaine-prilocaine (EMLA) cream Apply to affected area once (Patient not taking: Reported on 05/02/2019) 30 g 3  . LORazepam (ATIVAN) 0.5 MG tablet Take 1 tablet (0.5 mg total) by mouth every 6 (six) hours as needed (Nausea or vomiting). (Patient not taking: Reported on 05/02/2019) 30 tablet 0  . ondansetron (ZOFRAN) 4 MG tablet Take 1 tablet (4 mg total) by mouth every 6 (six) hours as needed for nausea or vomiting. (Patient not taking: Reported on 05/02/2019) 30 tablet 2  . prochlorperazine (COMPAZINE) 10 MG tablet Take 1 tablet (10 mg total) by mouth every 6 (six) hours as needed (Nausea or vomiting). (Patient not taking: Reported on 05/02/2019) 30 tablet 1   No current facility-administered medications for this visit.     PHYSICAL EXAMINATION: ECOG PERFORMANCE STATUS: 1 - Symptomatic but completely ambulatory he does stop radiation from getting to make  Today's Vitals   05/02/19 1358  BP: 139/69  Pulse: 92  Resp: 18  Temp: 98.7 F (37.1 C)  TempSrc: Temporal  SpO2: 100%  Weight: 116 lb 11.2 oz (52.9 kg)  Height: _0  (1.6 m)   Body mass index is 20.67 kg/m.  Filed Weights   05/02/19 1358  Weight: 116 lb 11.2 oz (52.9 kg)    GENERAL: alert, no distress and comfortable SKIN: skin  color, texture, turgor are normal, no rashes or significant lesions EYES: conjunctiva are pink and non-injected, sclera clear OROPHARYNX: no exudate, no erythema; lips, buccal mucosa, and tongue normal  NECK: supple, non-tender LYMPH:  no palpable lymphadenopathy in the cervical LUNGS: clear to auscultation with normal breathing effort HEART: regular rate & rhythm and no murmurs and no lower extremity edema ABDOMEN: soft, non-tender, non-distended, normal bowel sounds Musculoskeletal: no cyanosis of digits and no clubbing  PSYCH: alert & oriented x 3, fluent speech is like watching that the back in the cold water when it may soon able for nuclear desk is going to stop the nuclear glass  LABORATORY DATA:  I have reviewed the data as listed    Component Value Date/Time   NA 139 04/26/2019 1115  NA 141 03/05/2014 1050   K 4.0 04/26/2019 1115   K 3.9 03/05/2014 1050   CL 108 04/26/2019 1115   CO2 22 04/26/2019 1115   CO2 24 03/05/2014 1050   GLUCOSE 87 04/26/2019 1115   GLUCOSE 99 03/05/2014 1050   BUN 10 04/26/2019 1115   BUN 10.6 03/05/2014 1050   CREATININE 0.78 04/26/2019 1115   CREATININE 0.73 04/18/2019 0822   CREATININE 0.77 01/15/2019 0907   CREATININE 0.8 03/05/2014 1050   CALCIUM 9.4 04/26/2019 1115   CALCIUM 8.8 03/05/2014 1050   PROT 7.2 04/18/2019 0822   PROT 6.5 03/05/2014 1050   ALBUMIN 3.8 04/18/2019 0822   ALBUMIN 3.2 (L) 03/05/2014 1050   AST 19 04/18/2019 0822   AST 12 03/05/2014 1050   ALT 22 04/18/2019 0822   ALT 28 03/05/2014 1050   ALKPHOS 106 04/18/2019 0822   ALKPHOS 85 03/05/2014 1050   BILITOT 0.2 (L) 04/18/2019 0822   BILITOT 0.20 03/05/2014 1050   GFRNONAA >60 04/26/2019 1115   GFRNONAA >60 04/18/2019 0822   GFRNONAA 78 01/15/2019 0907   GFRAA >60 04/26/2019 1115   GFRAA >60 04/18/2019 0822   GFRAA 91 01/15/2019 0907    No results found for: SPEP, UPEP  Lab Results  Component Value Date   WBC 7.9 05/02/2019   NEUTROABS 5.4 05/02/2019    HGB 10.0 (L) 05/02/2019   HCT 31.3 (L) 05/02/2019   MCV 99.4 05/02/2019   PLT 409 (H) 05/02/2019      Chemistry      Component Value Date/Time   NA 139 04/26/2019 1115   NA 141 03/05/2014 1050   K 4.0 04/26/2019 1115   K 3.9 03/05/2014 1050   CL 108 04/26/2019 1115   CO2 22 04/26/2019 1115   CO2 24 03/05/2014 1050   BUN 10 04/26/2019 1115   BUN 10.6 03/05/2014 1050   CREATININE 0.78 04/26/2019 1115   CREATININE 0.73 04/18/2019 0822   CREATININE 0.77 01/15/2019 0907   CREATININE 0.8 03/05/2014 1050      Component Value Date/Time   CALCIUM 9.4 04/26/2019 1115   CALCIUM 8.8 03/05/2014 1050   ALKPHOS 106 04/18/2019 0822   ALKPHOS 85 03/05/2014 1050   AST 19 04/18/2019 0822   AST 12 03/05/2014 1050   ALT 22 04/18/2019 0822   ALT 28 03/05/2014 1050   BILITOT 0.2 (L) 04/18/2019 0822   BILITOT 0.20 03/05/2014 1050       RADIOGRAPHIC STUDIES: I have personally reviewed the radiological images as listed below and agreed with the findings in the report. Ct Soft Tissue Neck W Contrast  Result Date: 04/11/2019 CLINICAL DATA:  Initial staging of glottic cancer EXAM: CT NECK WITH CONTRAST TECHNIQUE: Multidetector CT imaging of the neck was performed using the standard protocol following the bolus administration of intravenous contrast. CONTRAST:  9m OMNIPAQUE IOHEXOL 300 MG/ML  SOLN COMPARISON:  None. FINDINGS: Pharynx and larynx: Avidly enhancing mass centered at the left glottis which measures at least 15 mm and extends to but not clearly beyond the anterior commissure. There is growth into the left paraglottic fat without cartilage erosion. No sub site extension. Salivary glands: No inflammation, mass, or stone. Thyroid: Normal. Lymph nodes: None enlarged or abnormal density. Vascular: Atherosclerotic calcification without evident obstructive process. Limited intracranial: Negative Visualized orbits: Negative Mastoids and visualized paranasal sinuses: Retention cyst in the left  maxillary sinus Skeleton: Upper and lower cervical facet spurring and midcervical spondylosis with ridging. Upper chest: Centrilobular emphysema. IMPRESSION: 1. Left  glottic mass extending to but not beyond the anterior commissure. There is extension into the left paraglottic fat without cartilage erosion. 2. No adenopathy. 3. Aortic Atherosclerosis (ICD10-I70.0) and Emphysema (ICD10-J43.9). Electronically Signed   By: Monte Fantasia M.D.   On: 04/11/2019 06:08   Ir Gastrostomy Tube Mod Sed  Result Date: 04/26/2019 INDICATION: 73 year old with squamous cell carcinoma of the left vocal cord. Patient is scheduled for chemotherapy and radiation. EXAM: PERCUTANEOUS GASTROSTOMY TUBE WITH FLUOROSCOPIC GUIDANCE Physician: Stephan Minister. Anselm Pancoast, MD MEDICATIONS: Antibiotics were given prior to the Port-A-Cath which was performed immediately prior to this procedure. Glucagon 1 mg IV ANESTHESIA/SEDATION: Versed 1 mg IV; Fentanyl 37.5 mcg IV Moderate Sedation Time:  14 minutes The patient was continuously monitored during the procedure by the interventional radiology nurse under my direct supervision. FLUOROSCOPY TIME:  Fluoroscopy Time: 2 minutes 54 seconds (7 mGy). COMPLICATIONS: None immediate. PROCEDURE: The procedure was explained to the patient. The risks and benefits of the procedure were discussed and the patient's questions were addressed. Informed consent was obtained from the patient. The patient was placed on the interventional table. Fluoroscopy demonstrated oral contrast in the transverse colon. An orogastric tube was placed with fluoroscopic guidance. The anterior abdomen was prepped and draped in sterile fashion. Maximal barrier sterile technique was utilized including caps, mask, sterile gowns, sterile gloves, sterile drape, hand hygiene and skin antiseptic. Stomach was inflated with air through the orogastric tube. The skin and subcutaneous tissues were anesthetized with 1% lidocaine. A 17 gauge needle was  directed into the distended stomach with fluoroscopic guidance. A wire was advanced into the stomach and a T-tact was deployed. A 9-French vascular sheath was placed and the orogastric tube was snared using a Gooseneck snare device. The orogastric tube and snare were pulled out of the patient's mouth. The snare device was connected to a 20-French gastrostomy tube. The snare device and gastrostomy tube were pulled through the patient's mouth and out the anterior abdominal wall. The gastrostomy tube was cut to an appropriate length. Contrast injection through gastrostomy tube confirmed placement within the stomach. Fluoroscopic images were obtained for documentation. The gastrostomy tube was flushed with normal saline. IMPRESSION: Successful fluoroscopic guided percutaneous gastrostomy tube placement. Electronically Signed   By: Markus Daft M.D.   On: 04/26/2019 15:51   Nm Pet Image Initial (pi) Skull Base To Thigh  Result Date: 04/10/2019 CLINICAL DATA:  Initial treatment strategy for squamous cell carcinoma of the vocal cord. History of rheumatoid arthritis and osteoarthritis. EXAM: NUCLEAR MEDICINE PET SKULL BASE TO THIGH TECHNIQUE: 5.7 mCi F-18 FDG was injected intravenously. Full-ring PET imaging was performed from the skull base to thigh after the radiotracer. CT data was obtained and used for attenuation correction and anatomic localization. Fasting blood glucose: 92 mg/dl COMPARISON:  None FINDINGS: Mediastinal blood pool activity: SUV max 2.2 Liver activity: SUV max NA NECK: Left eccentric glottic mass, maximum SUV 8.9. Incidental CT findings: Mucous retention cyst in left maxillary sinus. Atherosclerotic calcification in the left common carotid artery. No hypermetabolic adenopathy in the neck is identified. CHEST: Diffuse subtle accentuated esophageal activity, maximum SUV 3.8 in the distal esophagus. Given the diffuse nature of this is probably physiologic. 1.0 cm AP window lymph node, maximum SUV 2.7.  Two small left axillary lymph nodes are both hypermetabolic. The more anterior node on image 56/5 has a parenchymal thickness of 0.5 cm in short axis, maximum SUV 3.6. A lymph node with parenchymal thickness of 0.5 cm on the right has a  maximum SUV of 3.2. Smaller subpectoral lymph nodes are identified. Incidental CT findings: Coronary, aortic arch, and branch vessel atherosclerotic vascular disease. Centrilobular emphysema. ABDOMEN/PELVIS: Diffuse gastric activity is likely physiologic, maximum SUV 4.5. Scattered physiologic activity in bowel. A left external iliac node measuring 0.7 cm in short axis on image 147/5 has maximum SUV of 4.4. Incidental CT findings: Hypodense hepatic lesions are observed, the larger posterior right hepatic lobe lesion visibly photopenic, favoring benign lesions. Mild right hydronephrosis without hydroureter, query right UPJ narrowing. Prominent stool throughout the colon favors constipation. Aortoiliac atherosclerotic vascular disease. Sigmoid colon diverticulosis. Prominence of the left adnexa versus left eccentric uterus, but without hypermetabolic activity. SKELETON: Extensive hypermetabolic activity along many of the visualized large joints including the shoulders, hips, elbows, wrists, and along the left sternoclavicular joint. Aside from the sternoclavicular involvement this is mostly symmetric and probably from active Incidental CT findings: none IMPRESSION: 1. Left glottic lesion is hypermetabolic with maximum SUV 8.9, compatible with malignancy. No discrete adenopathy in the neck. 2. Extensive periarticular activity especially around the large joints, likely related to rheumatoid arthritis. 3. Small but hypermetabolic axillary and left external iliac lymph nodes are most likely reactive to the patient's rheumatoid arthritis, but merit surveillance. 4. Mild right hydronephrosis without hydroureter, query right UPJ narrowing. Consider follow renal sonography. 5. Prominent but  not hypermetabolic left adnexa, possibly due to prominent ovary or left eccentricity of the uterus, correlate with surgical history. This could be further investigated with pelvic sonography if clinically warranted. 6. Other imaging findings of potential clinical significance: Chronic left maxillary sinusitis. Aortic Atherosclerosis (ICD10-I70.0). Coronary atherosclerosis. Emphysema (ICD10-J43.9). Sigmoid colon diverticulosis. Electronically Signed   By: Van Clines M.D.   On: 04/10/2019 15:53   Ir Imaging Guided Port Insertion  Result Date: 04/26/2019 INDICATION: 73 year old with squamous cell carcinoma of the left focal cord. Port-A-Cath needed for chemotherapy. EXAM: FLUOROSCOPIC AND ULTRASOUND GUIDED PLACEMENT OF A SUBCUTANEOUS PORT COMPARISON:  None. MEDICATIONS: Ancef 2 g; The antibiotic was administered within an appropriate time interval prior to skin puncture. ANESTHESIA/SEDATION: Versed 1.5 mg IV; Fentanyl 62.5 mcg IV; Moderate Sedation Time:  36 The patient was continuously monitored during the procedure by the interventional radiology nurse under my direct supervision. FLUOROSCOPY TIME:  42 seconds, 1 mGy COMPLICATIONS: None immediate. PROCEDURE: The procedure, risks, benefits, and alternatives were explained to the patient. Questions regarding the procedure were encouraged and answered. The patient understands and consents to the procedure. Patient was placed supine on the interventional table. Ultrasound confirmed a patent right internal jugular vein. Ultrasound image was saved for documentation. The right chest and neck were cleaned with a skin antiseptic and a sterile drape was placed. Maximal barrier sterile technique was utilized including caps, mask, sterile gowns, sterile gloves, sterile drape, hand hygiene and skin antiseptic. The right neck was anesthetized with 1% lidocaine. Small incision was made in the right neck with a blade. Micropuncture set was placed in the right internal  jugular vein with ultrasound guidance. The micropuncture wire was used for measurement purposes. The right chest was anesthetized with 1% lidocaine with epinephrine. #15 blade was used to make an incision and a subcutaneous port pocket was formed. Vian was assembled. Subcutaneous tunnel was formed with a stiff tunneling device. The port catheter was brought through the subcutaneous tunnel. The port was placed in the subcutaneous pocket. The micropuncture set was exchanged for a peel-away sheath. The catheter was placed through the peel-away sheath and the tip was placed in  the central venous system. However, the catheter was felt to be too long. Catheter was pulled back and unfortunately the venous access was lost. Therefore, the right internal jugular vein was punctured a second time with ultrasound guidance and micropuncture dilator set was again placed. Catheter was tunneled through the subcutaneous tissues. Catheter was cut to an appropriate length. Catheter was advanced through the peel-away sheath and positioned in the lower SVC. Catheter placement was confirmed with fluoroscopy. The port was accessed and flushed with heparinized saline. The port pocket was closed using two layers of absorbable sutures and Dermabond. The vein skin site was closed using a single layer of absorbable suture and Dermabond. Sterile dressings were applied. Patient tolerated the procedure well without an immediate complication. Ultrasound and fluoroscopic images were taken and saved for this procedure. IMPRESSION: Placement of a subcutaneous port device. Catheter tip in the lower SVC. Electronically Signed   By: Markus Daft M.D.   On: 04/26/2019 15:43

## 2019-05-02 NOTE — Progress Notes (Signed)
Met w/ pt to introduce myself as her Arboriculturist.  Unfortunately there aren't any foundations offering copay assistance for her Dx and the type of ins she has.  I informed her of the [place], went over what it covers, gave her the income requirement and an expense sheet.  Pt didn't say if she wanted to apply or not so I gave her my card if she would like to apply in the future.

## 2019-05-03 ENCOUNTER — Other Ambulatory Visit: Payer: Self-pay

## 2019-05-03 ENCOUNTER — Inpatient Hospital Stay: Payer: Medicare Other

## 2019-05-03 ENCOUNTER — Encounter: Payer: Self-pay | Admitting: *Deleted

## 2019-05-03 ENCOUNTER — Ambulatory Visit
Admission: RE | Admit: 2019-05-03 | Discharge: 2019-05-03 | Disposition: A | Discharge status: Home / Self Care | Payer: Medicare Other | Source: Ambulatory Visit | Attending: Radiation Oncology | Admitting: Radiation Oncology

## 2019-05-03 ENCOUNTER — Inpatient Hospital Stay: Payer: Medicare Other | Admitting: Nutrition

## 2019-05-03 VITALS — BP 109/59 | HR 76 | Temp 98.5°F | Resp 16

## 2019-05-03 DIAGNOSIS — Z79899 Other long term (current) drug therapy: Secondary | ICD-10-CM | POA: Diagnosis not present

## 2019-05-03 DIAGNOSIS — E876 Hypokalemia: Secondary | ICD-10-CM | POA: Diagnosis not present

## 2019-05-03 DIAGNOSIS — C320 Malignant neoplasm of glottis: Secondary | ICD-10-CM

## 2019-05-03 DIAGNOSIS — Z5111 Encounter for antineoplastic chemotherapy: Secondary | ICD-10-CM | POA: Diagnosis not present

## 2019-05-03 DIAGNOSIS — Z510 Encounter for antineoplastic radiation therapy: Secondary | ICD-10-CM | POA: Diagnosis not present

## 2019-05-03 MED ORDER — SODIUM CHLORIDE 0.9 % IV SOLN
40.0000 mg/m2 | Freq: Once | INTRAVENOUS | Status: AC
  Administered 2019-05-03: 12:00:00 62 mg via INTRAVENOUS
  Filled 2019-05-03: qty 62

## 2019-05-03 MED ORDER — SODIUM CHLORIDE 0.9 % IV SOLN
Freq: Once | INTRAVENOUS | Status: AC
  Administered 2019-05-03: 09:00:00 via INTRAVENOUS
  Filled 2019-05-03: qty 250

## 2019-05-03 MED ORDER — HEPARIN SOD (PORK) LOCK FLUSH 100 UNIT/ML IV SOLN
500.0000 [IU] | Freq: Once | INTRAVENOUS | Status: AC | PRN
  Administered 2019-05-03: 15:00:00 500 [IU]
  Filled 2019-05-03: qty 5

## 2019-05-03 MED ORDER — SODIUM CHLORIDE 0.9% FLUSH
10.0000 mL | INTRAVENOUS | Status: DC | PRN
  Administered 2019-05-03: 10 mL
  Filled 2019-05-03: qty 10

## 2019-05-03 MED ORDER — SODIUM CHLORIDE 0.9 % IV SOLN
Freq: Once | INTRAVENOUS | Status: AC
  Administered 2019-05-03: 11:00:00 via INTRAVENOUS
  Filled 2019-05-03: qty 5

## 2019-05-03 MED ORDER — PALONOSETRON HCL INJECTION 0.25 MG/5ML
INTRAVENOUS | Status: AC
  Filled 2019-05-03: qty 5

## 2019-05-03 MED ORDER — PALONOSETRON HCL INJECTION 0.25 MG/5ML
0.2500 mg | Freq: Once | INTRAVENOUS | Status: AC
  Administered 2019-05-03: 0.25 mg via INTRAVENOUS

## 2019-05-03 MED ORDER — POTASSIUM CHLORIDE 2 MEQ/ML IV SOLN
Freq: Once | INTRAVENOUS | Status: AC
  Administered 2019-05-03: 09:00:00 via INTRAVENOUS
  Filled 2019-05-03: qty 10

## 2019-05-03 NOTE — Progress Notes (Signed)
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  She is a patient of Dr. Isidore Moos and Dr. Maylon Peppers.  She is receiving concurrent chemoradiation treatments.  Past medical history includes rheumatoid arthritis and hypertension.  Medications include vitamin D, Decadron, Folvite, Ativan, Zofran, Compazine, and vitamin B12.  Labs were reviewed.  Height: 5 feet 3 inches. Weight: 116.7 pounds November 11. Usual body weight: 122 pounds in December 2019. BMI: 20.67.  Patient is status post port and PEG placement on November 5.  She reports increased pain around her PEG site. She is currently eating without difficulty. She does not like milk and reports she is lactose intolerant. Patient has lost 4% of her body weight over 11 months which is not significant.  Nutrition diagnosis: Predicted suboptimal energy intake related to glottis cancer and associated treatments as evidenced by no prior need for nutrition related information.  Intervention: Patient was educated to consume higher calorie, higher protein foods to minimize weight loss throughout treatment. Educated her on how weight loss can affect immune function and muscle loss. Recommended she try to infuse 1 bottle of Osmolite 1.5 daily. Teach back method used. Patient was provided with handouts on increasing calories and protein and soft protein foods.  She was also given my contact information.  Monitoring, evaluation, goals: Patient will tolerate increased calories and protein to minimize weight loss.  Next visit: Thursday, November 19 during infusion.  **Disclaimer: This note was dictated with voice recognition software. Similar sounding words can inadvertently be transcribed and this note may contain transcription errors which may not have been corrected upon publication of note.**

## 2019-05-03 NOTE — Patient Instructions (Signed)
[facility] Discharge Instructions for Patients Receiving Chemotherapy  Today you received the following chemotherapy agents: Cisplatin  To help prevent nausea and vomiting after your treatment, we encourage you to take your nausea medication as directed by your MD.   If you develop nausea and vomiting that is not controlled by your nausea medication, call the clinic.   BELOW ARE SYMPTOMS THAT SHOULD BE REPORTED IMMEDIATELY:  *FEVER GREATER THAN 100.5 F  *CHILLS WITH OR WITHOUT FEVER  NAUSEA AND VOMITING THAT IS NOT CONTROLLED WITH YOUR NAUSEA MEDICATION  *UNUSUAL SHORTNESS OF BREATH  *UNUSUAL BRUISING OR BLEEDING  TENDERNESS IN MOUTH AND THROAT WITH OR WITHOUT PRESENCE OF ULCERS  *URINARY PROBLEMS  *BOWEL PROBLEMS  UNUSUAL RASH Items with * indicate a potential emergency and should be followed up as soon as possible.  Feel free to call the clinic should you have any questions or concerns. The clinic phone number is [phone].  Please show the [facility] at check-in to the Emergency Department and triage nurse. Cisplatin injection What is this medicine? CISPLATIN (SIS pla tin) is a chemotherapy drug. It targets fast dividing cells, like cancer cells, and causes these cells to die. This medicine is used to treat many types of cancer like bladder, ovarian, and testicular cancers. This medicine may be used for other purposes; ask your health care provider or pharmacist if you have questions. COMMON BRAND NAME(S): Platinol, Platinol -AQ What should I tell my health care provider before I take this medicine? They need to know if you have any of these conditions:  blood disorders  hearing problems  kidney disease  recent or ongoing radiation therapy  an unusual or allergic reaction to cisplatin, carboplatin, other chemotherapy, other medicines, foods, dyes, or preservatives  pregnant or trying to get pregnant  breast-feeding How should I use  this medicine? This drug is given as an infusion into a vein. It is administered in a hospital or clinic by a specially trained health care professional. Talk to your pediatrician regarding the use of this medicine in children. Special care may be needed. Overdosage: If you think you have taken too much of this medicine contact a poison control center or emergency room at once. NOTE: This medicine is only for you. Do not share this medicine with others. What if I miss a dose? It is important not to miss a dose. Call your doctor or health care professional if you are unable to keep an appointment. What may interact with this medicine?  dofetilide  foscarnet  medicines for seizures  medicines to increase blood counts like filgrastim, pegfilgrastim, sargramostim  probenecid  pyridoxine used with altretamine  rituximab  some antibiotics like amikacin, gentamicin, neomycin, polymyxin B, streptomycin, tobramycin  sulfinpyrazone  vaccines  zalcitabine Talk to your doctor or health care professional before taking any of these medicines:  acetaminophen  aspirin  ibuprofen  ketoprofen  naproxen This list may not describe all possible interactions. Give your health care provider a list of all the medicines, herbs, non-prescription drugs, or dietary supplements you use. Also tell them if you smoke, drink alcohol, or use illegal drugs. Some items may interact with your medicine. What should I watch for while using this medicine? Your condition will be monitored carefully while you are receiving this medicine. You will need important blood work done while you are taking this medicine. This drug may make you feel generally unwell. This is not uncommon, as chemotherapy can affect healthy cells as  well as cancer cells. Report any side effects. Continue your course of treatment even though you feel ill unless your doctor tells you to stop. In some cases, you may be given additional  medicines to help with side effects. Follow all directions for their use. Call your doctor or health care professional for advice if you get a fever, chills or sore throat, or other symptoms of a cold or flu. Do not treat yourself. This drug decreases your body's ability to fight infections. Try to avoid being around people who are sick. This medicine may increase your risk to bruise or bleed. Call your doctor or health care professional if you notice any unusual bleeding. Be careful brushing and flossing your teeth or using a toothpick because you may get an infection or bleed more easily. If you have any dental work done, tell your dentist you are receiving this medicine. Avoid taking products that contain aspirin, acetaminophen, ibuprofen, naproxen, or ketoprofen unless instructed by your doctor. These medicines may hide a fever. Do not become pregnant while taking this medicine. Women should inform their doctor if they wish to become pregnant or think they might be pregnant. There is a potential for serious side effects to an unborn child. Talk to your health care professional or pharmacist for more information. Do not breast-feed an infant while taking this medicine. Drink fluids as directed while you are taking this medicine. This will help protect your kidneys. Call your doctor or health care professional if you get diarrhea. Do not treat yourself. What side effects may I notice from receiving this medicine? Side effects that you should report to your doctor or health care professional as soon as possible:  allergic reactions like skin rash, itching or hives, swelling of the face, lips, or tongue  signs of infection - fever or chills, cough, sore throat, pain or difficulty passing urine  signs of decreased platelets or bleeding - bruising, pinpoint red spots on the skin, black, tarry stools, nosebleeds  signs of decreased red blood cells - unusually weak or tired, fainting spells,  lightheadedness  breathing problems  changes in hearing  gout pain  low blood counts - This drug may decrease the number of white blood cells, red blood cells and platelets. You may be at increased risk for infections and bleeding.  nausea and vomiting  pain, swelling, redness or irritation at the injection site  pain, tingling, numbness in the hands or feet  problems with balance, movement  trouble passing urine or change in the amount of urine Side effects that usually do not require medical attention (report to your doctor or health care professional if they continue or are bothersome):  changes in vision  loss of appetite  metallic taste in the mouth or changes in taste This list may not describe all possible side effects. Call your doctor for medical advice about side effects. You may report side effects to FDA at 1-800-FDA-1088. Where should I keep my medicine? This drug is given in a hospital or clinic and will not be stored at home. NOTE: This sheet is a summary. It may not cover all possible information. If you have questions about this medicine, talk to your doctor, pharmacist, or health care provider.  2020 Elsevier/Gold Standard (2007-09-12 14:40:54) Coronavirus (COVID-19) Are you at risk?  Are you at risk for the Coronavirus (COVID-19)?  To be considered HIGH RISK for Coronavirus (COVID-19), you have to meet the following criteria:  . Traveled to [place], [place], [place],  [place] or [place]; or in the [place] to [place], [place], [place], or [place]; and have fever, cough, and shortness of breath within the last 2 weeks of travel OR . Been in close contact with a person diagnosed with COVID-19 within the last 2 weeks and have fever, cough, and shortness of breath . IF YOU DO NOT MEET THESE CRITERIA, YOU ARE CONSIDERED LOW RISK FOR COVID-19.  What to do if you are HIGH RISK for COVID-19?  Marland Kitchen If you are having a medical emergency, call 911. . Seek  medical care right away. Before you go to a doctor's office, urgent care or emergency department, call ahead and tell them about your recent travel, contact with someone diagnosed with COVID-19, and your symptoms. You should receive instructions from your physician's office regarding next steps of care.  . When you arrive at healthcare provider, tell the healthcare staff immediately you have returned from visiting [place], [place], [place], [place] or [place]; or traveled in the [place] to [place], [place], [place], or [place]; in the last two weeks or you have been in close contact with a person diagnosed with COVID-19 in the last 2 weeks.   . Tell the health care staff about your symptoms: fever, cough and shortness of breath. . After you have been seen by a medical provider, you will be either: o Tested for (COVID-19) and discharged home on quarantine except to seek medical care if symptoms worsen, and asked to  - Stay home and avoid contact with others until you get your results (4-5 days)  - Avoid travel on public transportation if possible (such as bus, train, or airplane) or o Sent to the Emergency Department by EMS for evaluation, COVID-19 testing, and possible admission depending on your condition and test results.  What to do if you are LOW RISK for COVID-19?  Reduce your risk of any infection by using the same precautions used for avoiding the common cold or flu:  Marland Kitchen Wash your hands often with soap and warm water for at least 20 seconds.  If soap and water are not readily available, use an alcohol-based hand sanitizer with at least 60% alcohol.  . If coughing or sneezing, cover your mouth and nose by coughing or sneezing into the elbow areas of your shirt or coat, into a tissue or into your sleeve (not your hands). . Avoid shaking hands with others and consider head nods or verbal greetings only. . Avoid touching your eyes, nose, or mouth with unwashed hands.  . Avoid close  contact with people who are sick. . Avoid places or events with large numbers of people in one location, like concerts or sporting events. . Carefully consider travel plans you have or are making. . If you are planning any travel outside or inside the [place], visit the CDC's Travelers' Health webpage for the latest health notices. . If you have some symptoms but not all symptoms, continue to monitor at home and seek medical attention if your symptoms worsen. . If you are having a medical emergency, call 911.   [facility] / e-Visit: [email or web address]         [facility]: [facility]: [id number]                   [facility]: [phone]

## 2019-05-04 ENCOUNTER — Other Ambulatory Visit: Payer: Self-pay

## 2019-05-04 ENCOUNTER — Ambulatory Visit
Admission: RE | Admit: 2019-05-04 | Discharge: 2019-05-04 | Disposition: A | Discharge status: Home / Self Care | Payer: Medicare Other | Source: Ambulatory Visit | Attending: Radiation Oncology | Admitting: Radiation Oncology

## 2019-05-04 ENCOUNTER — Telehealth: Payer: Self-pay | Admitting: *Deleted

## 2019-05-04 DIAGNOSIS — C320 Malignant neoplasm of glottis: Secondary | ICD-10-CM | POA: Diagnosis not present

## 2019-05-04 DIAGNOSIS — Z79899 Other long term (current) drug therapy: Secondary | ICD-10-CM | POA: Diagnosis not present

## 2019-05-04 DIAGNOSIS — Z510 Encounter for antineoplastic radiation therapy: Secondary | ICD-10-CM | POA: Diagnosis not present

## 2019-05-06 NOTE — Progress Notes (Signed)
Oncology Nurse Navigator Documentation  Met with Jean [name] in Infusion where she was rec'ing first weekly Cisplatin.  She stated procedure going well, denied needs/concerns. Later escorted her from Radiation Waiting to [facility] 3 dsg room for her initial XRT, reviewed arrival/preparation procedures.  She voiced understanding.  [name], RN, BSN Head & Neck Oncology Nurse [place] [facility] [place] [phone]

## 2019-05-07 ENCOUNTER — Other Ambulatory Visit: Payer: Self-pay

## 2019-05-07 ENCOUNTER — Ambulatory Visit
Admission: RE | Admit: 2019-05-07 | Discharge: 2019-05-07 | Disposition: A | Discharge status: Home / Self Care | Payer: Medicare Other | Source: Ambulatory Visit | Attending: Radiation Oncology | Admitting: Radiation Oncology

## 2019-05-07 ENCOUNTER — Other Ambulatory Visit: Payer: Self-pay | Admitting: Radiation Oncology

## 2019-05-07 DIAGNOSIS — Z510 Encounter for antineoplastic radiation therapy: Secondary | ICD-10-CM | POA: Diagnosis not present

## 2019-05-07 DIAGNOSIS — C320 Malignant neoplasm of glottis: Secondary | ICD-10-CM | POA: Diagnosis not present

## 2019-05-07 DIAGNOSIS — Z79899 Other long term (current) drug therapy: Secondary | ICD-10-CM | POA: Diagnosis not present

## 2019-05-07 MED ORDER — SONAFINE EX EMUL
1.0000 "application " | Freq: Once | CUTANEOUS | Status: AC
  Administered 2019-05-07: 1 via TOPICAL

## 2019-05-07 MED ORDER — LIDOCAINE VISCOUS HCL 2 % MT SOLN: OROMUCOSAL | 5 refills | Status: DC

## 2019-05-07 NOTE — Progress Notes (Signed)

## 2019-05-08 ENCOUNTER — Telehealth: Payer: Self-pay | Admitting: Hematology

## 2019-05-08 ENCOUNTER — Ambulatory Visit
Admission: RE | Admit: 2019-05-08 | Discharge: 2019-05-08 | Disposition: A | Discharge status: Home / Self Care | Payer: Medicare Other | Source: Ambulatory Visit | Attending: Radiation Oncology | Admitting: Radiation Oncology

## 2019-05-08 ENCOUNTER — Ambulatory Visit: Payer: Medicare Other | Attending: Radiation Oncology | Admitting: Physical Therapy

## 2019-05-08 ENCOUNTER — Other Ambulatory Visit: Payer: Self-pay

## 2019-05-08 ENCOUNTER — Encounter: Payer: Self-pay | Admitting: Physical Therapy

## 2019-05-08 DIAGNOSIS — R293 Abnormal posture: Secondary | ICD-10-CM | POA: Diagnosis not present

## 2019-05-08 DIAGNOSIS — C320 Malignant neoplasm of glottis: Secondary | ICD-10-CM | POA: Diagnosis not present

## 2019-05-08 DIAGNOSIS — Z510 Encounter for antineoplastic radiation therapy: Secondary | ICD-10-CM | POA: Diagnosis not present

## 2019-05-08 DIAGNOSIS — Z79899 Other long term (current) drug therapy: Secondary | ICD-10-CM | POA: Diagnosis not present

## 2019-05-08 NOTE — Telephone Encounter (Signed)
GZ PAL 12.23 f/u moved to APP per GZ. Per APP manager scheduled patient with [facility]. Left message re change. Patient will get updated schedule at next visit.

## 2019-05-08 NOTE — Therapy (Signed)
in 30 seconds which is about average for her age. Her cervical and shoulder ROM is overall WFL. Will see pt in about 8 weeks after she completes radiation to  remeasure neck circumference and instruct pt in a home exercise program for general strengthening if needed.    Personal Factors and Comorbidities  Age;Comorbidity 1    Comorbidities  rheumatoid arthritis    Examination-Activity Limitations  Squat;Lift;Sleep    Stability/Clinical Decision Making  Evolving/Moderate complexity    Clinical Decision Making  Moderate    Rehab Potential  Good    PT Frequency  --   1 follow up visit in 8 wks   PT Duration  8 weeks    PT Treatment/Interventions  ADLs/Self Care Home Management;Therapeutic exercise;Patient/family education    PT Next Visit Plan  reassess UE and LE strength and ROM, remeasure neck circumference, recert or d/c    PT Home Exercise Plan  head and neck stretches, walking program    Consulted and Agree with Plan of Care  Patient       Patient will benefit from skilled therapeutic intervention in order to improve the following deficits and impairments:  Postural dysfunction, Decreased knowledge of precautions  Visit Diagnosis: Abnormal posture  Malignant neoplasm of glottis [facility])     Problem List Patient Active Problem List   Diagnosis Date Noted  . Port-A-Cath in place 05/02/2019  . 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 Vitamin D deficiency 01/23/2017  . Rheumatoid arthritis, seronegative, multiple sites ([place]) 01/19/2017  . High risk medication use 01/19/2017  . Primary osteoarthritis of both hands 01/19/2017  . Primary osteoarthritis of both knees 01/19/2017  . Fatigue 01/19/2017  . Thrombocytosis ([place]) 01/19/2017  . Primary osteoarthritis of both feet 01/19/2017  . Macrocytic anemia 01/19/2017  . Screening-pulmonary TB 04/26/2016    Allyson Sabal [facility] 05/08/2019, 9:50 AM  [place], [place], [address] Phone: [phone]   Fax:  [phone]  Name: Jean [name] MRN: [id number] Date of Birth:  05-17-1946  Jean [name], PT 05/08/19 9:50 AM  [place], [place], [address] Phone: [phone]   Fax:  [phone]  Physical Therapy Evaluation  Patient Details  Name: Jean [name] MRN: [id number] Date of Birth: [birth year] Referring Provider (PT): Reita May Date: 05/08/2019  PT End of Session - 05/08/19 0941    Visit Number  1    Number of Visits  2    Date for PT Re-Evaluation  07/03/19    PT Start Time  0900    PT Stop Time  0938    PT Time Calculation (min)  38 min    Activity Tolerance  Patient tolerated treatment well    Behavior During Therapy  [facility] for tasks assessed/performed       Past Medical History:  Diagnosis Date  . Arthritis   . Hypertension   . Rheumatoid arthritis ([place])   . Vocal cord mass     Past Surgical History:  Procedure Laterality Date  . ECTOPIC PREGNANCY SURGERY    . EXTERNAL EAR SURGERY Right    removal of cyst or gland  . IR GASTROSTOMY TUBE MOD SED  04/26/2019  . IR IMAGING GUIDED PORT INSERTION  04/26/2019  . MICROLARYNGOSCOPY Left 03/26/2019   Procedure: DIRECT MICROLARYNGOSCOPY WITH BIOPSY OF VOCAL CORD MASS;  Surgeon: Leta Baptist, MD;  Location: [facility];  Service: ENT;  Laterality: Left;    There were no vitals filed for this visit.   Subjective Assessment - 05/08/19 0904    Subjective  The pain from my feeding tube is more manageable. I am so miserable from constipation. I have had 4 radiation treatments.    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  No/denies    Pain Score  0-No pain         [facility] PT Assessment - 05/08/19 0001      Assessment   Medical Diagnosis  SCC L vocal cord    Referring Provider (PT)  Isidore Moos    Onset  Date/Surgical Date  03/27/19    Hand Dominance  Right    Prior Therapy  none      Precautions   Precautions  Other (comment)    Precaution Comments  at risk for lymphedema      Restrictions   Weight Bearing Restrictions  No      Balance Screen   Has the patient fallen in the past 6 months  No    Has the patient had a decrease in activity level because of a fear of falling?   No    Is the patient reluctant to leave their home because of a fear of falling?   No      Home Film/video editor residence    Living Arrangements  Children;Other relatives    Available Help at Discharge  Family    Type of Home  House      Prior Function   Level of [place]  Retired    Leisure  pt does not exercise consistently      Cognition   Overall Cognitive Status  Within Functional Limits for tasks assessed      Posture/Postural Control   Posture/Postural Control  in 30 seconds which is about average for her age. Her cervical and shoulder ROM is overall WFL. Will see pt in about 8 weeks after she completes radiation to  remeasure neck circumference and instruct pt in a home exercise program for general strengthening if needed.    Personal Factors and Comorbidities  Age;Comorbidity 1    Comorbidities  rheumatoid arthritis    Examination-Activity Limitations  Squat;Lift;Sleep    Stability/Clinical Decision Making  Evolving/Moderate complexity    Clinical Decision Making  Moderate    Rehab Potential  Good    PT Frequency  --   1 follow up visit in 8 wks   PT Duration  8 weeks    PT Treatment/Interventions  ADLs/Self Care Home Management;Therapeutic exercise;Patient/family education    PT Next Visit Plan  reassess UE and LE strength and ROM, remeasure neck circumference, recert or d/c    PT Home Exercise Plan  head and neck stretches, walking program    Consulted and Agree with Plan of Care  Patient       Patient will benefit from skilled therapeutic intervention in order to improve the following deficits and impairments:  Postural dysfunction, Decreased knowledge of precautions  Visit Diagnosis: Abnormal posture  Malignant neoplasm of glottis [facility])     Problem List Patient Active Problem List   Diagnosis Date Noted  . Port-A-Cath in place 05/02/2019  . 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 Vitamin D deficiency 01/23/2017  . Rheumatoid arthritis, seronegative, multiple sites ([place]) 01/19/2017  . High risk medication use 01/19/2017  . Primary osteoarthritis of both hands 01/19/2017  . Primary osteoarthritis of both knees 01/19/2017  . Fatigue 01/19/2017  . Thrombocytosis ([place]) 01/19/2017  . Primary osteoarthritis of both feet 01/19/2017  . Macrocytic anemia 01/19/2017  . Screening-pulmonary TB 04/26/2016    Allyson Sabal [facility] 05/08/2019, 9:50 AM  [place], [place], [address] Phone: [phone]   Fax:  [phone]  Name: Jean [name] MRN: [id number] Date of Birth:  05-17-1946  Jean [name], PT 05/08/19 9:50 AM

## 2019-05-09 ENCOUNTER — Ambulatory Visit
Admission: RE | Admit: 2019-05-09 | Discharge: 2019-05-09 | Disposition: A | Discharge status: Home / Self Care | Payer: Medicare Other | Source: Ambulatory Visit | Attending: Radiation Oncology | Admitting: Radiation Oncology

## 2019-05-09 ENCOUNTER — Other Ambulatory Visit: Payer: Self-pay

## 2019-05-09 ENCOUNTER — Telehealth: Payer: Self-pay | Admitting: *Deleted

## 2019-05-09 ENCOUNTER — Inpatient Hospital Stay: Payer: Medicare Other | Admitting: Hematology

## 2019-05-09 ENCOUNTER — Inpatient Hospital Stay: Payer: Medicare Other

## 2019-05-09 ENCOUNTER — Encounter: Payer: Self-pay | Admitting: Hematology

## 2019-05-09 VITALS — BP 127/63 | HR 80 | Temp 97.8°F | Resp 18 | Ht 63.0 in | Wt 116.9 lb

## 2019-05-09 DIAGNOSIS — E876 Hypokalemia: Secondary | ICD-10-CM

## 2019-05-09 DIAGNOSIS — Z95828 Presence of other vascular implants and grafts: Secondary | ICD-10-CM

## 2019-05-09 DIAGNOSIS — Z79899 Other long term (current) drug therapy: Secondary | ICD-10-CM | POA: Diagnosis not present

## 2019-05-09 DIAGNOSIS — D72829 Elevated white blood cell count, unspecified: Secondary | ICD-10-CM

## 2019-05-09 DIAGNOSIS — D473 Essential (hemorrhagic) thrombocythemia: Secondary | ICD-10-CM

## 2019-05-09 DIAGNOSIS — D75839 Thrombocytosis, unspecified: Secondary | ICD-10-CM

## 2019-05-09 DIAGNOSIS — T402X5A Adverse effect of other opioids, initial encounter: Secondary | ICD-10-CM

## 2019-05-09 DIAGNOSIS — K5903 Drug induced constipation: Secondary | ICD-10-CM | POA: Insufficient documentation

## 2019-05-09 DIAGNOSIS — T451X5A Adverse effect of antineoplastic and immunosuppressive drugs, initial encounter: Secondary | ICD-10-CM

## 2019-05-09 DIAGNOSIS — C320 Malignant neoplasm of glottis: Secondary | ICD-10-CM

## 2019-05-09 DIAGNOSIS — D6481 Anemia due to antineoplastic chemotherapy: Secondary | ICD-10-CM

## 2019-05-09 DIAGNOSIS — Z5111 Encounter for antineoplastic chemotherapy: Secondary | ICD-10-CM | POA: Diagnosis not present

## 2019-05-09 DIAGNOSIS — Z510 Encounter for antineoplastic radiation therapy: Secondary | ICD-10-CM | POA: Diagnosis not present

## 2019-05-09 LAB — CBC WITH DIFFERENTIAL (CANCER CENTER ONLY)
Abs Immature Granulocytes: 0.1 10*3/uL — ABNORMAL HIGH (ref 0.00–0.07)
Basophils Absolute: 0 10*3/uL (ref 0.0–0.1)
Basophils Relative: 0 %
Eosinophils Absolute: 0.1 10*3/uL (ref 0.0–0.5)
Eosinophils Relative: 0 %
HCT: 33.1 % — ABNORMAL LOW (ref 36.0–46.0)
Hemoglobin: 11 g/dL — ABNORMAL LOW (ref 12.0–15.0)
Immature Granulocytes: 1 %
Lymphocytes Relative: 22 %
Lymphs Abs: 2.4 10*3/uL (ref 0.7–4.0)
MCH: 32.1 pg (ref 26.0–34.0)
MCHC: 33.2 g/dL (ref 30.0–36.0)
MCV: 96.5 fL (ref 80.0–100.0)
Monocytes Absolute: 0.7 10*3/uL (ref 0.1–1.0)
Monocytes Relative: 6 %
Neutro Abs: 7.9 10*3/uL — ABNORMAL HIGH (ref 1.7–7.7)
Neutrophils Relative %: 71 %
Platelet Count: 512 10*3/uL — ABNORMAL HIGH (ref 150–400)
RBC: 3.43 MIL/uL — ABNORMAL LOW (ref 3.87–5.11)
RDW: 13.5 % (ref 11.5–15.5)
WBC Count: 11.1 10*3/uL — ABNORMAL HIGH (ref 4.0–10.5)
nRBC: 0 % (ref 0.0–0.2)

## 2019-05-09 LAB — BASIC METABOLIC PANEL - CANCER CENTER ONLY
Anion gap: 10 (ref 5–15)
BUN: 16 mg/dL (ref 8–23)
CO2: 26 mmol/L (ref 22–32)
Calcium: 8.5 mg/dL — ABNORMAL LOW (ref 8.9–10.3)
Chloride: 101 mmol/L (ref 98–111)
Creatinine: 0.8 mg/dL (ref 0.44–1.00)
GFR, Est AFR Am: >60 mL/min (ref >60)
GFR, Estimated: >60 mL/min (ref >60)
Glucose, Bld: 101 mg/dL — ABNORMAL HIGH (ref 70–99)
Potassium: 3.4 mmol/L — ABNORMAL LOW (ref 3.5–5.1)
Sodium: 137 mmol/L (ref 135–145)

## 2019-05-09 LAB — MAGNESIUM: Magnesium: 1.9 mg/dL (ref 1.7–2.4)

## 2019-05-09 MED ORDER — HEPARIN SOD (PORK) LOCK FLUSH 100 UNIT/ML IV SOLN
500.0000 [IU] | Freq: Once | INTRAVENOUS | Status: AC | PRN
  Administered 2019-05-09: 500 [IU]
  Filled 2019-05-09: qty 5

## 2019-05-09 MED ORDER — POTASSIUM CHLORIDE CRYS ER 20 MEQ PO TBCR: 20.0000 meq | EXTENDED_RELEASE_TABLET | Freq: Every day | ORAL | 2 refills | Status: DC

## 2019-05-09 MED ORDER — SODIUM CHLORIDE 0.9% FLUSH
10.0000 mL | INTRAVENOUS | Status: DC | PRN
  Administered 2019-05-09: 14:00:00 10 mL
  Filled 2019-05-09: qty 10

## 2019-05-09 NOTE — Progress Notes (Signed)
[facility] OFFICE PROGRESS NOTE  Patient Care Team: Charolette Forward, MD as PCP - General (Cardiology) Wyatt Portela, MD as Consulting Physician (Oncology) Charolette Forward, MD as Referring Physician (Cardiology) Eppie Gibson, MD as Attending Physician (Radiation Oncology) Leota Sauers, RN as Oncology Nurse Navigator  HEME/ONC OVERVIEW: 1. 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 Port and PEG in 04/2019   TREATMENT SUMMARY:  05/02/2019 - present: definitive chemoRT with weekly cisplatin   PERTINENT NON-HEM/ONC PROBLEMS: 1. 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 -S/p 1 cycle of weekly cisplatin concurrent with RT in the definitive setting  -Labs adequate today, proceed with Cycle 2 of chemotherapy -Plan for 7 doses  -PRN anti-emetics: Zofran, Compazine, Ativan and dexamethasone  Chemotherapy-associated anemia -Secondary to chemotherapy, as well as hx of methotrexate for rheumatoid arthritis  -Hgb 11.0, stable -Patient denies any symptom of bleeding -We will monitor for now  Leukocytosis -WBC 11.1k today, new -Likely due to recent steroid (for chemotherapy-associated delayed nausea) -Patient denies any symptoms of infection -We will monitor it for now   Thrombocytosis -Likely secondary to rheumatoid arthritis -Plts 512k today, stable -We will monitor it for now   Hypokalemia -Likely due to electrolyte loss from chemotherapy -K 3.4 today, mildly low -I have prescribed KCl 36mq daily -We will monitor it closely    Opioid-induced constipation -Patient tried a variety of supportive measures, including Miralax, Doculax, and Fleet enema -I recommend the patient to take Miralax BID and Doculax daily to achieve soft BM's; she may also add Milk of Magnesia if no improvement -I counseled the patient on avoiding enemas due to the risk of bacterial translocation from the gut   Pain around the PEG site -Patient tried Norco, but it was causing her to feel "loopy and dizzy" -I encouraged the patient to try IR liquid morphine, and she can titrate the dose to small amount as needed to see if it provides some relief without significant side effects -I also counseled the patient on avoiding taking Norco and morphine at the same time  No orders of the defined types were placed in this encounter.  All questions were answered. The patient knows to call the clinic with any problems, questions or concerns. No barriers to learning was detected.  Return in 1 week for Cycle 3 of chemotherapy.  YTish Men MD 05/09/2019 2:57 PM  CHIEF COMPLAINT: "I am still having some pain around the feeding tube"  INTERVAL HISTORY: Ms. SReindlreturns to clinic for follow-up of SCCa of the left vocal cord on definitive chemoradiation with weekly cisplatin.  Patient reports that over the weekend, she had one episode of transient ringing in the bilateral ears, but it resolved very quickly and she has not had any recurrent tinnitus.  Her hearing is unchanged.  She took half a tab of Norco for abdominal pain from the feeding tube placement, but it caused her to feel "loopy and dizzy", so she has not taken it any more.  She is afraid to take the morphine because she is worried that it may be "  too strong."  She also has had some constipation, for which she has tried Miralax, Doculax, and finally Fleet enema, with improvement.  She is able to eat and drink without any difficulty.  She denies any other complaint today.      REVIEW OF SYSTEMS:    Constitutional: ( - ) fevers, ( - )  chills , ( - ) night sweats Eyes: ( - ) blurriness of vision, ( - ) double vision, ( - ) watery eyes Ears, nose, mouth, throat, and face: ( - ) mucositis, ( - ) sore throat Respiratory: ( - ) cough, ( - ) dyspnea, ( - ) wheezes Cardiovascular: ( - ) palpitation, ( - ) chest discomfort, ( + ) lower extremity swelling Gastrointestinal:  ( - ) nausea, ( - ) heartburn, ( + ) change in bowel habits Skin: ( - ) abnormal skin rashes Lymphatics: ( - ) new lymphadenopathy, ( - ) easy bruising Neurological: ( - ) numbness, ( - ) tingling, ( - ) new weaknesses Behavioral/Psych: ( - ) mood change, ( - ) new changes  All other systems were reviewed with the patient and are negative.  SUMMARY OF ONCOLOGIC HISTORY: Oncology History  Squamous cell carcinoma of left vocal cord ([place])  03/26/2019 Pathology Results   DIAGNOSIS:   A. VOCAL CORD MASS, LEFT, EXCISION:  - Squamous cell carcinoma.  - See comment.    04/06/2019 Initial Diagnosis   Squamous cell carcinoma of left vocal cord ([place])   04/10/2019 Imaging   PET: IMPRESSION: 1. Left glottic lesion is hypermetabolic with maximum SUV 8.9, compatible with malignancy. No discrete adenopathy in the neck. 2. Extensive periarticular activity especially around the large joints, likely related to rheumatoid arthritis. 3. Small but hypermetabolic axillary and left external iliac lymph nodes are most likely reactive to the patient's rheumatoid arthritis, but merit surveillance. 4. Mild right hydronephrosis without hydroureter, query right UPJ narrowing. Consider follow renal sonography. 5. Prominent but not hypermetabolic left adnexa, possibly due to prominent ovary or left eccentricity of the uterus, correlate with surgical history. This could be further investigated with pelvic sonography if clinically warranted. 6. Other imaging findings of potential clinical significance: Chronic left maxillary sinusitis.  Aortic Atherosclerosis (ICD10-I70.0). Coronary atherosclerosis. Emphysema (ICD10-J43.9). Sigmoid colon diverticulosis.   04/10/2019 Imaging   CT neck w/ contrast: IMPRESSION: 1. Left glottic mass extending to but not beyond the anterior commissure. There is extension into the left paraglottic fat without cartilage erosion. 2. No adenopathy. 3. Aortic Atherosclerosis (ICD10-I70.0) and Emphysema (ICD10-J43.9).   04/11/2019 Cancer Staging   Staging form: Larynx - Glottis, AJCC 8th Edition - Clinical stage from 04/11/2019: Stage III (cT3, cN0, cM0) - Signed by Eppie Gibson, MD on 04/11/2019   05/03/2019 -  Chemotherapy   The patient had palonosetron (ALOXI) injection 0.25 mg, 0.25 mg, Intravenous,  Once, 1 of 7 cycles Administration: 0.25 mg (05/03/2019) CISplatin (PLATINOL) 62 mg in sodium chloride 0.9 % 250 mL chemo infusion, 40 mg/m2 = 62 mg, Intravenous,  Once, 1 of 7 cycles Administration: 62 mg (05/03/2019) fosaprepitant (EMEND) 150 mg, dexamethasone (DECADRON) 12 mg in sodium chloride 0.9 % 145 mL IVPB, , Intravenous,  Once, 1 of 7 cycles Administration:  (05/03/2019)  for chemotherapy treatment.      I have reviewed the past medical history, past surgical history, social history and family history with the patient and they are unchanged from previous note.  ALLERGIES:  is allergic to sulfa antibiotics.  MEDICATIONS:  Current Outpatient Medications  Medication Sig Dispense Refill  . Adalimumab (HUMIRA PEN) 40 MG/0.4ML PNKT Inject 40 mg into the skin every 14 (fourteen) days. (Patient taking differently: Inject 40 mg into the skin every 21 ( twenty-one) days. ) 3 each 0  . amLODipine (NORVASC) 5 MG tablet Take 5 mg by mouth daily.  0  . Cholecalciferol (VITAMIN D) 50 MCG (2000 UT) CAPS Take 2,000 Units by mouth daily.     Marland Kitchen dexamethasone (DECADRON) 4 MG tablet Take 2 tablets by mouth once a day on the day after chemotherapy and then take 2 tablets two times a day for 2 days.  Take with food. 30 tablet 1  . folic acid (FOLVITE) 1 MG tablet TAKE 2 TABLETS(2 MG) BY MOUTH DAILY (Patient taking differently: Take 2 mg by mouth daily. ) 180 tablet 3  . lidocaine (XYLOCAINE) 2 % solution Patient: Mix 1part 2% viscous lidocaine, 1part H20. Swish & swallow 65m of diluted mixture, 39m before meals and at bedtime, up to QID 100 mL 5  . lidocaine-prilocaine (EMLA) cream Apply to affected area once 30 g 3  . LORazepam (ATIVAN) 0.5 MG tablet Take 1 tablet (0.5 mg total) by mouth every 6 (six) hours as needed (Nausea or vomiting). 30 tablet 0  . losartan (COZAAR) 100 MG tablet Take 100 mg by mouth daily.   0  . ondansetron (ZOFRAN) 8 MG tablet Take 1 tablet (8 mg total) by mouth 2 (two) times daily as needed. Start on the third day after chemotherapy. 30 tablet 1  . prochlorperazine (COMPAZINE) 10 MG tablet Take 1 tablet (10 mg total) by mouth every 6 (six) hours as needed (Nausea or vomiting). 30 tablet 1  . sodium fluoride (PREVIDENT 5000 PLUS) 1.1 % CREA dental cream Apply to tooth brush. Brush teeth for 2 minutes. Spit out excess-DO NOT swallow. DO NOT rinse afterwards. Repeat nightly. 1 Tube prn  . vitamin B-12 (CYANOCOBALAMIN) 1000 MCG tablet Take 1,000 mcg by mouth daily.    . ALLERGIST TRAY 1CC 27GX1/2" 27G X 1/2" 1 ML KIT TO BE USED WITH WEEKLY METHOTREXATE INJECTIONS ONCE A WEEK (Patient not taking: Reported on 05/09/2019) 25 each 0  . HYDROcodone-acetaminophen (NORCO) 10-325 MG tablet Take 1 tablet by mouth every 6 (six) hours as needed. (Patient not taking: Reported on 05/09/2019) 30 tablet 0  . methotrexate 50 MG/2ML injection INJECT 0.6 ML(15 MG TOTAL) UNDER THE SKIN ONCE A WEEK (Patient not taking: Reported on 05/09/2019) 8 mL 0  . Morphine Sulfate (MORPHINE CONCENTRATE) 10 mg / 0.5 ml concentrated solution Take 0.5 mLs (10 mg total) by mouth every 6 (six) hours as needed for severe pain. (Patient not taking: Reported on 05/02/2019) 120 mL 0  . ondansetron (ZOFRAN) 4 MG  tablet Take 1 tablet (4 mg total) by mouth every 6 (six) hours as needed for nausea or vomiting. (Patient not taking: Reported on 05/02/2019) 30 tablet 2  . potassium chloride SA (KLOR-CON) 20 MEQ tablet Take 1 tablet (20 mEq total) by mouth daily. 30 tablet 2   No current facility-administered medications for this visit.     PHYSICAL EXAMINATION: ECOG PERFORMANCE STATUS: 1 - Symptomatic but completely ambulatory  Today's Vitals   05/09/19 1416 05/09/19 1419  BP: 127/63   Pulse: 80   Resp: 18   Temp: 97.8 F (36.6 C)   TempSrc: Temporal   SpO2: 100%   Weight: 116 lb 14.4 oz (53 kg)   Height: 5' 3"  (1.6 m)   PainSc:  0-No pain  Body mass index is 20.71 kg/m.  Filed Weights   05/09/19 1416  Weight: 116 lb 14.4 oz (53 kg)    GENERAL: alert, no distress and comfortable SKIN: skin color, texture, turgor are normal, no rashes or significant lesions EYES: conjunctiva are pink and non-injected, sclera clear OROPHARYNX: no exudate, no erythema; lips, buccal mucosa, and tongue normal  NECK: supple, non-tender LYMPH:  no palpable lymphadenopathy in the cervical LUNGS: clear to auscultation with normal breathing effort HEART: regular rate & rhythm and no murmurs and no lower extremity edema ABDOMEN: soft, non-tender, non-distended, normal bowel sounds Musculoskeletal: no cyanosis of digits and no clubbing  PSYCH: alert & oriented x 3, fluent speech  LABORATORY DATA:  I have reviewed the data as listed    Component Value Date/Time   NA 137 05/09/2019 1338   NA 141 03/05/2014 1050   K 3.4 (L) 05/09/2019 1338   K 3.9 03/05/2014 1050   CL 101 05/09/2019 1338   CO2 26 05/09/2019 1338   CO2 24 03/05/2014 1050   GLUCOSE 101 (H) 05/09/2019 1338   GLUCOSE 99 03/05/2014 1050   BUN 16 05/09/2019 1338   BUN 10.6 03/05/2014 1050   CREATININE 0.80 05/09/2019 1338   CREATININE 0.77 01/15/2019 0907   CREATININE 0.8 03/05/2014 1050   CALCIUM 8.5 (L) 05/09/2019 1338   CALCIUM 8.8  03/05/2014 1050   PROT 7.2 04/18/2019 0822   PROT 6.5 03/05/2014 1050   ALBUMIN 3.8 04/18/2019 0822   ALBUMIN 3.2 (L) 03/05/2014 1050   AST 19 04/18/2019 0822   AST 12 03/05/2014 1050   ALT 22 04/18/2019 0822   ALT 28 03/05/2014 1050   ALKPHOS 106 04/18/2019 0822   ALKPHOS 85 03/05/2014 1050   BILITOT 0.2 (L) 04/18/2019 0822   BILITOT 0.20 03/05/2014 1050   GFRNONAA >60 05/09/2019 1338   GFRNONAA 78 01/15/2019 0907   GFRAA >60 05/09/2019 1338   GFRAA 91 01/15/2019 0907    No results found for: SPEP, UPEP  Lab Results  Component Value Date   WBC 11.1 (H) 05/09/2019   NEUTROABS 7.9 (H) 05/09/2019   HGB 11.0 (L) 05/09/2019   HCT 33.1 (L) 05/09/2019   MCV 96.5 05/09/2019   PLT 512 (H) 05/09/2019      Chemistry      Component Value Date/Time   NA 137 05/09/2019 1338   NA 141 03/05/2014 1050   K 3.4 (L) 05/09/2019 1338   K 3.9 03/05/2014 1050   CL 101 05/09/2019 1338   CO2 26 05/09/2019 1338   CO2 24 03/05/2014 1050   BUN 16 05/09/2019 1338   BUN 10.6 03/05/2014 1050   CREATININE 0.80 05/09/2019 1338   CREATININE 0.77 01/15/2019 0907   CREATININE 0.8 03/05/2014 1050      Component Value Date/Time   CALCIUM 8.5 (L) 05/09/2019 1338   CALCIUM 8.8 03/05/2014 1050   ALKPHOS 106 04/18/2019 0822   ALKPHOS 85 03/05/2014 1050   AST 19 04/18/2019 0822   AST 12 03/05/2014 1050   ALT 22 04/18/2019 0822   ALT 28 03/05/2014 1050   BILITOT 0.2 (L) 04/18/2019 0822   BILITOT 0.20 03/05/2014 1050       RADIOGRAPHIC STUDIES: I have personally reviewed the radiological images as listed below and agreed with the findings in the report. Ct Soft Tissue Neck W Contrast  Result Date: 04/11/2019 CLINICAL DATA:  Initial staging of glottic cancer EXAM: CT NECK WITH CONTRAST TECHNIQUE: Multidetector CT imaging of the neck was performed using  the standard protocol following the bolus administration of intravenous contrast. CONTRAST:  31m OMNIPAQUE IOHEXOL 300 MG/ML  SOLN COMPARISON:   None. FINDINGS: Pharynx and larynx: Avidly enhancing mass centered at the left glottis which measures at least 15 mm and extends to but not clearly beyond the anterior commissure. There is growth into the left paraglottic fat without cartilage erosion. No sub site extension. Salivary glands: No inflammation, mass, or stone. Thyroid: Normal. Lymph nodes: None enlarged or abnormal density. Vascular: Atherosclerotic calcification without evident obstructive process. Limited intracranial: Negative Visualized orbits: Negative Mastoids and visualized paranasal sinuses: Retention cyst in the left maxillary sinus Skeleton: Upper and lower cervical facet spurring and midcervical spondylosis with ridging. Upper chest: Centrilobular emphysema. IMPRESSION: 1. Left glottic mass extending to but not beyond the anterior commissure. There is extension into the left paraglottic fat without cartilage erosion. 2. No adenopathy. 3. Aortic Atherosclerosis (ICD10-I70.0) and Emphysema (ICD10-J43.9). Electronically Signed   By: JMonte FantasiaM.D.   On: 04/11/2019 06:08   Ir Gastrostomy Tube Mod Sed  Result Date: 04/26/2019 INDICATION: 73year old with squamous cell carcinoma of the left vocal cord. Patient is scheduled for chemotherapy and radiation. EXAM: PERCUTANEOUS GASTROSTOMY TUBE WITH FLUOROSCOPIC GUIDANCE Physician: AStephan Minister HAnselm Pancoast MD MEDICATIONS: Antibiotics were given prior to the Port-A-Cath which was performed immediately prior to this procedure. Glucagon 1 mg IV ANESTHESIA/SEDATION: Versed 1 mg IV; Fentanyl 37.5 mcg IV Moderate Sedation Time:  14 minutes The patient was continuously monitored during the procedure by the interventional radiology nurse under my direct supervision. FLUOROSCOPY TIME:  Fluoroscopy Time: 2 minutes 54 seconds (7 mGy). COMPLICATIONS: None immediate. PROCEDURE: The procedure was explained to the patient. The risks and benefits of the procedure were discussed and the patient's questions were  addressed. Informed consent was obtained from the patient. The patient was placed on the interventional table. Fluoroscopy demonstrated oral contrast in the transverse colon. An orogastric tube was placed with fluoroscopic guidance. The anterior abdomen was prepped and draped in sterile fashion. Maximal barrier sterile technique was utilized including caps, mask, sterile gowns, sterile gloves, sterile drape, hand hygiene and skin antiseptic. Stomach was inflated with air through the orogastric tube. The skin and subcutaneous tissues were anesthetized with 1% lidocaine. A 17 gauge needle was directed into the distended stomach with fluoroscopic guidance. A wire was advanced into the stomach and a T-tact was deployed. A 9-French vascular sheath was placed and the orogastric tube was snared using a Gooseneck snare device. The orogastric tube and snare were pulled out of the patient's mouth. The snare device was connected to a 20-French gastrostomy tube. The snare device and gastrostomy tube were pulled through the patient's mouth and out the anterior abdominal wall. The gastrostomy tube was cut to an appropriate length. Contrast injection through gastrostomy tube confirmed placement within the stomach. Fluoroscopic images were obtained for documentation. The gastrostomy tube was flushed with normal saline. IMPRESSION: Successful fluoroscopic guided percutaneous gastrostomy tube placement. Electronically Signed   By: AMarkus DaftM.D.   On: 04/26/2019 15:51   Nm Pet Image Initial (pi) Skull Base To Thigh  Result Date: 04/10/2019 CLINICAL DATA:  Initial treatment strategy for squamous cell carcinoma of the vocal cord. History of rheumatoid arthritis and osteoarthritis. EXAM: NUCLEAR MEDICINE PET SKULL BASE TO THIGH TECHNIQUE: 5.7 mCi F-18 FDG was injected intravenously. Full-ring PET imaging was performed from the skull base to thigh after the radiotracer. CT data was obtained and used for attenuation correction and  anatomic localization. Fasting blood glucose:  92 mg/dl COMPARISON:  None FINDINGS: Mediastinal blood pool activity: SUV max 2.2 Liver activity: SUV max NA NECK: Left eccentric glottic mass, maximum SUV 8.9. Incidental CT findings: Mucous retention cyst in left maxillary sinus. Atherosclerotic calcification in the left common carotid artery. No hypermetabolic adenopathy in the neck is identified. CHEST: Diffuse subtle accentuated esophageal activity, maximum SUV 3.8 in the distal esophagus. Given the diffuse nature of this is probably physiologic. 1.0 cm AP window lymph node, maximum SUV 2.7. Two small left axillary lymph nodes are both hypermetabolic. The more anterior node on image 56/5 has a parenchymal thickness of 0.5 cm in short axis, maximum SUV 3.6. A lymph node with parenchymal thickness of 0.5 cm on the right has a maximum SUV of 3.2. Smaller subpectoral lymph nodes are identified. Incidental CT findings: Coronary, aortic arch, and branch vessel atherosclerotic vascular disease. Centrilobular emphysema. ABDOMEN/PELVIS: Diffuse gastric activity is likely physiologic, maximum SUV 4.5. Scattered physiologic activity in bowel. A left external iliac node measuring 0.7 cm in short axis on image 147/5 has maximum SUV of 4.4. Incidental CT findings: Hypodense hepatic lesions are observed, the larger posterior right hepatic lobe lesion visibly photopenic, favoring benign lesions. Mild right hydronephrosis without hydroureter, query right UPJ narrowing. Prominent stool throughout the colon favors constipation. Aortoiliac atherosclerotic vascular disease. Sigmoid colon diverticulosis. Prominence of the left adnexa versus left eccentric uterus, but without hypermetabolic activity. SKELETON: Extensive hypermetabolic activity along many of the visualized large joints including the shoulders, hips, elbows, wrists, and along the left sternoclavicular joint. Aside from the sternoclavicular involvement this is mostly  symmetric and probably from active Incidental CT findings: none IMPRESSION: 1. Left glottic lesion is hypermetabolic with maximum SUV 8.9, compatible with malignancy. No discrete adenopathy in the neck. 2. Extensive periarticular activity especially around the large joints, likely related to rheumatoid arthritis. 3. Small but hypermetabolic axillary and left external iliac lymph nodes are most likely reactive to the patient's rheumatoid arthritis, but merit surveillance. 4. Mild right hydronephrosis without hydroureter, query right UPJ narrowing. Consider follow renal sonography. 5. Prominent but not hypermetabolic left adnexa, possibly due to prominent ovary or left eccentricity of the uterus, correlate with surgical history. This could be further investigated with pelvic sonography if clinically warranted. 6. Other imaging findings of potential clinical significance: Chronic left maxillary sinusitis. Aortic Atherosclerosis (ICD10-I70.0). Coronary atherosclerosis. Emphysema (ICD10-J43.9). Sigmoid colon diverticulosis. Electronically Signed   By: Van Clines M.D.   On: 04/10/2019 15:53   Ir Imaging Guided Port Insertion  Result Date: 04/26/2019 INDICATION: 73 year old with squamous cell carcinoma of the left focal cord. Port-A-Cath needed for chemotherapy. EXAM: FLUOROSCOPIC AND ULTRASOUND GUIDED PLACEMENT OF A SUBCUTANEOUS PORT COMPARISON:  None. MEDICATIONS: Ancef 2 g; The antibiotic was administered within an appropriate time interval prior to skin puncture. ANESTHESIA/SEDATION: Versed 1.5 mg IV; Fentanyl 62.5 mcg IV; Moderate Sedation Time:  36 The patient was continuously monitored during the procedure by the interventional radiology nurse under my direct supervision. FLUOROSCOPY TIME:  42 seconds, 1 mGy COMPLICATIONS: None immediate. PROCEDURE: The procedure, risks, benefits, and alternatives were explained to the patient. Questions regarding the procedure were encouraged and answered. The patient  understands and consents to the procedure. Patient was placed supine on the interventional table. Ultrasound confirmed a patent right internal jugular vein. Ultrasound image was saved for documentation. The right chest and neck were cleaned with a skin antiseptic and a sterile drape was placed. Maximal barrier sterile technique was utilized including caps, mask, sterile gowns, sterile gloves, sterile  drape, hand hygiene and skin antiseptic. The right neck was anesthetized with 1% lidocaine. Small incision was made in the right neck with a blade. Micropuncture set was placed in the right internal jugular vein with ultrasound guidance. The micropuncture wire was used for measurement purposes. The right chest was anesthetized with 1% lidocaine with epinephrine. #15 blade was used to make an incision and a subcutaneous port pocket was formed. [place] was assembled. Subcutaneous tunnel was formed with a stiff tunneling device. The port catheter was brought through the subcutaneous tunnel. The port was placed in the subcutaneous pocket. The micropuncture set was exchanged for a peel-away sheath. The catheter was placed through the peel-away sheath and the tip was placed in the central venous system. However, the catheter was felt to be too long. Catheter was pulled back and unfortunately the venous access was lost. Therefore, the right internal jugular vein was punctured a second time with ultrasound guidance and micropuncture dilator set was again placed. Catheter was tunneled through the subcutaneous tissues. Catheter was cut to an appropriate length. Catheter was advanced through the peel-away sheath and positioned in the lower SVC. Catheter placement was confirmed with fluoroscopy. The port was accessed and flushed with heparinized saline. The port pocket was closed using two layers of absorbable sutures and Dermabond. The vein skin site was closed using a single layer of absorbable suture and Dermabond.  Sterile dressings were applied. Patient tolerated the procedure well without an immediate complication. Ultrasound and fluoroscopic images were taken and saved for this procedure. IMPRESSION: Placement of a subcutaneous port device. Catheter tip in the lower SVC. Electronically Signed   By: Markus Daft M.D.   On: 04/26/2019 15:43

## 2019-05-09 NOTE — Telephone Encounter (Signed)
Notified of message below

## 2019-05-09 NOTE — Telephone Encounter (Signed)
-----   Message from Tish Men, MD sent at 05/09/2019  3:08 PM EST ----- Hi Shenita Trego,  Can you let Ms. Nedeau know that her potasium is a little low, and I have sent a prescription for potassium supplement to her pharmacy?Thanks. Webb  ----- Message ----- From: Buel Ream, [facility] [place] Sent: 05/09/2019   2:20 PM EST To: Tish Men, MD

## 2019-05-10 ENCOUNTER — Ambulatory Visit
Admission: RE | Admit: 2019-05-10 | Discharge: 2019-05-10 | Disposition: A | Discharge status: Home / Self Care | Payer: Medicare Other | Source: Ambulatory Visit | Attending: Radiation Oncology | Admitting: Radiation Oncology

## 2019-05-10 ENCOUNTER — Other Ambulatory Visit: Payer: Self-pay

## 2019-05-10 ENCOUNTER — Inpatient Hospital Stay: Payer: Medicare Other | Admitting: Nutrition

## 2019-05-10 ENCOUNTER — Inpatient Hospital Stay: Payer: Medicare Other

## 2019-05-10 VITALS — BP 118/59 | HR 92 | Temp 97.9°F | Resp 20

## 2019-05-10 DIAGNOSIS — E876 Hypokalemia: Secondary | ICD-10-CM | POA: Diagnosis not present

## 2019-05-10 DIAGNOSIS — C320 Malignant neoplasm of glottis: Secondary | ICD-10-CM

## 2019-05-10 DIAGNOSIS — Z5111 Encounter for antineoplastic chemotherapy: Secondary | ICD-10-CM | POA: Diagnosis not present

## 2019-05-10 DIAGNOSIS — Z79899 Other long term (current) drug therapy: Secondary | ICD-10-CM | POA: Diagnosis not present

## 2019-05-10 DIAGNOSIS — Z510 Encounter for antineoplastic radiation therapy: Secondary | ICD-10-CM | POA: Diagnosis not present

## 2019-05-10 MED ORDER — SODIUM CHLORIDE 0.9% FLUSH
10.0000 mL | INTRAVENOUS | Status: DC | PRN
  Administered 2019-05-10: 10 mL
  Filled 2019-05-10: qty 10

## 2019-05-10 MED ORDER — SODIUM CHLORIDE 0.9 % IV SOLN
Freq: Once | INTRAVENOUS | Status: AC
  Administered 2019-05-10: 09:00:00 via INTRAVENOUS
  Filled 2019-05-10: qty 250

## 2019-05-10 MED ORDER — POTASSIUM CHLORIDE 2 MEQ/ML IV SOLN
Freq: Once | INTRAVENOUS | Status: AC
  Administered 2019-05-10: 09:00:00 via INTRAVENOUS
  Filled 2019-05-10: qty 10

## 2019-05-10 MED ORDER — OSMOLITE 1.5 CAL PO LIQD: ORAL | 3 refills | Status: DC

## 2019-05-10 MED ORDER — PALONOSETRON HCL INJECTION 0.25 MG/5ML
0.2500 mg | Freq: Once | INTRAVENOUS | Status: AC
  Administered 2019-05-10: 0.25 mg via INTRAVENOUS

## 2019-05-10 MED ORDER — SODIUM CHLORIDE 0.9 % IV SOLN
40.0000 mg/m2 | Freq: Once | INTRAVENOUS | Status: AC
  Administered 2019-05-10: 62 mg via INTRAVENOUS
  Filled 2019-05-10: qty 62

## 2019-05-10 MED ORDER — HEPARIN SOD (PORK) LOCK FLUSH 100 UNIT/ML IV SOLN
500.0000 [IU] | Freq: Once | INTRAVENOUS | Status: AC | PRN
  Administered 2019-05-10: 500 [IU]
  Filled 2019-05-10: qty 5

## 2019-05-10 MED ORDER — PALONOSETRON HCL INJECTION 0.25 MG/5ML
INTRAVENOUS | Status: AC
  Filled 2019-05-10: qty 5

## 2019-05-10 MED ORDER — SODIUM CHLORIDE 0.9 % IV SOLN
Freq: Once | INTRAVENOUS | Status: AC
  Administered 2019-05-10: 12:00:00 via INTRAVENOUS
  Filled 2019-05-10: qty 5

## 2019-05-10 NOTE — Patient Instructions (Signed)
[facility] Discharge Instructions for Patients Receiving Chemotherapy  Today you received the following chemotherapy agents: Cisplatin  To help prevent nausea and vomiting after your treatment, we encourage you to take your nausea medication as directed by your MD.   If you develop nausea and vomiting that is not controlled by your nausea medication, call the clinic.   BELOW ARE SYMPTOMS THAT SHOULD BE REPORTED IMMEDIATELY:  *FEVER GREATER THAN 100.5 F  *CHILLS WITH OR WITHOUT FEVER  NAUSEA AND VOMITING THAT IS NOT CONTROLLED WITH YOUR NAUSEA MEDICATION  *UNUSUAL SHORTNESS OF BREATH  *UNUSUAL BRUISING OR BLEEDING  TENDERNESS IN MOUTH AND THROAT WITH OR WITHOUT PRESENCE OF ULCERS  *URINARY PROBLEMS  *BOWEL PROBLEMS  UNUSUAL RASH Items with * indicate a potential emergency and should be followed up as soon as possible.  Feel free to call the clinic should you have any questions or concerns. The clinic phone number is [phone].  Please show the [facility] at check-in to the Emergency Department and triage nurse. Cisplatin injection What is this medicine? CISPLATIN (SIS pla tin) is a chemotherapy drug. It targets fast dividing cells, like cancer cells, and causes these cells to die. This medicine is used to treat many types of cancer like bladder, ovarian, and testicular cancers. This medicine may be used for other purposes; ask your health care provider or pharmacist if you have questions. COMMON BRAND NAME(S): Platinol, Platinol -AQ What should I tell my health care provider before I take this medicine? They need to know if you have any of these conditions:  blood disorders  hearing problems  kidney disease  recent or ongoing radiation therapy  an unusual or allergic reaction to cisplatin, carboplatin, other chemotherapy, other medicines, foods, dyes, or preservatives  pregnant or trying to get pregnant  breast-feeding How should I use  this medicine? This drug is given as an infusion into a vein. It is administered in a hospital or clinic by a specially trained health care professional. Talk to your pediatrician regarding the use of this medicine in children. Special care may be needed. Overdosage: If you think you have taken too much of this medicine contact a poison control center or emergency room at once. NOTE: This medicine is only for you. Do not share this medicine with others. What if I miss a dose? It is important not to miss a dose. Call your doctor or health care professional if you are unable to keep an appointment. What may interact with this medicine?  dofetilide  foscarnet  medicines for seizures  medicines to increase blood counts like filgrastim, pegfilgrastim, sargramostim  probenecid  pyridoxine used with altretamine  rituximab  some antibiotics like amikacin, gentamicin, neomycin, polymyxin B, streptomycin, tobramycin  sulfinpyrazone  vaccines  zalcitabine Talk to your doctor or health care professional before taking any of these medicines:  acetaminophen  aspirin  ibuprofen  ketoprofen  naproxen This list may not describe all possible interactions. Give your health care provider a list of all the medicines, herbs, non-prescription drugs, or dietary supplements you use. Also tell them if you smoke, drink alcohol, or use illegal drugs. Some items may interact with your medicine. What should I watch for while using this medicine? Your condition will be monitored carefully while you are receiving this medicine. You will need important blood work done while you are taking this medicine. This drug may make you feel generally unwell. This is not uncommon, as chemotherapy can affect healthy cells as  well as cancer cells. Report any side effects. Continue your course of treatment even though you feel ill unless your doctor tells you to stop. In some cases, you may be given additional  medicines to help with side effects. Follow all directions for their use. Call your doctor or health care professional for advice if you get a fever, chills or sore throat, or other symptoms of a cold or flu. Do not treat yourself. This drug decreases your body's ability to fight infections. Try to avoid being around people who are sick. This medicine may increase your risk to bruise or bleed. Call your doctor or health care professional if you notice any unusual bleeding. Be careful brushing and flossing your teeth or using a toothpick because you may get an infection or bleed more easily. If you have any dental work done, tell your dentist you are receiving this medicine. Avoid taking products that contain aspirin, acetaminophen, ibuprofen, naproxen, or ketoprofen unless instructed by your doctor. These medicines may hide a fever. Do not become pregnant while taking this medicine. Women should inform their doctor if they wish to become pregnant or think they might be pregnant. There is a potential for serious side effects to an unborn child. Talk to your health care professional or pharmacist for more information. Do not breast-feed an infant while taking this medicine. Drink fluids as directed while you are taking this medicine. This will help protect your kidneys. Call your doctor or health care professional if you get diarrhea. Do not treat yourself. What side effects may I notice from receiving this medicine? Side effects that you should report to your doctor or health care professional as soon as possible:  allergic reactions like skin rash, itching or hives, swelling of the face, lips, or tongue  signs of infection - fever or chills, cough, sore throat, pain or difficulty passing urine  signs of decreased platelets or bleeding - bruising, pinpoint red spots on the skin, black, tarry stools, nosebleeds  signs of decreased red blood cells - unusually weak or tired, fainting spells,  lightheadedness  breathing problems  changes in hearing  gout pain  low blood counts - This drug may decrease the number of white blood cells, red blood cells and platelets. You may be at increased risk for infections and bleeding.  nausea and vomiting  pain, swelling, redness or irritation at the injection site  pain, tingling, numbness in the hands or feet  problems with balance, movement  trouble passing urine or change in the amount of urine Side effects that usually do not require medical attention (report to your doctor or health care professional if they continue or are bothersome):  changes in vision  loss of appetite  metallic taste in the mouth or changes in taste This list may not describe all possible side effects. Call your doctor for medical advice about side effects. You may report side effects to FDA at 1-800-FDA-1088. Where should I keep my medicine? This drug is given in a hospital or clinic and will not be stored at home. NOTE: This sheet is a summary. It may not cover all possible information. If you have questions about this medicine, talk to your doctor, pharmacist, or health care provider.  2020 Elsevier/Gold Standard (2007-09-12 14:40:54) Coronavirus (COVID-19) Are you at risk?  Are you at risk for the Coronavirus (COVID-19)?  To be considered HIGH RISK for Coronavirus (COVID-19), you have to meet the following criteria:  . Traveled to [place], [place], [place],  [place] or [place]; or in the [place] to [place], [place], [place], or [place]; and have fever, cough, and shortness of breath within the last 2 weeks of travel OR . Been in close contact with a person diagnosed with COVID-19 within the last 2 weeks and have fever, cough, and shortness of breath . IF YOU DO NOT MEET THESE CRITERIA, YOU ARE CONSIDERED LOW RISK FOR COVID-19.  What to do if you are HIGH RISK for COVID-19?  Marland Kitchen If you are having a medical emergency, call 911. . Seek  medical care right away. Before you go to a doctor's office, urgent care or emergency department, call ahead and tell them about your recent travel, contact with someone diagnosed with COVID-19, and your symptoms. You should receive instructions from your physician's office regarding next steps of care.  . When you arrive at healthcare provider, tell the healthcare staff immediately you have returned from visiting [place], [place], [place], [place] or [place]; or traveled in the [place] to [place], [place], [place], or [place]; in the last two weeks or you have been in close contact with a person diagnosed with COVID-19 in the last 2 weeks.   . Tell the health care staff about your symptoms: fever, cough and shortness of breath. . After you have been seen by a medical provider, you will be either: o Tested for (COVID-19) and discharged home on quarantine except to seek medical care if symptoms worsen, and asked to  - Stay home and avoid contact with others until you get your results (4-5 days)  - Avoid travel on public transportation if possible (such as bus, train, or airplane) or o Sent to the Emergency Department by EMS for evaluation, COVID-19 testing, and possible admission depending on your condition and test results.  What to do if you are LOW RISK for COVID-19?  Reduce your risk of any infection by using the same precautions used for avoiding the common cold or flu:  Marland Kitchen Wash your hands often with soap and warm water for at least 20 seconds.  If soap and water are not readily available, use an alcohol-based hand sanitizer with at least 60% alcohol.  . If coughing or sneezing, cover your mouth and nose by coughing or sneezing into the elbow areas of your shirt or coat, into a tissue or into your sleeve (not your hands). . Avoid shaking hands with others and consider head nods or verbal greetings only. . Avoid touching your eyes, nose, or mouth with unwashed hands.  . Avoid close  contact with people who are sick. . Avoid places or events with large numbers of people in one location, like concerts or sporting events. . Carefully consider travel plans you have or are making. . If you are planning any travel outside or inside the [place], visit the CDC's Travelers' Health webpage for the latest health notices. . If you have some symptoms but not all symptoms, continue to monitor at home and seek medical attention if your symptoms worsen. . If you are having a medical emergency, call 911.   [place] / e-Visit: [email or web address]         [facility]: [facility]: [id number]                   [facility]: [phone]

## 2019-05-10 NOTE — Progress Notes (Signed)
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  She is receiving concurrent chemoradiation therapy. Weight is stable and was documented as 116.9 pounds on November 18. Noted labs: Potassium 3.4 and glucose 101. Patient is status post PEG placement on November 5. Reports she eats 2 meals a day. She states she is ready to begin using her tube. Currently she is not having difficulty eating. She refuses oral nutrition supplements and she does not drink milk.  Estimated nutrition needs: 1630-1900 cal, 82-95 g protein, 2.0 L fluid.  Nutrition diagnosis: Predicted suboptimal energy intake continues.  Intervention: Educated patient to continue strategies for increased calorie and protein intake. Enforced importance of weight maintenance. Patient demonstrated how to give Osmolite 1.5 via PEG with water flushes. Educated patient on increasing tube feeding.   Goal rate for Osmolite 1.5 will be 5 bottles via PEG daily with 60 mL of free water before and after each bolus feeding 5 times daily,  30 mL Prosource mixed with 90 mL water once a day and 240 mL via PEG or by mouth 3 times daily between tube feeding. Tube feeding at goal rate provides 1875 cal, 85 g protein, 2255 mL free water which is 100% estimated nutrition needs. Questions were answered and teach back method used.  Monitoring, evaluation, goals: Patient will tolerate adequate calories and protein to minimize weight loss throughout treatment.  Next visit: Thursday, December 3 during infusion.  **Disclaimer: This note was dictated with voice recognition software. Similar sounding words can inadvertently be transcribed and this note may contain transcription errors which may not have been corrected upon publication of note.**

## 2019-05-11 ENCOUNTER — Ambulatory Visit
Admission: RE | Admit: 2019-05-11 | Discharge: 2019-05-11 | Disposition: A | Discharge status: Home / Self Care | Payer: Medicare Other | Source: Ambulatory Visit | Attending: Radiation Oncology | Admitting: Radiation Oncology

## 2019-05-11 ENCOUNTER — Other Ambulatory Visit: Payer: Self-pay

## 2019-05-11 DIAGNOSIS — Z510 Encounter for antineoplastic radiation therapy: Secondary | ICD-10-CM | POA: Diagnosis not present

## 2019-05-11 DIAGNOSIS — Z79899 Other long term (current) drug therapy: Secondary | ICD-10-CM | POA: Diagnosis not present

## 2019-05-11 DIAGNOSIS — C320 Malignant neoplasm of glottis: Secondary | ICD-10-CM | POA: Diagnosis not present

## 2019-05-12 DIAGNOSIS — C329 Malignant neoplasm of larynx, unspecified: Secondary | ICD-10-CM | POA: Diagnosis not present

## 2019-05-13 ENCOUNTER — Ambulatory Visit
Admission: RE | Admit: 2019-05-13 | Discharge: 2019-05-13 | Disposition: A | Discharge status: Home / Self Care | Payer: Medicare Other | Source: Ambulatory Visit | Attending: Radiation Oncology | Admitting: Radiation Oncology

## 2019-05-13 ENCOUNTER — Other Ambulatory Visit: Payer: Self-pay

## 2019-05-13 DIAGNOSIS — Z79899 Other long term (current) drug therapy: Secondary | ICD-10-CM | POA: Diagnosis not present

## 2019-05-13 DIAGNOSIS — Z510 Encounter for antineoplastic radiation therapy: Secondary | ICD-10-CM | POA: Diagnosis not present

## 2019-05-13 DIAGNOSIS — C320 Malignant neoplasm of glottis: Secondary | ICD-10-CM | POA: Diagnosis not present

## 2019-05-14 ENCOUNTER — Ambulatory Visit
Admission: RE | Admit: 2019-05-14 | Discharge: 2019-05-14 | Disposition: A | Discharge status: Home / Self Care | Payer: Medicare Other | Source: Ambulatory Visit | Attending: Radiation Oncology | Admitting: Radiation Oncology

## 2019-05-14 ENCOUNTER — Other Ambulatory Visit: Payer: Self-pay

## 2019-05-14 DIAGNOSIS — Z79899 Other long term (current) drug therapy: Secondary | ICD-10-CM | POA: Diagnosis not present

## 2019-05-14 DIAGNOSIS — C320 Malignant neoplasm of glottis: Secondary | ICD-10-CM | POA: Diagnosis not present

## 2019-05-14 DIAGNOSIS — Z510 Encounter for antineoplastic radiation therapy: Secondary | ICD-10-CM | POA: Diagnosis not present

## 2019-05-15 ENCOUNTER — Other Ambulatory Visit: Payer: Self-pay

## 2019-05-15 ENCOUNTER — Ambulatory Visit
Admission: RE | Admit: 2019-05-15 | Discharge: 2019-05-15 | Disposition: A | Discharge status: Home / Self Care | Payer: Medicare Other | Source: Ambulatory Visit | Attending: Radiation Oncology | Admitting: Radiation Oncology

## 2019-05-15 DIAGNOSIS — Z79899 Other long term (current) drug therapy: Secondary | ICD-10-CM | POA: Diagnosis not present

## 2019-05-15 DIAGNOSIS — Z510 Encounter for antineoplastic radiation therapy: Secondary | ICD-10-CM | POA: Diagnosis not present

## 2019-05-15 DIAGNOSIS — C320 Malignant neoplasm of glottis: Secondary | ICD-10-CM | POA: Diagnosis not present

## 2019-05-16 ENCOUNTER — Inpatient Hospital Stay: Payer: Medicare Other

## 2019-05-16 ENCOUNTER — Encounter: Payer: Self-pay | Admitting: Hematology

## 2019-05-16 ENCOUNTER — Inpatient Hospital Stay: Payer: Medicare Other | Admitting: Hematology

## 2019-05-16 ENCOUNTER — Other Ambulatory Visit: Payer: Self-pay

## 2019-05-16 ENCOUNTER — Ambulatory Visit
Admission: RE | Admit: 2019-05-16 | Discharge: 2019-05-16 | Disposition: A | Discharge status: Home / Self Care | Payer: Medicare Other | Source: Ambulatory Visit | Attending: Radiation Oncology | Admitting: Radiation Oncology

## 2019-05-16 VITALS — BP 125/73 | HR 95 | Temp 98.0°F | Resp 18 | Ht 63.0 in | Wt 116.2 lb

## 2019-05-16 DIAGNOSIS — D6481 Anemia due to antineoplastic chemotherapy: Secondary | ICD-10-CM | POA: Diagnosis not present

## 2019-05-16 DIAGNOSIS — C320 Malignant neoplasm of glottis: Secondary | ICD-10-CM

## 2019-05-16 DIAGNOSIS — Z5111 Encounter for antineoplastic chemotherapy: Secondary | ICD-10-CM | POA: Diagnosis not present

## 2019-05-16 DIAGNOSIS — D72829 Elevated white blood cell count, unspecified: Secondary | ICD-10-CM | POA: Diagnosis not present

## 2019-05-16 DIAGNOSIS — E46 Unspecified protein-calorie malnutrition: Secondary | ICD-10-CM

## 2019-05-16 DIAGNOSIS — T451X5A Adverse effect of antineoplastic and immunosuppressive drugs, initial encounter: Secondary | ICD-10-CM

## 2019-05-16 DIAGNOSIS — Z95828 Presence of other vascular implants and grafts: Secondary | ICD-10-CM

## 2019-05-16 DIAGNOSIS — K1231 Oral mucositis (ulcerative) due to antineoplastic therapy: Secondary | ICD-10-CM | POA: Diagnosis not present

## 2019-05-16 DIAGNOSIS — Z79899 Other long term (current) drug therapy: Secondary | ICD-10-CM | POA: Diagnosis not present

## 2019-05-16 DIAGNOSIS — G4700 Insomnia, unspecified: Secondary | ICD-10-CM

## 2019-05-16 DIAGNOSIS — E876 Hypokalemia: Secondary | ICD-10-CM | POA: Diagnosis not present

## 2019-05-16 DIAGNOSIS — Z510 Encounter for antineoplastic radiation therapy: Secondary | ICD-10-CM | POA: Diagnosis not present

## 2019-05-16 LAB — CBC WITH DIFFERENTIAL (CANCER CENTER ONLY)
Abs Immature Granulocytes: 0.06 10*3/uL (ref 0.00–0.07)
Basophils Absolute: 0 10*3/uL (ref 0.0–0.1)
Basophils Relative: 0 %
Eosinophils Absolute: 0 10*3/uL (ref 0.0–0.5)
Eosinophils Relative: 0 %
HCT: 32.1 % — ABNORMAL LOW (ref 36.0–46.0)
Hemoglobin: 10.5 g/dL — ABNORMAL LOW (ref 12.0–15.0)
Immature Granulocytes: 1 %
Lymphocytes Relative: 7 %
Lymphs Abs: 0.7 10*3/uL (ref 0.7–4.0)
MCH: 32 pg (ref 26.0–34.0)
MCHC: 32.7 g/dL (ref 30.0–36.0)
MCV: 97.9 fL (ref 80.0–100.0)
Monocytes Absolute: 0.6 10*3/uL (ref 0.1–1.0)
Monocytes Relative: 5 %
Neutro Abs: 9.6 10*3/uL — ABNORMAL HIGH (ref 1.7–7.7)
Neutrophils Relative %: 87 %
Platelet Count: 342 10*3/uL (ref 150–400)
RBC: 3.28 MIL/uL — ABNORMAL LOW (ref 3.87–5.11)
RDW: 14.2 % (ref 11.5–15.5)
WBC Count: 11 10*3/uL — ABNORMAL HIGH (ref 4.0–10.5)
nRBC: 0 % (ref 0.0–0.2)

## 2019-05-16 LAB — BASIC METABOLIC PANEL - CANCER CENTER ONLY
Anion gap: 11 (ref 5–15)
BUN: 19 mg/dL (ref 8–23)
CO2: 25 mmol/L (ref 22–32)
Calcium: 8.8 mg/dL — ABNORMAL LOW (ref 8.9–10.3)
Chloride: 101 mmol/L (ref 98–111)
Creatinine: 0.71 mg/dL (ref 0.44–1.00)
GFR, Est AFR Am: >60 mL/min (ref >60)
GFR, Estimated: >60 mL/min (ref >60)
Glucose, Bld: 81 mg/dL (ref 70–99)
Potassium: 4.4 mmol/L (ref 3.5–5.1)
Sodium: 137 mmol/L (ref 135–145)

## 2019-05-16 LAB — MAGNESIUM: Magnesium: 1.9 mg/dL (ref 1.7–2.4)

## 2019-05-16 MED ORDER — SODIUM CHLORIDE 0.9% FLUSH
10.0000 mL | INTRAVENOUS | Status: DC | PRN
  Administered 2019-05-16: 10 mL
  Filled 2019-05-16: qty 10

## 2019-05-16 MED ORDER — HEPARIN SOD (PORK) LOCK FLUSH 100 UNIT/ML IV SOLN
500.0000 [IU] | Freq: Once | INTRAVENOUS | Status: AC | PRN
  Administered 2019-05-16: 14:00:00 500 [IU]
  Filled 2019-05-16: qty 5

## 2019-05-16 MED ORDER — TRAZODONE HCL 50 MG PO TABS: 50.0000 mg | ORAL_TABLET | Freq: Every evening | ORAL | 4 refills | Status: DC | PRN

## 2019-05-16 NOTE — Progress Notes (Signed)
[facility] OFFICE PROGRESS NOTE  Patient Care Team: Charolette Forward, MD as PCP - General (Cardiology) Wyatt Portela, MD as Consulting Physician (Oncology) Charolette Forward, MD as Referring Physician (Cardiology) Eppie Gibson, MD as Attending Physician (Radiation Oncology) Leota Sauers, RN as Oncology Nurse Navigator  HEME/ONC OVERVIEW: 1. 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 Port and PEG in 04/2019   TREATMENT SUMMARY:  05/02/2019 - present: definitive chemoRT with weekly cisplatin   PERTINENT NON-HEM/ONC PROBLEMS: 1. 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 -S/p 2 cycles of weekly cisplatin concurrent with RT in the definitive setting  -Labs adequate today, proceed with Cycle 3 of chemotherapy -Plan for 7 doses  -PRN anti-emetics: Zofran, Compazine, Ativan and dexamethasone  Chemotherapy-associated anemia -Secondary to chemotherapy, as well as hx of methotrexate for rheumatoid arthritis  -Hgb 10.5, slightly lower than the last visit  -Patient denies any symptom of bleeding -We will monitor for now  Leukocytosis -Likely reactive in the setting of underlying malignancy, as well as steroid for delayed chemotherapy-associated nausea  -WBC 11.0k today, stable -Patient denies any symptoms of infection -We will monitor it for now   Hypokalemia -Likely due to electrolyte loss from chemotherapy -K 4.4 today, improving -Due to mucositis, she has not been taking KCl for the past a few days -As her K is now normal, I  informed the patient that she can hold off resuming KCl -We will monitor it weekly and determine if/when she may need to restart KCl supplement   Treatment-associated mucositis -Secondary to chemoradiation -Patient has been prescribed viscous lidocaine swish/swallow and IR liquid morphine, but has only been taking them sparingly -I reviewed in detail with the patient the appropriate administration of the medications, including viscous lidocaine before meals and medications, as well as IR morphine for moderate or severe pain  -I also encouraged her to use salt/soda rinses as frequently as she would like to, which will reduce the thickness of secretions   Protein malnutrition -Secondary to chemoradiation -Weight stable since the last visit  -He is currently taking 2-3 Omsolite per day via the PEG, as well as modest oral intake  -I encouraged the patient to increase tube feeding if her oral intake continues to decrease, and to adhere to nutritional recommendations   Insomnia -Patient reports chronic insomnia, and has noticed some improvement with Ativan at night -I counseled the patient on the risk of sleep dependence with Ativan, and recommended her to use it sparingly -I have prescribed trazodone 50 mg qhs PRN   No orders of the defined types were placed in this encounter.  All questions were answered. The patient knows to call the clinic with any problems, questions or concerns. No barriers to learning was detected.  Return in 1 week for labs, port flush and chemotherapy.   Tish Men, MD 05/16/2019 2:31 PM  CHIEF COMPLAINT: "My throat is really bothering me"  INTERVAL HISTORY: Ms. Bessinger returns clinic for follow-up of squamous cell carcinoma of the left vocal cord on definitive chemoradiation.  Patient reports that starting Monday, she has  had progressive pain with swallowing, for which she has been taking viscous lidocaine and IR morphine very sparingly.  Due to the difficulty and  pain with swallowing, she has been eating less, and started her tube feeding on Monday.  She is currently taking 2-3 Osmolite per day via the feeding tube, as well as approximately 60 ounces of water via the feeding tube.  She has been taking Ativan 0.5 mg at bedtime, which helps with insomnia that she has had for many years.  Her bowel movement is soft.  She denies any other complaint today.  REVIEW OF SYSTEMS:   Constitutional: ( - ) fevers, ( - )  chills , ( - ) night sweats Eyes: ( - ) blurriness of vision, ( - ) double vision, ( - ) watery eyes Ears, nose, mouth, throat, and face: ( - ) mucositis, ( - ) sore throat Respiratory: ( - ) cough, ( - ) dyspnea, ( - ) wheezes Cardiovascular: ( - ) palpitation, ( - ) chest discomfort, ( - ) lower extremity swelling Gastrointestinal:  ( - ) nausea, ( - ) heartburn, ( - ) change in bowel habits Skin: ( - ) abnormal skin rashes Lymphatics: ( - ) new lymphadenopathy, ( - ) easy bruising Neurological: ( - ) numbness, ( - ) tingling, ( - ) new weaknesses Behavioral/Psych: ( - ) mood change, ( - ) new changes  All other systems were reviewed with the patient and are negative.  SUMMARY OF ONCOLOGIC HISTORY: Oncology History  Squamous cell carcinoma of left vocal cord ([place])  03/26/2019 Pathology Results   DIAGNOSIS:   A. VOCAL CORD MASS, LEFT, EXCISION:  - Squamous cell carcinoma.  - See comment.    04/06/2019 Initial Diagnosis   Squamous cell carcinoma of left vocal cord ([place])   04/10/2019 Imaging   PET: IMPRESSION: 1. Left glottic lesion is hypermetabolic with maximum SUV 8.9, compatible with malignancy. No discrete adenopathy in the neck. 2. Extensive periarticular activity especially around the large joints, likely related to rheumatoid arthritis. 3. Small but hypermetabolic axillary and left external iliac lymph nodes are most likely reactive to the patient's rheumatoid arthritis, but merit surveillance. 4. Mild right hydronephrosis  without hydroureter, query right UPJ narrowing. Consider follow renal sonography. 5. Prominent but not hypermetabolic left adnexa, possibly due to prominent ovary or left eccentricity of the uterus, correlate with surgical history. This could be further investigated with pelvic sonography if clinically warranted. 6. Other imaging findings of potential clinical significance: Chronic left maxillary sinusitis. Aortic Atherosclerosis (ICD10-I70.0). Coronary atherosclerosis. Emphysema (ICD10-J43.9). Sigmoid colon diverticulosis.   04/10/2019 Imaging   CT neck w/ contrast: IMPRESSION: 1. Left glottic mass extending to but not beyond the anterior commissure. There is extension into the left paraglottic fat without cartilage erosion. 2. No adenopathy. 3. Aortic Atherosclerosis (ICD10-I70.0) and Emphysema (ICD10-J43.9).   04/11/2019 Cancer Staging   Staging form: Larynx - Glottis, AJCC 8th Edition - Clinical stage from 04/11/2019: Stage III (cT3, cN0, cM0) - Signed by Eppie Gibson, MD on 04/11/2019   05/03/2019 -  Chemotherapy   The patient had palonosetron (ALOXI) injection 0.25 mg, 0.25 mg, Intravenous,  Once, 2 of 7 cycles Administration: 0.25 mg (05/03/2019), 0.25 mg (05/10/2019) CISplatin (PLATINOL) 62 mg in sodium chloride 0.9 % 250 mL chemo infusion, 40 mg/m2 = 62 mg, Intravenous,  Once, 2 of 7 cycles Administration: 62 mg (05/03/2019), 62 mg (05/10/2019) fosaprepitant (EMEND) 150 mg, dexamethasone (DECADRON) 12 mg in sodium chloride 0.9 % 145 mL  IVPB, , Intravenous,  Once, 2 of 7 cycles Administration:  (05/03/2019),  (05/10/2019)  for chemotherapy treatment.      I have reviewed the past medical history, past surgical history, social history and family history with the patient and they are unchanged from previous note.  ALLERGIES:  is allergic to sulfa antibiotics.  MEDICATIONS:  Current Outpatient Medications  Medication Sig Dispense Refill  . Adalimumab (HUMIRA PEN) 40  MG/0.4ML PNKT Inject 40 mg into the skin every 14 (fourteen) days. (Patient taking differently: Inject 40 mg into the skin every 21 ( twenty-one) days. ) 3 each 0  . ALLERGIST TRAY 1CC 27GX1/2" 27G X 1/2" 1 ML KIT TO BE USED WITH WEEKLY METHOTREXATE INJECTIONS ONCE A WEEK (Patient not taking: Reported on 05/09/2019) 25 each 0  . amLODipine (NORVASC) 5 MG tablet Take 5 mg by mouth daily.  0  . Cholecalciferol (VITAMIN D) 50 MCG (2000 UT) CAPS Take 2,000 Units by mouth daily.     Marland Kitchen dexamethasone (DECADRON) 4 MG tablet Take 2 tablets by mouth once a day on the day after chemotherapy and then take 2 tablets two times a day for 2 days. Take with food. 30 tablet 1  . folic acid (FOLVITE) 1 MG tablet TAKE 2 TABLETS(2 MG) BY MOUTH DAILY (Patient taking differently: Take 2 mg by mouth daily. ) 180 tablet 3  . lidocaine (XYLOCAINE) 2 % solution Patient: Mix 1part 2% viscous lidocaine, 1part H20. Swish & swallow 30m of diluted mixture, 337m before meals and at bedtime, up to QID 100 mL 5  . lidocaine-prilocaine (EMLA) cream Apply to affected area once 30 g 3  . LORazepam (ATIVAN) 0.5 MG tablet Take 1 tablet (0.5 mg total) by mouth every 6 (six) hours as needed (Nausea or vomiting). 30 tablet 0  . losartan (COZAAR) 100 MG tablet Take 100 mg by mouth daily.   0  . methotrexate 50 MG/2ML injection INJECT 0.6 ML(15 MG TOTAL) UNDER THE SKIN ONCE A WEEK (Patient not taking: Reported on 05/09/2019) 8 mL 0  . Morphine Sulfate (MORPHINE CONCENTRATE) 10 mg / 0.5 ml concentrated solution Take 0.5 mLs (10 mg total) by mouth every 6 (six) hours as needed for severe pain. (Patient not taking: Reported on 05/02/2019) 120 mL 0  . Nutritional Supplements (FEEDING SUPPLEMENT, OSMOLITE 1.5 CAL,) LIQD Give 1 bottle Osmolite 1.5 via PEG with 60 mL free water before and after 5 times daily.  Give 30 mL Prosource or equivalent mixed with 90 mL of water via PEG and flush with 60 mL of water.  Drink or flush feeding tube with 240 mL of  free water 3 times daily between tube feedings. 1185 mL 3  . ondansetron (ZOFRAN) 4 MG tablet Take 1 tablet (4 mg total) by mouth every 6 (six) hours as needed for nausea or vomiting. (Patient not taking: Reported on 05/02/2019) 30 tablet 2  . ondansetron (ZOFRAN) 8 MG tablet Take 1 tablet (8 mg total) by mouth 2 (two) times daily as needed. Start on the third day after chemotherapy. 30 tablet 1  . potassium chloride SA (KLOR-CON) 20 MEQ tablet Take 1 tablet (20 mEq total) by mouth daily. 30 tablet 2  . prochlorperazine (COMPAZINE) 10 MG tablet Take 1 tablet (10 mg total) by mouth every 6 (six) hours as needed (Nausea or vomiting). 30 tablet 1  . sodium fluoride (PREVIDENT 5000 PLUS) 1.1 % CREA dental cream Apply to tooth brush. Brush teeth for 2 minutes. Spit out excess-DO NOT  swallow. DO NOT rinse afterwards. Repeat nightly. 1 Tube prn  . vitamin B-12 (CYANOCOBALAMIN) 1000 MCG tablet Take 1,000 mcg by mouth daily.     No current facility-administered medications for this visit.     PHYSICAL EXAMINATION: ECOG PERFORMANCE STATUS: 1 - Symptomatic but completely ambulatory  Today's Vitals   05/16/19 1421  BP: 125/73  Pulse: 95  Resp: 18  Temp: 98 F (36.7 C)  TempSrc: Temporal  SpO2: 100%  Weight: 116 lb 3.2 oz (52.7 kg)  Height: 5' 3" (1.6 m)   Body mass index is 20.58 kg/m.  Filed Weights   05/16/19 1421  Weight: 116 lb 3.2 oz (52.7 kg)    GENERAL: alert, no distress and comfortable SKIN: skin color, texture, turgor are normal, no rashes or significant lesions EYES: conjunctiva are pink and non-injected, sclera clear OROPHARYNX: no exudate, no erythema; thick oral secretions   NECK: supple, non-tender LYMPH:  no palpable lymphadenopathy in the cervical LUNGS: clear to auscultation with normal breathing effort HEART: regular rate & rhythm and no murmurs and no lower extremity edema ABDOMEN: soft, non-tender, non-distended, normal bowel sounds Musculoskeletal: no cyanosis of  digits and no clubbing  PSYCH: alert & oriented x 3, quiet speech  LABORATORY DATA:  I have reviewed the data as listed    Component Value Date/Time   NA 137 05/09/2019 1338   NA 141 03/05/2014 1050   K 3.4 (L) 05/09/2019 1338   K 3.9 03/05/2014 1050   CL 101 05/09/2019 1338   CO2 26 05/09/2019 1338   CO2 24 03/05/2014 1050   GLUCOSE 101 (H) 05/09/2019 1338   GLUCOSE 99 03/05/2014 1050   BUN 16 05/09/2019 1338   BUN 10.6 03/05/2014 1050   CREATININE 0.80 05/09/2019 1338   CREATININE 0.77 01/15/2019 0907   CREATININE 0.8 03/05/2014 1050   CALCIUM 8.5 (L) 05/09/2019 1338   CALCIUM 8.8 03/05/2014 1050   PROT 7.2 04/18/2019 0822   PROT 6.5 03/05/2014 1050   ALBUMIN 3.8 04/18/2019 0822   ALBUMIN 3.2 (L) 03/05/2014 1050   AST 19 04/18/2019 0822   AST 12 03/05/2014 1050   ALT 22 04/18/2019 0822   ALT 28 03/05/2014 1050   ALKPHOS 106 04/18/2019 0822   ALKPHOS 85 03/05/2014 1050   BILITOT 0.2 (L) 04/18/2019 0822   BILITOT 0.20 03/05/2014 1050   GFRNONAA >60 05/09/2019 1338   GFRNONAA 78 01/15/2019 0907   GFRAA >60 05/09/2019 1338   GFRAA 91 01/15/2019 0907    No results found for: SPEP, UPEP  Lab Results  Component Value Date   WBC 11.0 (H) 05/16/2019   NEUTROABS 9.6 (H) 05/16/2019   HGB 10.5 (L) 05/16/2019   HCT 32.1 (L) 05/16/2019   MCV 97.9 05/16/2019   PLT 342 05/16/2019      Chemistry      Component Value Date/Time   NA 137 05/09/2019 1338   NA 141 03/05/2014 1050   K 3.4 (L) 05/09/2019 1338   K 3.9 03/05/2014 1050   CL 101 05/09/2019 1338   CO2 26 05/09/2019 1338   CO2 24 03/05/2014 1050   BUN 16 05/09/2019 1338   BUN 10.6 03/05/2014 1050   CREATININE 0.80 05/09/2019 1338   CREATININE 0.77 01/15/2019 0907   CREATININE 0.8 03/05/2014 1050      Component Value Date/Time   CALCIUM 8.5 (L) 05/09/2019 1338   CALCIUM 8.8 03/05/2014 1050   ALKPHOS 106 04/18/2019 0822   ALKPHOS 85 03/05/2014 1050   AST 19 04/18/2019  6803   AST 12 03/05/2014 1050   ALT  22 04/18/2019 0822   ALT 28 03/05/2014 1050   BILITOT 0.2 (L) 04/18/2019 0822   BILITOT 0.20 03/05/2014 1050       RADIOGRAPHIC STUDIES: I have personally reviewed the radiological images as listed below and agreed with the findings in the report. Ir Gastrostomy Tube Mod Sed  Result Date: 04/26/2019 INDICATION: 73 year old with squamous cell carcinoma of the left vocal cord. Patient is scheduled for chemotherapy and radiation. EXAM: PERCUTANEOUS GASTROSTOMY TUBE WITH FLUOROSCOPIC GUIDANCE Physician: Stephan Minister. Anselm Pancoast, MD MEDICATIONS: Antibiotics were given prior to the Port-A-Cath which was performed immediately prior to this procedure. Glucagon 1 mg IV ANESTHESIA/SEDATION: Versed 1 mg IV; Fentanyl 37.5 mcg IV Moderate Sedation Time:  14 minutes The patient was continuously monitored during the procedure by the interventional radiology nurse under my direct supervision. FLUOROSCOPY TIME:  Fluoroscopy Time: 2 minutes 54 seconds (7 mGy). COMPLICATIONS: None immediate. PROCEDURE: The procedure was explained to the patient. The risks and benefits of the procedure were discussed and the patient's questions were addressed. Informed consent was obtained from the patient. The patient was placed on the interventional table. Fluoroscopy demonstrated oral contrast in the transverse colon. An orogastric tube was placed with fluoroscopic guidance. The anterior abdomen was prepped and draped in sterile fashion. Maximal barrier sterile technique was utilized including caps, mask, sterile gowns, sterile gloves, sterile drape, hand hygiene and skin antiseptic. Stomach was inflated with air through the orogastric tube. The skin and subcutaneous tissues were anesthetized with 1% lidocaine. A 17 gauge needle was directed into the distended stomach with fluoroscopic guidance. A wire was advanced into the stomach and a T-tact was deployed. A 9-French vascular sheath was placed and the orogastric tube was snared using a Goos[place]ck  snare device. The orogastric tube and snare were pulled out of the patient's mouth. The snare device was connected to a 20-French gastrostomy tube. The snare device and gastrostomy tube were pulled through the patient's mouth and out the anterior abdominal wall. The gastrostomy tube was cut to an appropriate length. Contrast injection through gastrostomy tube confirmed placement within the stomach. Fluoroscopic images were obtained for documentation. The gastrostomy tube was flushed with normal saline. IMPRESSION: Successful fluoroscopic guided percutaneous gastrostomy tube placement. Electronically Signed   By: Markus Daft M.D.   On: 04/26/2019 15:51   Ir Imaging Guided Port Insertion  Result Date: 04/26/2019 INDICATION: 73 year old with squamous cell carcinoma of the left focal cord. Port-A-Cath needed for chemotherapy. EXAM: FLUOROSCOPIC AND ULTRASOUND GUIDED PLACEMENT OF A SUBCUTANEOUS PORT COMPARISON:  None. MEDICATIONS: Ancef 2 g; The antibiotic was administered within an appropriate time interval prior to skin puncture. ANESTHESIA/SEDATION: Versed 1.5 mg IV; Fentanyl 62.5 mcg IV; Moderate Sedation Time:  36 The patient was continuously monitored during the procedure by the interventional radiology nurse under my direct supervision. FLUOROSCOPY TIME:  42 seconds, 1 mGy COMPLICATIONS: None immediate. PROCEDURE: The procedure, risks, benefits, and alternatives were explained to the patient. Questions regarding the procedure were encouraged and answered. The patient understands and consents to the procedure. Patient was placed supine on the interventional table. Ultrasound confirmed a patent right internal jugular vein. Ultrasound image was saved for documentation. The right chest and neck were cleaned with a skin antiseptic and a sterile drape was placed. Maximal barrier sterile technique was utilized including caps, mask, sterile gowns, sterile gloves, sterile drape, hand hygiene and skin antiseptic. The  right neck was anesthetized with 1% lidocaine. Small incision  was made in the right neck with a blade. Micropuncture set was placed in the right internal jugular vein with ultrasound guidance. The micropuncture wire was used for measurement purposes. The right chest was anesthetized with 1% lidocaine with epinephrine. #15 blade was used to make an incision and a subcutaneous port pocket was formed. [place] was assembled. Subcutaneous tunnel was formed with a stiff tunneling device. The port catheter was brought through the subcutaneous tunnel. The port was placed in the subcutaneous pocket. The micropuncture set was exchanged for a peel-away sheath. The catheter was placed through the peel-away sheath and the tip was placed in the central venous system. However, the catheter was felt to be too long. Catheter was pulled back and unfortunately the venous access was lost. Therefore, the right internal jugular vein was punctured a second time with ultrasound guidance and micropuncture dilator set was again placed. Catheter was tunneled through the subcutaneous tissues. Catheter was cut to an appropriate length. Catheter was advanced through the peel-away sheath and positioned in the lower SVC. Catheter placement was confirmed with fluoroscopy. The port was accessed and flushed with heparinized saline. The port pocket was closed using two layers of absorbable sutures and Dermabond. The vein skin site was closed using a single layer of absorbable suture and Dermabond. Sterile dressings were applied. Patient tolerated the procedure well without an immediate complication. Ultrasound and fluoroscopic images were taken and saved for this procedure. IMPRESSION: Placement of a subcutaneous port device. Catheter tip in the lower SVC. Electronically Signed   By: Markus Daft M.D.   On: 04/26/2019 15:43

## 2019-05-18 ENCOUNTER — Inpatient Hospital Stay: Payer: Medicare Other

## 2019-05-18 ENCOUNTER — Other Ambulatory Visit: Payer: Self-pay

## 2019-05-18 VITALS — BP 136/68 | HR 100 | Temp 98.5°F | Resp 18 | Ht 63.0 in

## 2019-05-18 DIAGNOSIS — C320 Malignant neoplasm of glottis: Secondary | ICD-10-CM

## 2019-05-18 DIAGNOSIS — Z5111 Encounter for antineoplastic chemotherapy: Secondary | ICD-10-CM | POA: Diagnosis not present

## 2019-05-18 DIAGNOSIS — E876 Hypokalemia: Secondary | ICD-10-CM | POA: Diagnosis not present

## 2019-05-18 MED ORDER — SODIUM CHLORIDE 0.9 % IV SOLN
Freq: Once | INTRAVENOUS | Status: AC
  Administered 2019-05-18: 09:00:00 via INTRAVENOUS
  Filled 2019-05-18: qty 250

## 2019-05-18 MED ORDER — SODIUM CHLORIDE 0.9 % IV SOLN
40.0000 mg/m2 | Freq: Once | INTRAVENOUS | Status: AC
  Administered 2019-05-18: 62 mg via INTRAVENOUS
  Filled 2019-05-18: qty 62

## 2019-05-18 MED ORDER — SODIUM CHLORIDE 0.9% FLUSH
10.0000 mL | INTRAVENOUS | Status: DC | PRN
  Administered 2019-05-18: 10 mL
  Filled 2019-05-18: qty 10

## 2019-05-18 MED ORDER — PALONOSETRON HCL INJECTION 0.25 MG/5ML
INTRAVENOUS | Status: AC
  Filled 2019-05-18: qty 5

## 2019-05-18 MED ORDER — POTASSIUM CHLORIDE 2 MEQ/ML IV SOLN
Freq: Once | INTRAVENOUS | Status: AC
  Administered 2019-05-18: 09:00:00 via INTRAVENOUS
  Filled 2019-05-18: qty 10

## 2019-05-18 MED ORDER — HEPARIN SOD (PORK) LOCK FLUSH 100 UNIT/ML IV SOLN
500.0000 [IU] | Freq: Once | INTRAVENOUS | Status: AC | PRN
  Administered 2019-05-18: 500 [IU]
  Filled 2019-05-18: qty 5

## 2019-05-18 MED ORDER — SODIUM CHLORIDE 0.9 % IV SOLN
Freq: Once | INTRAVENOUS | Status: AC
  Administered 2019-05-18: 12:00:00 via INTRAVENOUS
  Filled 2019-05-18: qty 5

## 2019-05-18 MED ORDER — PALONOSETRON HCL INJECTION 0.25 MG/5ML
0.2500 mg | Freq: Once | INTRAVENOUS | Status: AC
  Administered 2019-05-18: 11:00:00 0.25 mg via INTRAVENOUS

## 2019-05-18 NOTE — Patient Instructions (Signed)
[facility] Discharge Instructions for Patients Receiving Chemotherapy  Today you received the following chemotherapy agent: Cisplatin  To help prevent nausea and vomiting after your treatment, we encourage you to take your nausea medication as directed by your MD.   If you develop nausea and vomiting that is not controlled by your nausea medication, call the clinic.   BELOW ARE SYMPTOMS THAT SHOULD BE REPORTED IMMEDIATELY:  *FEVER GREATER THAN 100.5 F  *CHILLS WITH OR WITHOUT FEVER  NAUSEA AND VOMITING THAT IS NOT CONTROLLED WITH YOUR NAUSEA MEDICATION  *UNUSUAL SHORTNESS OF BREATH  *UNUSUAL BRUISING OR BLEEDING  TENDERNESS IN MOUTH AND THROAT WITH OR WITHOUT PRESENCE OF ULCERS  *URINARY PROBLEMS  *BOWEL PROBLEMS  UNUSUAL RASH Items with * indicate a potential emergency and should be followed up as soon as possible.  Feel free to call the clinic should you have any questions or concerns. The clinic phone number is [phone].  Please show the CHEMO ALERT CARD at check-in to the Emergency Department and triage nurse.   

## 2019-05-21 ENCOUNTER — Ambulatory Visit
Admission: RE | Admit: 2019-05-21 | Discharge: 2019-05-21 | Disposition: A | Discharge status: Home / Self Care | Payer: Medicare Other | Source: Ambulatory Visit | Attending: Radiation Oncology | Admitting: Radiation Oncology

## 2019-05-21 ENCOUNTER — Other Ambulatory Visit: Payer: Self-pay

## 2019-05-21 DIAGNOSIS — C320 Malignant neoplasm of glottis: Secondary | ICD-10-CM | POA: Diagnosis not present

## 2019-05-21 DIAGNOSIS — Z79899 Other long term (current) drug therapy: Secondary | ICD-10-CM | POA: Diagnosis not present

## 2019-05-21 DIAGNOSIS — Z510 Encounter for antineoplastic radiation therapy: Secondary | ICD-10-CM | POA: Diagnosis not present

## 2019-05-22 ENCOUNTER — Other Ambulatory Visit: Payer: Self-pay

## 2019-05-22 ENCOUNTER — Ambulatory Visit
Admission: RE | Admit: 2019-05-22 | Discharge: 2019-05-22 | Disposition: A | Discharge status: Home / Self Care | Payer: Medicare Other | Source: Ambulatory Visit | Attending: Radiation Oncology | Admitting: Radiation Oncology

## 2019-05-22 DIAGNOSIS — C320 Malignant neoplasm of glottis: Secondary | ICD-10-CM | POA: Diagnosis not present

## 2019-05-22 DIAGNOSIS — Z79899 Other long term (current) drug therapy: Secondary | ICD-10-CM | POA: Insufficient documentation

## 2019-05-22 DIAGNOSIS — Z510 Encounter for antineoplastic radiation therapy: Secondary | ICD-10-CM | POA: Diagnosis not present

## 2019-05-23 ENCOUNTER — Other Ambulatory Visit: Payer: Self-pay

## 2019-05-23 ENCOUNTER — Encounter: Payer: Self-pay | Admitting: Hematology

## 2019-05-23 ENCOUNTER — Inpatient Hospital Stay: Payer: Medicare Other | Attending: Hematology | Admitting: Hematology

## 2019-05-23 ENCOUNTER — Inpatient Hospital Stay: Payer: Medicare Other

## 2019-05-23 ENCOUNTER — Ambulatory Visit
Admission: RE | Admit: 2019-05-23 | Discharge: 2019-05-23 | Disposition: A | Discharge status: Home / Self Care | Payer: Medicare Other | Source: Ambulatory Visit | Attending: Radiation Oncology | Admitting: Radiation Oncology

## 2019-05-23 VITALS — BP 131/67 | HR 82 | Temp 98.9°F | Resp 18 | Ht 63.0 in | Wt 120.7 lb

## 2019-05-23 DIAGNOSIS — Z510 Encounter for antineoplastic radiation therapy: Secondary | ICD-10-CM | POA: Diagnosis not present

## 2019-05-23 DIAGNOSIS — Z79899 Other long term (current) drug therapy: Secondary | ICD-10-CM | POA: Diagnosis not present

## 2019-05-23 DIAGNOSIS — C320 Malignant neoplasm of glottis: Secondary | ICD-10-CM | POA: Insufficient documentation

## 2019-05-23 DIAGNOSIS — K1231 Oral mucositis (ulcerative) due to antineoplastic therapy: Secondary | ICD-10-CM | POA: Diagnosis not present

## 2019-05-23 DIAGNOSIS — D6481 Anemia due to antineoplastic chemotherapy: Secondary | ICD-10-CM | POA: Diagnosis not present

## 2019-05-23 DIAGNOSIS — Z5111 Encounter for antineoplastic chemotherapy: Secondary | ICD-10-CM | POA: Insufficient documentation

## 2019-05-23 DIAGNOSIS — T451X5A Adverse effect of antineoplastic and immunosuppressive drugs, initial encounter: Secondary | ICD-10-CM | POA: Diagnosis not present

## 2019-05-23 DIAGNOSIS — E46 Unspecified protein-calorie malnutrition: Secondary | ICD-10-CM | POA: Diagnosis not present

## 2019-05-23 DIAGNOSIS — Z95828 Presence of other vascular implants and grafts: Secondary | ICD-10-CM

## 2019-05-23 LAB — BASIC METABOLIC PANEL - CANCER CENTER ONLY
Anion gap: 9 (ref 5–15)
BUN: 26 mg/dL — ABNORMAL HIGH (ref 8–23)
CO2: 26 mmol/L (ref 22–32)
Calcium: 8.6 mg/dL — ABNORMAL LOW (ref 8.9–10.3)
Chloride: 102 mmol/L (ref 98–111)
Creatinine: 0.76 mg/dL (ref 0.44–1.00)
GFR, Est AFR Am: >60 mL/min (ref >60)
GFR, Estimated: >60 mL/min (ref >60)
Glucose, Bld: 80 mg/dL (ref 70–99)
Potassium: 4.2 mmol/L (ref 3.5–5.1)
Sodium: 137 mmol/L (ref 135–145)

## 2019-05-23 LAB — CBC WITH DIFFERENTIAL (CANCER CENTER ONLY)
Abs Immature Granulocytes: 0.02 10*3/uL (ref 0.00–0.07)
Basophils Absolute: 0 10*3/uL (ref 0.0–0.1)
Basophils Relative: 0 %
Eosinophils Absolute: 0 10*3/uL (ref 0.0–0.5)
Eosinophils Relative: 0 %
HCT: 28.9 % — ABNORMAL LOW (ref 36.0–46.0)
Hemoglobin: 9.3 g/dL — ABNORMAL LOW (ref 12.0–15.0)
Immature Granulocytes: 0 %
Lymphocytes Relative: 15 %
Lymphs Abs: 0.7 10*3/uL (ref 0.7–4.0)
MCH: 31.8 pg (ref 26.0–34.0)
MCHC: 32.2 g/dL (ref 30.0–36.0)
MCV: 99 fL (ref 80.0–100.0)
Monocytes Absolute: 0.3 10*3/uL (ref 0.1–1.0)
Monocytes Relative: 6 %
Neutro Abs: 3.9 10*3/uL (ref 1.7–7.7)
Neutrophils Relative %: 79 %
Platelet Count: 274 10*3/uL (ref 150–400)
RBC: 2.92 MIL/uL — ABNORMAL LOW (ref 3.87–5.11)
RDW: 14.9 % (ref 11.5–15.5)
WBC Count: 4.9 10*3/uL (ref 4.0–10.5)
nRBC: 0 % (ref 0.0–0.2)

## 2019-05-23 LAB — MAGNESIUM: Magnesium: 1.8 mg/dL (ref 1.7–2.4)

## 2019-05-23 MED ORDER — HEPARIN SOD (PORK) LOCK FLUSH 100 UNIT/ML IV SOLN
500.0000 [IU] | Freq: Once | INTRAVENOUS | Status: AC | PRN
  Administered 2019-05-23: 500 [IU]
  Filled 2019-05-23: qty 5

## 2019-05-23 MED ORDER — SODIUM CHLORIDE 0.9% FLUSH
10.0000 mL | INTRAVENOUS | Status: DC | PRN
  Administered 2019-05-23: 10 mL
  Filled 2019-05-23: qty 10

## 2019-05-23 NOTE — Progress Notes (Signed)
[facility] OFFICE PROGRESS NOTE  Patient Care Team: Charolette Forward, MD as PCP - General (Cardiology) Wyatt Portela, MD as Consulting Physician (Oncology) Charolette Forward, MD as Referring Physician (Cardiology) Eppie Gibson, MD as Attending Physician (Radiation Oncology) Leota Sauers, RN as Oncology Nurse Navigator  HEME/ONC OVERVIEW: 1. 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 Port and PEG in 04/2019   TREATMENT SUMMARY:  05/02/2019 - present: definitive chemoRT with weekly cisplatin   PERTINENT NON-HEM/ONC PROBLEMS: 1. 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 -S/p 3 cycles of weekly cisplatin concurrent with RT in the definitive setting  -Labs adequate today, proceed with Cycle 4 of chemotherapy -Plan for 7 doses  -PRN anti-emetics: Zofran, Compazine, Ativan and dexamethasone  Chemotherapy-associated anemia -Secondary to chemotherapy, as well as chronic bone marrow suppression due to hx of methotrexate for rheumatoid arthritis  -Hgb 9.3, lower than the last visit  -Patient denies any symptom of bleeding -We will monitor for now  Treatment-associated mucositis -Secondary to chemoradiation -Pain reasonably well controlled with PRN viscous lidocaine swish/swallow and IR liquid morphine -I encouraged the patient to use lidocaine first before meals and medications, and if no improvement, then she should take IR morphine as needed  Protein malnutrition -Secondary to chemoradiation -Weight modestly improving since the  last visit  -She is currently taking ~2 Omsolite per day via the PEG, as well as reasonable oral intake  -I encouraged the patient to increase tube feeding if her oral intake decreases, and to adhere to nutritional recommendations   No orders of the defined types were placed in this encounter.  All questions were answered. The patient knows to call the clinic with any problems, questions or concerns. No barriers to learning was detected.  Return in 1 week for labs, port flush, clinic appt and Cycle 5 of chemotherapy.   Tish Men, MD 05/23/2019 2:42 PM  CHIEF COMPLAINT: "I am doing better"  INTERVAL HISTORY: Ms. Looney returns clinic for follow-up of squamous cell carcinoma of the left vocal cord on definitive chemoradiation.  Patient reports that since the last visit, she had taken viscous lidocaine swish/swallow as well as liquid morphine approximately once a day over the Thanksgiving holiday for the throat soreness, and she was able to eat better and take her medications by mouth.  Over the past few days, her throat pain has improved, and that she has not had to take any medication for pain.  She is using her feeding tube regularly and takes at least 2 cans of Osmolite via the feeding tube per day.  Her oral intake is also improving.  She has gained a modest amount of weight since last visit.  She is also drinking at least 60 oz of fluids per day.  She denies any other complaint today.  REVIEW OF SYSTEMS:   Constitutional: ( - ) fevers, ( - )  chills , ( - ) night sweats Eyes: ( - ) blurriness of vision, ( - ) double vision, ( - ) watery eyes Ears, nose, mouth, throat, and face: ( + )  mucositis, ( + ) sore throat Respiratory: ( - ) cough, ( - ) dyspnea, ( - ) wheezes Cardiovascular: ( - ) palpitation, ( - ) chest discomfort, ( - ) lower extremity swelling Gastrointestinal:  ( - ) nausea, ( - ) heartburn, ( - ) change in bowel habits Skin: ( - ) abnormal skin rashes Lymphatics: ( - ) new  lymphadenopathy, ( - ) easy bruising Neurological: ( - ) numbness, ( - ) tingling, ( - ) new weaknesses Behavioral/Psych: ( - ) mood change, ( - ) new changes  All other systems were reviewed with the patient and are negative.  SUMMARY OF ONCOLOGIC HISTORY: Oncology History  Squamous cell carcinoma of left vocal cord ([place])  03/26/2019 Pathology Results   DIAGNOSIS:   A. VOCAL CORD MASS, LEFT, EXCISION:  - Squamous cell carcinoma.  - See comment.    04/06/2019 Initial Diagnosis   Squamous cell carcinoma of left vocal cord ([place])   04/10/2019 Imaging   PET: IMPRESSION: 1. Left glottic lesion is hypermetabolic with maximum SUV 8.9, compatible with malignancy. No discrete adenopathy in the neck. 2. Extensive periarticular activity especially around the large joints, likely related to rheumatoid arthritis. 3. Small but hypermetabolic axillary and left external iliac lymph nodes are most likely reactive to the patient's rheumatoid arthritis, but merit surveillance. 4. Mild right hydronephrosis without hydroureter, query right UPJ narrowing. Consider follow renal sonography. 5. Prominent but not hypermetabolic left adnexa, possibly due to prominent ovary or left eccentricity of the uterus, correlate with surgical history. This could be further investigated with pelvic sonography if clinically warranted. 6. Other imaging findings of potential clinical significance: Chronic left maxillary sinusitis. Aortic Atherosclerosis (ICD10-I70.0). Coronary atherosclerosis. Emphysema (ICD10-J43.9). Sigmoid colon diverticulosis.   04/10/2019 Imaging   CT neck w/ contrast: IMPRESSION: 1. Left glottic mass extending to but not beyond the anterior commissure. There is extension into the left paraglottic fat without cartilage erosion. 2. No adenopathy. 3. Aortic Atherosclerosis (ICD10-I70.0) and Emphysema (ICD10-J43.9).   04/11/2019 Cancer Staging   Staging form: Larynx - Glottis, AJCC 8th  Edition - Clinical stage from 04/11/2019: Stage III (cT3, cN0, cM0) - Signed by Eppie Gibson, MD on 04/11/2019   05/03/2019 -  Chemotherapy   The patient had palonosetron (ALOXI) injection 0.25 mg, 0.25 mg, Intravenous,  Once, 3 of 7 cycles Administration: 0.25 mg (05/03/2019), 0.25 mg (05/10/2019), 0.25 mg (05/18/2019) CISplatin (PLATINOL) 62 mg in sodium chloride 0.9 % 250 mL chemo infusion, 40 mg/m2 = 62 mg, Intravenous,  Once, 3 of 7 cycles Administration: 62 mg (05/03/2019), 62 mg (05/10/2019), 62 mg (05/18/2019) fosaprepitant (EMEND) 150 mg, dexamethasone (DECADRON) 12 mg in sodium chloride 0.9 % 145 mL IVPB, , Intravenous,  Once, 3 of 7 cycles Administration:  (05/03/2019),  (05/10/2019),  (05/18/2019)  for chemotherapy treatment.      I have reviewed the past medical history, past surgical history, social history and family history with the patient and they are unchanged from previous note.  ALLERGIES:  is allergic to sulfa antibiotics.  MEDICATIONS:  Current Outpatient Medications  Medication Sig Dispense Refill  . Adalimumab (HUMIRA PEN) 40 MG/0.4ML PNKT Inject 40 mg into the skin every 14 (fourteen) days. 3 each 0  . amLODipine (NORVASC) 5 MG tablet Take 5 mg by mouth daily.  0  . Cholecalciferol (VITAMIN D) 50 MCG (2000 UT) CAPS Take 2,000 Units by mouth daily.     . folic acid (FOLVITE) 1 MG tablet TAKE 2 TABLETS(2 MG) BY MOUTH DAILY 180  tablet 3  . lidocaine (XYLOCAINE) 2 % solution Patient: Mix 1part 2% viscous lidocaine, 1part H20. Swish & swallow 67m of diluted mixture, 313m before meals and at bedtime, up to QID 100 mL 5  . lidocaine-prilocaine (EMLA) cream Apply to affected area once 30 g 3  . losartan (COZAAR) 100 MG tablet Take 100 mg by mouth daily.   0  . Morphine Sulfate (MORPHINE CONCENTRATE) 10 mg / 0.5 ml concentrated solution Take 0.5 mLs (10 mg total) by mouth every 6 (six) hours as needed for severe pain. 120 mL 0  . Nutritional Supplements (FEEDING  SUPPLEMENT, OSMOLITE 1.5 CAL,) LIQD Give 1 bottle Osmolite 1.5 via PEG with 60 mL free water before and after 5 times daily.  Give 30 mL Prosource or equivalent mixed with 90 mL of water via PEG and flush with 60 mL of water.  Drink or flush feeding tube with 240 mL of free water 3 times daily between tube feedings. 1185 mL 3  . sodium fluoride (PREVIDENT 5000 PLUS) 1.1 % CREA dental cream Apply to tooth brush. Brush teeth for 2 minutes. Spit out excess-DO NOT swallow. DO NOT rinse afterwards. Repeat nightly. 1 Tube prn  . traZODone (DESYREL) 50 MG tablet Take 1 tablet (50 mg total) by mouth at bedtime as needed for sleep. 30 tablet 4  . vitamin B-12 (CYANOCOBALAMIN) 1000 MCG tablet Take 1,000 mcg by mouth daily.    . ALLERGIST TRAY 1CC 27GX1/2" 27G X 1/2" 1 ML KIT TO BE USED WITH WEEKLY METHOTREXATE INJECTIONS ONCE A WEEK (Patient not taking: Reported on 05/09/2019) 25 each 0  . dexamethasone (DECADRON) 4 MG tablet Take 2 tablets by mouth once a day on the day after chemotherapy and then take 2 tablets two times a day for 2 days. Take with food. (Patient not taking: Reported on 05/16/2019) 30 tablet 1  . LORazepam (ATIVAN) 0.5 MG tablet Take 1 tablet (0.5 mg total) by mouth every 6 (six) hours as needed (Nausea or vomiting). (Patient not taking: Reported on 05/16/2019) 30 tablet 0  . methotrexate 50 MG/2ML injection INJECT 0.6 ML(15 MG TOTAL) UNDER THE SKIN ONCE A WEEK (Patient not taking: Reported on 05/09/2019) 8 mL 0  . ondansetron (ZOFRAN) 8 MG tablet Take 1 tablet (8 mg total) by mouth 2 (two) times daily as needed. Start on the third day after chemotherapy. (Patient not taking: Reported on 05/16/2019) 30 tablet 1  . potassium chloride SA (KLOR-CON) 20 MEQ tablet Take 1 tablet (20 mEq total) by mouth daily. (Patient not taking: Reported on 05/16/2019) 30 tablet 2  . prochlorperazine (COMPAZINE) 10 MG tablet Take 1 tablet (10 mg total) by mouth every 6 (six) hours as needed (Nausea or vomiting).  (Patient not taking: Reported on 05/16/2019) 30 tablet 1   No current facility-administered medications for this visit.     PHYSICAL EXAMINATION: ECOG PERFORMANCE STATUS: 1 - Symptomatic but completely ambulatory  Today's Vitals   05/23/19 1406 05/23/19 1411  BP: 131/67   Pulse: 82   Resp: 18   Temp: 98.9 F (37.2 C)   TempSrc: Temporal   SpO2: 100%   Weight: 120 lb 11.2 oz (54.7 kg)   Height: _0  (1.6 m)   PainSc:  0-No pain   Body mass index is 21.38 kg/m.  Filed Weights   05/23/19 1406  Weight: 120 lb 11.2 oz (54.7 kg)    GENERAL: alert, no distress and comfortable SKIN: skin color, texture, turgor are normal, no rashes or  significant lesions EYES: conjunctiva are pink and non-injected, sclera clear OROPHARYNX: no exudate, no erythema; lips, buccal mucosa, and tongue normal  NECK: supple, non-tender LYMPH:  no palpable lymphadenopathy in the cervical LUNGS: clear to auscultation with normal breathing effort HEART: regular rate & rhythm and no murmurs and no lower extremity edema ABDOMEN: soft, non-tender, non-distended, normal bowel sounds Musculoskeletal: no cyanosis of digits and no clubbing  PSYCH: alert & oriented x 3, slightly quiet speech  LABORATORY DATA:  I have reviewed the data as listed    Component Value Date/Time   NA 137 05/23/2019 1348   NA 141 03/05/2014 1050   K 4.2 05/23/2019 1348   K 3.9 03/05/2014 1050   CL 102 05/23/2019 1348   CO2 26 05/23/2019 1348   CO2 24 03/05/2014 1050   GLUCOSE 80 05/23/2019 1348   GLUCOSE 99 03/05/2014 1050   BUN 26 (H) 05/23/2019 1348   BUN 10.6 03/05/2014 1050   CREATININE 0.76 05/23/2019 1348   CREATININE 0.77 01/15/2019 0907   CREATININE 0.8 03/05/2014 1050   CALCIUM 8.6 (L) 05/23/2019 1348   CALCIUM 8.8 03/05/2014 1050   PROT 7.2 04/18/2019 0822   PROT 6.5 03/05/2014 1050   ALBUMIN 3.8 04/18/2019 0822   ALBUMIN 3.2 (L) 03/05/2014 1050   AST 19 04/18/2019 0822   AST 12 03/05/2014 1050   ALT 22  04/18/2019 0822   ALT 28 03/05/2014 1050   ALKPHOS 106 04/18/2019 0822   ALKPHOS 85 03/05/2014 1050   BILITOT 0.2 (L) 04/18/2019 0822   BILITOT 0.20 03/05/2014 1050   GFRNONAA >60 05/23/2019 1348   GFRNONAA 78 01/15/2019 0907   GFRAA >60 05/23/2019 1348   GFRAA 91 01/15/2019 0907    No results found for: SPEP, UPEP  Lab Results  Component Value Date   WBC 4.9 05/23/2019   NEUTROABS 3.9 05/23/2019   HGB 9.3 (L) 05/23/2019   HCT 28.9 (L) 05/23/2019   MCV 99.0 05/23/2019   PLT 274 05/23/2019      Chemistry      Component Value Date/Time   NA 137 05/23/2019 1348   NA 141 03/05/2014 1050   K 4.2 05/23/2019 1348   K 3.9 03/05/2014 1050   CL 102 05/23/2019 1348   CO2 26 05/23/2019 1348   CO2 24 03/05/2014 1050   BUN 26 (H) 05/23/2019 1348   BUN 10.6 03/05/2014 1050   CREATININE 0.76 05/23/2019 1348   CREATININE 0.77 01/15/2019 0907   CREATININE 0.8 03/05/2014 1050      Component Value Date/Time   CALCIUM 8.6 (L) 05/23/2019 1348   CALCIUM 8.8 03/05/2014 1050   ALKPHOS 106 04/18/2019 0822   ALKPHOS 85 03/05/2014 1050   AST 19 04/18/2019 0822   AST 12 03/05/2014 1050   ALT 22 04/18/2019 0822   ALT 28 03/05/2014 1050   BILITOT 0.2 (L) 04/18/2019 0822   BILITOT 0.20 03/05/2014 1050       RADIOGRAPHIC STUDIES: I have personally reviewed the radiological images as listed below and agreed with the findings in the report. Ir Gastrostomy Tube Mod Sed  Result Date: 04/26/2019 INDICATION: 73 year old with squamous cell carcinoma of the left vocal cord. Patient is scheduled for chemotherapy and radiation. EXAM: PERCUTANEOUS GASTROSTOMY TUBE WITH FLUOROSCOPIC GUIDANCE Physician: Stephan Minister. Anselm Pancoast, MD MEDICATIONS: Antibiotics were given prior to the Port-A-Cath which was performed immediately prior to this procedure. Glucagon 1 mg IV ANESTHESIA/SEDATION: Versed 1 mg IV; Fentanyl 37.5 mcg IV Moderate Sedation Time:  14 minutes The patient  was continuously monitored during the procedure  by the interventional radiology nurse under my direct supervision. FLUOROSCOPY TIME:  Fluoroscopy Time: 2 minutes 54 seconds (7 mGy). COMPLICATIONS: None immediate. PROCEDURE: The procedure was explained to the patient. The risks and benefits of the procedure were discussed and the patient's questions were addressed. Informed consent was obtained from the patient. The patient was placed on the interventional table. Fluoroscopy demonstrated oral contrast in the transverse colon. An orogastric tube was placed with fluoroscopic guidance. The anterior abdomen was prepped and draped in sterile fashion. Maximal barrier sterile technique was utilized including caps, mask, sterile gowns, sterile gloves, sterile drape, hand hygiene and skin antiseptic. Stomach was inflated with air through the orogastric tube. The skin and subcutaneous tissues were anesthetized with 1% lidocaine. A 17 gauge needle was directed into the distended stomach with fluoroscopic guidance. A wire was advanced into the stomach and a T-tact was deployed. A 9-French vascular sheath was placed and the orogastric tube was snared using a [place]ck snare device. The orogastric tube and snare were pulled out of the patient's mouth. The snare device was connected to a 20-French gastrostomy tube. The snare device and gastrostomy tube were pulled through the patient's mouth and out the anterior abdominal wall. The gastrostomy tube was cut to an appropriate length. Contrast injection through gastrostomy tube confirmed placement within the stomach. Fluoroscopic images were obtained for documentation. The gastrostomy tube was flushed with normal saline. IMPRESSION: Successful fluoroscopic guided percutaneous gastrostomy tube placement. Electronically Signed   By: Markus Daft M.D.   On: 04/26/2019 15:51   Ir Imaging Guided Port Insertion  Result Date: 04/26/2019 INDICATION: 73 year old with squamous cell carcinoma of the left focal cord. Port-A-Cath needed for  chemotherapy. EXAM: FLUOROSCOPIC AND ULTRASOUND GUIDED PLACEMENT OF A SUBCUTANEOUS PORT COMPARISON:  None. MEDICATIONS: Ancef 2 g; The antibiotic was administered within an appropriate time interval prior to skin puncture. ANESTHESIA/SEDATION: Versed 1.5 mg IV; Fentanyl 62.5 mcg IV; Moderate Sedation Time:  36 The patient was continuously monitored during the procedure by the interventional radiology nurse under my direct supervision. FLUOROSCOPY TIME:  42 seconds, 1 mGy COMPLICATIONS: None immediate. PROCEDURE: The procedure, risks, benefits, and alternatives were explained to the patient. Questions regarding the procedure were encouraged and answered. The patient understands and consents to the procedure. Patient was placed supine on the interventional table. Ultrasound confirmed a patent right internal jugular vein. Ultrasound image was saved for documentation. The right chest and neck were cleaned with a skin antiseptic and a sterile drape was placed. Maximal barrier sterile technique was utilized including caps, mask, sterile gowns, sterile gloves, sterile drape, hand hygiene and skin antiseptic. The right neck was anesthetized with 1% lidocaine. Small incision was made in the right neck with a blade. Micropuncture set was placed in the right internal jugular vein with ultrasound guidance. The micropuncture wire was used for measurement purposes. The right chest was anesthetized with 1% lidocaine with epinephrine. #15 blade was used to make an incision and a subcutaneous port pocket was formed. [place] was assembled. Subcutaneous tunnel was formed with a stiff tunneling device. The port catheter was brought through the subcutaneous tunnel. The port was placed in the subcutaneous pocket. The micropuncture set was exchanged for a peel-away sheath. The catheter was placed through the peel-away sheath and the tip was placed in the central venous system. However, the catheter was felt to be too long.  Catheter was pulled back and unfortunately the venous access was lost.  Therefore, the right internal jugular vein was punctured a second time with ultrasound guidance and micropuncture dilator set was again placed. Catheter was tunneled through the subcutaneous tissues. Catheter was cut to an appropriate length. Catheter was advanced through the peel-away sheath and positioned in the lower SVC. Catheter placement was confirmed with fluoroscopy. The port was accessed and flushed with heparinized saline. The port pocket was closed using two layers of absorbable sutures and Dermabond. The vein skin site was closed using a single layer of absorbable suture and Dermabond. Sterile dressings were applied. Patient tolerated the procedure well without an immediate complication. Ultrasound and fluoroscopic images were taken and saved for this procedure. IMPRESSION: Placement of a subcutaneous port device. Catheter tip in the lower SVC. Electronically Signed   By: Markus Daft M.D.   On: 04/26/2019 15:43

## 2019-05-24 ENCOUNTER — Ambulatory Visit
Admission: RE | Admit: 2019-05-24 | Discharge: 2019-05-24 | Disposition: A | Discharge status: Home / Self Care | Payer: Medicare Other | Source: Ambulatory Visit | Attending: Radiation Oncology | Admitting: Radiation Oncology

## 2019-05-24 ENCOUNTER — Other Ambulatory Visit: Payer: Self-pay | Admitting: Family Medicine

## 2019-05-24 ENCOUNTER — Other Ambulatory Visit: Payer: Self-pay

## 2019-05-24 ENCOUNTER — Inpatient Hospital Stay: Payer: Medicare Other

## 2019-05-24 ENCOUNTER — Inpatient Hospital Stay: Payer: Medicare Other | Admitting: Nutrition

## 2019-05-24 VITALS — BP 134/73 | HR 88 | Temp 99.9°F | Resp 16 | Wt 119.0 lb

## 2019-05-24 DIAGNOSIS — Z79899 Other long term (current) drug therapy: Secondary | ICD-10-CM | POA: Diagnosis not present

## 2019-05-24 DIAGNOSIS — Z1231 Encounter for screening mammogram for malignant neoplasm of breast: Secondary | ICD-10-CM

## 2019-05-24 DIAGNOSIS — Z5111 Encounter for antineoplastic chemotherapy: Secondary | ICD-10-CM | POA: Diagnosis not present

## 2019-05-24 DIAGNOSIS — C320 Malignant neoplasm of glottis: Secondary | ICD-10-CM | POA: Diagnosis not present

## 2019-05-24 DIAGNOSIS — Z510 Encounter for antineoplastic radiation therapy: Secondary | ICD-10-CM | POA: Diagnosis not present

## 2019-05-24 MED ORDER — PALONOSETRON HCL INJECTION 0.25 MG/5ML
INTRAVENOUS | Status: AC
  Filled 2019-05-24: qty 5

## 2019-05-24 MED ORDER — PALONOSETRON HCL INJECTION 0.25 MG/5ML
0.2500 mg | Freq: Once | INTRAVENOUS | Status: AC
  Administered 2019-05-24: 0.25 mg via INTRAVENOUS

## 2019-05-24 MED ORDER — SODIUM CHLORIDE 0.9 % IV SOLN
40.0000 mg/m2 | Freq: Once | INTRAVENOUS | Status: AC
  Administered 2019-05-24: 62 mg via INTRAVENOUS
  Filled 2019-05-24: qty 62

## 2019-05-24 MED ORDER — SODIUM CHLORIDE 0.9 % IV SOLN
Freq: Once | INTRAVENOUS | Status: AC
  Administered 2019-05-24: 12:00:00 via INTRAVENOUS
  Filled 2019-05-24: qty 5

## 2019-05-24 MED ORDER — POTASSIUM CHLORIDE 2 MEQ/ML IV SOLN
Freq: Once | INTRAVENOUS | Status: AC
  Administered 2019-05-24: 10:00:00 via INTRAVENOUS
  Filled 2019-05-24: qty 10

## 2019-05-24 MED ORDER — SODIUM CHLORIDE 0.9 % IV SOLN
Freq: Once | INTRAVENOUS | Status: AC
  Administered 2019-05-24: 09:00:00 via INTRAVENOUS
  Filled 2019-05-24: qty 250

## 2019-05-24 MED ORDER — HEPARIN SOD (PORK) LOCK FLUSH 100 UNIT/ML IV SOLN
500.0000 [IU] | Freq: Once | INTRAVENOUS | Status: AC | PRN
  Administered 2019-05-24: 500 [IU]
  Filled 2019-05-24: qty 5

## 2019-05-24 MED ORDER — SODIUM CHLORIDE 0.9% FLUSH
10.0000 mL | INTRAVENOUS | Status: DC | PRN
  Administered 2019-05-24: 10 mL
  Filled 2019-05-24: qty 10

## 2019-05-24 NOTE — Progress Notes (Signed)
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  She is receiving concurrent chemoradiation treatments. Weight improved and documented as 119 pounds on December 3 up from 116.9 pounds November 18. Labs were reviewed. Patient reports she is eating despite decreased appetite. She is infusing 1 bottle Osmolite 1.5 twice daily through her feeding tube.  Reports liquid protein was never delivered. Patient dislikes oral nutrition supplements. Patient denies nausea, vomiting, constipation and diarrhea.  Estimated nutrition needs: 1630-1900 cal, 82-95 g protein, 2.0 L fluid.  Nutrition diagnosis:  Predicted suboptimal energy intake has evolved into inadequate oral intake related to glottis cancer as evidenced by need for tube feeding.  Intervention: Patient to continue high-calorie, high-protein foods as tolerated. Continue Osmolite 1.5 twice daily and flush feeding tube with 60 mL free water before and after each bolus feeding. Increase to goal rate of 5 bottles Osmolite 1.5 as oral intake decreases. Will contact [employer] [facility] regarding liquid protein which was not delivered.  Tube feeding at goal rate provides 1875 cal, 85 g protein, 2255 mL free water.  Monitoring, evaluation, goals: Patient will tolerate adequate calories and protein to minimize weight loss.  Next visit: Thursday, December 10 during infusion.  **Disclaimer: This note was dictated with voice recognition software. Similar sounding words can inadvertently be transcribed and this note may contain transcription errors which may not have been corrected upon publication of note.**

## 2019-05-24 NOTE — Patient Instructions (Signed)
[facility] Discharge Instructions for Patients Receiving Chemotherapy  Today you received the following chemotherapy agent: Cisplatin  To help prevent nausea and vomiting after your treatment, we encourage you to take your nausea medication as directed by your MD.   If you develop nausea and vomiting that is not controlled by your nausea medication, call the clinic.   BELOW ARE SYMPTOMS THAT SHOULD BE REPORTED IMMEDIATELY:  *FEVER GREATER THAN 100.5 F  *CHILLS WITH OR WITHOUT FEVER  NAUSEA AND VOMITING THAT IS NOT CONTROLLED WITH YOUR NAUSEA MEDICATION  *UNUSUAL SHORTNESS OF BREATH  *UNUSUAL BRUISING OR BLEEDING  TENDERNESS IN MOUTH AND THROAT WITH OR WITHOUT PRESENCE OF ULCERS  *URINARY PROBLEMS  *BOWEL PROBLEMS  UNUSUAL RASH Items with * indicate a potential emergency and should be followed up as soon as possible.  Feel free to call the clinic should you have any questions or concerns. The clinic phone number is [phone].  Please show the CHEMO ALERT CARD at check-in to the Emergency Department and triage nurse.   

## 2019-05-25 ENCOUNTER — Other Ambulatory Visit: Payer: Self-pay

## 2019-05-25 ENCOUNTER — Ambulatory Visit
Admission: RE | Admit: 2019-05-25 | Discharge: 2019-05-25 | Disposition: A | Discharge status: Home / Self Care | Payer: Medicare Other | Source: Ambulatory Visit | Attending: Radiation Oncology | Admitting: Radiation Oncology

## 2019-05-25 DIAGNOSIS — Z79899 Other long term (current) drug therapy: Secondary | ICD-10-CM | POA: Diagnosis not present

## 2019-05-25 DIAGNOSIS — C329 Malignant neoplasm of larynx, unspecified: Secondary | ICD-10-CM | POA: Diagnosis not present

## 2019-05-25 DIAGNOSIS — C320 Malignant neoplasm of glottis: Secondary | ICD-10-CM | POA: Diagnosis not present

## 2019-05-25 DIAGNOSIS — Z510 Encounter for antineoplastic radiation therapy: Secondary | ICD-10-CM | POA: Diagnosis not present

## 2019-05-28 ENCOUNTER — Other Ambulatory Visit: Payer: Self-pay

## 2019-05-28 ENCOUNTER — Ambulatory Visit
Admission: RE | Admit: 2019-05-28 | Discharge: 2019-05-28 | Disposition: A | Discharge status: Home / Self Care | Payer: Medicare Other | Source: Ambulatory Visit | Attending: Radiation Oncology | Admitting: Radiation Oncology

## 2019-05-28 DIAGNOSIS — C320 Malignant neoplasm of glottis: Secondary | ICD-10-CM | POA: Diagnosis not present

## 2019-05-28 DIAGNOSIS — Z79899 Other long term (current) drug therapy: Secondary | ICD-10-CM | POA: Diagnosis not present

## 2019-05-28 DIAGNOSIS — Z510 Encounter for antineoplastic radiation therapy: Secondary | ICD-10-CM | POA: Diagnosis not present

## 2019-05-29 ENCOUNTER — Other Ambulatory Visit: Payer: Self-pay

## 2019-05-29 ENCOUNTER — Ambulatory Visit
Admission: RE | Admit: 2019-05-29 | Discharge: 2019-05-29 | Disposition: A | Discharge status: Home / Self Care | Payer: Medicare Other | Source: Ambulatory Visit | Attending: Radiation Oncology | Admitting: Radiation Oncology

## 2019-05-29 DIAGNOSIS — Z79899 Other long term (current) drug therapy: Secondary | ICD-10-CM | POA: Diagnosis not present

## 2019-05-29 DIAGNOSIS — C320 Malignant neoplasm of glottis: Secondary | ICD-10-CM | POA: Diagnosis not present

## 2019-05-29 DIAGNOSIS — Z510 Encounter for antineoplastic radiation therapy: Secondary | ICD-10-CM | POA: Diagnosis not present

## 2019-05-30 ENCOUNTER — Other Ambulatory Visit: Payer: Self-pay

## 2019-05-30 ENCOUNTER — Inpatient Hospital Stay: Payer: Medicare Other

## 2019-05-30 ENCOUNTER — Ambulatory Visit
Admission: RE | Admit: 2019-05-30 | Discharge: 2019-05-30 | Disposition: A | Discharge status: Home / Self Care | Payer: Medicare Other | Source: Ambulatory Visit | Attending: Radiation Oncology | Admitting: Radiation Oncology

## 2019-05-30 ENCOUNTER — Inpatient Hospital Stay (HOSPITAL_BASED_OUTPATIENT_CLINIC_OR_DEPARTMENT_OTHER): Payer: Medicare Other | Admitting: Hematology

## 2019-05-30 ENCOUNTER — Encounter: Payer: Self-pay | Admitting: Hematology

## 2019-05-30 VITALS — BP 146/72 | HR 91 | Temp 98.5°F | Resp 18 | Ht 63.0 in | Wt 121.1 lb

## 2019-05-30 DIAGNOSIS — K1231 Oral mucositis (ulcerative) due to antineoplastic therapy: Secondary | ICD-10-CM

## 2019-05-30 DIAGNOSIS — C320 Malignant neoplasm of glottis: Secondary | ICD-10-CM | POA: Diagnosis not present

## 2019-05-30 DIAGNOSIS — T451X5A Adverse effect of antineoplastic and immunosuppressive drugs, initial encounter: Secondary | ICD-10-CM | POA: Diagnosis not present

## 2019-05-30 DIAGNOSIS — Z79899 Other long term (current) drug therapy: Secondary | ICD-10-CM | POA: Diagnosis not present

## 2019-05-30 DIAGNOSIS — Z5111 Encounter for antineoplastic chemotherapy: Secondary | ICD-10-CM | POA: Diagnosis not present

## 2019-05-30 DIAGNOSIS — E46 Unspecified protein-calorie malnutrition: Secondary | ICD-10-CM | POA: Diagnosis not present

## 2019-05-30 DIAGNOSIS — D6481 Anemia due to antineoplastic chemotherapy: Secondary | ICD-10-CM

## 2019-05-30 DIAGNOSIS — Z95828 Presence of other vascular implants and grafts: Secondary | ICD-10-CM

## 2019-05-30 DIAGNOSIS — Z510 Encounter for antineoplastic radiation therapy: Secondary | ICD-10-CM | POA: Diagnosis not present

## 2019-05-30 LAB — BASIC METABOLIC PANEL - CANCER CENTER ONLY
Anion gap: 8 (ref 5–15)
BUN: 15 mg/dL (ref 8–23)
CO2: 25 mmol/L (ref 22–32)
Calcium: 7.9 mg/dL — ABNORMAL LOW (ref 8.9–10.3)
Chloride: 106 mmol/L (ref 98–111)
Creatinine: 0.69 mg/dL (ref 0.44–1.00)
GFR, Est AFR Am: >60 mL/min (ref >60)
GFR, Estimated: >60 mL/min (ref >60)
Glucose, Bld: 80 mg/dL (ref 70–99)
Potassium: 4.1 mmol/L (ref 3.5–5.1)
Sodium: 139 mmol/L (ref 135–145)

## 2019-05-30 LAB — CBC WITH DIFFERENTIAL (CANCER CENTER ONLY)
Abs Immature Granulocytes: 0.02 10*3/uL (ref 0.00–0.07)
Basophils Absolute: 0 10*3/uL (ref 0.0–0.1)
Basophils Relative: 0 %
Eosinophils Absolute: 0 10*3/uL (ref 0.0–0.5)
Eosinophils Relative: 1 %
HCT: 27 % — ABNORMAL LOW (ref 36.0–46.0)
Hemoglobin: 8.8 g/dL — ABNORMAL LOW (ref 12.0–15.0)
Immature Granulocytes: 1 %
Lymphocytes Relative: 11 %
Lymphs Abs: 0.3 10*3/uL — ABNORMAL LOW (ref 0.7–4.0)
MCH: 32.1 pg (ref 26.0–34.0)
MCHC: 32.6 g/dL (ref 30.0–36.0)
MCV: 98.5 fL (ref 80.0–100.0)
Monocytes Absolute: 0.2 10*3/uL (ref 0.1–1.0)
Monocytes Relative: 7 %
Neutro Abs: 2.5 10*3/uL (ref 1.7–7.7)
Neutrophils Relative %: 80 %
Platelet Count: 184 10*3/uL (ref 150–400)
RBC: 2.74 MIL/uL — ABNORMAL LOW (ref 3.87–5.11)
RDW: 16.1 % — ABNORMAL HIGH (ref 11.5–15.5)
WBC Count: 3 10*3/uL — ABNORMAL LOW (ref 4.0–10.5)
nRBC: 0 % (ref 0.0–0.2)

## 2019-05-30 LAB — MAGNESIUM: Magnesium: 1.8 mg/dL (ref 1.7–2.4)

## 2019-05-30 MED ORDER — SODIUM CHLORIDE 0.9% FLUSH
10.0000 mL | INTRAVENOUS | Status: DC | PRN
  Administered 2019-05-30: 10 mL
  Filled 2019-05-30: qty 10

## 2019-05-30 MED ORDER — HEPARIN SOD (PORK) LOCK FLUSH 100 UNIT/ML IV SOLN
500.0000 [IU] | Freq: Once | INTRAVENOUS | Status: AC | PRN
  Administered 2019-05-30: 14:00:00 500 [IU]
  Filled 2019-05-30: qty 5

## 2019-05-30 NOTE — Progress Notes (Signed)
[name] OFF[facility] PROGRESS NOTE  Patient Care Team: Charolette Forward, MD as PCP - General (Cardiology) Wyatt Portela, MD as Consulting Physician (Oncology) Charolette Forward, MD as Referring Physician (Cardiology) Eppie Gibson, MD as Attending Physician (Radiation Oncology) Leota Sauers, RN as Oncology Nurse Navigator  HEME/ONC OVERVIEW: 1. 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 Port and PEG in 04/2019   TREATMENT SUMMARY:  05/02/2019 - present: definitive chemoRT with weekly cisplatin   PERTINENT NON-HEM/ONC PROBLEMS: 1. 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 -S/p 4 cycles of weekly cisplatin concurrent with RT in the definitive setting  -Due to progressive anemia, I have reduced the dose of cisplatin from 40 to 73m/m2, starting with Cycle 5 of chemotherapy  -Labs adequate today, proceed with Cycle 5 of chemotherapy -Plan for 7 doses  -PRN anti-emetics: Zofran, Compazine, Ativan and dexamethasone  Chemotherapy-associated anemia -Secondary to chemotherapy, as well as chronic bone marrow suppression due to hx of methotrexate for rheumatoid arthritis  -Hgb 8.8, lower than the last visit  -Patient denies any symptom of bleeding -Due to the progressive anemia, I have reduced the cisplatin dose to 3103mm2, starting with Cycle 5 of chemotherapy  -On folic acid 52m25maily  -We will monitor it closely  Hypocalcemia -Ca 7.9 today, lower than the last visit -Patient is asymptomatic -I recommended the patient to start OTC  Ca-Vit D supplement BID  -We will monitor it closely  Treatment-associated mucositis -Secondary to chemoradiation -Patient uses viscous lidocaine swish/swallow for pain, which provides some relief, but she still has some persistent pain; however, she has been reluctant to take morphine unless she has severe pain -I counseled the patient on appropriate usage of opioid medication and encouraged her to take it as needed for moderate to severe pain that is not controlled with viscous lidocaine alone, so that she can maintain adequate oral intake -Patient expressed understanding and agreed with the plan  Protein malnutrition -Secondary to chemoradiation -Weight stable since the last visit  -She is currently taking ~2 Omsolite per day via the PEG, as well as reasonable oral intake  -I encouraged the patient to increase tube feeding if her oral intake decreases, and to adhere to nutritional recommendations  No orders of the defined types were placed in this encounter.  All questions were answered. The patient knows to call the clinic with any problems, questions or concerns. No barriers to learning was detected.  Return in 1 week for Cycle 6 of chemotherapy.  [name] 05/30/2019 3:02 PM  CHIEF COMPLAINT: "My throat is sore"  INTERVAL HISTORY: [facility] follow-up of squamous cell carcinoma of the vocal cord on definitive chemoradiation.  Patient reports that she has had mild bilateral ankle swelling without any associated pain, usually worse at night.  She also has been having more soreness in her throat, for which she takes viscous lidocaine swish/swallow with some improvement, but it takes some time for the viscous lidocaine to work, and she has had to take occasional morphine for pain with adequate relief.  However, she  has been reluctant to take morphine unless she has severe pain.  She also has occasional pain around the feeding tube site, but it is not frequent or  constant.  She is able to eat mostly soft food, and also takes 2 bottles of Osmolite and approximately 64 ounces of water via the feeding tube.  She denies any other complaint today.  REVIEW OF SYSTEMS:   Constitutional: ( - ) fevers, ( - )  chills , ( - ) night sweats Eyes: ( - ) blurriness of vision, ( - ) double vision, ( - ) watery eyes Ears, nose, mouth, throat, and face: ( + ) mucositis, ( + ) sore throat Respiratory: ( - ) cough, ( - ) dyspnea, ( - ) wheezes Cardiovascular: ( - ) palpitation, ( - ) chest discomfort, ( + ) lower extremity swelling Gastrointestinal:  ( - ) nausea, ( - ) heartburn, ( - ) change in bowel habits Skin: ( - ) abnormal skin rashes Lymphatics: ( - ) new lymphadenopathy, ( - ) easy bruising Neurological: ( - ) numbness, ( - ) tingling, ( - ) new weaknesses Behavioral/Psych: ( - ) mood change, ( - ) new changes  All other systems were reviewed with the patient and are negative.  SUMMARY OF ONCOLOGIC HISTORY: Oncology History  Squamous cell carcinoma of left vocal cord ([place])  03/26/2019 Pathology Results   DIAGNOSIS:   A. VOCAL CORD MASS, LEFT, EXCISION:  - Squamous cell carcinoma.  - See comment.    04/06/2019 Initial Diagnosis   Squamous cell carcinoma of left vocal cord ([place])   04/10/2019 Imaging   PET: IMPRESSION: 1. Left glottic lesion is hypermetabolic with maximum SUV 8.9, compatible with malignancy. No discrete adenopathy in the neck. 2. Extensive periarticular activity especially around the large joints, likely related to rheumatoid arthritis. 3. Small but hypermetabolic axillary and left external iliac lymph nodes are most likely reactive to the patient's rheumatoid arthritis, but merit surveillance. 4. Mild right hydronephrosis without hydroureter, query right UPJ narrowing. Consider follow renal sonography. 5. Prominent but not hypermetabolic left adnexa, possibly due to prominent ovary or left eccentricity of the uterus, correlate  with surgical history. This could be further investigated with pelvic sonography if clinically warranted. 6. Other imaging findings of potential clinical significance: Chronic left maxillary sinusitis. Aortic Atherosclerosis (ICD10-I70.0). Coronary atherosclerosis. Emphysema (ICD10-J43.9). Sigmoid colon diverticulosis.   04/10/2019 Imaging   CT neck w/ contrast: IMPRESSION: 1. Left glottic mass extending to but not beyond the anterior commissure. There is extension into the left paraglottic fat without cartilage erosion. 2. No adenopathy. 3. Aortic Atherosclerosis (ICD10-I70.0) and Emphysema (ICD10-J43.9).   04/11/2019 Cancer Staging   Staging form: Larynx - Glottis, AJCC 8th Edition - Clinical stage from 04/11/2019: Stage III (cT3, cN0, cM0) - Signed by Eppie Gibson, MD on 04/11/2019   05/03/2019 -  Chemotherapy   The patient had palonosetron (ALOXI) injection 0.25 mg, 0.25 mg, Intravenous,  Once, 4 of 7 cycles Administration: 0.25 mg (05/03/2019), 0.25 mg (05/10/2019), 0.25 mg (05/18/2019), 0.25 mg (05/24/2019) CISplatin (PLATINOL) 62 mg in sodium chloride 0.9 % 250 mL chemo infusion, 40 mg/m2 = 62 mg, Intravenous,  Once, 4 of 7 cycles Dose modification: 30 mg/m2 (original dose 40 mg/m2, Cycle 5, Reason: Dose not tolerated) Administration: 62 mg (05/03/2019), 62 mg (05/10/2019), 62 mg (05/18/2019), 62 mg (05/24/2019) fosaprepitant (EMEND) 150 mg, dexamethasone (DECADRON) 12 mg in sodium chloride 0.9 % 145 mL IVPB, , Intravenous,  Once, 4 of 7 cycles Administration:  (  05/03/2019),  (05/10/2019),  (05/18/2019),  (05/24/2019)  for chemotherapy treatment.      I have reviewed the past medical history, past surgical history, social history and family history with the patient and they are unchanged from previous note.  ALLERGIES:  is allergic to sulfa antibiotics.  MEDICATIONS:  Current Outpatient Medications  Medication Sig Dispense Refill  . Adalimumab (HUMIRA PEN) 40 MG/0.4ML  PNKT Inject 40 mg into the skin every 14 (fourteen) days. 3 each 0  . amLODipine (NORVASC) 5 MG tablet Take 5 mg by mouth daily.  0  . folic acid (FOLVITE) 1 MG tablet TAKE 2 TABLETS(2 MG) BY MOUTH DAILY 180 tablet 3  . lidocaine (XYLOCAINE) 2 % solution Patient: Mix 1part 2% viscous lidocaine, 1part H20. Swish & swallow 41m of diluted mixture, 385m before meals and at bedtime, up to QID 100 mL 5  . lidocaine-prilocaine (EMLA) cream Apply to affected area once 30 g 3  . losartan (COZAAR) 100 MG tablet Take 100 mg by mouth daily.   0  . Morphine Sulfate (MORPHINE CONCENTRATE) 10 mg / 0.5 ml concentrated solution Take 0.5 mLs (10 mg total) by mouth every 6 (six) hours as needed for severe pain. 120 mL 0  . Nutritional Supplements (FEEDING SUPPLEMENT, OSMOLITE 1.5 CAL,) LIQD Give 1 bottle Osmolite 1.5 via PEG with 60 mL free water before and after 5 times daily.  Give 30 mL Prosource or equivalent mixed with 90 mL of water via PEG and flush with 60 mL of water.  Drink or flush feeding tube with 240 mL of free water 3 times daily between tube feedings. 1185 mL 3  . sodium fluoride (PREVIDENT 5000 PLUS) 1.1 % CREA dental cream Apply to tooth brush. Brush teeth for 2 minutes. Spit out excess-DO NOT swallow. DO NOT rinse afterwards. Repeat nightly. 1 Tube prn  . traZODone (DESYREL) 50 MG tablet Take 1 tablet (50 mg total) by mouth at bedtime as needed for sleep. 30 tablet 4  . vitamin B-12 (CYANOCOBALAMIN) 1000 MCG tablet Take 1,000 mcg by mouth daily.    . ALLERGIST TRAY 1CC 27GX1/2" 27G X 1/2" 1 ML KIT TO BE USED WITH WEEKLY METHOTREXATE INJECTIONS ONCE A WEEK (Patient not taking: Reported on 05/09/2019) 25 each 0  . Cholecalciferol (VITAMIN D) 50 MCG (2000 UT) CAPS Take 2,000 Units by mouth daily.     . [place] Kitchenexamethasone (DECADRON) 4 MG tablet Take 2 tablets by mouth once a day on the day after chemotherapy and then take 2 tablets two times a day for 2 days. Take with food. (Patient not taking: Reported on  05/16/2019) 30 tablet 1  . LORazepam (ATIVAN) 0.5 MG tablet Take 1 tablet (0.5 mg total) by mouth every 6 (six) hours as needed (Nausea or vomiting). (Patient not taking: Reported on 05/16/2019) 30 tablet 0  . methotrexate 50 MG/2ML injection INJECT 0.6 ML(15 MG TOTAL) UNDER THE SKIN ONCE A WEEK (Patient not taking: Reported on 05/09/2019) 8 mL 0  . ondansetron (ZOFRAN) 8 MG tablet Take 1 tablet (8 mg total) by mouth 2 (two) times daily as needed. Start on the third day after chemotherapy. (Patient not taking: Reported on 05/16/2019) 30 tablet 1  . potassium chloride SA (KLOR-CON) 20 MEQ tablet Take 1 tablet (20 mEq total) by mouth daily. (Patient not taking: Reported on 05/16/2019) 30 tablet 2  . prochlorperazine (COMPAZINE) 10 MG tablet Take 1 tablet (10 mg total) by mouth every 6 (six) hours as needed (Nausea or vomiting). (Patient  not taking: Reported on 05/16/2019) 30 tablet 1   No current facility-administered medications for this visit.     PHYSICAL EXAMINATION: ECOG PERFORMANCE STATUS: 1 - Symptomatic but completely ambulatory  Today's Vitals   05/30/19 1425 05/30/19 1428  BP: (!) 146/72   Pulse: 91   Resp: 18   Temp: 98.5 F (36.9 C)   TempSrc: Temporal   SpO2: 100%   Weight: 121 lb 1.6 oz (54.9 kg)   Height: _0  (1.6 m)   PainSc:  5    Body mass index is 21.45 kg/m.  Filed Weights   05/30/19 1425  Weight: 121 lb 1.6 oz (54.9 kg)    GENERAL: alert, no distress and comfortable, thin SKIN: skin color, texture, turgor are normal, no rashes or significant lesions EYES: conjunctiva are pink and non-injected, sclera clear OROPHARYNX: no exudate, no erythema; lips, buccal mucosa, and tongue normal, some thick saliva noted   NECK: supple, non-tender LYMPH:  no palpable lymphadenopathy in the cervical LUNGS: clear to auscultation with normal breathing effort HEART: regular rate & rhythm and no murmurs and trace lower extremity edema around bilateral ankles  ABDOMEN: soft,  non-tender, non-distended, normal bowel sounds Musculoskeletal: no cyanosis of digits and no clubbing  PSYCH: alert & oriented x 3, fluent speech  LABORATORY DATA:  I have reviewed the data as listed    Component Value Date/Time   NA 139 05/30/2019 1328   NA 141 03/05/2014 1050   K 4.1 05/30/2019 1328   K 3.9 03/05/2014 1050   CL 106 05/30/2019 1328   CO2 25 05/30/2019 1328   CO2 24 03/05/2014 1050   GLUCOSE 80 05/30/2019 1328   GLUCOSE 99 03/05/2014 1050   BUN 15 05/30/2019 1328   BUN 10.6 03/05/2014 1050   CREATININE 0.69 05/30/2019 1328   CREATININE 0.77 01/15/2019 0907   CREATININE 0.8 03/05/2014 1050   CALCIUM 7.9 (L) 05/30/2019 1328   CALCIUM 8.8 03/05/2014 1050   PROT 7.2 04/18/2019 0822   PROT 6.5 03/05/2014 1050   ALBUMIN 3.8 04/18/2019 0822   ALBUMIN 3.2 (L) 03/05/2014 1050   AST 19 04/18/2019 0822   AST 12 03/05/2014 1050   ALT 22 04/18/2019 0822   ALT 28 03/05/2014 1050   ALKPHOS 106 04/18/2019 0822   ALKPHOS 85 03/05/2014 1050   BILITOT 0.2 (L) 04/18/2019 0822   BILITOT 0.20 03/05/2014 1050   GFRNONAA >60 05/30/2019 1328   GFRNONAA 78 01/15/2019 0907   GFRAA >60 05/30/2019 1328   GFRAA 91 01/15/2019 0907    No results found for: SPEP, UPEP  Lab Results  Component Value Date   WBC 3.0 (L) 05/30/2019   NEUTROABS 2.5 05/30/2019   HGB 8.8 (L) 05/30/2019   HCT 27.0 (L) 05/30/2019   MCV 98.5 05/30/2019   PLT 184 05/30/2019      Chemistry      Component Value Date/Time   NA 139 05/30/2019 1328   NA 141 03/05/2014 1050   K 4.1 05/30/2019 1328   K 3.9 03/05/2014 1050   CL 106 05/30/2019 1328   CO2 25 05/30/2019 1328   CO2 24 03/05/2014 1050   BUN 15 05/30/2019 1328   BUN 10.6 03/05/2014 1050   CREATININE 0.69 05/30/2019 1328   CREATININE 0.77 01/15/2019 0907   CREATININE 0.8 03/05/2014 1050      Component Value Date/Time   CALCIUM 7.9 (L) 05/30/2019 1328   CALCIUM 8.8 03/05/2014 1050   ALKPHOS 106 04/18/2019 0822   ALKPHOS 85 03/05/2014  1050   AST 19 04/18/2019 0822   AST 12 03/05/2014 1050   ALT 22 04/18/2019 0822   ALT 28 03/05/2014 1050   BILITOT 0.2 (L) 04/18/2019 0822   BILITOT 0.20 03/05/2014 1050       RADIOGRAPHIC STUDIES: I have personally reviewed the radiological images as listed below and agreed with the findings in the report. No results found.

## 2019-05-30 NOTE — Progress Notes (Signed)
Patient wanted to leave her port accessed for infusion tomorrow.  She does not want to be stuck again.

## 2019-05-30 NOTE — Patient Instructions (Signed)

## 2019-05-31 ENCOUNTER — Ambulatory Visit
Admission: RE | Admit: 2019-05-31 | Discharge: 2019-05-31 | Disposition: A | Discharge status: Home / Self Care | Payer: Medicare Other | Source: Ambulatory Visit | Attending: Radiation Oncology | Admitting: Radiation Oncology

## 2019-05-31 ENCOUNTER — Other Ambulatory Visit: Payer: Self-pay

## 2019-05-31 ENCOUNTER — Inpatient Hospital Stay: Payer: Medicare Other

## 2019-05-31 ENCOUNTER — Inpatient Hospital Stay: Payer: Medicare Other | Admitting: Nutrition

## 2019-05-31 VITALS — BP 152/72 | HR 80 | Temp 98.2°F | Resp 18

## 2019-05-31 DIAGNOSIS — Z5111 Encounter for antineoplastic chemotherapy: Secondary | ICD-10-CM | POA: Diagnosis not present

## 2019-05-31 DIAGNOSIS — C320 Malignant neoplasm of glottis: Secondary | ICD-10-CM

## 2019-05-31 DIAGNOSIS — Z79899 Other long term (current) drug therapy: Secondary | ICD-10-CM | POA: Diagnosis not present

## 2019-05-31 DIAGNOSIS — Z510 Encounter for antineoplastic radiation therapy: Secondary | ICD-10-CM | POA: Diagnosis not present

## 2019-05-31 MED ORDER — PALONOSETRON HCL INJECTION 0.25 MG/5ML
0.2500 mg | Freq: Once | INTRAVENOUS | Status: AC
  Administered 2019-05-31: 0.25 mg via INTRAVENOUS

## 2019-05-31 MED ORDER — POTASSIUM CHLORIDE 2 MEQ/ML IV SOLN
Freq: Once | INTRAVENOUS | Status: AC
  Administered 2019-05-31: 09:00:00 via INTRAVENOUS
  Filled 2019-05-31: qty 10

## 2019-05-31 MED ORDER — SODIUM CHLORIDE 0.9 % IV SOLN
Freq: Once | INTRAVENOUS | Status: AC
  Administered 2019-05-31: 09:00:00 via INTRAVENOUS
  Filled 2019-05-31: qty 250

## 2019-05-31 MED ORDER — SODIUM CHLORIDE 0.9 % IV SOLN
30.0000 mg/m2 | Freq: Once | INTRAVENOUS | Status: AC
  Administered 2019-05-31: 46 mg via INTRAVENOUS
  Filled 2019-05-31: qty 46

## 2019-05-31 MED ORDER — SODIUM CHLORIDE 0.9% FLUSH
10.0000 mL | INTRAVENOUS | Status: DC | PRN
  Administered 2019-05-31: 10 mL
  Filled 2019-05-31: qty 10

## 2019-05-31 MED ORDER — PALONOSETRON HCL INJECTION 0.25 MG/5ML
INTRAVENOUS | Status: AC
  Filled 2019-05-31: qty 5

## 2019-05-31 MED ORDER — SODIUM CHLORIDE 0.9 % IV SOLN
Freq: Once | INTRAVENOUS | Status: AC
  Administered 2019-05-31: 11:00:00 via INTRAVENOUS
  Filled 2019-05-31: qty 5

## 2019-05-31 MED ORDER — HEPARIN SOD (PORK) LOCK FLUSH 100 UNIT/ML IV SOLN
500.0000 [IU] | Freq: Once | INTRAVENOUS | Status: AC | PRN
  Administered 2019-05-31: 500 [IU]
  Filled 2019-05-31: qty 5

## 2019-05-31 NOTE — Progress Notes (Signed)
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  She is receiving concurrent chemoradiation treatments. Weight improved and was documented as 121.1 pounds December 9 increased from 119 pounds December 3. Labs reviewed. Reports she continues to eat by mouth despite decreased appetite. She has been tolerating 2 bottles Osmolite 1.5 daily. Reports drinking 64 ounces of water daily. Denies nausea, vomiting, constipation and diarrhea.  Estimated nutrition needs: 1630-1900 cal, 82-95 g protein, 2.0 L fluid.  Nutrition diagnosis: Inadequate oral intake continues.  Intervention: Increase Osmolite 1.5, 3 times daily daily via PEG. Continue soft foods as tolerated. Begin 30 mL Prosource once daily via tube Patient verbalized understanding.  Monitoring, evaluation, goals: Patient will tolerate adequate calories and protein to minimize weight loss and promote healing.  Next visit: Thursday, December 17 during infusion.  **Disclaimer: This note was dictated with voice recognition software. Similar sounding words can inadvertently be transcribed and this note may contain transcription errors which may not have been corrected upon publication of note.**

## 2019-06-01 ENCOUNTER — Ambulatory Visit
Admission: RE | Admit: 2019-06-01 | Discharge: 2019-06-01 | Disposition: A | Discharge status: Home / Self Care | Payer: Medicare Other | Source: Ambulatory Visit | Attending: Radiation Oncology | Admitting: Radiation Oncology

## 2019-06-01 ENCOUNTER — Other Ambulatory Visit: Payer: Self-pay

## 2019-06-01 DIAGNOSIS — Z79899 Other long term (current) drug therapy: Secondary | ICD-10-CM | POA: Diagnosis not present

## 2019-06-01 DIAGNOSIS — Z510 Encounter for antineoplastic radiation therapy: Secondary | ICD-10-CM | POA: Diagnosis not present

## 2019-06-01 DIAGNOSIS — C320 Malignant neoplasm of glottis: Secondary | ICD-10-CM | POA: Diagnosis not present

## 2019-06-04 ENCOUNTER — Ambulatory Visit
Admission: RE | Admit: 2019-06-04 | Discharge: 2019-06-04 | Disposition: A | Discharge status: Home / Self Care | Payer: Medicare Other | Source: Ambulatory Visit | Attending: Radiation Oncology | Admitting: Radiation Oncology

## 2019-06-04 ENCOUNTER — Other Ambulatory Visit: Payer: Self-pay

## 2019-06-04 DIAGNOSIS — M1990 Unspecified osteoarthritis, unspecified site: Secondary | ICD-10-CM | POA: Diagnosis not present

## 2019-06-04 DIAGNOSIS — E785 Hyperlipidemia, unspecified: Secondary | ICD-10-CM | POA: Diagnosis not present

## 2019-06-04 DIAGNOSIS — Z510 Encounter for antineoplastic radiation therapy: Secondary | ICD-10-CM | POA: Diagnosis not present

## 2019-06-04 DIAGNOSIS — C320 Malignant neoplasm of glottis: Secondary | ICD-10-CM | POA: Diagnosis not present

## 2019-06-04 DIAGNOSIS — I10 Essential (primary) hypertension: Secondary | ICD-10-CM | POA: Diagnosis not present

## 2019-06-04 DIAGNOSIS — Z79899 Other long term (current) drug therapy: Secondary | ICD-10-CM | POA: Diagnosis not present

## 2019-06-04 DIAGNOSIS — M069 Rheumatoid arthritis, unspecified: Secondary | ICD-10-CM | POA: Diagnosis not present

## 2019-06-05 ENCOUNTER — Ambulatory Visit
Admission: RE | Admit: 2019-06-05 | Discharge: 2019-06-05 | Disposition: A | Discharge status: Home / Self Care | Payer: Medicare Other | Source: Ambulatory Visit | Attending: Radiation Oncology | Admitting: Radiation Oncology

## 2019-06-05 ENCOUNTER — Other Ambulatory Visit: Payer: Self-pay

## 2019-06-05 DIAGNOSIS — C320 Malignant neoplasm of glottis: Secondary | ICD-10-CM | POA: Diagnosis not present

## 2019-06-05 DIAGNOSIS — Z79899 Other long term (current) drug therapy: Secondary | ICD-10-CM | POA: Diagnosis not present

## 2019-06-05 DIAGNOSIS — Z510 Encounter for antineoplastic radiation therapy: Secondary | ICD-10-CM | POA: Diagnosis not present

## 2019-06-06 ENCOUNTER — Encounter: Payer: Self-pay | Admitting: Hematology

## 2019-06-06 ENCOUNTER — Ambulatory Visit
Admission: RE | Admit: 2019-06-06 | Discharge: 2019-06-06 | Disposition: A | Discharge status: Home / Self Care | Payer: Medicare Other | Source: Ambulatory Visit | Attending: Radiation Oncology | Admitting: Radiation Oncology

## 2019-06-06 ENCOUNTER — Inpatient Hospital Stay: Payer: Medicare Other

## 2019-06-06 ENCOUNTER — Inpatient Hospital Stay (HOSPITAL_BASED_OUTPATIENT_CLINIC_OR_DEPARTMENT_OTHER): Payer: Medicare Other | Admitting: Hematology

## 2019-06-06 ENCOUNTER — Other Ambulatory Visit: Payer: Self-pay

## 2019-06-06 VITALS — BP 143/77 | HR 82 | Temp 98.5°F | Resp 16 | Ht 63.0 in | Wt 119.3 lb

## 2019-06-06 DIAGNOSIS — G620 Drug-induced polyneuropathy: Secondary | ICD-10-CM | POA: Insufficient documentation

## 2019-06-06 DIAGNOSIS — D701 Agranulocytosis secondary to cancer chemotherapy: Secondary | ICD-10-CM | POA: Insufficient documentation

## 2019-06-06 DIAGNOSIS — Z5111 Encounter for antineoplastic chemotherapy: Secondary | ICD-10-CM | POA: Diagnosis not present

## 2019-06-06 DIAGNOSIS — T451X5A Adverse effect of antineoplastic and immunosuppressive drugs, initial encounter: Secondary | ICD-10-CM

## 2019-06-06 DIAGNOSIS — Z510 Encounter for antineoplastic radiation therapy: Secondary | ICD-10-CM | POA: Diagnosis not present

## 2019-06-06 DIAGNOSIS — K1231 Oral mucositis (ulcerative) due to antineoplastic therapy: Secondary | ICD-10-CM

## 2019-06-06 DIAGNOSIS — Z95828 Presence of other vascular implants and grafts: Secondary | ICD-10-CM

## 2019-06-06 DIAGNOSIS — C320 Malignant neoplasm of glottis: Secondary | ICD-10-CM | POA: Diagnosis not present

## 2019-06-06 DIAGNOSIS — D6481 Anemia due to antineoplastic chemotherapy: Secondary | ICD-10-CM | POA: Diagnosis not present

## 2019-06-06 DIAGNOSIS — Z79899 Other long term (current) drug therapy: Secondary | ICD-10-CM | POA: Diagnosis not present

## 2019-06-06 DIAGNOSIS — E46 Unspecified protein-calorie malnutrition: Secondary | ICD-10-CM

## 2019-06-06 LAB — CBC WITH DIFFERENTIAL (CANCER CENTER ONLY)
Abs Immature Granulocytes: 0.02 10*3/uL (ref 0.00–0.07)
Basophils Absolute: 0 10*3/uL (ref 0.0–0.1)
Basophils Relative: 0 %
Eosinophils Absolute: 0 10*3/uL (ref 0.0–0.5)
Eosinophils Relative: 1 %
HCT: 28.4 % — ABNORMAL LOW (ref 36.0–46.0)
Hemoglobin: 9.3 g/dL — ABNORMAL LOW (ref 12.0–15.0)
Immature Granulocytes: 1 %
Lymphocytes Relative: 17 %
Lymphs Abs: 0.3 10*3/uL — ABNORMAL LOW (ref 0.7–4.0)
MCH: 32.9 pg (ref 26.0–34.0)
MCHC: 32.7 g/dL (ref 30.0–36.0)
MCV: 100.4 fL — ABNORMAL HIGH (ref 80.0–100.0)
Monocytes Absolute: 0.3 10*3/uL (ref 0.1–1.0)
Monocytes Relative: 14 %
Neutro Abs: 1.3 10*3/uL — ABNORMAL LOW (ref 1.7–7.7)
Neutrophils Relative %: 67 %
Platelet Count: 182 10*3/uL (ref 150–400)
RBC: 2.83 MIL/uL — ABNORMAL LOW (ref 3.87–5.11)
RDW: 17.2 % — ABNORMAL HIGH (ref 11.5–15.5)
WBC Count: 2 10*3/uL — ABNORMAL LOW (ref 4.0–10.5)
nRBC: 0 % (ref 0.0–0.2)

## 2019-06-06 LAB — MAGNESIUM: Magnesium: 1.7 mg/dL (ref 1.7–2.4)

## 2019-06-06 LAB — BASIC METABOLIC PANEL - CANCER CENTER ONLY
Anion gap: 9 (ref 5–15)
BUN: 18 mg/dL (ref 8–23)
CO2: 24 mmol/L (ref 22–32)
Calcium: 8 mg/dL — ABNORMAL LOW (ref 8.9–10.3)
Chloride: 105 mmol/L (ref 98–111)
Creatinine: 0.75 mg/dL (ref 0.44–1.00)
GFR, Est AFR Am: >60 mL/min (ref >60)
GFR, Estimated: >60 mL/min (ref >60)
Glucose, Bld: 67 mg/dL — ABNORMAL LOW (ref 70–99)
Potassium: 4.1 mmol/L (ref 3.5–5.1)
Sodium: 138 mmol/L (ref 135–145)

## 2019-06-06 MED ORDER — SODIUM CHLORIDE 0.9% FLUSH
10.0000 mL | Freq: Once | INTRAVENOUS | Status: AC
  Administered 2019-06-06: 10 mL
  Filled 2019-06-06: qty 10

## 2019-06-06 MED ORDER — MAGNESIUM OXIDE 400 (241.3 MG) MG PO TABS: 400.0000 mg | ORAL_TABLET | Freq: Every day | ORAL | 3 refills | Status: AC

## 2019-06-06 MED ORDER — SODIUM CHLORIDE 0.9% FLUSH
10.0000 mL | INTRAVENOUS | Status: DC | PRN
  Administered 2019-06-06: 10 mL
  Filled 2019-06-06: qty 10

## 2019-06-06 MED ORDER — HEPARIN SOD (PORK) LOCK FLUSH 100 UNIT/ML IV SOLN
500.0000 [IU] | Freq: Once | INTRAVENOUS | Status: AC
  Administered 2019-06-06: 500 [IU]
  Filled 2019-06-06: qty 5

## 2019-06-06 MED ORDER — HEPARIN SOD (PORK) LOCK FLUSH 100 UNIT/ML IV SOLN
500.0000 [IU] | Freq: Once | INTRAVENOUS | Status: AC | PRN
  Administered 2019-06-06: 500 [IU]
  Filled 2019-06-06: qty 5

## 2019-06-06 NOTE — Progress Notes (Signed)
[place] OFFICE PROGRESS NOTE  Patient Care Team: Charolette Forward, MD as PCP - General (Cardiology) Wyatt Portela, MD as Consulting Physician (Oncology) Charolette Forward, MD as Referring Physician (Cardiology) Eppie Gibson, MD as Attending Physician (Radiation Oncology) Leota Sauers, RN as Oncology Nurse Navigator  HEME/ONC OVERVIEW: 1. 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 Port and PEG in 04/2019   TREATMENT SUMMARY:  05/02/2019 - present: definitive chemoRT with weekly cisplatin   PERTINENT NON-HEM/ONC PROBLEMS: 1. 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 -S/p 5 cycles of weekly cisplatin concurrent with RT in the definitive setting  -Cisplatin dose reduced to 96m/m2, starting with Cycle 5, due to progressive anemia  -Due to progressive leukopenia with mild neutropenia, I will delay the treatment by a week  -If ANC remains stable or improving at the next visit (at least above 1000), then we can resume treatment  -Plan for 7 doses  -RT to complete on 06/25/2019  -PRN anti-emetics: Zofran, Compazine, Ativan and dexamethasone  Chemotherapy-associated leukopenia -Secondary to chemotherapy -WBC 2.0k with ANC 1300, lower than the last visit  -Patient denies any symptoms of infection -Chemotherapy delay by one week as discussed above   Chemotherapy-associated anemia -Secondary to chemotherapy, as well as chronic bone marrow suppression due to hx of methotrexate for rheumatoid arthritis  -Hgb 9.3, slightly  improving -Patient denies any symptom of bleeding -On folic acid 254mdaily  -We will monitor it closely  Hypocalcemia -Ca 8.0 today, stable -Patient is asymptomatic -Continue OTC Ca-Vit D supplement BID  -We will monitor it closely  Borderline hypomagnesemia -Secondary to electrolyte loss from chemotherapy -Mg 1.7 today -I have prescribed Mg oxide 40080mID   Treatment-associated mucositis -Secondary to chemoradiation -Currently on PRN viscous lidocaine; only use IR liquid morphine sparingly -Pain relatively well controlled -Continue the regimen above   Chemotherapy-associated neuropathy -Secondary to chemotherapy -Neuropathy, mild, stable; Grade 1  -We will monitor it for now   Protein malnutrition -Secondary to chemoradiation -Weight down by ~2 lbs since the last visit  -She is currently taking ~2 Omsolite per day via the PEG, as well as reasonable oral intake  -I encouraged the patient to increase tube feeding if her oral intake decreases, and to adhere to nutritional recommendations  No orders of the defined types were placed in this encounter.  All questions were answered. The patient knows to call the clinic with any problems, questions or concerns. No barriers to learning was detected.  Return in 1 week for labs, port flush, clinic appt and Cycle 6 of chemotherapy.   [name] 06/06/2019 2:17 PM  CHIEF COMPLAINT: "I am doing okay"  INTERVAL HISTORY: Ms. [facility] clinic for follow-up of squamous cell carcinoma of the left vocal cord on definitive chemoradiation.  Patient reports that despite some soreness in the throat, for which she takes viscous lidocaine swish/swallow with reasonable pain control, she has been able to eat most food without any limitation.  She uses viscous lidocaine just before she takes her medications.  Her pain in the throat has not  been severe enough that she has to take liquid morphine.  She currently takes 2 bottles of Osmolite  and the 4 bottles of 16 ounce water via the feeding tube.  She has occasional nausea, usually triggered by thick phlegm, for which Compazine helps.  She also has mild tingling sensation in hands, which started after chemotherapy, but she denies any worsening neuropathy or interference with ADLs.  She denies any other complaint today.  REVIEW OF SYSTEMS:   Constitutional: ( - ) fevers, ( - )  chills , ( - ) night sweats Eyes: ( - ) blurriness of vision, ( - ) double vision, ( - ) watery eyes Ears, nose, mouth, throat, and face: ( + ) mucositis, ( + ) sore throat Respiratory: ( - ) cough, ( - ) dyspnea, ( - ) wheezes Cardiovascular: ( - ) palpitation, ( - ) chest discomfort, ( - ) lower extremity swelling Gastrointestinal:  ( + ) nausea, ( - ) heartburn, ( - ) change in bowel habits Skin: ( - ) abnormal skin rashes Lymphatics: ( - ) new lymphadenopathy, ( - ) easy bruising Neurological: ( - ) numbness, ( + ) tingling, ( - ) new weaknesses Behavioral/Psych: ( - ) mood change, ( - ) new changes  All other systems were reviewed with the patient and are negative.  SUMMARY OF ONCOLOGIC HISTORY: Oncology History  Squamous cell carcinoma of left vocal cord ([place])  03/26/2019 Pathology Results   DIAGNOSIS:   A. VOCAL CORD MASS, LEFT, EXCISION:  - Squamous cell carcinoma.  - See comment.    04/06/2019 Initial Diagnosis   Squamous cell carcinoma of left vocal cord ([place])   04/10/2019 Imaging   PET: IMPRESSION: 1. Left glottic lesion is hypermetabolic with maximum SUV 8.9, compatible with malignancy. No discrete adenopathy in the neck. 2. Extensive periarticular activity especially around the large joints, likely related to rheumatoid arthritis. 3. Small but hypermetabolic axillary and left external iliac lymph nodes are most likely reactive to the patient's rheumatoid arthritis, but merit surveillance. 4. Mild right hydronephrosis without hydroureter, query right UPJ narrowing. Consider  follow renal sonography. 5. Prominent but not hypermetabolic left adnexa, possibly due to prominent ovary or left eccentricity of the uterus, correlate with surgical history. This could be further investigated with pelvic sonography if clinically warranted. 6. Other imaging findings of potential clinical significance: Chronic left maxillary sinusitis. Aortic Atherosclerosis (ICD10-I70.0). Coronary atherosclerosis. Emphysema (ICD10-J43.9). Sigmoid colon diverticulosis.   04/10/2019 Imaging   CT neck w/ contrast: IMPRESSION: 1. Left glottic mass extending to but not beyond the anterior commissure. There is extension into the left paraglottic fat without cartilage erosion. 2. No adenopathy. 3. Aortic Atherosclerosis (ICD10-I70.0) and Emphysema (ICD10-J43.9).   04/11/2019 Cancer Staging   Staging form: Larynx - Glottis, AJCC 8th Edition - Clinical stage from 04/11/2019: Stage III (cT3, cN0, cM0) - Signed by Eppie Gibson, MD on 04/11/2019   05/03/2019 -  Chemotherapy   The patient had palonosetron (ALOXI) injection 0.25 mg, 0.25 mg, Intravenous,  Once, 5 of 7 cycles Administration: 0.25 mg (05/03/2019), 0.25 mg (05/10/2019), 0.25 mg (05/18/2019), 0.25 mg (05/24/2019), 0.25 mg (05/31/2019) CISplatin (PLATINOL) 62 mg in sodium chloride 0.9 % 250 mL chemo infusion, 40 mg/m2 = 62 mg, Intravenous,  Once, 5 of 7 cycles Dose modification: 30 mg/m2 (original dose 40 mg/m2, Cycle 5, Reason: Dose not tolerated) Administration: 62 mg (05/03/2019), 62 mg (05/10/2019), 62 mg (05/18/2019), 62 mg (05/24/2019), 46 mg (05/31/2019) fosaprepitant (EMEND) 150 mg, dexamethasone (DECADRON) 12  mg in sodium chloride 0.9 % 145 mL IVPB, , Intravenous,  Once, 5 of 7 cycles Administration:  (05/03/2019),  (05/10/2019),  (05/18/2019),  (05/24/2019),  (05/31/2019)  for chemotherapy treatment.      I have reviewed the past medical history, past surgical history, social history and family history with the patient and  they are unchanged from previous note.  ALLERGIES:  is allergic to sulfa antibiotics.  MEDICATIONS:  Current Outpatient Medications  Medication Sig Dispense Refill  . Adalimumab (HUMIRA PEN) 40 MG/0.4ML PNKT Inject 40 mg into the skin every 14 (fourteen) days. 3 each 0  . amLODipine (NORVASC) 5 MG tablet Take 5 mg by mouth daily.  0  . Cholecalciferol (VITAMIN D) 50 MCG (2000 UT) CAPS Take 2,000 Units by mouth daily.     . folic acid (FOLVITE) 1 MG tablet TAKE 2 TABLETS(2 MG) BY MOUTH DAILY 180 tablet 3  . lidocaine (XYLOCAINE) 2 % solution Patient: Mix 1part 2% viscous lidocaine, 1part H20. Swish & swallow 27m of diluted mixture, 347m before meals and at bedtime, up to QID 100 mL 5  . lidocaine-prilocaine (EMLA) cream Apply to affected area once 30 g 3  . losartan (COZAAR) 100 MG tablet Take 100 mg by mouth daily.   0  . Nutritional Supplements (FEEDING SUPPLEMENT, OSMOLITE 1.5 CAL,) LIQD Give 1 bottle Osmolite 1.5 via PEG with 60 mL free water before and after 5 times daily.  Give 30 mL Prosource or equivalent mixed with 90 mL of water via PEG and flush with 60 mL of water.  Drink or flush feeding tube with 240 mL of free water 3 times daily between tube feedings. 1185 mL 3  . prochlorperazine (COMPAZINE) 10 MG tablet Take 1 tablet (10 mg total) by mouth every 6 (six) hours as needed (Nausea or vomiting). 30 tablet 1  . vitamin B-12 (CYANOCOBALAMIN) 1000 MCG tablet Take 1,000 mcg by mouth daily.    . ALLERGIST TRAY 1CC 27GX1/2" 27G X 1/2" 1 ML KIT TO BE USED WITH WEEKLY METHOTREXATE INJECTIONS ONCE A WEEK (Patient not taking: Reported on 05/09/2019) 25 each 0  . dexamethasone (DECADRON) 4 MG tablet Take 2 tablets by mouth once a day on the day after chemotherapy and then take 2 tablets two times a day for 2 days. Take with food. (Patient not taking: Reported on 06/06/2019) 30 tablet 1  . LORazepam (ATIVAN) 0.5 MG tablet Take 1 tablet (0.5 mg total) by mouth every 6 (six) hours as needed  (Nausea or vomiting). (Patient not taking: Reported on 05/16/2019) 30 tablet 0  . methotrexate 50 MG/2ML injection INJECT 0.6 ML(15 MG TOTAL) UNDER THE SKIN ONCE A WEEK (Patient not taking: Reported on 05/09/2019) 8 mL 0  . ondansetron (ZOFRAN) 8 MG tablet Take 1 tablet (8 mg total) by mouth 2 (two) times daily as needed. Start on the third day after chemotherapy. (Patient not taking: Reported on 06/06/2019) 30 tablet 1  . potassium chloride SA (KLOR-CON) 20 MEQ tablet Take 1 tablet (20 mEq total) by mouth daily. (Patient not taking: Reported on 05/16/2019) 30 tablet 2  . sodium fluoride (PREVIDENT 5000 PLUS) 1.1 % CREA dental cream Apply to tooth brush. Brush teeth for 2 minutes. Spit out excess-DO NOT swallow. DO NOT rinse afterwards. Repeat nightly. (Patient not taking: Reported on 05/31/2019) 1 Tube prn  . traZODone (DESYREL) 50 MG tablet Take 1 tablet (50 mg total) by mouth at bedtime as needed for sleep. (Patient not taking: Reported on 05/31/2019) 30  tablet 4   No current facility-administered medications for this visit.    PHYSICAL EXAMINATION: ECOG PERFORMANCE STATUS: 1 - Symptomatic but completely ambulatory  Today's Vitals   06/06/19 1408 06/06/19 1415  BP: (!) 143/77   Pulse: 82   Resp: 16   Temp: 98.5 F (36.9 C)   TempSrc: Temporal   SpO2: 100%   Weight: 119 lb 4.8 oz (54.1 kg)   Height: 5' 3"  (1.6 m)   PainSc:  0-No pain   Body mass index is 21.13 kg/m.  Filed Weights   06/06/19 1408  Weight: 119 lb 4.8 oz (54.1 kg)    GENERAL: alert, no distress and comfortable SKIN: mild skin irritation at the base of the neck from radiation, no skin desquamation  EYES: conjunctiva are pink and non-injected, sclera clear OROPHARYNX: no exudate, no erythema; lips, buccal mucosa, and tongue normal  NECK: supple, non-tender LYMPH:  no palpable lymphadenopathy in the cervical LUNGS: clear to auscultation with normal breathing effort HEART: regular rate & rhythm and no murmurs and  no lower extremity edema ABDOMEN: soft, non-tender, non-distended, normal bowel sounds Musculoskeletal: no cyanosis of digits and no clubbing  PSYCH: alert & oriented x 3, fluent, mildly hoarse speech  LABORATORY DATA:  I have reviewed the data as listed    Component Value Date/Time   NA 139 05/30/2019 1328   NA 141 03/05/2014 1050   K 4.1 05/30/2019 1328   K 3.9 03/05/2014 1050   CL 106 05/30/2019 1328   CO2 25 05/30/2019 1328   CO2 24 03/05/2014 1050   GLUCOSE 80 05/30/2019 1328   GLUCOSE 99 03/05/2014 1050   BUN 15 05/30/2019 1328   BUN 10.6 03/05/2014 1050   CREATININE 0.69 05/30/2019 1328   CREATININE 0.77 01/15/2019 0907   CREATININE 0.8 03/05/2014 1050   CALCIUM 7.9 (L) 05/30/2019 1328   CALCIUM 8.8 03/05/2014 1050   PROT 7.2 04/18/2019 0822   PROT 6.5 03/05/2014 1050   ALBUMIN 3.8 04/18/2019 0822   ALBUMIN 3.2 (L) 03/05/2014 1050   AST 19 04/18/2019 0822   AST 12 03/05/2014 1050   ALT 22 04/18/2019 0822   ALT 28 03/05/2014 1050   ALKPHOS 106 04/18/2019 0822   ALKPHOS 85 03/05/2014 1050   BILITOT 0.2 (L) 04/18/2019 0822   BILITOT 0.20 03/05/2014 1050   GFRNONAA >60 05/30/2019 1328   GFRNONAA 78 01/15/2019 0907   GFRAA >60 05/30/2019 1328   GFRAA 91 01/15/2019 0907    No results found for: SPEP, UPEP  Lab Results  Component Value Date   WBC 2.0 (L) 06/06/2019   NEUTROABS 1.3 (L) 06/06/2019   HGB 9.3 (L) 06/06/2019   HCT 28.4 (L) 06/06/2019   MCV 100.4 (H) 06/06/2019   PLT 182 06/06/2019      Chemistry      Component Value Date/Time   NA 139 05/30/2019 1328   NA 141 03/05/2014 1050   K 4.1 05/30/2019 1328   K 3.9 03/05/2014 1050   CL 106 05/30/2019 1328   CO2 25 05/30/2019 1328   CO2 24 03/05/2014 1050   BUN 15 05/30/2019 1328   BUN 10.6 03/05/2014 1050   CREATININE 0.69 05/30/2019 1328   CREATININE 0.77 01/15/2019 0907   CREATININE 0.8 03/05/2014 1050      Component Value Date/Time   CALCIUM 7.9 (L) 05/30/2019 1328   CALCIUM 8.8  03/05/2014 1050   ALKPHOS 106 04/18/2019 0822   ALKPHOS 85 03/05/2014 1050   AST 19 04/18/2019 4098  AST 12 03/05/2014 1050   ALT 22 04/18/2019 0822   ALT 28 03/05/2014 1050   BILITOT 0.2 (L) 04/18/2019 0822   BILITOT 0.20 03/05/2014 1050       RADIOGRAPHIC STUDIES: I have personally reviewed the radiological images as listed below and agreed with the findings in the report. No results found.

## 2019-06-07 ENCOUNTER — Telehealth: Payer: Self-pay | Admitting: Hematology

## 2019-06-07 ENCOUNTER — Ambulatory Visit
Admission: RE | Admit: 2019-06-07 | Discharge: 2019-06-07 | Disposition: A | Discharge status: Home / Self Care | Payer: Medicare Other | Source: Ambulatory Visit | Attending: Radiation Oncology | Admitting: Radiation Oncology

## 2019-06-07 ENCOUNTER — Inpatient Hospital Stay: Payer: Medicare Other

## 2019-06-07 ENCOUNTER — Inpatient Hospital Stay: Payer: Medicare Other | Admitting: Nutrition

## 2019-06-07 ENCOUNTER — Other Ambulatory Visit: Payer: Self-pay

## 2019-06-07 DIAGNOSIS — Z79899 Other long term (current) drug therapy: Secondary | ICD-10-CM | POA: Diagnosis not present

## 2019-06-07 DIAGNOSIS — C320 Malignant neoplasm of glottis: Secondary | ICD-10-CM | POA: Diagnosis not present

## 2019-06-07 DIAGNOSIS — Z510 Encounter for antineoplastic radiation therapy: Secondary | ICD-10-CM | POA: Diagnosis not present

## 2019-06-07 NOTE — Telephone Encounter (Signed)
I left a message regarding schedule  

## 2019-06-08 ENCOUNTER — Other Ambulatory Visit: Payer: Self-pay

## 2019-06-08 ENCOUNTER — Ambulatory Visit
Admission: RE | Admit: 2019-06-08 | Discharge: 2019-06-08 | Disposition: A | Discharge status: Home / Self Care | Payer: Medicare Other | Source: Ambulatory Visit | Attending: Radiation Oncology | Admitting: Radiation Oncology

## 2019-06-08 DIAGNOSIS — Z510 Encounter for antineoplastic radiation therapy: Secondary | ICD-10-CM | POA: Diagnosis not present

## 2019-06-08 DIAGNOSIS — C320 Malignant neoplasm of glottis: Secondary | ICD-10-CM | POA: Diagnosis not present

## 2019-06-08 DIAGNOSIS — Z79899 Other long term (current) drug therapy: Secondary | ICD-10-CM | POA: Diagnosis not present

## 2019-06-11 ENCOUNTER — Other Ambulatory Visit: Payer: Self-pay

## 2019-06-11 ENCOUNTER — Ambulatory Visit
Admission: RE | Admit: 2019-06-11 | Discharge: 2019-06-11 | Disposition: A | Discharge status: Home / Self Care | Payer: Medicare Other | Source: Ambulatory Visit | Attending: Radiation Oncology | Admitting: Radiation Oncology

## 2019-06-11 DIAGNOSIS — Z510 Encounter for antineoplastic radiation therapy: Secondary | ICD-10-CM | POA: Diagnosis not present

## 2019-06-11 DIAGNOSIS — Z79899 Other long term (current) drug therapy: Secondary | ICD-10-CM | POA: Diagnosis not present

## 2019-06-11 DIAGNOSIS — C320 Malignant neoplasm of glottis: Secondary | ICD-10-CM

## 2019-06-11 MED ORDER — SONAFINE EX EMUL
1.0000 "application " | Freq: Once | CUTANEOUS | Status: AC
  Administered 2019-06-11: 1 via TOPICAL

## 2019-06-12 ENCOUNTER — Ambulatory Visit
Admission: RE | Admit: 2019-06-12 | Discharge: 2019-06-12 | Disposition: A | Discharge status: Home / Self Care | Payer: Medicare Other | Source: Ambulatory Visit | Attending: Radiation Oncology | Admitting: Radiation Oncology

## 2019-06-12 ENCOUNTER — Other Ambulatory Visit: Payer: Self-pay

## 2019-06-12 DIAGNOSIS — C320 Malignant neoplasm of glottis: Secondary | ICD-10-CM | POA: Diagnosis not present

## 2019-06-12 DIAGNOSIS — Z510 Encounter for antineoplastic radiation therapy: Secondary | ICD-10-CM | POA: Diagnosis not present

## 2019-06-12 DIAGNOSIS — Z79899 Other long term (current) drug therapy: Secondary | ICD-10-CM | POA: Diagnosis not present

## 2019-06-13 ENCOUNTER — Other Ambulatory Visit: Payer: Medicare Other

## 2019-06-13 ENCOUNTER — Inpatient Hospital Stay (HOSPITAL_BASED_OUTPATIENT_CLINIC_OR_DEPARTMENT_OTHER): Payer: Medicare Other | Admitting: Physician Assistant

## 2019-06-13 ENCOUNTER — Inpatient Hospital Stay: Payer: Medicare Other

## 2019-06-13 ENCOUNTER — Other Ambulatory Visit: Payer: Self-pay | Admitting: Hematology

## 2019-06-13 ENCOUNTER — Other Ambulatory Visit: Payer: Self-pay

## 2019-06-13 ENCOUNTER — Ambulatory Visit
Admission: RE | Admit: 2019-06-13 | Discharge: 2019-06-13 | Disposition: A | Discharge status: Home / Self Care | Payer: Medicare Other | Source: Ambulatory Visit | Attending: Radiation Oncology | Admitting: Radiation Oncology

## 2019-06-13 VITALS — BP 146/76 | HR 94 | Temp 99.1°F | Resp 18 | Ht 63.0 in | Wt 120.2 lb

## 2019-06-13 DIAGNOSIS — D6481 Anemia due to antineoplastic chemotherapy: Secondary | ICD-10-CM | POA: Diagnosis not present

## 2019-06-13 DIAGNOSIS — G4700 Insomnia, unspecified: Secondary | ICD-10-CM

## 2019-06-13 DIAGNOSIS — Z510 Encounter for antineoplastic radiation therapy: Secondary | ICD-10-CM | POA: Diagnosis not present

## 2019-06-13 DIAGNOSIS — K1231 Oral mucositis (ulcerative) due to antineoplastic therapy: Secondary | ICD-10-CM

## 2019-06-13 DIAGNOSIS — G620 Drug-induced polyneuropathy: Secondary | ICD-10-CM

## 2019-06-13 DIAGNOSIS — Z5111 Encounter for antineoplastic chemotherapy: Secondary | ICD-10-CM | POA: Diagnosis not present

## 2019-06-13 DIAGNOSIS — T451X5A Adverse effect of antineoplastic and immunosuppressive drugs, initial encounter: Secondary | ICD-10-CM | POA: Diagnosis not present

## 2019-06-13 DIAGNOSIS — C320 Malignant neoplasm of glottis: Secondary | ICD-10-CM

## 2019-06-13 DIAGNOSIS — Z95828 Presence of other vascular implants and grafts: Secondary | ICD-10-CM

## 2019-06-13 DIAGNOSIS — Z79899 Other long term (current) drug therapy: Secondary | ICD-10-CM | POA: Diagnosis not present

## 2019-06-13 LAB — CBC WITH DIFFERENTIAL (CANCER CENTER ONLY)
Abs Immature Granulocytes: 0.01 10*3/uL (ref 0.00–0.07)
Basophils Absolute: 0 10*3/uL (ref 0.0–0.1)
Basophils Relative: 0 %
Eosinophils Absolute: 0 10*3/uL (ref 0.0–0.5)
Eosinophils Relative: 1 %
HCT: 26.6 % — ABNORMAL LOW (ref 36.0–46.0)
Hemoglobin: 8.7 g/dL — ABNORMAL LOW (ref 12.0–15.0)
Immature Granulocytes: 1 %
Lymphocytes Relative: 22 %
Lymphs Abs: 0.4 10*3/uL — ABNORMAL LOW (ref 0.7–4.0)
MCH: 33.3 pg (ref 26.0–34.0)
MCHC: 32.7 g/dL (ref 30.0–36.0)
MCV: 101.9 fL — ABNORMAL HIGH (ref 80.0–100.0)
Monocytes Absolute: 0.2 10*3/uL (ref 0.1–1.0)
Monocytes Relative: 10 %
Neutro Abs: 1.4 10*3/uL — ABNORMAL LOW (ref 1.7–7.7)
Neutrophils Relative %: 66 %
Platelet Count: 114 10*3/uL — ABNORMAL LOW (ref 150–400)
RBC: 2.61 MIL/uL — ABNORMAL LOW (ref 3.87–5.11)
RDW: 18.3 % — ABNORMAL HIGH (ref 11.5–15.5)
WBC Count: 2 10*3/uL — ABNORMAL LOW (ref 4.0–10.5)
nRBC: 0 % (ref 0.0–0.2)

## 2019-06-13 LAB — BASIC METABOLIC PANEL - CANCER CENTER ONLY
Anion gap: 7 (ref 5–15)
BUN: 18 mg/dL (ref 8–23)
CO2: 27 mmol/L (ref 22–32)
Calcium: 8.7 mg/dL — ABNORMAL LOW (ref 8.9–10.3)
Chloride: 102 mmol/L (ref 98–111)
Creatinine: 0.7 mg/dL (ref 0.44–1.00)
GFR, Est AFR Am: >60 mL/min (ref >60)
GFR, Estimated: >60 mL/min (ref >60)
Glucose, Bld: 77 mg/dL (ref 70–99)
Potassium: 4.1 mmol/L (ref 3.5–5.1)
Sodium: 136 mmol/L (ref 135–145)

## 2019-06-13 LAB — MAGNESIUM: Magnesium: 1.8 mg/dL (ref 1.7–2.4)

## 2019-06-13 MED ORDER — HEPARIN SOD (PORK) LOCK FLUSH 100 UNIT/ML IV SOLN
500.0000 [IU] | Freq: Once | INTRAVENOUS | Status: AC | PRN
  Administered 2019-06-13: 500 [IU]
  Filled 2019-06-13: qty 5

## 2019-06-13 MED ORDER — SODIUM CHLORIDE 0.9% FLUSH
10.0000 mL | INTRAVENOUS | Status: DC | PRN
  Administered 2019-06-13: 10 mL
  Filled 2019-06-13: qty 10

## 2019-06-13 NOTE — Progress Notes (Signed)
[place] OFFICE PROGRESS NOTE  Patient Care Team: Charolette Forward, MD as PCP - General (Cardiology) Wyatt Portela, MD as Consulting Physician (Oncology) Charolette Forward, MD as Referring Physician (Cardiology) Eppie Gibson, MD as Attending Physician (Radiation Oncology) Leota Sauers, RN as Oncology Nurse Navigator  HEME/ONC OVERVIEW: 1. 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 FDG-avid left glottic malignancy on PET without definite metastatic disease  -04/2019 - present: definitive chemoRT with weekly cisplatin   2. Port and PEG in 04/2019   TREATMENT SUMMARY:  05/02/2019 - present: definitive chemoRT with weekly cisplatin   PERTINENT NON-HEM/ONC PROBLEMS: 1. 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 Adequate for treatment with cycle #6 tomorrow as schedule. We will monitor her labs closely weekly. Patient on reduced dose Cisplatin 30 mg/m2.  -Plan for 7 doses  -RT to complete on 06/25/2019.  -PRN anti-emetics: Zofran, Compazine, Ativan and dexamethasone  Chemotherapy-associated leukopenia -Secondary to chemotherapy -WBC 2.0k with ANC 1400. Stable from last visit. -Patient denies any symptoms of infection -Neutropenic precautions discussed with the patient. Advised to call immediately if she develops signs and symptoms of infection.    Chemotherapy-associated anemia -Secondary to chemotherapy, as well as  chronic bone marrow suppression due to hx of methotrexate for rheumatoid arthritis  -Hgb 8.7 stable.  -Patient denies any symptom of bleeding -On folic acid 241mdaily  -We will monitor it closely  Hypocalcemia -Ca 8.7 today, improved -Patient is asymptomatic -Continue OTC Ca-Vit D supplement BID  -We will monitor it closely  Borderline hypomagnesemia -Secondary to electrolyte loss from chemotherapy -Mg 1.8 today -Will continue oral magnesium oxide supplements.  Treatment-associated mucositis -Secondary to chemoradiation -Currently on PRN viscous lidocaine; only use IR liquid morphine sparingly -Pain relatively well controlled -Continue the regimen above   Chemotherapy-associated neuropathy -Secondary to chemotherapy -Neuropathy, mild, stable; Grade 1  -We will monitor it for now   Protein malnutrition -Secondary to chemoradiation -Weight up by ~1 lbs since the last visit  -She is currently taking ~4 Omsolite per day via the PEG, which she increased starting on 06/11/2019. She is drinking ~60 ounces of water per patient report. -I encouraged the patient to increase tube feeding if her oral intake decreases, and to adhere to nutritional recommendations -Meeting with nutritional staff during infusion tomorrow on 06/14/2019.  No orders of the defined types were placed in this encounter.  All questions were answered. The patient knows to call the clinic with any problems, questions or concerns. No barriers to learning was detected.  Return in 1 week for labs, port flush, clinic appt and Cycle 7 of chemotherapy.   CHIEF COMPLAINT: "I am doing okay"  INTERVAL HISTORY: [name] for a follow-up of squamous cell carcinoma of the left vocal cord on definitive chemoradiation.  Patient reports that despite some soreness in the throat, for which she takes viscous lidocaine swish/swallow with reasonable pain control, she has been able to eat most food without  any limitation. She has not lost any weight since her last appointment. She uses viscous lidocaine just before she takes her medications. She takes liquid morphine if needed for significant pain. She is followed closely by nutrition and is scheduled to see them during infusion tomorrow. She states that she increased her Osmolite to 4 per day on Monday 12/21 and she states she is using approximately 60 ounces of water daily via her feeding tube. She has occasional nausea, usually triggered by thick phlegm, for which Compazine helps.  She also has mild tingling sensation in hands, which started after chemotherapy, but she denies any worsening neuropathy or interference with ADLs. She denies any other complaint today. She notes radiation skin irritation on her neck for which she is using neosporin. Her treatment was delayed last week secondary to neutropenia. She denies any signs and symptoms of infection at this time including nasal congestion, cough, shortness of breath, diarrhea, constipation, skin infections, or dysuria. She has no other complaints today.   SUMMARY OF ONCOLOGIC HISTORY:    Oncology History  Squamous cell carcinoma of left vocal cord ([place])  03/26/2019 Pathology Results   DIAGNOSIS:   A. VOCAL CORD MASS, LEFT, EXCISION:  - Squamous cell carcinoma.  - See comment.    04/06/2019 Initial Diagnosis   Squamous cell carcinoma of left vocal cord ([place])   04/10/2019 Imaging   PET: IMPRESSION: 1. Left glottic lesion is hypermetabolic with maximum SUV 8.9, compatible with malignancy. No discrete adenopathy in the neck. 2. Extensive periarticular activity especially around the large joints, likely related to rheumatoid arthritis. 3. Small but hypermetabolic axillary and left external iliac lymph nodes are most likely reactive to the patient's rheumatoid arthritis, but merit surveillance. 4. Mild right hydronephrosis without hydroureter, query right UPJ narrowing. Consider follow  renal sonography. 5. Prominent but not hypermetabolic left adnexa, possibly due to prominent ovary or left eccentricity of the uterus, correlate with surgical history. This could be further investigated with pelvic sonography if clinically warranted. 6. Other imaging findings of potential clinical significance: Chronic left maxillary sinusitis. Aortic Atherosclerosis (ICD10-I70.0). Coronary atherosclerosis. Emphysema (ICD10-J43.9). Sigmoid colon diverticulosis.   04/10/2019 Imaging   CT neck w/ contrast: IMPRESSION: 1. Left glottic mass extending to but not beyond the anterior commissure. There is extension into the left paraglottic fat without cartilage erosion. 2. No adenopathy. 3. Aortic Atherosclerosis (ICD10-I70.0) and Emphysema (ICD10-J43.9).   04/11/2019 Cancer Staging   Staging form: Larynx - Glottis, AJCC 8th Edition - Clinical stage from 04/11/2019: Stage III (cT3, cN0, cM0) - Signed by Eppie Gibson, MD on 04/11/2019   05/03/2019 -  Chemotherapy   The patient had palonosetron (ALOXI) injection 0.25 mg, 0.25 mg, Intravenous,  Once, 5 of 7 cycles Administration: 0.25 mg (05/03/2019), 0.25 mg (05/10/2019), 0.25 mg (05/18/2019), 0.25 mg (05/24/2019), 0.25 mg (05/31/2019) CISplatin (PLATINOL) 62 mg in sodium chloride 0.9 % 250 mL chemo infusion, 40 mg/m2 = 62 mg, Intravenous,  Once, 5 of 7 cycles Dose modification: 30 mg/m2 (original dose 40 mg/m2, Cycle 5, Reason: Dose not tolerated) Administration: 62 mg (05/03/2019), 62 mg (05/10/2019), 62 mg (05/18/2019), 62 mg (05/24/2019), 46 mg (05/31/2019) fosaprepitant (EMEND) 150 mg, dexamethasone (DECADRON) 12 mg in sodium chloride 0.9 % 145 mL IVPB, , Intravenous,  Once, 5 of 7 cycles Administration:  (05/03/2019),  (05/10/2019),  (05/18/2019),  (05/24/2019),  (05/31/2019)  for chemotherapy treatment.       MEDICAL HISTORY: Past Medical History:  Diagnosis Date  . Arthritis   . Hypertension   .  Rheumatoid arthritis  ([place])   . Vocal cord mass     ALLERGIES:  is allergic to sulfa antibiotics.  MEDICATIONS:  Current Outpatient Medications  Medication Sig Dispense Refill  . Adalimumab (HUMIRA PEN) 40 MG/0.4ML PNKT Inject 40 mg into the skin every 14 (fourteen) days. 3 each 0  . amLODipine (NORVASC) 5 MG tablet Take 5 mg by mouth daily.  0  . Cholecalciferol (VITAMIN D) 50 MCG (2000 UT) CAPS Take 2,000 Units by mouth daily.     . folic acid (FOLVITE) 1 MG tablet TAKE 2 TABLETS(2 MG) BY MOUTH DAILY 180 tablet 3  . lidocaine (XYLOCAINE) 2 % solution Patient: Mix 1part 2% viscous lidocaine, 1part H20. Swish & swallow 69m of diluted mixture, 368m before meals and at bedtime, up to QID 100 mL 5  . lidocaine-prilocaine (EMLA) cream Apply to affected area once 30 g 3  . losartan (COZAAR) 100 MG tablet Take 100 mg by mouth daily.   0  . magnesium oxide (MAG-OX) 400 (241.3 Mg) MG tablet Take 1 tablet (400 mg total) by mouth daily. 30 tablet 3  . Nutritional Supplements (FEEDING SUPPLEMENT, OSMOLITE 1.5 CAL,) LIQD Give 1 bottle Osmolite 1.5 via PEG with 60 mL free water before and after 5 times daily.  Give 30 mL Prosource or equivalent mixed with 90 mL of water via PEG and flush with 60 mL of water.  Drink or flush feeding tube with 240 mL of free water 3 times daily between tube feedings. 1185 mL 3  . prochlorperazine (COMPAZINE) 10 MG tablet Take 1 tablet (10 mg total) by mouth every 6 (six) hours as needed (Nausea or vomiting). 30 tablet 1  . sodium fluoride (PREVIDENT 5000 PLUS) 1.1 % CREA dental cream Apply to tooth brush. Brush teeth for 2 minutes. Spit out excess-DO NOT swallow. DO NOT rinse afterwards. Repeat nightly. 1 Tube prn  . vitamin B-12 (CYANOCOBALAMIN) 1000 MCG tablet Take 1,000 mcg by mouth daily.    . ALLERGIST TRAY 1CC 27GX1/2" 27G X 1/2" 1 ML KIT TO BE USED WITH WEEKLY METHOTREXATE INJECTIONS ONCE A WEEK (Patient not taking: Reported on 05/09/2019) 25 each 0  . dexamethasone (DECADRON) 4 MG tablet  Take 2 tablets by mouth once a day on the day after chemotherapy and then take 2 tablets two times a day for 2 days. Take with food. (Patient not taking: Reported on 06/06/2019) 30 tablet 1  . LORazepam (ATIVAN) 0.5 MG tablet Take 1 tablet (0.5 mg total) by mouth every 6 (six) hours as needed (Nausea or vomiting). (Patient not taking: Reported on 05/16/2019) 30 tablet 0  . methotrexate 50 MG/2ML injection INJECT 0.6 ML(15 MG TOTAL) UNDER THE SKIN ONCE A WEEK (Patient not taking: Reported on 05/09/2019) 8 mL 0  . ondansetron (ZOFRAN) 8 MG tablet Take 1 tablet (8 mg total) by mouth 2 (two) times daily as needed. Start on the third day after chemotherapy. (Patient not taking: Reported on 06/06/2019) 30 tablet 1  . potassium chloride SA (KLOR-CON) 20 MEQ tablet Take 1 tablet (20 mEq total) by mouth daily. (Patient not taking: Reported on 05/16/2019) 30 tablet 2  . traZODone (DESYREL) 50 MG tablet Take 1 tablet (50 mg total) by mouth at bedtime as needed for sleep. (Patient not taking: Reported on 05/31/2019) 30 tablet 4   No current facility-administered medications for this visit.    SURGICAL HISTORY:  Past Surgical History:  Procedure Laterality Date  . ECTOPIC PREGNANCY SURGERY    . EXTERNAL  EAR SURGERY Right    removal of cyst or gland  . IR GASTROSTOMY TUBE MOD SED  04/26/2019  . IR IMAGING GUIDED PORT INSERTION  04/26/2019  . MICROLARYNGOSCOPY Left 03/26/2019   Procedure: DIRECT MICROLARYNGOSCOPY WITH BIOPSY OF VOCAL CORD MASS;  Surgeon: Leta Baptist, MD;  Location: [facility];  Service: ENT;  Laterality: Left;    REVIEW OF SYSTEMS:   Review of Systems  Constitutional: Negative for chills, fatigue, fever and unexpected weight change.  HENT: Positive for mouth/throat soreness secondary to radiotherapy. Negative for nosebleeds.   Eyes: Negative for eye problems and icterus.  Respiratory: Negative for cough, hemoptysis, shortness of breath and wheezing.   Cardiovascular: Negative  for chest pain and leg swelling.  Gastrointestinal: Negative for abdominal pain, constipation, diarrhea, nausea and vomiting.  Genitourinary: Negative for bladder incontinence, difficulty urinating, dysuria, frequency and hematuria.   Musculoskeletal: Negative for back pain, gait problem, neck pain and neck stiffness.  Skin: Positive for skin irritation secondary to radiation on anterior neck.   Neurological: Positive for mild peripheral neuropathy in hands bilaterally. Negative for dizziness, extremity weakness, gait problem, headaches, light-headedness and seizures.  Hematological: Negative for adenopathy. Does not bruise/bleed easily.  Psychiatric/Behavioral: Negative for confusion, depression and sleep disturbance. The patient is not nervous/anxious.     PHYSICAL EXAMINATION:  Blood pressure (!) 146/76, pulse 94, temperature 99.1 F (37.3 C), temperature source Temporal, resp. rate 18, height '5\' 3"'$  (1.6 m), weight 120 lb 3.2 oz (54.5 kg), SpO2 100 %.  ECOG PERFORMANCE STATUS: 1 - Symptomatic but completely ambulatory  Physical Exam  Constitutional: Oriented to person, place, and time and well-developed, well-nourished, and in no distress.  HENT:  Head: Normocephalic and atraumatic.  Mouth/Throat: Oropharynx is clear and moist. No oropharyngeal exudate. Positive for hoarse voice.  Eyes: Conjunctivae are normal. Right eye exhibits no discharge. Left eye exhibits no discharge. No scleral icterus.  Neck: Normal range of motion.   Cardiovascular: Normal rate, regular rhythm, normal heart sounds and intact distal pulses.   Pulmonary/Chest: Effort normal and breath sounds normal. No respiratory distress. No wheezes. No rales.  Abdominal: Soft. Bowel sounds are normal. Exhibits no distension and no mass. There is no tenderness.  Musculoskeletal: Normal range of motion. Exhibits no edema.  Lymphadenopathy:    No cervical adenopathy.  Neurological: Alert and oriented to person, place, and  time. Exhibits normal muscle tone. Gait normal. Coordination normal.  Skin: Skin irritation over the anterior neck secondary to radiation. Skin is warm and dry. No rash noted. Not diaphoretic. No pallor.  Psychiatric: Mood, memory and judgment normal.  Vitals reviewed.  LABORATORY DATA: Lab Results  Component Value Date   WBC 2.0 (L) 06/13/2019   HGB 8.7 (L) 06/13/2019   HCT 26.6 (L) 06/13/2019   MCV 101.9 (H) 06/13/2019   PLT 114 (L) 06/13/2019      Chemistry      Component Value Date/Time   NA 136 06/13/2019 1401   NA 141 03/05/2014 1050   K 4.1 06/13/2019 1401   K 3.9 03/05/2014 1050   CL 102 06/13/2019 1401   CO2 27 06/13/2019 1401   CO2 24 03/05/2014 1050   BUN 18 06/13/2019 1401   BUN 10.6 03/05/2014 1050   CREATININE 0.70 06/13/2019 1401   CREATININE 0.77 01/15/2019 0907   CREATININE 0.8 03/05/2014 1050      Component Value Date/Time   CALCIUM 8.7 (L) 06/13/2019 1401   CALCIUM 8.8 03/05/2014 1050   ALKPHOS 106 04/18/2019  4591   ALKPHOS 85 03/05/2014 1050   AST 19 04/18/2019 0822   AST 12 03/05/2014 1050   ALT 22 04/18/2019 0822   ALT 28 03/05/2014 1050   BILITOT 0.2 (L) 04/18/2019 0822   BILITOT 0.20 03/05/2014 1050       RADIOGRAPHIC STUDIES:  No results found.  No orders of the defined types were placed in this encounter.    Garnett Rekowski L Rosely Fernandez, PA-C 06/13/19

## 2019-06-14 ENCOUNTER — Ambulatory Visit
Admission: RE | Admit: 2019-06-14 | Discharge: 2019-06-14 | Disposition: A | Discharge status: Home / Self Care | Payer: Medicare Other | Source: Ambulatory Visit | Attending: Radiation Oncology | Admitting: Radiation Oncology

## 2019-06-14 ENCOUNTER — Inpatient Hospital Stay: Payer: Medicare Other | Admitting: Nutrition

## 2019-06-14 ENCOUNTER — Inpatient Hospital Stay: Payer: Medicare Other

## 2019-06-14 ENCOUNTER — Other Ambulatory Visit: Payer: Self-pay

## 2019-06-14 VITALS — BP 140/78 | HR 97 | Temp 98.5°F | Resp 22 | Wt 118.5 lb

## 2019-06-14 DIAGNOSIS — C320 Malignant neoplasm of glottis: Secondary | ICD-10-CM | POA: Diagnosis not present

## 2019-06-14 DIAGNOSIS — Z5111 Encounter for antineoplastic chemotherapy: Secondary | ICD-10-CM | POA: Diagnosis not present

## 2019-06-14 DIAGNOSIS — Z510 Encounter for antineoplastic radiation therapy: Secondary | ICD-10-CM | POA: Diagnosis not present

## 2019-06-14 DIAGNOSIS — Z79899 Other long term (current) drug therapy: Secondary | ICD-10-CM | POA: Diagnosis not present

## 2019-06-14 MED ORDER — PALONOSETRON HCL INJECTION 0.25 MG/5ML
0.2500 mg | Freq: Once | INTRAVENOUS | Status: AC
  Administered 2019-06-14: 0.25 mg via INTRAVENOUS

## 2019-06-14 MED ORDER — SODIUM CHLORIDE 0.9 % IV SOLN
Freq: Once | INTRAVENOUS | Status: AC
  Filled 2019-06-14: qty 5

## 2019-06-14 MED ORDER — SODIUM CHLORIDE 0.9 % IV SOLN
Freq: Once | INTRAVENOUS | Status: AC
  Filled 2019-06-14: qty 250

## 2019-06-14 MED ORDER — HEPARIN SOD (PORK) LOCK FLUSH 100 UNIT/ML IV SOLN
500.0000 [IU] | Freq: Once | INTRAVENOUS | Status: AC | PRN
  Administered 2019-06-14: 500 [IU]
  Filled 2019-06-14: qty 5

## 2019-06-14 MED ORDER — POTASSIUM CHLORIDE 2 MEQ/ML IV SOLN
Freq: Once | INTRAVENOUS | Status: AC
  Filled 2019-06-14: qty 10

## 2019-06-14 MED ORDER — PALONOSETRON HCL INJECTION 0.25 MG/5ML
INTRAVENOUS | Status: AC
  Filled 2019-06-14: qty 5

## 2019-06-14 MED ORDER — SODIUM CHLORIDE 0.9% FLUSH
10.0000 mL | INTRAVENOUS | Status: DC | PRN
  Administered 2019-06-14: 10 mL
  Filled 2019-06-14: qty 10

## 2019-06-14 MED ORDER — SODIUM CHLORIDE 0.9 % IV SOLN
30.0000 mg/m2 | Freq: Once | INTRAVENOUS | Status: AC
  Administered 2019-06-14: 46 mg via INTRAVENOUS
  Filled 2019-06-14: qty 46

## 2019-06-14 NOTE — Progress Notes (Signed)
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  Patient is receiving cycle 6 today of her chemotherapy and radiation is scheduled to complete on January 4. Weight was documented as 118.5 pounds on December 24 increased from 119.3 pounds December 16. Patient states she has increased her Osmolite 1.5-4 bottles daily and is getting 60 ounces of water every day. She does continue to still try to eat soft foods and admits it takes quite a while for her to do so. Labs were reviewed.  Estimated nutrition needs: 1630-1900 cal, 82-95 g protein, 2.0 L fluid.  Nutrition diagnosis: Inadequate oral intake continues.  Intervention: Educated patient to increase Osmolite 1.5 to 4-5 bottles daily via PEG depending on oral intake. Use 30 mL of Prosource once daily via tube. Continue soft foods as tolerated. Questions were answered.  Teach back method used.  Monitoring, evaluation, goals: Patient will continue to tolerate tube feeding and oral intake to minimize weight loss and promote healing.  Next visit: Follow-up has not been scheduled however I will try to follow-up with patient by telephone in the next month.  She has my contact information for any questions or concerns.  **Disclaimer: This note was dictated with voice recognition software. Similar sounding words can inadvertently be transcribed and this note may contain transcription errors which may not have been corrected upon publication of note.** \

## 2019-06-14 NOTE — Patient Instructions (Signed)
[facility] Discharge Instructions for Patients Receiving Chemotherapy  Today you received the following chemotherapy agent: Cisplatin  To help prevent nausea and vomiting after your treatment, we encourage you to take your nausea medication as directed by your MD.   If you develop nausea and vomiting that is not controlled by your nausea medication, call the clinic.   BELOW ARE SYMPTOMS THAT SHOULD BE REPORTED IMMEDIATELY:  *FEVER GREATER THAN 100.5 F  *CHILLS WITH OR WITHOUT FEVER  NAUSEA AND VOMITING THAT IS NOT CONTROLLED WITH YOUR NAUSEA MEDICATION  *UNUSUAL SHORTNESS OF BREATH  *UNUSUAL BRUISING OR BLEEDING  TENDERNESS IN MOUTH AND THROAT WITH OR WITHOUT PRESENCE OF ULCERS  *URINARY PROBLEMS  *BOWEL PROBLEMS  UNUSUAL RASH Items with * indicate a potential emergency and should be followed up as soon as possible.  Feel free to call the clinic should you have any questions or concerns. The clinic phone number is [phone].  Please show the CHEMO ALERT CARD at check-in to the Emergency Department and triage nurse.   

## 2019-06-18 ENCOUNTER — Ambulatory Visit
Admission: RE | Admit: 2019-06-18 | Discharge: 2019-06-18 | Disposition: A | Discharge status: Home / Self Care | Payer: Medicare Other | Source: Ambulatory Visit | Attending: Radiation Oncology | Admitting: Radiation Oncology

## 2019-06-18 DIAGNOSIS — C320 Malignant neoplasm of glottis: Secondary | ICD-10-CM | POA: Diagnosis not present

## 2019-06-18 DIAGNOSIS — Z79899 Other long term (current) drug therapy: Secondary | ICD-10-CM | POA: Diagnosis not present

## 2019-06-18 DIAGNOSIS — Z510 Encounter for antineoplastic radiation therapy: Secondary | ICD-10-CM | POA: Diagnosis not present

## 2019-06-19 ENCOUNTER — Ambulatory Visit
Admission: RE | Admit: 2019-06-19 | Discharge: 2019-06-19 | Disposition: A | Discharge status: Home / Self Care | Payer: Medicare Other | Source: Ambulatory Visit | Attending: Radiation Oncology | Admitting: Radiation Oncology

## 2019-06-19 ENCOUNTER — Other Ambulatory Visit: Payer: Self-pay

## 2019-06-19 ENCOUNTER — Other Ambulatory Visit: Payer: Self-pay | Admitting: Radiation Oncology

## 2019-06-19 DIAGNOSIS — Z79899 Other long term (current) drug therapy: Secondary | ICD-10-CM | POA: Diagnosis not present

## 2019-06-19 DIAGNOSIS — Z510 Encounter for antineoplastic radiation therapy: Secondary | ICD-10-CM | POA: Diagnosis not present

## 2019-06-19 DIAGNOSIS — C320 Malignant neoplasm of glottis: Secondary | ICD-10-CM | POA: Diagnosis not present

## 2019-06-19 MED ORDER — FLUCONAZOLE 100 MG PO TABS: 100.0000 mg | ORAL_TABLET | Freq: Every day | ORAL | 0 refills | Status: DC

## 2019-06-20 ENCOUNTER — Inpatient Hospital Stay: Payer: Medicare Other

## 2019-06-20 ENCOUNTER — Ambulatory Visit
Admission: RE | Admit: 2019-06-20 | Discharge: 2019-06-20 | Disposition: A | Discharge status: Home / Self Care | Payer: Medicare Other | Source: Ambulatory Visit | Attending: Radiation Oncology | Admitting: Radiation Oncology

## 2019-06-20 ENCOUNTER — Other Ambulatory Visit: Payer: Self-pay

## 2019-06-20 ENCOUNTER — Inpatient Hospital Stay (HOSPITAL_BASED_OUTPATIENT_CLINIC_OR_DEPARTMENT_OTHER): Payer: Medicare Other | Admitting: Hematology

## 2019-06-20 ENCOUNTER — Telehealth: Payer: Self-pay | Admitting: Hematology

## 2019-06-20 ENCOUNTER — Encounter: Payer: Self-pay | Admitting: Hematology

## 2019-06-20 VITALS — BP 109/68 | HR 83 | Temp 98.5°F | Resp 18 | Ht 63.0 in | Wt 116.3 lb

## 2019-06-20 DIAGNOSIS — C320 Malignant neoplasm of glottis: Secondary | ICD-10-CM | POA: Diagnosis not present

## 2019-06-20 DIAGNOSIS — T451X5A Adverse effect of antineoplastic and immunosuppressive drugs, initial encounter: Secondary | ICD-10-CM

## 2019-06-20 DIAGNOSIS — K1231 Oral mucositis (ulcerative) due to antineoplastic therapy: Secondary | ICD-10-CM | POA: Diagnosis not present

## 2019-06-20 DIAGNOSIS — D701 Agranulocytosis secondary to cancer chemotherapy: Secondary | ICD-10-CM

## 2019-06-20 DIAGNOSIS — Z95828 Presence of other vascular implants and grafts: Secondary | ICD-10-CM

## 2019-06-20 DIAGNOSIS — Z79899 Other long term (current) drug therapy: Secondary | ICD-10-CM | POA: Diagnosis not present

## 2019-06-20 DIAGNOSIS — E46 Unspecified protein-calorie malnutrition: Secondary | ICD-10-CM | POA: Diagnosis not present

## 2019-06-20 DIAGNOSIS — Z5111 Encounter for antineoplastic chemotherapy: Secondary | ICD-10-CM | POA: Diagnosis not present

## 2019-06-20 DIAGNOSIS — D6481 Anemia due to antineoplastic chemotherapy: Secondary | ICD-10-CM | POA: Diagnosis not present

## 2019-06-20 DIAGNOSIS — Z510 Encounter for antineoplastic radiation therapy: Secondary | ICD-10-CM | POA: Diagnosis not present

## 2019-06-20 LAB — CBC WITH DIFFERENTIAL (CANCER CENTER ONLY)
Abs Immature Granulocytes: 0.05 10*3/uL (ref 0.00–0.07)
Basophils Absolute: 0 10*3/uL (ref 0.0–0.1)
Basophils Relative: 0 %
Eosinophils Absolute: 0 10*3/uL (ref 0.0–0.5)
Eosinophils Relative: 0 %
HCT: 30.7 % — ABNORMAL LOW (ref 36.0–46.0)
Hemoglobin: 9.9 g/dL — ABNORMAL LOW (ref 12.0–15.0)
Immature Granulocytes: 2 %
Lymphocytes Relative: 15 %
Lymphs Abs: 0.4 10*3/uL — ABNORMAL LOW (ref 0.7–4.0)
MCH: 32.6 pg (ref 26.0–34.0)
MCHC: 32.2 g/dL (ref 30.0–36.0)
MCV: 101 fL — ABNORMAL HIGH (ref 80.0–100.0)
Monocytes Absolute: 0.4 10*3/uL (ref 0.1–1.0)
Monocytes Relative: 17 %
Neutro Abs: 1.7 10*3/uL (ref 1.7–7.7)
Neutrophils Relative %: 66 %
Platelet Count: 206 10*3/uL (ref 150–400)
RBC: 3.04 MIL/uL — ABNORMAL LOW (ref 3.87–5.11)
RDW: 18.6 % — ABNORMAL HIGH (ref 11.5–15.5)
WBC Count: 2.6 10*3/uL — ABNORMAL LOW (ref 4.0–10.5)
nRBC: 1.2 % — ABNORMAL HIGH (ref 0.0–0.2)

## 2019-06-20 LAB — BASIC METABOLIC PANEL - CANCER CENTER ONLY
Anion gap: 10 (ref 5–15)
BUN: 19 mg/dL (ref 8–23)
CO2: 26 mmol/L (ref 22–32)
Calcium: 8.5 mg/dL — ABNORMAL LOW (ref 8.9–10.3)
Chloride: 101 mmol/L (ref 98–111)
Creatinine: 0.86 mg/dL (ref 0.44–1.00)
GFR, Est AFR Am: >60 mL/min (ref >60)
GFR, Estimated: >60 mL/min (ref >60)
Glucose, Bld: 132 mg/dL — ABNORMAL HIGH (ref 70–99)
Potassium: 3.6 mmol/L (ref 3.5–5.1)
Sodium: 137 mmol/L (ref 135–145)

## 2019-06-20 LAB — MAGNESIUM: Magnesium: 1.7 mg/dL (ref 1.7–2.4)

## 2019-06-20 MED ORDER — HEPARIN SOD (PORK) LOCK FLUSH 100 UNIT/ML IV SOLN
500.0000 [IU] | Freq: Once | INTRAVENOUS | Status: AC | PRN
  Administered 2019-06-20: 500 [IU]
  Filled 2019-06-20: qty 5

## 2019-06-20 MED ORDER — SODIUM CHLORIDE 0.9% FLUSH
10.0000 mL | INTRAVENOUS | Status: DC | PRN
  Administered 2019-06-20: 10 mL
  Filled 2019-06-20: qty 10

## 2019-06-20 NOTE — Progress Notes (Signed)
[place] OFFICE PROGRESS NOTE  Patient Care Team: Charolette Forward, MD as PCP - General (Cardiology) Wyatt Portela, MD as Consulting Physician (Oncology) Charolette Forward, MD as Referring Physician (Cardiology) Eppie Gibson, MD as Attending Physician (Radiation Oncology) Leota Sauers, RN as Oncology Nurse Navigator  HEME/ONC OVERVIEW: 1. 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 FDG-avid left glottic malignancy on PET without definite metastatic disease  -04/2019 - present: definitive chemoRT with weekly cisplatin   2. Port and PEG in 04/2019   TREATMENT SUMMARY:  05/02/2019 - present: definitive chemoRT with weekly cisplatin   PERTINENT NON-HEM/ONC PROBLEMS: 1. 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 -S/p 6 cycles of weekly cisplatin concurrent with RT in the definitive setting; cisplatin dose reduced to 17m/m2 due to anemia  -Labs adequate today, proceed with Cycle 7 (and the last cycle) of cisplatin  -RT to complete on 06/25/2019 -Once she completes treatment, we will plan to obtain end-of-treatment PET in 3-4 months to assess disease response  -PRN anti-emetics: Zofran, Compazine, Ativan and dexamethasone  Chemotherapy-associated leukopenia -Secondary to chemotherapy -WBC 2.6k with ANC 1700, stable -Patient denies any symptoms of infection -We will monitor it for now   Chemotherapy-associated anemia -Secondary to chemotherapy, as well as chronic bone marrow suppression due to hx of methotrexate for rheumatoid arthritis  -Hgb 9.9 today, improving  -Patient denies any symptom of bleeding -On folic acid 237mdaily  -We will monitor it  closely  Treatment-associated mucositis -Secondary to chemoradiation -Currently on PRN viscous lidocaine and IR liquid morphine -Pain relatively well controlled -Continue the regimen above   Chemotherapy-associated nausea  -Secondary to chemotherapy -Symptoms relatively well controlled  -Continue PRN-anti-emetics   Thick saliva -Secondary to radiation -Currently on salt/soda rinses -I recommended the patient to add OTC Mucinex, which may help reduce the thickness of the saliva  Protein malnutrition -Secondary to chemoradiation -Weight down by ~2 lbs since the last visit  -She is currently taking ~3 Omsolite per day via the PEG and drinking ~60 ounces of water -I encouraged the patient to increase tube feeding if her oral intake decreases, and to adhere to nutritional recommendations  Orders Placed This Encounter  Procedures  . CBC w/ diff    Standing Status:   Future    Standing Expiration Date:   07/24/2020  . CMP    Standing Status:   Future    Standing Expiration Date:   07/24/2020  . Magnesium    Standing Status:   Future    Standing Expiration Date:   07/24/2020  . TSH    Standing Status:   Future    Standing Expiration Date:   06/19/2020   All questions were answered. The patient knows to call the clinic with any problems, questions or concerns. No barriers to learning was detected.  Return in 2 weeks for labs, port flush and clinic appt.  YaTish MenMD 06/20/2019 11:35 AM  CHIEF COMPLAINT: "My throat is really sore"  INTERVAL HISTORY: [facility] for follow-up of squamous cell carcinoma of the left vocal cord on definitive chemoradiation.  Patient reports that over the past week, she has had more pain in her throat, for which she  takes IR liquid morphine approximately twice a day with reasonable pain control.  She has not been using viscous lidocaine, as she is able to swallow her pills without significant difficulty.  She denies any constipation  associated with opioid medication.  She has been only taking 3 cans of Osmolite per day due to fatigue and sleeping more during the day, which then leads her to forget to take her tube feeding.  She also has some periodic nausea, usually triggered by thick saliva, for which she takes Compazine with relief.  She is taking in approximately 60 ounces of fluid (oral and tube feeding combined).  She denies any other complaint today.  REVIEW OF SYSTEMS:   Constitutional: ( - ) fevers, ( - )  chills , ( - ) night sweats Eyes: ( - ) blurriness of vision, ( - ) double vision, ( - ) watery eyes Ears, nose, mouth, throat, and face: ( + ) mucositis, ( + ) sore throat Respiratory: ( - ) cough, ( - ) dyspnea, ( - ) wheezes Cardiovascular: ( - ) palpitation, ( - ) chest discomfort, ( - ) lower extremity swelling Gastrointestinal:  ( + ) nausea, ( - ) heartburn, ( - ) change in bowel habits Skin: ( - ) abnormal skin rashes Lymphatics: ( - ) new lymphadenopathy, ( - ) easy bruising Neurological: ( - ) numbness, ( - ) tingling, ( - ) new weaknesses Behavioral/Psych: ( - ) mood change, ( - ) new changes  All other systems were reviewed with the patient and are negative.  SUMMARY OF ONCOLOGIC HISTORY: Oncology History  Squamous cell carcinoma of left vocal cord ([place])  03/26/2019 Pathology Results   DIAGNOSIS:   A. VOCAL CORD MASS, LEFT, EXCISION:  - Squamous cell carcinoma.  - See comment.    04/06/2019 Initial Diagnosis   Squamous cell carcinoma of left vocal cord ([place])   04/10/2019 Imaging   PET: IMPRESSION: 1. Left glottic lesion is hypermetabolic with maximum SUV 8.9, compatible with malignancy. No discrete adenopathy in the neck. 2. Extensive periarticular activity especially around the large joints, likely related to rheumatoid arthritis. 3. Small but hypermetabolic axillary and left external iliac lymph nodes are most likely reactive to the patient's rheumatoid arthritis, but merit  surveillance. 4. Mild right hydronephrosis without hydroureter, query right UPJ narrowing. Consider follow renal sonography. 5. Prominent but not hypermetabolic left adnexa, possibly due to prominent ovary or left eccentricity of the uterus, correlate with surgical history. This could be further investigated with pelvic sonography if clinically warranted. 6. Other imaging findings of potential clinical significance: Chronic left maxillary sinusitis. Aortic Atherosclerosis (ICD10-I70.0). Coronary atherosclerosis. Emphysema (ICD10-J43.9). Sigmoid colon diverticulosis.   04/10/2019 Imaging   CT neck w/ contrast: IMPRESSION: 1. Left glottic mass extending to but not beyond the anterior commissure. There is extension into the left paraglottic fat without cartilage erosion. 2. No adenopathy. 3. Aortic Atherosclerosis (ICD10-I70.0) and Emphysema (ICD10-J43.9).   04/11/2019 Cancer Staging   Staging form: Larynx - Glottis, AJCC 8th Edition - Clinical stage from 04/11/2019: Stage III (cT3, cN0, cM0) - Signed by Eppie Gibson, MD on 04/11/2019   05/03/2019 -  Chemotherapy   The patient had palonosetron (ALOXI) injection 0.25 mg, 0.25 mg, Intravenous,  Once, 6 of 7 cycles Administration: 0.25 mg (05/03/2019), 0.25 mg (05/10/2019), 0.25 mg (05/18/2019), 0.25 mg (05/24/2019), 0.25 mg (05/31/2019), 0.25 mg (06/14/2019) CISplatin (PLATINOL) 62 mg in sodium chloride 0.9 % 250 mL chemo infusion, 40 mg/m2 = 62 mg, Intravenous,  Once, 6 of 7 cycles Dose modification: 30 mg/m2 (original dose 40 mg/m2, Cycle 5, Reason: Dose not tolerated) Administration: 62 mg (05/03/2019), 62 mg (05/10/2019), 62 mg (05/18/2019), 62 mg (05/24/2019), 46 mg (05/31/2019), 46 mg (06/14/2019) fosaprepitant (EMEND) 150 mg, dexamethasone (DECADRON) 12 mg in sodium chloride 0.9 % 145 mL IVPB, , Intravenous,  Once, 6 of 7 cycles Administration:  (05/03/2019),  (05/10/2019),  (05/18/2019),  (05/24/2019),  (05/31/2019),  (06/14/2019)   for chemotherapy treatment.      I have reviewed the past medical history, past surgical history, social history and family history with the patient and they are unchanged from previous note.  ALLERGIES:  is allergic to sulfa antibiotics.  MEDICATIONS:  Current Outpatient Medications  Medication Sig Dispense Refill  . Adalimumab (HUMIRA PEN) 40 MG/0.4ML PNKT Inject 40 mg into the skin every 14 (fourteen) days. 3 each 0  . ALLERGIST TRAY 1CC 27GX1/2" 27G X 1/2" 1 ML KIT TO BE USED WITH WEEKLY METHOTREXATE INJECTIONS ONCE A WEEK (Patient not taking: Reported on 05/09/2019) 25 each 0  . amLODipine (NORVASC) 5 MG tablet Take 5 mg by mouth daily.  0  . Cholecalciferol (VITAMIN D) 50 MCG (2000 UT) CAPS Take 2,000 Units by mouth daily.     Marland Kitchen dexamethasone (DECADRON) 4 MG tablet Take 2 tablets by mouth once a day on the day after chemotherapy and then take 2 tablets two times a day for 2 days. Take with food. (Patient not taking: Reported on 06/06/2019) 30 tablet 1  . fluconazole (DIFLUCAN) 100 MG tablet Take 1 tablet (100 mg total) by mouth daily. 2 tablets on day 1 8 tablet 0  . folic acid (FOLVITE) 1 MG tablet TAKE 2 TABLETS(2 MG) BY MOUTH DAILY 180 tablet 3  . lidocaine (XYLOCAINE) 2 % solution Patient: Mix 1part 2% viscous lidocaine, 1part H20. Swish & swallow 66m of diluted mixture, 346m before meals and at bedtime, up to QID 100 mL 5  . lidocaine-prilocaine (EMLA) cream Apply to affected area once 30 g 3  . LORazepam (ATIVAN) 0.5 MG tablet Take 1 tablet (0.5 mg total) by mouth every 6 (six) hours as needed (Nausea or vomiting). (Patient not taking: Reported on 05/16/2019) 30 tablet 0  . losartan (COZAAR) 100 MG tablet Take 100 mg by mouth daily.   0  . magnesium oxide (MAG-OX) 400 (241.3 Mg) MG tablet Take 1 tablet (400 mg total) by mouth daily. 30 tablet 3  . methotrexate 50 MG/2ML injection INJECT 0.6 ML(15 MG TOTAL) UNDER THE SKIN ONCE A WEEK (Patient not taking: Reported on 05/09/2019) 8  mL 0  . Nutritional Supplements (FEEDING SUPPLEMENT, OSMOLITE 1.5 CAL,) LIQD Give 1 bottle Osmolite 1.5 via PEG with 60 mL free water before and after 5 times daily.  Give 30 mL Prosource or equivalent mixed with 90 mL of water via PEG and flush with 60 mL of water.  Drink or flush feeding tube with 240 mL of free water 3 times daily between tube feedings. 1185 mL 3  . ondansetron (ZOFRAN) 8 MG tablet Take 1 tablet (8 mg total) by mouth 2 (two) times daily as needed. Start on the third day after chemotherapy. (Patient not taking: Reported on 06/06/2019) 30 tablet 1  . potassium chloride SA (KLOR-CON) 20 MEQ tablet Take 1 tablet (20 mEq total) by mouth daily. (Patient not taking: Reported on 05/16/2019) 30 tablet 2  . prochlorperazine (COMPAZINE) 10 MG tablet Take 1 tablet (10 mg total) by mouth every 6 (six) hours  as needed (Nausea or vomiting). 30 tablet 1  . sodium fluoride (PREVIDENT 5000 PLUS) 1.1 % CREA dental cream Apply to tooth brush. Brush teeth for 2 minutes. Spit out excess-DO NOT swallow. DO NOT rinse afterwards. Repeat nightly. 1 Tube prn  . vitamin B-12 (CYANOCOBALAMIN) 1000 MCG tablet Take 1,000 mcg by mouth daily.     No current facility-administered medications for this visit.    PHYSICAL EXAMINATION: ECOG PERFORMANCE STATUS: 1 - Symptomatic but completely ambulatory  Today's Vitals   06/20/19 1109 06/20/19 1120  BP: 109/68   Pulse: 83   Resp: 18   Temp: 98.5 F (36.9 C)   TempSrc: Temporal   SpO2: 100%   Weight: 116 lb 4.8 oz (52.8 kg)   Height: 5' 3"  (1.6 m)   PainSc:  9    Body mass index is 20.6 kg/m.  Filed Weights   06/20/19 1109  Weight: 116 lb 4.8 oz (52.8 kg)    GENERAL: alert, no distress and comfortable SKIN: some areas of erythematous skin streaks from radiation induced skin irritation, tender with palpation, no purulent drainage  EYES: conjunctiva are pink and non-injected, sclera clear OROPHARYNX: no exudate, no erythema; lips, buccal mucosa, and  tongue normal  NECK: supple, non-tender LYMPH:  no palpable lymphadenopathy in the cervical LUNGS: clear to auscultation with normal breathing effort HEART: regular rate & rhythm and no murmurs and no lower extremity edema ABDOMEN: soft, non-tender, non-distended, normal bowel sounds Musculoskeletal: no cyanosis of digits and no clubbing  PSYCH: alert & oriented x 3, fluent speech  LABORATORY DATA:  I have reviewed the data as listed    Component Value Date/Time   NA 136 06/13/2019 1401   NA 141 03/05/2014 1050   K 4.1 06/13/2019 1401   K 3.9 03/05/2014 1050   CL 102 06/13/2019 1401   CO2 27 06/13/2019 1401   CO2 24 03/05/2014 1050   GLUCOSE 77 06/13/2019 1401   GLUCOSE 99 03/05/2014 1050   BUN 18 06/13/2019 1401   BUN 10.6 03/05/2014 1050   CREATININE 0.70 06/13/2019 1401   CREATININE 0.77 01/15/2019 0907   CREATININE 0.8 03/05/2014 1050   CALCIUM 8.7 (L) 06/13/2019 1401   CALCIUM 8.8 03/05/2014 1050   PROT 7.2 04/18/2019 0822   PROT 6.5 03/05/2014 1050   ALBUMIN 3.8 04/18/2019 0822   ALBUMIN 3.2 (L) 03/05/2014 1050   AST 19 04/18/2019 0822   AST 12 03/05/2014 1050   ALT 22 04/18/2019 0822   ALT 28 03/05/2014 1050   ALKPHOS 106 04/18/2019 0822   ALKPHOS 85 03/05/2014 1050   BILITOT 0.2 (L) 04/18/2019 0822   BILITOT 0.20 03/05/2014 1050   GFRNONAA >60 06/13/2019 1401   GFRNONAA 78 01/15/2019 0907   GFRAA >60 06/13/2019 1401   GFRAA 91 01/15/2019 0907    No results found for: SPEP, UPEP  Lab Results  Component Value Date   WBC 2.6 (L) 06/20/2019   NEUTROABS 1.7 06/20/2019   HGB 9.9 (L) 06/20/2019   HCT 30.7 (L) 06/20/2019   MCV 101.0 (H) 06/20/2019   PLT 206 06/20/2019      Chemistry      Component Value Date/Time   NA 136 06/13/2019 1401   NA 141 03/05/2014 1050   K 4.1 06/13/2019 1401   K 3.9 03/05/2014 1050   CL 102 06/13/2019 1401   CO2 27 06/13/2019 1401   CO2 24 03/05/2014 1050   BUN 18 06/13/2019 1401   BUN 10.6 03/05/2014 1050   CREATININE  0.70 06/13/2019 1401   CREATININE 0.77 01/15/2019 0907   CREATININE 0.8 03/05/2014 1050      Component Value Date/Time   CALCIUM 8.7 (L) 06/13/2019 1401   CALCIUM 8.8 03/05/2014 1050   ALKPHOS 106 04/18/2019 0822   ALKPHOS 85 03/05/2014 1050   AST 19 04/18/2019 0822   AST 12 03/05/2014 1050   ALT 22 04/18/2019 0822   ALT 28 03/05/2014 1050   BILITOT 0.2 (L) 04/18/2019 0822   BILITOT 0.20 03/05/2014 1050       RADIOGRAPHIC STUDIES: I have personally reviewed the radiological images as listed below and agreed with the findings in the report. No results found.

## 2019-06-20 NOTE — Telephone Encounter (Signed)
Scheduled per los. Gave avs and calendar  

## 2019-06-21 ENCOUNTER — Other Ambulatory Visit: Payer: Self-pay

## 2019-06-21 ENCOUNTER — Other Ambulatory Visit: Payer: Self-pay | Admitting: Hematology

## 2019-06-21 ENCOUNTER — Ambulatory Visit
Admission: RE | Admit: 2019-06-21 | Discharge: 2019-06-21 | Disposition: A | Discharge status: Home / Self Care | Payer: Medicare Other | Source: Ambulatory Visit | Attending: Radiation Oncology | Admitting: Radiation Oncology

## 2019-06-21 ENCOUNTER — Telehealth: Payer: Self-pay | Admitting: *Deleted

## 2019-06-21 DIAGNOSIS — C320 Malignant neoplasm of glottis: Secondary | ICD-10-CM | POA: Diagnosis not present

## 2019-06-21 DIAGNOSIS — Z79899 Other long term (current) drug therapy: Secondary | ICD-10-CM | POA: Diagnosis not present

## 2019-06-21 DIAGNOSIS — C329 Malignant neoplasm of larynx, unspecified: Secondary | ICD-10-CM | POA: Diagnosis not present

## 2019-06-21 DIAGNOSIS — Z510 Encounter for antineoplastic radiation therapy: Secondary | ICD-10-CM | POA: Diagnosis not present

## 2019-06-21 MED ORDER — DEXAMETHASONE 4 MG PO TABS: ORAL_TABLET | ORAL | 1 refills | Status: DC

## 2019-06-21 NOTE — Telephone Encounter (Signed)
Oncology Nurse Navigator Documentation  Rec'd call from Ms. Moroni.  She stated she is scheduled for her final chemo on Saturday, has 2 dexamethasone to take on Sunday, needs an additional 8 (4 for Mon, 4 for Tues).  She requested Rx be sent to [facility].    Dr. Maylon Peppers notified.   [name], RN, BSN Head & Neck Oncology Nurse [place] at [place] [phone]

## 2019-06-21 NOTE — Telephone Encounter (Signed)
Pt informed

## 2019-06-21 NOTE — Telephone Encounter (Signed)
The prescription has been sent to [facility]. Pls notify the patient.  Thank you.  Dr. Maylon Peppers

## 2019-06-23 ENCOUNTER — Other Ambulatory Visit: Payer: Self-pay

## 2019-06-23 ENCOUNTER — Inpatient Hospital Stay: Payer: Medicare Other | Attending: Hematology

## 2019-06-23 VITALS — BP 155/76 | HR 95 | Temp 97.7°F | Resp 18

## 2019-06-23 DIAGNOSIS — Z79899 Other long term (current) drug therapy: Secondary | ICD-10-CM | POA: Insufficient documentation

## 2019-06-23 DIAGNOSIS — Z5111 Encounter for antineoplastic chemotherapy: Secondary | ICD-10-CM | POA: Diagnosis not present

## 2019-06-23 DIAGNOSIS — C320 Malignant neoplasm of glottis: Secondary | ICD-10-CM | POA: Insufficient documentation

## 2019-06-23 MED ORDER — PALONOSETRON HCL INJECTION 0.25 MG/5ML
0.2500 mg | Freq: Once | INTRAVENOUS | Status: AC
  Administered 2019-06-23: 0.25 mg via INTRAVENOUS

## 2019-06-23 MED ORDER — FUROSEMIDE 10 MG/ML IJ SOLN
INTRAMUSCULAR | Status: AC
  Filled 2019-06-23: qty 2

## 2019-06-23 MED ORDER — PALONOSETRON HCL INJECTION 0.25 MG/5ML
INTRAVENOUS | Status: AC
  Filled 2019-06-23: qty 5

## 2019-06-23 MED ORDER — SODIUM CHLORIDE 0.9 % IV SOLN
30.0000 mg/m2 | Freq: Once | INTRAVENOUS | Status: AC
  Administered 2019-06-23: 46 mg via INTRAVENOUS
  Filled 2019-06-23: qty 46

## 2019-06-23 MED ORDER — SODIUM CHLORIDE 0.9 % IV SOLN
Freq: Once | INTRAVENOUS | Status: AC
  Filled 2019-06-23: qty 5

## 2019-06-23 MED ORDER — SODIUM CHLORIDE 0.9 % IV SOLN
Freq: Once | INTRAVENOUS | Status: AC
  Filled 2019-06-23: qty 250

## 2019-06-23 MED ORDER — HEPARIN SOD (PORK) LOCK FLUSH 100 UNIT/ML IV SOLN
500.0000 [IU] | Freq: Once | INTRAVENOUS | Status: AC | PRN
  Administered 2019-06-23: 500 [IU]
  Filled 2019-06-23: qty 5

## 2019-06-23 MED ORDER — SODIUM CHLORIDE 0.9% FLUSH
10.0000 mL | INTRAVENOUS | Status: DC | PRN
  Administered 2019-06-23: 10 mL
  Filled 2019-06-23: qty 10

## 2019-06-23 MED ORDER — FUROSEMIDE 10 MG/ML IJ SOLN
20.0000 mg | Freq: Once | INTRAMUSCULAR | Status: AC
  Administered 2019-06-23: 20 mg via INTRAVENOUS

## 2019-06-23 MED ORDER — POTASSIUM CHLORIDE 2 MEQ/ML IV SOLN
Freq: Once | INTRAVENOUS | Status: AC
  Filled 2019-06-23: qty 10

## 2019-06-23 NOTE — Progress Notes (Signed)
Total urine output thus far is 200 ml. Sandi Mealy, PA made aware. Verbal order received to give patient 20 mg of Lasix IV and it is ok to release Cisplatin.

## 2019-06-23 NOTE — Progress Notes (Signed)
Patient voided 700 ml of urine at 1131.

## 2019-06-23 NOTE — Patient Instructions (Signed)
[facility] Discharge Instructions for Patients Receiving Chemotherapy  Today you received the following chemotherapy agents Cisplatin  To help prevent nausea and vomiting after your treatment, we encourage you to take your nausea medication as directed  If you develop nausea and vomiting that is not controlled by your nausea medication, call the clinic.   BELOW ARE SYMPTOMS THAT SHOULD BE REPORTED IMMEDIATELY:  *FEVER GREATER THAN 100.5 F  *CHILLS WITH OR WITHOUT FEVER  NAUSEA AND VOMITING THAT IS NOT CONTROLLED WITH YOUR NAUSEA MEDICATION  *UNUSUAL SHORTNESS OF BREATH  *UNUSUAL BRUISING OR BLEEDING  TENDERNESS IN MOUTH AND THROAT WITH OR WITHOUT PRESENCE OF ULCERS  *URINARY PROBLEMS  *BOWEL PROBLEMS  UNUSUAL RASH Items with * indicate a potential emergency and should be followed up as soon as possible.  Feel free to call the clinic should you have any questions or concerns. The clinic phone number is [phone].  Please show the CHEMO ALERT CARD at check-in to the Emergency Department and triage nurse.   

## 2019-06-23 NOTE — Progress Notes (Signed)
Patient's urine output after 2 hours of pre-hydration  has been 100 ml. Sandi Mealy, PA made aware and ok given to go ahead and release pre-meds for Cisplatin. Will continue to record urine output.

## 2019-06-25 ENCOUNTER — Other Ambulatory Visit: Payer: Self-pay

## 2019-06-25 ENCOUNTER — Encounter: Payer: Self-pay | Admitting: Radiation Oncology

## 2019-06-25 ENCOUNTER — Ambulatory Visit: Payer: Medicare Other

## 2019-06-25 ENCOUNTER — Ambulatory Visit
Admission: RE | Admit: 2019-06-25 | Discharge: 2019-06-25 | Disposition: A | Discharge status: Home / Self Care | Payer: Medicare Other | Source: Ambulatory Visit | Attending: Radiation Oncology | Admitting: Radiation Oncology

## 2019-06-25 ENCOUNTER — Other Ambulatory Visit: Payer: Self-pay | Admitting: Radiation Oncology

## 2019-06-25 DIAGNOSIS — Z510 Encounter for antineoplastic radiation therapy: Secondary | ICD-10-CM | POA: Insufficient documentation

## 2019-06-25 DIAGNOSIS — C320 Malignant neoplasm of glottis: Secondary | ICD-10-CM | POA: Insufficient documentation

## 2019-06-25 MED ORDER — LIDOCAINE VISCOUS HCL 2 % MT SOLN: OROMUCOSAL | 0 refills | Status: DC

## 2019-06-25 MED ORDER — FLUCONAZOLE 100 MG PO TABS: 100.0000 mg | ORAL_TABLET | Freq: Every day | ORAL | 0 refills | Status: DC

## 2019-06-26 ENCOUNTER — Ambulatory Visit: Payer: Medicare Other

## 2019-07-03 ENCOUNTER — Ambulatory Visit: Payer: Medicare Other | Attending: Radiation Oncology | Admitting: Physical Therapy

## 2019-07-03 ENCOUNTER — Encounter: Payer: Self-pay | Admitting: Physical Therapy

## 2019-07-03 ENCOUNTER — Other Ambulatory Visit: Payer: Self-pay

## 2019-07-03 DIAGNOSIS — M542 Cervicalgia: Secondary | ICD-10-CM | POA: Diagnosis not present

## 2019-07-03 DIAGNOSIS — L599 Disorder of the skin and subcutaneous tissue related to radiation, unspecified: Secondary | ICD-10-CM | POA: Insufficient documentation

## 2019-07-03 DIAGNOSIS — M6281 Muscle weakness (generalized): Secondary | ICD-10-CM | POA: Diagnosis not present

## 2019-07-03 DIAGNOSIS — C320 Malignant neoplasm of glottis: Secondary | ICD-10-CM

## 2019-07-03 DIAGNOSIS — R293 Abnormal posture: Secondary | ICD-10-CM | POA: Diagnosis not present

## 2019-07-03 NOTE — Therapy (Signed)
[place], [place], [address] Phone: [phone]   Fax:  [phone]  Physical Therapy Treatment  Patient Details  Name: Jean [name] MRN: [id number] Date of Birth: 1945-09-20 Referring Provider (PT): Reita May Date: 07/03/2019  PT End of Session - 07/03/19 0947    Visit Number  2    Number of Visits  6    Date for PT Re-Evaluation  07/31/19    PT Start Time  0913   pt arrived late   PT Stop Time  0940    PT Time Calculation (min)  27 min    Activity Tolerance  Patient tolerated treatment well    Behavior During Therapy  [facility] for tasks assessed/performed       Past Medical History:  Diagnosis Date  . Arthritis   . Hypertension   . Rheumatoid arthritis ([place])   . Vocal cord mass     Past Surgical History:  Procedure Laterality Date  . ECTOPIC PREGNANCY SURGERY    . EXTERNAL EAR SURGERY Right    removal of cyst or gland  . IR GASTROSTOMY TUBE MOD SED  04/26/2019  . IR IMAGING GUIDED PORT INSERTION  04/26/2019  . MICROLARYNGOSCOPY Left 03/26/2019   Procedure: DIRECT MICROLARYNGOSCOPY WITH BIOPSY OF VOCAL CORD MASS;  Surgeon: Leta Baptist, MD;  Location: [facility];  Service: ENT;  Laterality: Left;    There were no vitals filed for this visit.  Subjective Assessment - 07/03/19 0915    Subjective  I have pain swallowing food and phlegm will just come up. I have trouble turning my neck to look over my shoulder. I am stiff especially on the left side.    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  Yes    Pain Score  4     Pain Location  Throat    Pain Orientation  Posterior    Pain Descriptors / Indicators  Sharp    Pain Type  Acute pain    Pain  Onset  1 to 4 weeks ago    Pain Frequency  Intermittent    Aggravating Factors   eating    Pain Relieving Factors  lidocaine to numb throat, morphine    Effect of Pain on Daily Activities  hard to eat         [facility] PT Assessment - 07/03/19 0001      AROM   Overall AROM   Within functional limits for tasks performed   in UEs   AROM Assessment Site  Cervical    Cervical Flexion  WFL    Cervical Extension  [facility]   feels slightly tight   Cervical - Right Side Bend  50% limited   stretch on left   Cervical - Left Side Bend  50% limited   stretch on right side   Cervical - Right Rotation  25% limited    Cervical - Left Rotation  25% limited      Transfers   Transfers  Sit to Stand   30 sec - 7 reps       LYMPHEDEMA/ONCOLOGY QUESTIONNAIRE - 07/03/19 0925      Lymphedema Assessments   Lymphedema Assessments  Head and Neck  ADLs/Self Care Home Management;Therapeutic exercise;Patient/family education;Compression bandaging;Manual lymph drainage;Manual techniques;Passive range of motion;Taping    PT Next Visit Plan  as long as wound on anterior neck has healed - begin PROM to neck, myofascial and STM to neck, MLD as needed for post surgical edema    PT Home Exercise Plan  head and neck stretches, walking program     Consulted and Agree with Plan of Care  Patient       Patient will benefit from skilled therapeutic intervention in order to improve the following deficits and impairments:  Postural dysfunction, Decreased knowledge of precautions, Increased edema, Decreased strength, Increased fascial restricitons, Pain, Decreased range of motion, Decreased endurance  Visit Diagnosis: Cervicalgia  Disorder of the skin and subcutaneous tissue related to radiation, unspecified  Muscle weakness (generalized)  Abnormal posture  Malignant neoplasm of glottis [facility])     Problem List Patient Active Problem List   Diagnosis Date Noted  . Leukopenia due to antineoplastic chemotherapy ([place]) 06/06/2019  . Chemotherapy-induced neuropathy ([place]) 06/06/2019  . Mucositis due to chemotherapy 05/16/2019  . Protein malnutrition ([place]) 05/16/2019  . Constipation due to opioid therapy 05/09/2019  . Port-A-Cath in place 05/02/2019  . Squamous cell carcinoma of left vocal cord ([place]) 04/06/2019  . Vitamin D deficiency 01/23/2017  . Rheumatoid arthritis, seronegative, multiple sites ([place]) 01/19/2017  . High risk medication use 01/19/2017  . Primary osteoarthritis of both hands 01/19/2017  . Primary osteoarthritis of both knees 01/19/2017  . Fatigue 01/19/2017  . Thrombocytosis ([place]) 01/19/2017  . Primary osteoarthritis of both feet 01/19/2017  . Anemia due to antineoplastic chemotherapy 01/19/2017  . Screening-pulmonary TB 04/26/2016    Allyson Sabal [facility] 07/03/2019, 9:54 AM  [place], [place], [address] Phone: [phone]   Fax:  [phone]  Name: Jean [name] MRN: [id number] Date of Birth: 1946/04/08  Manus Gunning, PT 07/03/19 9:54 AM  ADLs/Self Care Home Management;Therapeutic exercise;Patient/family education;Compression bandaging;Manual lymph drainage;Manual techniques;Passive range of motion;Taping    PT Next Visit Plan  as long as wound on anterior neck has healed - begin PROM to neck, myofascial and STM to neck, MLD as needed for post surgical edema    PT Home Exercise Plan  head and neck stretches, walking program     Consulted and Agree with Plan of Care  Patient       Patient will benefit from skilled therapeutic intervention in order to improve the following deficits and impairments:  Postural dysfunction, Decreased knowledge of precautions, Increased edema, Decreased strength, Increased fascial restricitons, Pain, Decreased range of motion, Decreased endurance  Visit Diagnosis: Cervicalgia  Disorder of the skin and subcutaneous tissue related to radiation, unspecified  Muscle weakness (generalized)  Abnormal posture  Malignant neoplasm of glottis [facility])     Problem List Patient Active Problem List   Diagnosis Date Noted  . Leukopenia due to antineoplastic chemotherapy ([place]) 06/06/2019  . Chemotherapy-induced neuropathy ([place]) 06/06/2019  . Mucositis due to chemotherapy 05/16/2019  . Protein malnutrition ([place]) 05/16/2019  . Constipation due to opioid therapy 05/09/2019  . Port-A-Cath in place 05/02/2019  . Squamous cell carcinoma of left vocal cord ([place]) 04/06/2019  . Vitamin D deficiency 01/23/2017  . Rheumatoid arthritis, seronegative, multiple sites ([place]) 01/19/2017  . High risk medication use 01/19/2017  . Primary osteoarthritis of both hands 01/19/2017  . Primary osteoarthritis of both knees 01/19/2017  . Fatigue 01/19/2017  . Thrombocytosis ([place]) 01/19/2017  . Primary osteoarthritis of both feet 01/19/2017  . Anemia due to antineoplastic chemotherapy 01/19/2017  . Screening-pulmonary TB 04/26/2016    Allyson Sabal [facility] 07/03/2019, 9:54 AM  [place], [place], [address] Phone: [phone]   Fax:  [phone]  Name: Jean [name] MRN: [id number] Date of Birth: 1946/04/08  Manus Gunning, PT 07/03/19 9:54 AM

## 2019-07-04 ENCOUNTER — Other Ambulatory Visit: Payer: Self-pay

## 2019-07-04 ENCOUNTER — Telehealth: Payer: Self-pay | Admitting: Hematology

## 2019-07-04 ENCOUNTER — Inpatient Hospital Stay: Payer: Medicare Other

## 2019-07-04 ENCOUNTER — Encounter: Payer: Self-pay | Admitting: Hematology

## 2019-07-04 ENCOUNTER — Encounter: Payer: Self-pay | Admitting: *Deleted

## 2019-07-04 ENCOUNTER — Inpatient Hospital Stay (HOSPITAL_BASED_OUTPATIENT_CLINIC_OR_DEPARTMENT_OTHER): Payer: Medicare Other | Admitting: Hematology

## 2019-07-04 VITALS — BP 128/69 | HR 87 | Temp 98.2°F | Resp 16 | Ht 63.0 in | Wt 116.7 lb

## 2019-07-04 DIAGNOSIS — Z95828 Presence of other vascular implants and grafts: Secondary | ICD-10-CM

## 2019-07-04 DIAGNOSIS — E46 Unspecified protein-calorie malnutrition: Secondary | ICD-10-CM

## 2019-07-04 DIAGNOSIS — C320 Malignant neoplasm of glottis: Secondary | ICD-10-CM

## 2019-07-04 DIAGNOSIS — Z79899 Other long term (current) drug therapy: Secondary | ICD-10-CM | POA: Diagnosis not present

## 2019-07-04 DIAGNOSIS — D6481 Anemia due to antineoplastic chemotherapy: Secondary | ICD-10-CM | POA: Diagnosis not present

## 2019-07-04 DIAGNOSIS — T451X5A Adverse effect of antineoplastic and immunosuppressive drugs, initial encounter: Secondary | ICD-10-CM

## 2019-07-04 DIAGNOSIS — K1231 Oral mucositis (ulcerative) due to antineoplastic therapy: Secondary | ICD-10-CM

## 2019-07-04 DIAGNOSIS — Z5111 Encounter for antineoplastic chemotherapy: Secondary | ICD-10-CM | POA: Diagnosis not present

## 2019-07-04 LAB — CMP (CANCER CENTER ONLY)
ALT: 15 U/L (ref 0–44)
AST: 13 U/L — ABNORMAL LOW (ref 15–41)
Albumin: 3.2 g/dL — ABNORMAL LOW (ref 3.5–5.0)
Alkaline Phosphatase: 80 U/L (ref 38–126)
Anion gap: 9 (ref 5–15)
BUN: 21 mg/dL (ref 8–23)
CO2: 26 mmol/L (ref 22–32)
Calcium: 8.4 mg/dL — ABNORMAL LOW (ref 8.9–10.3)
Chloride: 107 mmol/L (ref 98–111)
Creatinine: 0.73 mg/dL (ref 0.44–1.00)
GFR, Est AFR Am: >60 mL/min (ref >60)
GFR, Estimated: >60 mL/min (ref >60)
Glucose, Bld: 114 mg/dL — ABNORMAL HIGH (ref 70–99)
Potassium: 4.1 mmol/L (ref 3.5–5.1)
Sodium: 142 mmol/L (ref 135–145)
Total Bilirubin: <0.2 mg/dL — ABNORMAL LOW (ref 0.3–1.2)
Total Protein: 5.8 g/dL — ABNORMAL LOW (ref 6.5–8.1)

## 2019-07-04 LAB — CBC WITH DIFFERENTIAL (CANCER CENTER ONLY)
Abs Immature Granulocytes: 0.01 10*3/uL (ref 0.00–0.07)
Basophils Absolute: 0 10*3/uL (ref 0.0–0.1)
Basophils Relative: 0 %
Eosinophils Absolute: 0 10*3/uL (ref 0.0–0.5)
Eosinophils Relative: 1 %
HCT: 26.3 % — ABNORMAL LOW (ref 36.0–46.0)
Hemoglobin: 8.4 g/dL — ABNORMAL LOW (ref 12.0–15.0)
Immature Granulocytes: 0 %
Lymphocytes Relative: 9 %
Lymphs Abs: 0.4 10*3/uL — ABNORMAL LOW (ref 0.7–4.0)
MCH: 33.3 pg (ref 26.0–34.0)
MCHC: 31.9 g/dL (ref 30.0–36.0)
MCV: 104.4 fL — ABNORMAL HIGH (ref 80.0–100.0)
Monocytes Absolute: 0.5 10*3/uL (ref 0.1–1.0)
Monocytes Relative: 13 %
Neutro Abs: 3.2 10*3/uL (ref 1.7–7.7)
Neutrophils Relative %: 77 %
Platelet Count: 220 10*3/uL (ref 150–400)
RBC: 2.52 MIL/uL — ABNORMAL LOW (ref 3.87–5.11)
RDW: 19.4 % — ABNORMAL HIGH (ref 11.5–15.5)
WBC Count: 4.2 10*3/uL (ref 4.0–10.5)
nRBC: 0 % (ref 0.0–0.2)

## 2019-07-04 LAB — MAGNESIUM: Magnesium: 1.7 mg/dL (ref 1.7–2.4)

## 2019-07-04 MED ORDER — SODIUM CHLORIDE 0.9% FLUSH
10.0000 mL | INTRAVENOUS | Status: DC | PRN
  Administered 2019-07-04: 10 mL
  Filled 2019-07-04: qty 10

## 2019-07-04 MED ORDER — HEPARIN SOD (PORK) LOCK FLUSH 100 UNIT/ML IV SOLN
500.0000 [IU] | Freq: Once | INTRAVENOUS | Status: AC | PRN
  Administered 2019-07-04: 500 [IU]
  Filled 2019-07-04: qty 5

## 2019-07-04 NOTE — Telephone Encounter (Signed)
Gave avs and calendar ° °

## 2019-07-04 NOTE — Progress Notes (Signed)
[place] OFFICE PROGRESS NOTE  Jean [name] Care Team: Jean Forward, MD as PCP - General (Cardiology) Jean Portela, MD as Consulting Physician (Oncology) Jean Forward, MD as Referring Physician (Cardiology) Jean Gibson, MD as Attending Physician (Radiation Oncology) Jean Sauers, RN as Oncology Nurse Navigator  HEME/ONC OVERVIEW: 1. 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 FDG-avid left glottic malignancy on PET without definite metastatic disease  -04/2019 - 06/2019: definitive chemoRT with weekly cisplatin   2. Port and PEG in 04/2019   TREATMENT SUMMARY:  05/02/2019 - 06/25/2019: definitive chemoRT with weekly cisplatin   On surveillance   PERTINENT NON-HEM/ONC PROBLEMS: 1. 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 -S/p 7 cycles of weekly cisplatin concurrent with RT in Jean definitive setting -Clinically, Jean [name] still has some hoarseness of voice, possibly due to vocal cord paralysis; no lymphadenopathy on exam  -Labs reviewed and see Jean management below  -We will plan to obtain end-of-treatment PET in 3-4 months to assess disease response  -Jean [name] will also require ENT exam in Jean next a few months to evaluate any local evidence of disease   Chemotherapy-associated anemia -Secondary to chemotherapy, as well as chronic bone marrow suppression due to hx of methotrexate for rheumatoid arthritis  -Hgb 8.4 today, lower than Jean last visit  -Jean [name] denies any symptom of bleeding -On folic acid 73m daily  -We will monitor it closely  Treatment-associated mucositis -Secondary to chemoradiation -Currently on PRN viscous lidocaine and IR liquid  morphine -Pain relatively well controlled -Continue Jean regimen above   Thick saliva -Secondary to radiation -Currently on salt/soda rinses -I recommended Jean [name] to add OTC Mucinex, which may help reduce Jean thickness of Jean saliva  Protein malnutrition -Secondary to chemoradiation -Weight stable since Jean last visit  -Jean [name] is currently taking 4 Omsolite per day via Jean PEG, as well as food and water by mouth -I encouraged Jean [name] to continue tube feeding and increase oral intake as tolerated -Once Jean [name] is able to sustain nutrition via po intake, we can remove Jean feeding tube  Port-a-cath in place -Q6-8week flush for now until PET results   Orders Placed This Encounter  Procedures  . CBC w/ diff    Standing Status:   Future    Standing Expiration Date:   08/07/2020  . CMP    Standing Status:   Future    Standing Expiration Date:   08/07/2020  . TSH    Standing Status:   Future    Standing Expiration Date:   07/03/2020   All questions were answered. Jean [name] knows to call Jean clinic with any problems, questions or concerns. No barriers to learning was detected.  Return in 4 weeks for labs, port flush and clinic appt.   YTish Men MD 1/13/20214:15 PM  CHIEF COMPLAINT: "I am getting a little better"  INTERVAL HISTORY: Ms. SVastinereturns clinic for follow-up of squamous cell carcinoma on Jean left vocal cord s/p definitive chemoradiation.  Jean [name] reports that Jean [name] still has hoarseness of voice, but it is modestly improving since completing chemoradiation.  Jean [name] still has pain in Jean [name] throat, exacerbated by swallowing, for which Jean [name] takes viscous lidocaine lidocaine and IR liquid morphine (  approximately twice a day) with adequate pain control.  Jean [name] is able to eat and drink by mouth, and also takes 4 Osmolite via Jean feeding tube per day.  Jean [name] still has some difficulty with thick secretion, but has not yet tried Mucinex.  Jean [name] bowel movement is soft with laxatives.  Jean [name]  denies any other complaint today.  REVIEW OF SYSTEMS:   Constitutional: ( - ) fevers, ( - )  chills , ( - ) night sweats Eyes: ( - ) blurriness of vision, ( - ) double vision, ( - ) watery eyes Ears, nose, mouth, throat, and face: ( + ) mucositis, ( + ) sore throat Respiratory: ( - ) cough, ( - ) dyspnea, ( - ) wheezes Cardiovascular: ( - ) palpitation, ( - ) chest discomfort, ( - ) lower extremity swelling Gastrointestinal:  ( - ) nausea, ( - ) heartburn, ( - ) change in bowel habits Skin: ( - ) abnormal skin rashes Lymphatics: ( - ) new lymphadenopathy, ( - ) easy bruising Neurological: ( - ) numbness, ( - ) tingling, ( - ) new weaknesses Behavioral/Psych: ( - ) mood change, ( - ) new changes  All other systems were reviewed with Jean [name] and are negative.  SUMMARY OF ONCOLOGIC HISTORY: Oncology History  Squamous cell carcinoma of left vocal cord ([place])  03/26/2019 Pathology Results   DIAGNOSIS:   A. VOCAL CORD MASS, LEFT, EXCISION:  - Squamous cell carcinoma.  - See comment.    04/06/2019 Initial Diagnosis   Squamous cell carcinoma of left vocal cord ([place])   04/10/2019 Imaging   PET: IMPRESSION: 1. Left glottic lesion is hypermetabolic with maximum SUV 8.9, compatible with malignancy. No discrete adenopathy in Jean neck. 2. Extensive periarticular activity especially around Jean large joints, likely related to rheumatoid arthritis. 3. Small but hypermetabolic axillary and left external iliac lymph nodes are most likely reactive to Jean [name]'s rheumatoid arthritis, but merit surveillance. 4. Mild right hydronephrosis without hydroureter, query right UPJ narrowing. Consider follow renal sonography. 5. Prominent but not hypermetabolic left adnexa, possibly due to prominent ovary or left eccentricity of Jean uterus, correlate with surgical history. This could be further investigated with pelvic sonography if clinically warranted. 6. Other imaging findings of potential  clinical significance: Chronic left maxillary sinusitis. Aortic Atherosclerosis (ICD10-I70.0). Coronary atherosclerosis. Emphysema (ICD10-J43.9). Sigmoid colon diverticulosis.   04/10/2019 Imaging   CT neck w/ contrast: IMPRESSION: 1. Left glottic mass extending to but not beyond Jean anterior commissure. There is extension into Jean left paraglottic fat without cartilage erosion. 2. No adenopathy. 3. Aortic Atherosclerosis (ICD10-I70.0) and Emphysema (ICD10-J43.9).   04/11/2019 Cancer Staging   Staging form: Larynx - Glottis, AJCC 8th Edition - Clinical stage from 04/11/2019: Stage III (cT3, cN0, cM0) - Signed by Jean Gibson, MD on 04/11/2019   05/03/2019 -  Chemotherapy   Jean [name] had palonosetron (ALOXI) injection 0.25 mg, 0.25 mg, Intravenous,  Once, 7 of 7 cycles Administration: 0.25 mg (05/03/2019), 0.25 mg (05/10/2019), 0.25 mg (05/18/2019), 0.25 mg (05/24/2019), 0.25 mg (05/31/2019), 0.25 mg (06/14/2019), 0.25 mg (06/23/2019) CISplatin (PLATINOL) 62 mg in sodium chloride 0.9 % 250 mL chemo infusion, 40 mg/m2 = 62 mg, Intravenous,  Once, 7 of 7 cycles Dose modification: 30 mg/m2 (original dose 40 mg/m2, Cycle 5, Reason: Dose not tolerated) Administration: 62 mg (05/03/2019), 62 mg (05/10/2019), 62 mg (05/18/2019), 62 mg (05/24/2019), 46 mg (05/31/2019), 46 mg (06/14/2019), 46 mg (06/23/2019) fosaprepitant (EMEND) 150 mg, dexamethasone (DECADRON) 12 mg in sodium  chloride 0.9 % 145 mL IVPB, , Intravenous,  Once, 7 of 7 cycles Administration:  (05/03/2019),  (05/10/2019),  (05/18/2019),  (05/24/2019),  (05/31/2019),  (06/14/2019),  (06/23/2019)  for chemotherapy treatment.      I have reviewed Jean past medical history, past surgical history, social history and family history with Jean [name] and they are unchanged from previous note.  ALLERGIES:  is allergic to sulfa antibiotics.  MEDICATIONS:  Current Outpatient Medications  Medication Sig Dispense Refill  . Adalimumab (HUMIRA  PEN) 40 MG/0.4ML PNKT Inject 40 mg into Jean skin every 14 (fourteen) days. 3 each 0  . amLODipine (NORVASC) 5 MG tablet Take 5 mg by mouth daily.  0  . Cholecalciferol (VITAMIN D) 50 MCG (2000 UT) CAPS Take 2,000 Units by mouth daily.     . fluconazole (DIFLUCAN) 100 MG tablet Take 1 tablet (100 mg total) by mouth daily. 14 tablet 0  . folic acid (FOLVITE) 1 MG tablet TAKE 2 TABLETS(2 MG) BY MOUTH DAILY 180 tablet 3  . lidocaine (XYLOCAINE) 2 % solution Jean [name]: Mix 1part 2% viscous lidocaine, 1part H20. Swish & swallow 62m of diluted mixture, 356m before meals and at bedtime, up to QID 250 mL 0  . lidocaine-prilocaine (EMLA) cream Apply to affected area once 30 g 3  . losartan (COZAAR) 100 MG tablet Take 100 mg by mouth daily.   0  . Nutritional Supplements (FEEDING SUPPLEMENT, OSMOLITE 1.5 CAL,) LIQD Give 1 bottle Osmolite 1.5 via PEG with 60 mL free water before and after 5 times daily.  Give 30 mL Prosource or equivalent mixed with 90 mL of water via PEG and flush with 60 mL of water.  Drink or flush feeding tube with 240 mL of free water 3 times daily between tube feedings. 1185 mL 3  . sodium fluoride (PREVIDENT 5000 PLUS) 1.1 % CREA dental cream Apply to tooth brush. Brush teeth for 2 minutes. Spit out excess-DO NOT swallow. DO NOT rinse afterwards. Repeat nightly. 1 Tube prn  . vitamin B-12 (CYANOCOBALAMIN) 1000 MCG tablet Take 1,000 mcg by mouth daily.    . ALLERGIST TRAY 1CC 27GX1/2" 27G X 1/2" 1 ML KIT TO BE USED WITH WEEKLY METHOTREXATE INJECTIONS ONCE A WEEK (Jean [name] not taking: Reported on 05/09/2019) 25 each 0  . LORazepam (ATIVAN) 0.5 MG tablet Take 1 tablet (0.5 mg total) by mouth every 6 (six) hours as needed (Nausea or vomiting). (Jean [name] not taking: Reported on 05/16/2019) 30 tablet 0  . magnesium oxide (MAG-OX) 400 (241.3 Mg) MG tablet Take 1 tablet (400 mg total) by mouth daily. (Jean [name] not taking: Reported on 07/04/2019) 30 tablet 3  . methotrexate 50 MG/2ML injection INJECT  0.6 ML(15 MG TOTAL) UNDER Jean SKIN ONCE A WEEK (Jean [name] not taking: Reported on 05/09/2019) 8 mL 0  . ondansetron (ZOFRAN) 8 MG tablet Take 1 tablet (8 mg total) by mouth 2 (two) times daily as needed. Start on Jean third day after chemotherapy. (Jean [name] not taking: Reported on 06/06/2019) 30 tablet 1  . potassium chloride SA (KLOR-CON) 20 MEQ tablet Take 1 tablet (20 mEq total) by mouth daily. (Jean [name] not taking: Reported on 05/16/2019) 30 tablet 2  . prochlorperazine (COMPAZINE) 10 MG tablet Take 1 tablet (10 mg total) by mouth every 6 (six) hours as needed (Nausea or vomiting). (Jean [name] not taking: Reported on 07/04/2019) 30 tablet 1   No current facility-administered medications for this visit.    PHYSICAL EXAMINATION: ECOG PERFORMANCE STATUS: 1 - Symptomatic but completely ambulatory  Today's Vitals   07/04/19 1548 07/04/19 1553  BP: 128/69   Pulse: 87   Resp: 16   Temp: 98.2 F (36.8 C)   TempSrc: Temporal   SpO2: 100%   Weight: 116 lb 11.2 oz (52.9 kg)   Height: 5' 3"  (1.6 m)   PainSc:  0-No pain   Body mass index is 20.67 kg/m.  Filed Weights   07/04/19 1548  Weight: 116 lb 11.2 oz (52.9 kg)    GENERAL: alert, no distress and comfortable SKIN: skin color, texture, turgor are normal, no rashes or significant lesions EYES: conjunctiva are pink and non-injected, sclera clear OROPHARYNX: no exudate, no erythema; lips, buccal mucosa, and tongue normal  NECK: supple, non-tender LYMPH:  no palpable lymphadenopathy in Jean cervical LUNGS: clear to auscultation with normal breathing effort HEART: regular rate & rhythm and no murmurs and no lower extremity edema ABDOMEN: soft, non-tender, non-distended, normal bowel sounds Musculoskeletal: no cyanosis of digits and no clubbing  PSYCH: alert & oriented x 3, quiet/whispering speech  LABORATORY DATA:  I have reviewed Jean data as listed    Component Value Date/Time   NA 137 06/20/2019 1103   NA 141 03/05/2014 1050   K 3.6  06/20/2019 1103   K 3.9 03/05/2014 1050   CL 101 06/20/2019 1103   CO2 26 06/20/2019 1103   CO2 24 03/05/2014 1050   GLUCOSE 132 (H) 06/20/2019 1103   GLUCOSE 99 03/05/2014 1050   BUN 19 06/20/2019 1103   BUN 10.6 03/05/2014 1050   CREATININE 0.86 06/20/2019 1103   CREATININE 0.77 01/15/2019 0907   CREATININE 0.8 03/05/2014 1050   CALCIUM 8.5 (L) 06/20/2019 1103   CALCIUM 8.8 03/05/2014 1050   PROT 7.2 04/18/2019 0822   PROT 6.5 03/05/2014 1050   ALBUMIN 3.8 04/18/2019 0822   ALBUMIN 3.2 (L) 03/05/2014 1050   AST 19 04/18/2019 0822   AST 12 03/05/2014 1050   ALT 22 04/18/2019 0822   ALT 28 03/05/2014 1050   ALKPHOS 106 04/18/2019 0822   ALKPHOS 85 03/05/2014 1050   BILITOT 0.2 (L) 04/18/2019 0822   BILITOT 0.20 03/05/2014 1050   GFRNONAA >60 06/20/2019 1103   GFRNONAA 78 01/15/2019 0907   GFRAA >60 06/20/2019 1103   GFRAA 91 01/15/2019 0907    No results found for: SPEP, UPEP  Lab Results  Component Value Date   WBC 4.2 07/04/2019   NEUTROABS 3.2 07/04/2019   HGB 8.4 (L) 07/04/2019   HCT 26.3 (L) 07/04/2019   MCV 104.4 (H) 07/04/2019   PLT 220 07/04/2019      Chemistry      Component Value Date/Time   NA 137 06/20/2019 1103   NA 141 03/05/2014 1050   K 3.6 06/20/2019 1103   K 3.9 03/05/2014 1050   CL 101 06/20/2019 1103   CO2 26 06/20/2019 1103   CO2 24 03/05/2014 1050   BUN 19 06/20/2019 1103   BUN 10.6 03/05/2014 1050   CREATININE 0.86 06/20/2019 1103   CREATININE 0.77 01/15/2019 0907   CREATININE 0.8 03/05/2014 1050      Component Value Date/Time   CALCIUM 8.5 (L) 06/20/2019 1103   CALCIUM 8.8 03/05/2014 1050   ALKPHOS 106 04/18/2019 0822   ALKPHOS 85 03/05/2014 1050   AST 19 04/18/2019 0822   AST 12 03/05/2014 1050   ALT 22 04/18/2019 0822   ALT 28 03/05/2014 1050   BILITOT 0.2 (L) 04/18/2019 0822   BILITOT 0.20 03/05/2014 1050  RADIOGRAPHIC STUDIES: I have personally reviewed Jean radiological images as listed below and agreed with  Jean findings in Jean report. No results found.

## 2019-07-05 LAB — TSH: TSH: 0.491 u[IU]/mL (ref 0.308–3.960)

## 2019-07-07 NOTE — Progress Notes (Signed)
Oncology Nurse Navigator Documentation  Met with Jean [name] during post-tmt follow-up with Dr. Maylon Peppers. Provided verbal post-RT guidance:  Importance of keeping all follow-up appts, especially those with Nutrition and SLP.  Importance of protecting treatment area from sun.  Continuation of Sonafine application 2-3 times daily, application of abx ointment to areas of raw skin; when supply of Sonafine exhausted transition to OTC lotion with vitamin E. Provided her 2 boxes of drainage sponges for PEG care. Explained my role as navigator will continue for several more months, I will be calling or joining her during follow-up visits.   She agreed to call me with needs/concerns.    [name], RN, BSN Head & Neck Oncology [place] at [place] [phone]

## 2019-07-12 ENCOUNTER — Encounter: Payer: Self-pay | Admitting: Physical Therapy

## 2019-07-12 ENCOUNTER — Other Ambulatory Visit: Payer: Self-pay

## 2019-07-12 ENCOUNTER — Ambulatory Visit: Payer: Medicare Other | Admitting: Physical Therapy

## 2019-07-12 DIAGNOSIS — C320 Malignant neoplasm of glottis: Secondary | ICD-10-CM | POA: Diagnosis not present

## 2019-07-12 DIAGNOSIS — M6281 Muscle weakness (generalized): Secondary | ICD-10-CM | POA: Diagnosis not present

## 2019-07-12 DIAGNOSIS — M542 Cervicalgia: Secondary | ICD-10-CM | POA: Diagnosis not present

## 2019-07-12 DIAGNOSIS — L599 Disorder of the skin and subcutaneous tissue related to radiation, unspecified: Secondary | ICD-10-CM | POA: Diagnosis not present

## 2019-07-12 DIAGNOSIS — R293 Abnormal posture: Secondary | ICD-10-CM

## 2019-07-12 NOTE — Therapy (Signed)
[place], [place], [address] Phone: [phone]   Fax:  [phone]  Physical Therapy Treatment  Patient Details  Name: Jean [name] MRN: [id number] Date of Birth: 12/04/1945 Referring Provider (PT): Reita May Date: 07/12/2019  PT End of Session - 07/12/19 0954    Visit Number  3    Number of Visits  6    Date for PT Re-Evaluation  07/31/19    PT Start Time  0906    PT Stop Time  0950    PT Time Calculation (min)  44 min    Activity Tolerance  Patient tolerated treatment well    Behavior During Therapy  [facility] for tasks assessed/performed       Past Medical History:  Diagnosis Date  . Arthritis   . Hypertension   . Rheumatoid arthritis ([place])   . Vocal cord mass     Past Surgical History:  Procedure Laterality Date  . ECTOPIC PREGNANCY SURGERY    . EXTERNAL EAR SURGERY Right    removal of cyst or gland  . IR GASTROSTOMY TUBE MOD SED  04/26/2019  . IR IMAGING GUIDED PORT INSERTION  04/26/2019  . MICROLARYNGOSCOPY Left 03/26/2019   Procedure: DIRECT MICROLARYNGOSCOPY WITH BIOPSY OF VOCAL CORD MASS;  Surgeon: Leta Baptist, MD;  Location: [facility];  Service: ENT;  Laterality: Left;    There were no vitals filed for this visit.  Subjective Assessment - 07/12/19 0908    Subjective  The inside of my throat is really sore. I use the lidocaine before I eat. The spot on my neck is healed up.    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  Yes    Pain Score  3     Pain Location  Throat    Pain Orientation  Right    Pain Descriptors / Indicators  Burning    Pain Type  Acute pain    Pain Onset  More than a month ago    Pain Frequency  Intermittent                        [facility] Adult PT Treatment/Exercise - 07/12/19 0001      Manual Therapy   Manual Therapy  Myofascial release;Soft tissue mobilization;Passive ROM    Soft tissue mobilization  along bilateral scalenes, SCM, upper traps and levator    Myofascial Release  suboccipital release x 5 reps with 30 sec holds    Passive ROM  in supine in direction of bilateral rotation and bilateral lateral flexion with prolonged holds while performing STM                  PT Long Term Goals - 07/03/19 0944      PT LONG TERM GOAL #1   Title  Pt will demonstrate UE and LE strength grossly 4/5-5/5 to allow pt to return to PLOF after completing treatments.    Time  8    Period  Weeks    Status  On-going      PT LONG TERM GOAL #2   Title  Pt will be able to complete 12 sit to stands in 30 seconds without using hands  to allow pt to return to prior level of function.    Baseline  7 reps which is below avg for her ago    Time  4    Period  Weeks    Status  New    Target Date  07/31/19      PT LONG TERM GOAL #3   Title  Pt will demonstrate neck ROM WFL in direction of lateral flexion and rotation bilaterally.    Baseline  Rotation to L and R- 25% limited, lateral flexion to L and R - 50% limited    Time  4    Period  Weeks    Status  New    Target Date  07/31/19      PT LONG TERM GOAL #4   Title  Pt will be independent in a home exercise program for continued strengthening and stretching.    Time  4    Period  Weeks    Status  New    Target Date  07/31/19            Plan - 07/12/19 0954    Clinical Impression Statement  Began manual therapy today including soft tissue mobilization to tight cervical musculature and PROM to neck. Pt was very tight at beginning of session but was able to achieve nearly full cervical AROM by end of session due to decreased tightness. Pt felt much better at end of session. She has been compliant with her home exercise program.  Encouraged pt to continue stretching at home.    PT Frequency  1x / week    PT Duration  4 weeks    PT Treatment/Interventions  ADLs/Self Care Home Management;Therapeutic exercise;Patient/family education;Compression bandaging;Manual lymph drainage;Manual techniques;Passive range of motion;Taping    PT Next Visit Plan  continue PROM to neck, myofascial and STM to neck, MLD as needed for post surgical edema    PT Home Exercise Plan  head and neck stretches, walking program    Consulted and Agree with Plan of Care  Patient       Patient will benefit from skilled therapeutic intervention in order to improve the following deficits and impairments:  Postural dysfunction, Decreased knowledge of precautions, Increased edema, Decreased strength, Increased fascial restricitons, Pain, Decreased range of motion, Decreased endurance  Visit Diagnosis: Cervicalgia  Disorder of the skin and subcutaneous tissue related to radiation, unspecified  Abnormal posture     Problem List Patient Active Problem List   Diagnosis Date Noted  . Mucositis due to chemotherapy 05/16/2019  . Protein malnutrition ([place]) 05/16/2019  . Constipation due to opioid therapy 05/09/2019  . Port-A-Cath in place 05/02/2019  . Squamous cell carcinoma of left vocal cord ([place]) 04/06/2019  . Vitamin D deficiency 01/23/2017  . Rheumatoid arthritis, seronegative, multiple sites ([place]) 01/19/2017  . High risk medication use 01/19/2017  . Primary osteoarthritis of both hands 01/19/2017  . Primary osteoarthritis of both knees 01/19/2017  . Fatigue 01/19/2017  . Thrombocytosis ([place]) 01/19/2017  . Primary osteoarthritis of both feet 01/19/2017  . Anemia due to antineoplastic chemotherapy 01/19/2017  . Screening-pulmonary TB 04/26/2016    Allyson Sabal [facility] 07/12/2019, 9:56 AM  [facility] [place], [place], [address] Phone: [phone]   Fax:   [phone]  Name: MEG BOURLIER MRN: [id number] Date of Birth: [birth year]  Manus Gunning, PT 07/12/19 9:56 AM

## 2019-07-17 NOTE — Progress Notes (Signed)
Patient Name: Jean [name] MRN: [id number] DOB: [birth year] Referring Physician: Suszanne Conners SUI (Profile Not Attached) Date of Service: 06/25/2019 [facility], [place]                                                        End Of Treatment Note  Diagnoses: C32.0-Malignant neoplasm of glottis  Cancer Staging: Cancer Staging Squamous cell carcinoma of left vocal cord [facility]) Staging form: Larynx - Glottis, AJCC 8th Edition - Clinical stage from 04/11/2019: Stage III (cT3, cN0, cM0) - Signed by Lonie Peak, MD on 04/11/2019  Intent: Curative  Radiation Treatment Dates: 05/03/2019 through 06/25/2019  Site Technique Total Dose (Gy) Dose per Fx (Gy) Completed Fx Beam Energies  Larynx: HN_larynx IMRT 70/70 2 35/35 6X   Narrative: The patient tolerated radiation therapy relatively well.   Plan: The patient will follow-up with radiation oncology in 2 weeks.  -----------------------------------  Lonie Peak, MD

## 2019-07-18 ENCOUNTER — Encounter: Payer: Self-pay | Admitting: Radiation Oncology

## 2019-07-18 ENCOUNTER — Ambulatory Visit
Admission: RE | Admit: 2019-07-18 | Discharge: 2019-07-18 | Disposition: A | Discharge status: Home / Self Care | Payer: Medicare Other | Source: Ambulatory Visit | Attending: Radiation Oncology | Admitting: Radiation Oncology

## 2019-07-18 DIAGNOSIS — Z8521 Personal history of malignant neoplasm of larynx: Secondary | ICD-10-CM

## 2019-07-18 DIAGNOSIS — C320 Malignant neoplasm of glottis: Secondary | ICD-10-CM

## 2019-07-18 HISTORY — DX: Personal history of irradiation: Z92.3

## 2019-07-18 NOTE — Progress Notes (Signed)
Radiation Oncology         [phone] ________________________________  Name: Jean [name] MRN: [id number]  Date: 07/18/2019  DOB: [birth year]  Follow-Up Visit Note - patient opted for telemedicin during pandemic precautions -  this was conducted by telephone as she was unable to access MyChart video   CC: [name], MD  Newman Pies, MD  Diagnosis and Prior Radiotherapy:       ICD-10-CM   1. History of glottic cancer  Z85.21 NM PET Image Restag (PS) Skull Base To Thigh  2. Malignant neoplasm of glottis (HCC)  C32.0   3. 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 Radiation Treatment Dates: 05/03/2019 through 06/25/2019  Site Technique Total Dose (Gy) Dose per Fx (Gy) Completed Fx Beam Energies  Larynx: HN_larynx IMRT 70/70 2 35/35 6X    CHIEF COMPLAINT:  Here for follow-up and surveillance of throat cancer  Narrative:  The patient returns today for routine follow-up.  Her voice has come back, soreness has improved significantly.  She reports phlegm. Skin has healed well.    Maintaining weight.         Wt Readings from Last 3 Encounters:  07/04/19 116 lb 11.2 oz (52.9 kg)  06/20/19 116 lb 4.8 oz (52.8 kg)  06/14/19 118 lb 8 oz (53.8 kg)     ALLERGIES:  is allergic to sulfa antibiotics.  Meds: Current Outpatient Medications  Medication Sig Dispense Refill  . Adalimumab (HUMIRA PEN) 40 MG/0.4ML PNKT Inject 40 mg into the skin every 14 (fourteen) days. 3 each 0  . ALLERGIST TRAY 1CC 27GX1/2" 27G X 1/2" 1 ML KIT TO BE USED WITH WEEKLY METHOTREXATE INJECTIONS ONCE A WEEK (Patient not taking: Reported on 05/09/2019) 25 each 0  . amLODipine (NORVASC) 5 MG tablet Take 5 mg by mouth daily.  0  . Cholecalciferol (VITAMIN D) 50 MCG (2000 UT) CAPS Take 2,000 Units by mouth daily.     . fluconazole (DIFLUCAN) 100 MG tablet Take 1 tablet (100 mg total) by mouth daily. 14 tablet 0  . folic acid (FOLVITE) 1 MG tablet TAKE 2 TABLETS(2 MG) BY MOUTH DAILY 180  tablet 3  . lidocaine (XYLOCAINE) 2 % solution Patient: Mix 1part 2% viscous lidocaine, 1part H20. Swish & swallow 10mL of diluted mixture, before meals and at bedtime, up to QID 250 mL 0  . lidocaine-prilocaine (EMLA) cream Apply to affected area once 30 g 3  . LORazepam (ATIVAN) 0.5 MG tablet Take 1 tablet (0.5 mg total) by mouth every 6 (six) hours as needed (Nausea or vomiting). (Patient not taking: Reported on 05/16/2019) 30 tablet 0  . losartan (COZAAR) 100 MG tablet Take 100 mg by mouth daily.   0  . methotrexate 50 MG/2ML injection INJECT 0.6 ML(15 MG TOTAL) UNDER THE SKIN ONCE A WEEK (Patient not taking: Reported on 05/09/2019) 8 mL 0  . Nutritional Supplements (FEEDING SUPPLEMENT, OSMOLITE 1.5 CAL,) LIQD Give 1 bottle Osmolite 1.5 via PEG with 60 mL free water before and after 5 times daily.  Give 30 mL Prosource or equivalent mixed with 90 mL of water via PEG and flush with 60 mL of water.  Drink or flush feeding tube with 240 mL of free water 3 times daily between tube feedings. 1185 mL 3  . ondansetron (ZOFRAN) 8 MG tablet Take 1 tablet (8 mg total) by mouth 2 (two) times daily as needed. Start on the third day after chemotherapy. (Patient  not taking: Reported on 06/06/2019) 30 tablet 1  . potassium chloride SA (KLOR-CON) 20 MEQ tablet Take 1 tablet (20 mEq total) by mouth daily. (Patient not taking: Reported on 05/16/2019) 30 tablet 2  . prochlorperazine (COMPAZINE) 10 MG tablet Take 1 tablet (10 mg total) by mouth every 6 (six) hours as needed (Nausea or vomiting). (Patient not taking: Reported on 07/04/2019) 30 tablet 1  . sodium fluoride (PREVIDENT 5000 PLUS) 1.1 % CREA dental cream Apply to tooth brush. Brush teeth for 2 minutes. Spit out excess-DO NOT swallow. DO NOT rinse afterwards. Repeat nightly. 1 Tube prn  . vitamin B-12 (CYANOCOBALAMIN) 1000 MCG tablet Take 1,000 mcg by mouth daily.     No current facility-administered medications for this encounter.    Physical  Findings: The patient is in no acute distress. Patient is alert and oriented. Wt Readings from Last 3 Encounters:  07/04/19 116 lb 11.2 oz (52.9 kg)  06/20/19 116 lb 4.8 oz (52.8 kg)  06/14/19 118 lb 8 oz (53.8 kg)    vitals were not taken for this visit. .  General: Alert and oriented, in no acute distress; no significant hoarseness noted Psychiatric: Judgment and insight are intact. Affect is appropriate.   Lab Findings: Lab Results  Component Value Date   WBC 4.2 07/04/2019   HGB 8.4 (L) 07/04/2019   HCT 26.3 (L) 07/04/2019   MCV 104.4 (H) 07/04/2019   PLT 220 07/04/2019    Lab Results  Component Value Date   TSH 0.491 07/04/2019    Radiographic Findings: No results found.  Impression/Plan:    1) Head and Neck Cancer Status: Healing from radiotherapy and chemotherapy  2) Nutritional Status: Maintaining weight PEG tube: Present  3) Risk Factors: The patient has been educated about risk factors including alcohol and tobacco abuse; they understand that avoidance of alcohol and tobacco is important to prevent recurrences as well as other cancers  4) Swallowing: Functional, continue speech-language pathology exercises  5) Dental: Encouraged to continue regular followup with dentistry, and dental hygiene including fluoride rinses.   6) Thyroid function:  Lab Results  Component Value Date   TSH 0.491 07/04/2019    7) Other: Follow-up in mid April with restaging PET scan.  She is pleased with this plan and knows to call if she has issues earlier.  This encounter was provided by telemedicine platform by telephone as patient was unable to access MyChart video during pandemic precautions The patient has given verbal consent for this type of encounter and has been advised to only accept a meeting of this type in a secure network environment. The time spent during this encounter in total, on date of service, was 25 minutes. The attendants for this meeting include Lonie Peak  and Tanya Nones.  During the encounter, Lonie Peak was located at [facility] Radiation Oncology Department.  Tanya Nones was located at home.  _____________________________________   Lonie Peak, MD

## 2019-07-19 ENCOUNTER — Ambulatory Visit: Payer: Medicare Other | Admitting: Physical Therapy

## 2019-07-19 ENCOUNTER — Other Ambulatory Visit: Payer: Self-pay

## 2019-07-19 ENCOUNTER — Encounter: Payer: Self-pay | Admitting: Physical Therapy

## 2019-07-19 DIAGNOSIS — M6281 Muscle weakness (generalized): Secondary | ICD-10-CM | POA: Diagnosis not present

## 2019-07-19 DIAGNOSIS — M542 Cervicalgia: Secondary | ICD-10-CM | POA: Diagnosis not present

## 2019-07-19 DIAGNOSIS — L599 Disorder of the skin and subcutaneous tissue related to radiation, unspecified: Secondary | ICD-10-CM

## 2019-07-19 DIAGNOSIS — C320 Malignant neoplasm of glottis: Secondary | ICD-10-CM | POA: Diagnosis not present

## 2019-07-19 DIAGNOSIS — R293 Abnormal posture: Secondary | ICD-10-CM | POA: Diagnosis not present

## 2019-07-19 NOTE — Therapy (Signed)
her ago    Time  4    Period  Weeks    Status  New    Target Date  07/31/19      PT LONG TERM GOAL #3   Title  Pt will demonstrate neck ROM WFL in direction of lateral flexion and rotation bilaterally.    Baseline  Rotation to L and R- 25% limited, lateral flexion to L and R - 50% limited    Time  4    Period  Weeks    Status  New    Target Date  07/31/19      PT LONG TERM GOAL #4   Title  Pt will be independent in a home exercise program for continued strengthening and stretching.    Time  4    Period  Weeks    Status  New    Target Date  07/31/19            Plan - 07/19/19 1233    Clinical Impression Statement  Continued with manual therapy and PROM to neck today. Pt felt much improved after last session and feels she maintained the gains made at last session. Next session if her neck is continuing to feel less tight will begin strengthening and balance exercises to work towards those goals. Pt is feeling less tightness in her neck when she swallows as well as a result of the manual therapy  performed at last visit.    PT Frequency  1x / week    PT Duration  4 weeks    PT Treatment/Interventions  ADLs/Self Care Home Management;Therapeutic exercise;Patient/family education;Compression bandaging;Manual lymph drainage;Manual techniques;Passive range of motion;Taping    PT Next Visit Plan  continue PROM to neck, myofascial and STM to neck, MLD as needed for post surgical edema    PT Home Exercise Plan  head and neck stretches, walking program    Consulted and Agree with Plan of Care  Patient       Patient will benefit from skilled therapeutic intervention in order to improve the following deficits and impairments:  Postural dysfunction, Decreased knowledge of precautions, Increased edema, Decreased strength, Increased fascial restricitons, Pain, Decreased range of motion, Decreased endurance  Visit Diagnosis: Cervicalgia  Disorder of the skin and subcutaneous tissue related to radiation, unspecified     Problem List Patient Active Problem List   Diagnosis Date Noted  . Mucositis due to chemotherapy 05/16/2019  . Protein malnutrition ([place]) 05/16/2019  . Constipation due to opioid therapy 05/09/2019  . Port-A-Cath in place 05/02/2019  . Squamous cell carcinoma of left vocal cord ([place]) 04/06/2019  . Vitamin D deficiency 01/23/2017  . Rheumatoid arthritis, seronegative, multiple sites ([place]) 01/19/2017  . High risk medication use 01/19/2017  . Primary osteoarthritis of both hands 01/19/2017  . Primary osteoarthritis of both knees 01/19/2017  . Fatigue 01/19/2017  . Thrombocytosis ([place]) 01/19/2017  . Primary osteoarthritis of both feet 01/19/2017  . Anemia due to antineoplastic chemotherapy 01/19/2017  . Screening-pulmonary TB 04/26/2016    Allyson Sabal [facility] 07/19/2019, 12:35 PM  [place], [place], [address] Phone: [phone]   Fax:  [phone]  Name: RAYLEI RELIHAN MRN:  [id number] Date of Birth: Sep 22, 1945  Manus Gunning, PT 07/19/19 12:35 PM  [place], [place], [address] Phone: [phone]   Fax:  [phone]  Physical Therapy Treatment  Patient Details  Name: CHERILYNN LEGAN MRN: [id number] Date of Birth: 1946/05/28 Referring Provider (PT): Reita May Date: 07/19/2019  PT End of Session - 07/19/19 1233    Visit Number  4    Number of Visits  6    Date for PT Re-Evaluation  07/31/19    PT Start Time  1103    PT Stop Time  1147    PT Time Calculation (min)  44 min    Activity Tolerance  Patient tolerated treatment well    Behavior During Therapy  [facility] for tasks assessed/performed       Past Medical History:  Diagnosis Date  . Arthritis   . History of radiation therapy 05/03/19- 06/25/19   Larynx 35 fractions of 2 Gy each to total 70 Gy  . Hypertension   . Rheumatoid arthritis ([place])   . Vocal cord mass     Past Surgical History:  Procedure Laterality Date  . ECTOPIC PREGNANCY SURGERY    . EXTERNAL EAR SURGERY Right    removal of cyst or gland  . IR GASTROSTOMY TUBE MOD SED  04/26/2019  . IR IMAGING GUIDED PORT INSERTION  04/26/2019  . MICROLARYNGOSCOPY Left 03/26/2019   Procedure: DIRECT MICROLARYNGOSCOPY WITH BIOPSY OF VOCAL CORD MASS;  Surgeon: Leta Baptist, MD;  Location: [place];  Service: ENT;  Laterality: Left;    There were no vitals filed for this visit.  Subjective Assessment - 07/19/19 1105    Subjective  My muscles felt so much better after you worked on me. The lump in my throat seemed to improve immensely. I have not had to have anything for pain.    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  No/denies    Pain Score  0-No pain                       [facility]  Adult PT Treatment/Exercise - 07/19/19 0001      Manual Therapy   Manual Therapy  Myofascial release;Soft tissue mobilization;Passive ROM    Soft tissue mobilization  along bilateral scalenes, SCM, upper traps and levator    Myofascial Release  suboccipital release x 5 reps with 30 sec holds    Passive ROM  in supine in direction of bilateral rotation and bilateral lateral flexion with prolonged holds while performing STM                  PT Long Term Goals - 07/03/19 0944      PT LONG TERM GOAL #1   Title  Pt will demonstrate UE and LE strength grossly 4/5-5/5 to allow pt to return to PLOF after completing treatments.    Time  8    Period  Weeks    Status  On-going      PT LONG TERM GOAL #2   Title  Pt will be able to complete 12 sit to stands in 30 seconds without using hands to allow pt to return to prior level of function.    Baseline  7 reps which is below avg for

## 2019-07-20 ENCOUNTER — Encounter: Payer: Self-pay | Admitting: Radiation Oncology

## 2019-07-26 ENCOUNTER — Ambulatory Visit: Payer: Medicare Other | Attending: Radiation Oncology

## 2019-07-26 ENCOUNTER — Other Ambulatory Visit: Payer: Self-pay

## 2019-07-26 DIAGNOSIS — Z79899 Other long term (current) drug therapy: Secondary | ICD-10-CM

## 2019-07-26 DIAGNOSIS — C320 Malignant neoplasm of glottis: Secondary | ICD-10-CM | POA: Diagnosis not present

## 2019-07-26 DIAGNOSIS — M6281 Muscle weakness (generalized): Secondary | ICD-10-CM | POA: Diagnosis not present

## 2019-07-26 DIAGNOSIS — R293 Abnormal posture: Secondary | ICD-10-CM | POA: Insufficient documentation

## 2019-07-26 DIAGNOSIS — L599 Disorder of the skin and subcutaneous tissue related to radiation, unspecified: Secondary | ICD-10-CM | POA: Diagnosis not present

## 2019-07-26 DIAGNOSIS — M542 Cervicalgia: Secondary | ICD-10-CM

## 2019-07-26 NOTE — Therapy (Signed)
[place], [place], [address] Phone: [phone]   Fax:  [phone]  Physical Therapy Treatment  Patient Details  Name: Jean [name] MRN: [id number] Date of Birth: [birth year] Referring Provider (PT): Reita May Date: 07/26/2019  PT End of Session - 07/26/19 0900    Visit Number  5    Number of Visits  6    Date for PT Re-Evaluation  07/31/19    PT Start Time  0907    PT Stop Time  0951    PT Time Calculation (min)  44 min    Activity Tolerance  Patient tolerated treatment well    Behavior During Therapy  [facility] for tasks assessed/performed       Past Medical History:  Diagnosis Date  . Arthritis   . History of radiation therapy 05/03/19- 06/25/19   Larynx 35 fractions of 2 Gy each to total 70 Gy  . Hypertension   . Rheumatoid arthritis ([place])   . Vocal cord mass     Past Surgical History:  Procedure Laterality Date  . ECTOPIC PREGNANCY SURGERY    . EXTERNAL EAR SURGERY Right    removal of cyst or gland  . IR GASTROSTOMY TUBE MOD SED  04/26/2019  . IR IMAGING GUIDED PORT INSERTION  04/26/2019  . MICROLARYNGOSCOPY Left 03/26/2019   Procedure: DIRECT MICROLARYNGOSCOPY WITH BIOPSY OF VOCAL CORD MASS;  Surgeon: Leta Baptist, MD;  Location: [place];  Service: ENT;  Laterality: Left;    There were no vitals filed for this visit.  Subjective Assessment - 07/26/19 0901    Subjective  Pt reports that her swallowing is much better and feels like there is just a small itty bitty knot in her throat. She reports that she still takes her medicine slowly but she no longer has to take the numbing medicine to swallow her pills.    Pertinent History  stage 3 squamous cell carcinoma of left vocal cord, pt has completed chemo/radiation, Port and PEG placement 04/2019, rheumatoid arthritis    Patient Stated Goals  what to watch for, what to do now to help alleviate problems    Currently in  Pain?  Yes    Pain Score  4     Pain Location  Neck    Pain Orientation  Right;Anterior;Lateral    Pain Descriptors / Indicators  Aching    Pain Type  Acute pain    Pain Onset  More than a month ago    Pain Frequency  Intermittent    Aggravating Factors   talking    Pain Relieving Factors  rest, no longer taking medications for pain.    Effect of Pain on Daily Activities  difficult to talk for prolonged periods of time.                       [place] Adult PT Treatment/Exercise - 07/26/19 0001      Exercises   Exercises  Neck      Neck Exercises: Supine   Neck Retraction  10 reps;5 secs    Neck Retraction Limitations  VC for correct movement    Other Supine Exercise  Scapular retraction into table with hands flat10x 3 seconds VC for correct movement.     Other Supine Exercise  decompression position for the spine 3 minute hold, leg lengthening exercise Bil 5x 3 second hold, leg pess into table 5x 3 seconds VC for each  [place], [place], [address] Phone: [phone]   Fax:  [phone]  Physical Therapy Treatment  Patient Details  Name: Jean [name] MRN: [id number] Date of Birth: [birth year] Referring Provider (PT): Reita May Date: 07/26/2019  PT End of Session - 07/26/19 0900    Visit Number  5    Number of Visits  6    Date for PT Re-Evaluation  07/31/19    PT Start Time  0907    PT Stop Time  0951    PT Time Calculation (min)  44 min    Activity Tolerance  Patient tolerated treatment well    Behavior During Therapy  [facility] for tasks assessed/performed       Past Medical History:  Diagnosis Date  . Arthritis   . History of radiation therapy 05/03/19- 06/25/19   Larynx 35 fractions of 2 Gy each to total 70 Gy  . Hypertension   . Rheumatoid arthritis ([place])   . Vocal cord mass     Past Surgical History:  Procedure Laterality Date  . ECTOPIC PREGNANCY SURGERY    . EXTERNAL EAR SURGERY Right    removal of cyst or gland  . IR GASTROSTOMY TUBE MOD SED  04/26/2019  . IR IMAGING GUIDED PORT INSERTION  04/26/2019  . MICROLARYNGOSCOPY Left 03/26/2019   Procedure: DIRECT MICROLARYNGOSCOPY WITH BIOPSY OF VOCAL CORD MASS;  Surgeon: Leta Baptist, MD;  Location: [place] [facility];  Service: ENT;  Laterality: Left;    There were no vitals filed for this visit.  Subjective Assessment - 07/26/19 0901    Subjective  Pt reports that her swallowing is much better and feels like there is just a small itty bitty knot in her throat. She reports that she still takes her medicine slowly but she no longer has to take the numbing medicine to swallow her pills.    Pertinent History  stage 3 squamous cell carcinoma of left vocal cord, pt has completed chemo/radiation, Port and PEG placement 04/2019, rheumatoid arthritis    Patient Stated Goals  what to watch for, what to do now to help alleviate problems    Currently in  Pain?  Yes    Pain Score  4     Pain Location  Neck    Pain Orientation  Right;Anterior;Lateral    Pain Descriptors / Indicators  Aching    Pain Type  Acute pain    Pain Onset  More than a month ago    Pain Frequency  Intermittent    Aggravating Factors   talking    Pain Relieving Factors  rest, no longer taking medications for pain.    Effect of Pain on Daily Activities  difficult to talk for prolonged periods of time.                       [place] Adult PT Treatment/Exercise - 07/26/19 0001      Exercises   Exercises  Neck      Neck Exercises: Supine   Neck Retraction  10 reps;5 secs    Neck Retraction Limitations  VC for correct movement    Other Supine Exercise  Scapular retraction into table with hands flat10x 3 seconds VC for correct movement.     Other Supine Exercise  decompression position for the spine 3 minute hold, leg lengthening exercise Bil 5x 3 second hold, leg pess into table 5x 3 seconds VC for each  Visit Plan  Assess [place] decompression, progress strengthening for spinal alignment/core, continue PROM to neck, myofascial and STM to neck, MLD as needed for post surgical edema    PT Home Exercise Plan  head and neck stretches, walking program, [place] decompression    Consulted and Agree with Plan of Care  Patient       Patient will benefit from skilled therapeutic intervention in order to improve the following deficits and impairments:  Postural dysfunction, Decreased knowledge of precautions, Increased edema, Decreased strength, Increased fascial restricitons, Pain, Decreased range of motion, Decreased endurance  Visit Diagnosis: Cervicalgia  Disorder of the skin and subcutaneous tissue related to radiation, unspecified  Abnormal posture  Muscle weakness (generalized)  Malignant neoplasm of glottis [facility])     Problem List Patient Active Problem List   Diagnosis Date Noted  . Mucositis due to chemotherapy 05/16/2019  . Protein malnutrition ([place]) 05/16/2019  . Constipation due to opioid therapy 05/09/2019  . Port-A-Cath in place 05/02/2019  . Squamous cell carcinoma of left vocal cord ([place]) 04/06/2019  . Vitamin D deficiency 01/23/2017  . Rheumatoid arthritis, seronegative, multiple sites ([place]) 01/19/2017  . High risk medication use 01/19/2017  . Primary osteoarthritis of both hands 01/19/2017  . Primary osteoarthritis of both knees 01/19/2017  . Fatigue 01/19/2017  . Thrombocytosis ([place]) 01/19/2017  . Primary osteoarthritis of both feet 01/19/2017  . Anemia due to antineoplastic chemotherapy 01/19/2017  . Screening-pulmonary TB 04/26/2016    Ander Purpura , PT 07/26/2019, 9:54 AM  [place], [place], [address] Phone: [phone]   Fax:  [phone]  Name: Jean [name] MRN: [id number] Date of Birth: 12-Jan-1946

## 2019-07-26 NOTE — Patient Instructions (Signed)
1. Decompression Exercise     Cancer Rehab [phone]    Lie on back on firm surface, knees bent, feet flat, arms turned up, out to sides, backs of hands down. Time _5__ minutes. Surface: floor   2. Shoulder Press    Start in Decompression Exercise position. Press shoulders downward towards supporting surface. Hold __2-3__ seconds while counting out loud. Repeat _10___ times. Do _1-2___ times per day.   3. Head Press    Bring cervical spine (neck) into neutral position (by either tucking the chin towards the chest or tilting the chin upward). Feel weight on back of head. Press head downward into supporting surface.    Hold _5__ seconds. Repeat _10__ times. Do _1-2__ times per day.   4. Leg Lengthener    Straighten one leg. Pull toes AND forefoot toward knee, extend heel. Lengthen leg by pulling pelvis away from ribs. Hold _3__ seconds. Relax. Repeat __5__ times. Do other leg.  Surface: floor   5. Leg Press    Straighten one leg down to floor keeping leg aligned with hip. Pull toes AND forefoot toward knee; extend heel.  Press entire leg downward (as if pressing leg into [place]). DO NOT BEND KNEE. Hold _3__ seconds. Do __5__ times. Repeat with other leg.

## 2019-07-27 LAB — COMPLETE METABOLIC PANEL WITH GFR
AG Ratio: 1.8 (calc) (ref 1.0–2.5)
ALT: 14 U/L (ref 6–29)
AST: 15 U/L (ref 10–35)
Albumin: 4 g/dL (ref 3.6–5.1)
Alkaline phosphatase (APISO): 60 U/L (ref 37–153)
BUN: 13 mg/dL (ref 7–25)
CO2: 27 mmol/L (ref 20–32)
Calcium: 9.5 mg/dL (ref 8.6–10.4)
Chloride: 108 mmol/L (ref 98–110)
Creat: 0.74 mg/dL (ref 0.60–0.93)
GFR, Est African American: 94 mL/min/{1.73_m2} (ref >=60)
GFR, Est Non African American: 82 mL/min/{1.73_m2} (ref >=60)
Globulin: 2.2 g/dL (calc) (ref 1.9–3.7)
Glucose, Bld: 80 mg/dL (ref 65–99)
Potassium: 4.4 mmol/L (ref 3.5–5.3)
Sodium: 142 mmol/L (ref 135–146)
Total Bilirubin: 0.3 mg/dL (ref 0.2–1.2)
Total Protein: 6.2 g/dL (ref 6.1–8.1)

## 2019-07-27 LAB — CBC WITH DIFFERENTIAL/PLATELET
Absolute Monocytes: 362 cells/uL (ref 200–950)
Basophils Absolute: 11 cells/uL (ref 0–200)
Basophils Relative: 0.4 %
Eosinophils Absolute: 19 cells/uL (ref 15–500)
Eosinophils Relative: 0.7 %
HCT: 29.5 % — ABNORMAL LOW (ref 35.0–45.0)
Hemoglobin: 9.9 g/dL — ABNORMAL LOW (ref 11.7–15.5)
Lymphs Abs: 616 cells/uL — ABNORMAL LOW (ref 850–3900)
MCH: 34.5 pg — ABNORMAL HIGH (ref 27.0–33.0)
MCHC: 33.6 g/dL (ref 32.0–36.0)
MCV: 102.8 fL — ABNORMAL HIGH (ref 80.0–100.0)
MPV: 8.7 fL (ref 7.5–12.5)
Monocytes Relative: 13.4 %
Neutro Abs: 1693 cells/uL (ref 1500–7800)
Neutrophils Relative %: 62.7 %
Platelets: 277 10*3/uL (ref 140–400)
RBC: 2.87 10*6/uL — ABNORMAL LOW (ref 3.80–5.10)
RDW: 18.4 % — ABNORMAL HIGH (ref 11.0–15.0)
Total Lymphocyte: 22.8 %
WBC: 2.7 10*3/uL — ABNORMAL LOW (ref 3.8–10.8)

## 2019-07-27 NOTE — Progress Notes (Signed)
CMP is normal.  CBC shows neutropenia.  I called patient and discussed her lab values.  She is currently on chemotherapy for vocal cord cancer.  I advised her to hold off Humira.  She states she was taking Humira every 3 weeks.

## 2019-07-31 ENCOUNTER — Encounter: Payer: Self-pay | Admitting: Physical Therapy

## 2019-07-31 ENCOUNTER — Ambulatory Visit: Payer: Medicare Other | Admitting: Physical Therapy

## 2019-07-31 ENCOUNTER — Other Ambulatory Visit: Payer: Self-pay

## 2019-07-31 DIAGNOSIS — R293 Abnormal posture: Secondary | ICD-10-CM | POA: Diagnosis not present

## 2019-07-31 DIAGNOSIS — M6281 Muscle weakness (generalized): Secondary | ICD-10-CM | POA: Diagnosis not present

## 2019-07-31 DIAGNOSIS — L599 Disorder of the skin and subcutaneous tissue related to radiation, unspecified: Secondary | ICD-10-CM | POA: Diagnosis not present

## 2019-07-31 DIAGNOSIS — M542 Cervicalgia: Secondary | ICD-10-CM | POA: Diagnosis not present

## 2019-07-31 DIAGNOSIS — C320 Malignant neoplasm of glottis: Secondary | ICD-10-CM

## 2019-07-31 NOTE — Therapy (Signed)
01/19/2017  . Primary osteoarthritis of both knees 01/19/2017  . Fatigue 01/19/2017  . Thrombocytosis ([place]) 01/19/2017  . Primary osteoarthritis of both feet 01/19/2017  . Anemia due to antineoplastic chemotherapy 01/19/2017  . Screening-pulmonary TB 04/26/2016    Allyson Sabal [facility] 07/31/2019, 10:00 AM  [address] [place], [place], [address] Phone: [phone]   Fax:  [phone]  Name: Jean [name] MRN: [id number] Date of Birth: September 14, 1945  Manus Gunning, PT 07/31/19 10:00 AM  01/19/2017  . Primary osteoarthritis of both knees 01/19/2017  . Fatigue 01/19/2017  . Thrombocytosis ([place]) 01/19/2017  . Primary osteoarthritis of both feet 01/19/2017  . Anemia due to antineoplastic chemotherapy 01/19/2017  . Screening-pulmonary TB 04/26/2016    Allyson Sabal [facility] 07/31/2019, 10:00 AM  [address] [place], [place], [address] Phone: [phone]   Fax:  [phone]  Name: Jean [name] MRN: [id number] Date of Birth: September 14, 1945  Manus Gunning, PT 07/31/19 10:00 AM  [place], [place], [address] Phone: [phone]   Fax:  [phone]  Physical Therapy Treatment  Patient Details  Name: Jean [name] MRN: [id number] Date of Birth: 1946/04/28 Referring Provider (PT): Reita May Date: 07/31/2019  PT End of Session - 07/31/19 0957    Visit Number  6    Number of Visits  10    Date for PT Re-Evaluation  08/28/19    PT Start Time  0906    PT Stop Time  0955    PT Time Calculation (min)  49 min    Activity Tolerance  Patient tolerated treatment well    Behavior During Therapy  [facility] for tasks assessed/performed       Past Medical History:  Diagnosis Date  . Arthritis   . History of radiation therapy 05/03/19- 06/25/19   Larynx 35 fractions of 2 Gy each to total 70 Gy  . Hypertension   . Rheumatoid arthritis ([place])   . Vocal cord mass     Past Surgical History:  Procedure Laterality Date  . ECTOPIC PREGNANCY SURGERY    . EXTERNAL EAR SURGERY Right    removal of cyst or gland  . IR GASTROSTOMY TUBE MOD SED  04/26/2019  . IR IMAGING GUIDED PORT INSERTION  04/26/2019  . MICROLARYNGOSCOPY Left 03/26/2019   Procedure: DIRECT MICROLARYNGOSCOPY WITH BIOPSY OF VOCAL CORD MASS;  Surgeon: Leta Baptist, MD;  Location: [facility];  Service: ENT;  Laterality: Left;    There were no vitals filed for this visit.  Subjective Assessment - 07/31/19 0908    Subjective  I have not taken anything for pain since Jan 21. I am eating more.    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  No/denies    Pain Score  0-No pain                       [facility] Adult PT Treatment/Exercise - 07/31/19 0001      Exercises   Exercises   Shoulder      Shoulder Exercises: Supine   Horizontal ABduction  Strengthening;Both;10 reps;Theraband   pt returned therapist demo   Theraband Level (Shoulder Horizontal ABduction)  Level 2 (Red)    External Rotation  Strengthening;Both;10 reps;Theraband   pt returned therapist demo   Theraband Level (Shoulder External Rotation)  Level 2 (Red)    Flexion  Strengthening;Both;10 reps;Theraband   pt returned therapist demo, narrow and wide grip   Theraband Level (Shoulder Flexion)  Level 2 (Red)    Diagonals  Strengthening;Both;10 reps;Theraband   pt returned therapist demo   Theraband Level (Shoulder Diagonals)  Level 2 (Red)      Manual Therapy   Soft tissue mobilization  along bilateral scalenes, SCM, upper traps and levator    Myofascial Release  suboccipital release x 5 reps with 30 sec holds    Passive ROM  in supine in direction of bilateral rotation and bilateral lateral flexion with prolonged holds while performing STM                  PT Long Term Goals - 07/31/19 0927      PT LONG TERM GOAL #1

## 2019-07-31 NOTE — Patient Instructions (Signed)
Over Head Pull: Narrow and Wide Grip   Cancer Rehab [phone]   On back, knees bent, feet flat, band across thighs, elbows straight but relaxed. Pull hands apart (start). Keeping elbows straight, bring arms up and over head, hands toward floor. Keep pull steady on band. Hold momentarily. Return slowly, keeping pull steady, back to start. Then do same with a wider grip on the band (past shoulder width) Repeat _10__ times. Band color __red___   Side Pull: Double Arm   On back, knees bent, feet flat. Arms perpendicular to body, shoulder level, elbows straight but relaxed. Pull arms out to sides, elbows straight. Resistance band comes across collarbones, hands toward floor. Hold momentarily. Slowly return to starting position. Repeat _10__ times. Band color _red___   Sword   On back, knees bent, feet flat, left hand on left hip, right hand above left. Pull right arm DIAGONALLY (hip to shoulder) across chest. Bring right arm along head toward floor. Thumb is pointed down by opposite hip then rotates upwards when by head. Hold momentarily. Slowly return to starting position. Repeat _10__ times. Do with left arm. Band color _red____   Shoulder Rotation: Double Arm   On back, knees bent, feet flat, elbows tucked at sides, bent 90, hands palms up. Pull hands apart and down toward floor, keeping elbows near sides. Hold momentarily. Slowly return to starting position. Repeat __10__ times. Band color __red____

## 2019-07-31 NOTE — Progress Notes (Signed)
Office Visit Note  Patient: Jean [name]             Date of Birth: 06/18/1946           MRN: [id number]             PCP: [name], MD Referring: [name], MD Visit Date: 08/02/2019 Occupation: @GUAROCC @  Subjective:  Medication monitoring.   History of Present Illness: Jean [name] is a 74 y.o. female with history of rheumatoid arthritis and vocal cord cancer.  She was receiving chemotherapy .  Her last dose of Humira was early December and she has been off methotrexate since October.  She was receiving chemotherapy which she stopped on December 24.  Last radiation was January 4.  Her voice has improved a lot.  She states she still is missing the sense of taste.  She denies any joint pain or joint swelling currently.  Activities of Daily Living:  Patient reports morning stiffness for 0 minutes.   Patient Denies nocturnal pain.  Difficulty dressing/grooming: Denies Difficulty climbing stairs: Denies Difficulty getting out of chair: Denies Difficulty using hands for taps, buttons, cutlery, and/or writing: Denies  Review of Systems  Constitutional: Positive for fatigue.  HENT: Positive for mouth dryness. Negative for mouth sores and nose dryness.   Eyes: Negative for itching and dryness.  Respiratory: Negative for shortness of breath, wheezing and difficulty breathing.   Cardiovascular: Negative for chest pain and palpitations.  Gastrointestinal: Negative for blood in stool, constipation and diarrhea.  Endocrine: Negative for increased urination.  Genitourinary: Negative for difficulty urinating and painful urination.  Musculoskeletal: Negative for arthralgias, joint pain, joint swelling and morning stiffness.  Skin: Negative for rash.  Allergic/Immunologic: Negative for susceptible to infections.  Neurological: Negative for dizziness, headaches, memory loss and weakness.  Hematological: Negative for bruising/bleeding tendency.  Psychiatric/Behavioral:  Negative for confusion.    PMFS History:  Patient Active Problem List   Diagnosis Date Noted  . Protein malnutrition (HCC) 05/16/2019  . Constipation due to opioid therapy 05/09/2019  . Port-A-Cath in place 05/02/2019  . 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 Vitamin D deficiency 01/23/2017  . Rheumatoid arthritis, seronegative, multiple sites (HCC) 01/19/2017  . High risk medication use 01/19/2017  . Primary osteoarthritis of both hands 01/19/2017  . Primary osteoarthritis of both knees 01/19/2017  . Fatigue 01/19/2017  . Thrombocytosis (HCC) 01/19/2017  . Primary osteoarthritis of both feet 01/19/2017  . Anemia due to antineoplastic chemotherapy 01/19/2017  . Screening-pulmonary TB 04/26/2016    Past Medical History:  Diagnosis Date  . Arthritis   . History of radiation therapy 05/03/19- 06/25/19   Larynx 35 fractions of 2 Gy each to total 70 Gy  . Hypertension   . Rheumatoid arthritis (HCC)   . Vocal cord mass     Family History  Problem Relation Age of Onset  . Stroke Mother   . Hypertension Daughter    Past Surgical History:  Procedure Laterality Date  . ECTOPIC PREGNANCY SURGERY    . EXTERNAL EAR SURGERY Right    removal of cyst or gland  . IR GASTROSTOMY TUBE MOD SED  04/26/2019  . IR IMAGING GUIDED PORT INSERTION  04/26/2019  . MICROLARYNGOSCOPY Left 03/26/2019   Procedure: DIRECT MICROLARYNGOSCOPY WITH BIOPSY OF VOCAL CORD MASS;  Surgeon: Newman Pies, MD;  Location: [facility];  Service: ENT;  Laterality: Left;   Social History   Social History Narrative   Patient  was recently widowed in March 2020.   Patient has 1 daughter who lives in [place]   Patient moved to [place] from [place] in January 2009.   Patient is a retired [employer].   Immunization History  Administered Date(s) Administered  . Influenza, High Dose Seasonal PF 06/09/2017     Objective: Vital Signs: BP 133/77 (BP Location: Left Arm,  Patient Position: Sitting, Cuff Size: Normal)   Pulse 88   Resp 12   Ht 5\' 3"  (1.6 m)   Wt 117 lb (53.1 kg)   BMI 20.73 kg/m    Physical Exam Vitals and nursing note reviewed.  Constitutional:      Appearance: She is well-developed.  HENT:     Head: Normocephalic and atraumatic.  Eyes:     Conjunctiva/sclera: Conjunctivae normal.  Cardiovascular:     Rate and Rhythm: Normal rate and regular rhythm.     Heart sounds: Normal heart sounds.  Pulmonary:     Effort: Pulmonary effort is normal.     Breath sounds: Normal breath sounds.  Abdominal:     General: Bowel sounds are normal.     Palpations: Abdomen is soft.  Musculoskeletal:     Cervical back: Normal range of motion.  Lymphadenopathy:     Cervical: No cervical adenopathy.  Skin:    General: Skin is warm and dry.     Capillary Refill: Capillary refill takes less than 2 seconds.  Neurological:     Mental Status: She is alert and oriented to person, place, and time.  Psychiatric:        Behavior: Behavior normal.      Musculoskeletal Exam: C-spine thoracic and lumbar spine with good range of motion.  Shoulder joints, elbow joints, wrist joints, MCPs PIPs and DIPs with good range of motion.  She has some CMC thickening.  Hip joints, knee joints, ankles, MTPs and PIPs with good range of motion with no synovitis.  CDAI Exam: CDAI Score: 0  Patient Global: 0 mm; Provider Global: 0 mm Swollen: 0 ; Tender: 0  Joint Exam 08/02/2019   No joint exam has been documented for this visit   There is currently no information documented on the homunculus. Go to the Rheumatology activity and complete the homunculus joint exam.  Investigation: No additional findings.  Imaging: No results found.  Recent Labs: Lab Results  Component Value Date   WBC 4.1 08/01/2019   HGB 9.7 (L) 08/01/2019   PLT 275 08/01/2019   NA 141 08/01/2019   K 4.3 08/01/2019   CL 106 08/01/2019   CO2 25 08/01/2019   GLUCOSE 81 08/01/2019   BUN 16  08/01/2019   CREATININE 0.75 08/01/2019   BILITOT <0.2 (L) 08/01/2019   ALKPHOS 69 08/01/2019   AST 13 (L) 08/01/2019   ALT 12 08/01/2019   PROT 6.5 08/01/2019   ALBUMIN 3.9 08/01/2019   CALCIUM 9.4 08/01/2019   GFRAA >60 08/01/2019   QFTBGOLDPLUS NEGATIVE 01/15/2019    Speciality Comments: PPD- 07/08/17 Negative  Procedures:  No procedures performed Allergies: Sulfa antibiotics   Assessment / Plan:     Visit Diagnoses: Rheumatoid arthritis, seronegative, multiple sites (HCC)-patient had no synovitis on examination today.  She states she has been in remission since she had her chemotherapy.  She has been off Humira and methotrexate for a while.  We decided to hold off methotrexate and Humira for right now.  High risk medication use -  Methotrexate 0.6 ml sq qweek was discontinued in  October 2020, Folic acid 2mg  po qd and Humira 40 mg sq every 21 days.  Her last dose was in the first week of December 2020.   Primary osteoarthritis of both hands-she has some osteoarthritic changes.  Joint protection was discussed.  Primary osteoarthritis of both knees-she is currently not having much discomfort.  Primary osteoarthritis of both feet-doing well.  Other fatigue  History of vitamin D deficiency-she is on vitamin D supplement.  Postmenopausal-we will schedule DEXA scan.  Patient states she had 1 more than 15 years ago.  History of anemia  Thrombocytosis (HCC)-normalized in the last few months.  Orders: No orders of the defined types were placed in this encounter.  No orders of the defined types were placed in this encounter.    Follow-Up Instructions: No follow-ups on file.   Pollyann Savoy, MD  Note - This record has been created using Animal nutritionist.  Chart creation errors have been sought, but may not always  have been located. Such creation errors do not reflect on  the standard of medical care.

## 2019-08-01 ENCOUNTER — Ambulatory Visit (HOSPITAL_COMMUNITY): Payer: Self-pay | Admitting: Dentistry

## 2019-08-01 ENCOUNTER — Encounter: Payer: Self-pay | Admitting: Hematology

## 2019-08-01 ENCOUNTER — Inpatient Hospital Stay: Payer: Medicare Other

## 2019-08-01 ENCOUNTER — Encounter (HOSPITAL_COMMUNITY): Payer: Self-pay | Admitting: Dentistry

## 2019-08-01 ENCOUNTER — Inpatient Hospital Stay: Payer: Medicare Other | Attending: Hematology | Admitting: Hematology

## 2019-08-01 VITALS — BP 145/79 | HR 89 | Temp 98.7°F | Resp 18 | Ht 63.0 in | Wt 112.4 lb

## 2019-08-01 VITALS — BP 133/77 | HR 87 | Temp 98.6°F | Wt 117.0 lb

## 2019-08-01 DIAGNOSIS — C320 Malignant neoplasm of glottis: Secondary | ICD-10-CM

## 2019-08-01 DIAGNOSIS — K0850 Unsatisfactory restoration of tooth, unspecified: Secondary | ICD-10-CM

## 2019-08-01 DIAGNOSIS — D6481 Anemia due to antineoplastic chemotherapy: Secondary | ICD-10-CM | POA: Diagnosis not present

## 2019-08-01 DIAGNOSIS — T451X5A Adverse effect of antineoplastic and immunosuppressive drugs, initial encounter: Secondary | ICD-10-CM | POA: Diagnosis not present

## 2019-08-01 DIAGNOSIS — K031 Abrasion of teeth: Secondary | ICD-10-CM

## 2019-08-01 DIAGNOSIS — C329 Malignant neoplasm of larynx, unspecified: Secondary | ICD-10-CM | POA: Diagnosis not present

## 2019-08-01 DIAGNOSIS — K029 Dental caries, unspecified: Secondary | ICD-10-CM

## 2019-08-01 DIAGNOSIS — M069 Rheumatoid arthritis, unspecified: Secondary | ICD-10-CM | POA: Insufficient documentation

## 2019-08-01 DIAGNOSIS — K117 Disturbances of salivary secretion: Secondary | ICD-10-CM

## 2019-08-01 DIAGNOSIS — E46 Unspecified protein-calorie malnutrition: Secondary | ICD-10-CM | POA: Diagnosis not present

## 2019-08-01 DIAGNOSIS — K08409 Partial loss of teeth, unspecified cause, unspecified class: Secondary | ICD-10-CM

## 2019-08-01 DIAGNOSIS — R432 Parageusia: Secondary | ICD-10-CM

## 2019-08-01 DIAGNOSIS — Z79899 Other long term (current) drug therapy: Secondary | ICD-10-CM | POA: Diagnosis not present

## 2019-08-01 DIAGNOSIS — Z95828 Presence of other vascular implants and grafts: Secondary | ICD-10-CM

## 2019-08-01 DIAGNOSIS — K0889 Other specified disorders of teeth and supporting structures: Secondary | ICD-10-CM

## 2019-08-01 DIAGNOSIS — Z9221 Personal history of antineoplastic chemotherapy: Secondary | ICD-10-CM

## 2019-08-01 DIAGNOSIS — R1310 Dysphagia, unspecified: Secondary | ICD-10-CM

## 2019-08-01 LAB — CBC WITH DIFFERENTIAL (CANCER CENTER ONLY)
Abs Immature Granulocytes: 0.01 10*3/uL (ref 0.00–0.07)
Basophils Absolute: 0 10*3/uL (ref 0.0–0.1)
Basophils Relative: 0 %
Eosinophils Absolute: 0 10*3/uL (ref 0.0–0.5)
Eosinophils Relative: 1 %
HCT: 30.3 % — ABNORMAL LOW (ref 36.0–46.0)
Hemoglobin: 9.7 g/dL — ABNORMAL LOW (ref 12.0–15.0)
Immature Granulocytes: 0 %
Lymphocytes Relative: 15 %
Lymphs Abs: 0.6 10*3/uL — ABNORMAL LOW (ref 0.7–4.0)
MCH: 34.3 pg — ABNORMAL HIGH (ref 26.0–34.0)
MCHC: 32 g/dL (ref 30.0–36.0)
MCV: 107.1 fL — ABNORMAL HIGH (ref 80.0–100.0)
Monocytes Absolute: 0.6 10*3/uL (ref 0.1–1.0)
Monocytes Relative: 14 %
Neutro Abs: 2.9 10*3/uL (ref 1.7–7.7)
Neutrophils Relative %: 70 %
Platelet Count: 275 10*3/uL (ref 150–400)
RBC: 2.83 MIL/uL — ABNORMAL LOW (ref 3.87–5.11)
RDW: 18.1 % — ABNORMAL HIGH (ref 11.5–15.5)
WBC Count: 4.1 10*3/uL (ref 4.0–10.5)
nRBC: 0 % (ref 0.0–0.2)

## 2019-08-01 LAB — CMP (CANCER CENTER ONLY)
ALT: 12 U/L (ref 0–44)
AST: 13 U/L — ABNORMAL LOW (ref 15–41)
Albumin: 3.9 g/dL (ref 3.5–5.0)
Alkaline Phosphatase: 69 U/L (ref 38–126)
Anion gap: 10 (ref 5–15)
BUN: 16 mg/dL (ref 8–23)
CO2: 25 mmol/L (ref 22–32)
Calcium: 9.4 mg/dL (ref 8.9–10.3)
Chloride: 106 mmol/L (ref 98–111)
Creatinine: 0.75 mg/dL (ref 0.44–1.00)
GFR, Est AFR Am: >60 mL/min (ref >60)
GFR, Estimated: >60 mL/min (ref >60)
Glucose, Bld: 81 mg/dL (ref 70–99)
Potassium: 4.3 mmol/L (ref 3.5–5.1)
Sodium: 141 mmol/L (ref 135–145)
Total Bilirubin: <0.2 mg/dL — ABNORMAL LOW (ref 0.3–1.2)
Total Protein: 6.5 g/dL (ref 6.5–8.1)

## 2019-08-01 MED ORDER — SODIUM CHLORIDE 0.9% FLUSH
10.0000 mL | INTRAVENOUS | Status: DC | PRN
  Administered 2019-08-01: 15:00:00 10 mL
  Filled 2019-08-01: qty 10

## 2019-08-01 MED ORDER — HEPARIN SOD (PORK) LOCK FLUSH 100 UNIT/ML IV SOLN
500.0000 [IU] | Freq: Once | INTRAVENOUS | Status: AC | PRN
  Administered 2019-08-01: 15:00:00 500 [IU]
  Filled 2019-08-01: qty 5

## 2019-08-01 NOTE — Progress Notes (Signed)
[place] OFFICE PROGRESS NOTE  Patient Care Team: Charolette Forward, MD as PCP - General (Cardiology) Wyatt Portela, MD as Consulting Physician (Oncology) Charolette Forward, MD as Referring Physician (Cardiology) Eppie Gibson, MD as Attending Physician (Radiation Oncology) Leota Sauers, RN as Oncology Nurse Navigator  HEME/ONC OVERVIEW: 1. 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 FDG-avid left glottic malignancy on PET without definite metastatic disease  -04/2019 - 06/2019: definitive chemoRT with weekly cisplatin   2. Port and PEG in 04/2019   TREATMENT SUMMARY:  05/02/2019 - 06/25/2019: definitive chemoRT with weekly cisplatin   On surveillance   PERTINENT NON-HEM/ONC PROBLEMS: 1. 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 -S/p 7 cycles of weekly cisplatin concurrent with RT in the definitive setting -Clinically, patient still has some hoarseness of voice, possibly due to vocal cord paralysis; no lymphadenopathy on exam  -Labs reviewed and see the management below  -We will plan to obtain end-of-treatment PET in 3-4 months (mid-09/2019) to assess disease response  -I have also reached out to H&N navigator to assist the patient with ENT follow-up   Chemotherapy-associated anemia -Secondary to chemotherapy, as well as chronic bone marrow suppression due to hx of methotrexate for rheumatoid arthritis  -Hgb 9.7 today, improving since the last visit  -Patient denies any symptom of bleeding -On folic acid 16m daily  -We will monitor it closely  Protein malnutrition -Secondary to chemoradiation -Weight stable since the last visit  -She is currently able to  eat and drink without significant limitation, but still uses feeding tube periodically  -I encouraged the patient to ncrease oral intake as tolerated -Once she is able to sustain nutrition via po intake, we can remove the feeding tube  Port-a-cath in place -Q6-8week flush for now until PET results   Orders Placed This Encounter  Procedures  . CBC with Differential (Cancer Center Only)    Standing Status:   Future    Standing Expiration Date:   09/04/2020  . CMP ([facility])    Standing Status:   Future    Standing Expiration Date:   09/04/2020  . TSH    Standing Status:   Future    Standing Expiration Date:   07/31/2020    The total time spent in the encounter was 35 minutes, including face-to-face time with the patient, review of various tests results, order additional studies/medications, documentation, and coordination of care plan.   All questions were answered. The patient knows to call the clinic with any problems, questions or concerns. No barriers to learning was detected.  Return in mid-09/2019 for port flush, labs, and clinic follow-up.   YTish Men MD 2/10/20213:56 PM  CHIEF COMPLAINT: "I am feeling better"  INTERVAL HISTORY: Ms. SShounreturns clinic for follow-up of squamous cell carcinoma of the left vocal cord s/p definitive chemoradiation.  Patient reports that her appetite is improving, and she has been able to resume oral intake without significant limitation.  She still has some mild discomfort with swallowing, but she denies any dysphagia or odynophagia.  Her weight has been stable.  She still periodically administers tube feeding.  She takes MiraLAX and stool softeners daily, and has some loose stools, but  she is adjusting her bowel regimen as needed.  She has not yet met with Dr. Benjamine Mola since completing chemoradiation.  She denies any other complaint today.  REVIEW OF SYSTEMS:   Constitutional: ( - ) fevers, ( - )  chills , ( - ) night sweats Eyes: ( - )  blurriness of vision, ( - ) double vision, ( - ) watery eyes Ears, nose, mouth, throat, and face: ( - ) mucositis, ( - ) sore throat Respiratory: ( - ) cough, ( - ) dyspnea, ( - ) wheezes Cardiovascular: ( - ) palpitation, ( - ) chest discomfort, ( - ) lower extremity swelling Gastrointestinal:  ( - ) nausea, ( - ) heartburn, ( - ) change in bowel habits Skin: ( - ) abnormal skin rashes Lymphatics: ( - ) new lymphadenopathy, ( - ) easy bruising Neurological: ( - ) numbness, ( - ) tingling, ( - ) new weaknesses Behavioral/Psych: ( - ) mood change, ( - ) new changes  All other systems were reviewed with the patient and are negative.  SUMMARY OF ONCOLOGIC HISTORY: Oncology History  Squamous cell carcinoma of left vocal cord ([place])  03/26/2019 Pathology Results   DIAGNOSIS:   A. VOCAL CORD MASS, LEFT, EXCISION:  - Squamous cell carcinoma.  - See comment.    04/06/2019 Initial Diagnosis   Squamous cell carcinoma of left vocal cord ([facility])   04/10/2019 Imaging   PET: IMPRESSION: 1. Left glottic lesion is hypermetabolic with maximum SUV 8.9, compatible with malignancy. No discrete adenopathy in the neck. 2. Extensive periarticular activity especially around the large joints, likely related to rheumatoid arthritis. 3. Small but hypermetabolic axillary and left external iliac lymph nodes are most likely reactive to the patient's rheumatoid arthritis, but merit surveillance. 4. Mild right hydronephrosis without hydroureter, query right UPJ narrowing. Consider follow renal sonography. 5. Prominent but not hypermetabolic left adnexa, possibly due to prominent ovary or left eccentricity of the uterus, correlate with surgical history. This could be further investigated with pelvic sonography if clinically warranted. 6. Other imaging findings of potential clinical significance: Chronic left maxillary sinusitis. Aortic Atherosclerosis (ICD10-I70.0). Coronary atherosclerosis. Emphysema  (ICD10-J43.9). Sigmoid colon diverticulosis.   04/10/2019 Imaging   CT neck w/ contrast: IMPRESSION: 1. Left glottic mass extending to but not beyond the anterior commissure. There is extension into the left paraglottic fat without cartilage erosion. 2. No adenopathy. 3. Aortic Atherosclerosis (ICD10-I70.0) and Emphysema (ICD10-J43.9).   04/11/2019 Cancer Staging   Staging form: Larynx - Glottis, AJCC 8th Edition - Clinical stage from 04/11/2019: Stage III (cT3, cN0, cM0) - Signed by Eppie Gibson, MD on 04/11/2019   05/03/2019 -  Chemotherapy   The patient had palonosetron (ALOXI) injection 0.25 mg, 0.25 mg, Intravenous,  Once, 7 of 7 cycles Administration: 0.25 mg (05/03/2019), 0.25 mg (05/10/2019), 0.25 mg (05/18/2019), 0.25 mg (05/24/2019), 0.25 mg (05/31/2019), 0.25 mg (06/14/2019), 0.25 mg (06/23/2019) CISplatin (PLATINOL) 62 mg in sodium chloride 0.9 % 250 mL chemo infusion, 40 mg/m2 = 62 mg, Intravenous,  Once, 7 of 7 cycles Dose modification: 30 mg/m2 (original dose 40 mg/m2, Cycle 5, Reason: Dose not tolerated) Administration: 62 mg (05/03/2019), 62 mg (05/10/2019), 62 mg (05/18/2019), 62 mg (05/24/2019), 46 mg (05/31/2019), 46 mg (06/14/2019), 46 mg (06/23/2019) fosaprepitant (EMEND) 150 mg, dexamethasone (DECADRON) 12 mg in sodium chloride 0.9 % 145 mL IVPB, , Intravenous,  Once, 7 of 7 cycles Administration:  (05/03/2019),  (05/10/2019),  (05/18/2019),  (05/24/2019),  (05/31/2019),  (06/14/2019),  (06/23/2019)  for chemotherapy  treatment.      I have reviewed the past medical history, past surgical history, social history and family history with the patient and they are unchanged from previous note.  ALLERGIES:  is allergic to sulfa antibiotics.  MEDICATIONS:  Current Outpatient Medications  Medication Sig Dispense Refill  . amLODipine (NORVASC) 5 MG tablet Take 5 mg by mouth daily.  0  . Cholecalciferol (VITAMIN D) 50 MCG (2000 UT) CAPS Take 2,000 Units by mouth daily.     .  fluconazole (DIFLUCAN) 100 MG tablet Take 1 tablet (100 mg total) by mouth daily. 14 tablet 0  . folic acid (FOLVITE) 1 MG tablet TAKE 2 TABLETS(2 MG) BY MOUTH DAILY 180 tablet 3  . lidocaine (XYLOCAINE) 2 % solution Patient: Mix 1part 2% viscous lidocaine, 1part H20. Swish & swallow 33m of diluted mixture, 332m before meals and at bedtime, up to QID 250 mL 0  . lidocaine-prilocaine (EMLA) cream Apply to affected area once 30 g 3  . losartan (COZAAR) 100 MG tablet Take 100 mg by mouth daily.   0  . Nutritional Supplements (FEEDING SUPPLEMENT, OSMOLITE 1.5 CAL,) LIQD Give 1 bottle Osmolite 1.5 via PEG with 60 mL free water before and after 5 times daily.  Give 30 mL Prosource or equivalent mixed with 90 mL of water via PEG and flush with 60 mL of water.  Drink or flush feeding tube with 240 mL of free water 3 times daily between tube feedings. 1185 mL 3  . sodium fluoride (PREVIDENT 5000 PLUS) 1.1 % CREA dental cream Apply to tooth brush. Brush teeth for 2 minutes. Spit out excess-DO NOT swallow. DO NOT rinse afterwards. Repeat nightly. 1 Tube prn  . vitamin B-12 (CYANOCOBALAMIN) 1000 MCG tablet Take 1,000 mcg by mouth daily.    . Adalimumab (HUMIRA PEN) 40 MG/0.4ML PNKT Inject 40 mg into the skin every 14 (fourteen) days. (Patient not taking: Reported on 08/01/2019) 3 each 0  . ALLERGIST TRAY 1CC 27GX1/2" 27G X 1/2" 1 ML KIT TO BE USED WITH WEEKLY METHOTREXATE INJECTIONS ONCE A WEEK (Patient not taking: Reported on 05/09/2019) 25 each 0  . LORazepam (ATIVAN) 0.5 MG tablet Take 1 tablet (0.5 mg total) by mouth every 6 (six) hours as needed (Nausea or vomiting). (Patient not taking: Reported on 05/16/2019) 30 tablet 0  . methotrexate 50 MG/2ML injection INJECT 0.6 ML(15 MG TOTAL) UNDER THE SKIN ONCE A WEEK (Patient not taking: Reported on 05/09/2019) 8 mL 0  . ondansetron (ZOFRAN) 8 MG tablet Take 1 tablet (8 mg total) by mouth 2 (two) times daily as needed. Start on the third day after chemotherapy.  (Patient not taking: Reported on 06/06/2019) 30 tablet 1  . potassium chloride SA (KLOR-CON) 20 MEQ tablet Take 1 tablet (20 mEq total) by mouth daily. (Patient not taking: Reported on 05/16/2019) 30 tablet 2  . prochlorperazine (COMPAZINE) 10 MG tablet Take 1 tablet (10 mg total) by mouth every 6 (six) hours as needed (Nausea or vomiting). (Patient not taking: Reported on 07/04/2019) 30 tablet 1   No current facility-administered medications for this visit.    PHYSICAL EXAMINATION: ECOG PERFORMANCE STATUS: 1 - Symptomatic but completely ambulatory  Today's Vitals   08/01/19 1534 08/01/19 1539  BP: (!) 145/79   Pulse: 89   Resp: 18   Temp: 98.7 F (37.1 C)   TempSrc: Temporal   SpO2: 100%   Weight: 112 lb 6.4 oz (51 kg)   Height: 5' 3"  (1.6 m)   PainSc:  0-No pain   Body mass index is 19.91 kg/m.  Filed Weights   08/01/19 1534  Weight: 112 lb 6.4 oz (51 kg)    GENERAL: alert, no distress and comfortable, thin  SKIN: skin color, texture, turgor are normal, no rashes or significant lesions EYES: conjunctiva are pink and non-injected, sclera clear OROPHARYNX: no exudate, no erythema; lips, buccal mucosa, and tongue normal  NECK: supple, non-tender LYMPH:  no palpable lymphadenopathy in the cervical LUNGS: clear to auscultation with normal breathing effort HEART: regular rate & rhythm and no murmurs and no lower extremity edema ABDOMEN: soft, non-tender, non-distended, normal bowel sounds Musculoskeletal: no cyanosis of digits and no clubbing  PSYCH: alert & oriented x 3, speech improving   LABORATORY DATA:  I have reviewed the data as listed    Component Value Date/Time   NA 142 07/26/2019 1009   NA 141 03/05/2014 1050   K 4.4 07/26/2019 1009   K 3.9 03/05/2014 1050   CL 108 07/26/2019 1009   CO2 27 07/26/2019 1009   CO2 24 03/05/2014 1050   GLUCOSE 80 07/26/2019 1009   GLUCOSE 99 03/05/2014 1050   BUN 13 07/26/2019 1009   BUN 10.6 03/05/2014 1050   CREATININE  0.74 07/26/2019 1009   CREATININE 0.8 03/05/2014 1050   CALCIUM 9.5 07/26/2019 1009   CALCIUM 8.8 03/05/2014 1050   PROT 6.2 07/26/2019 1009   PROT 6.5 03/05/2014 1050   ALBUMIN 3.2 (L) 07/04/2019 1542   ALBUMIN 3.2 (L) 03/05/2014 1050   AST 15 07/26/2019 1009   AST 13 (L) 07/04/2019 1542   AST 12 03/05/2014 1050   ALT 14 07/26/2019 1009   ALT 15 07/04/2019 1542   ALT 28 03/05/2014 1050   ALKPHOS 80 07/04/2019 1542   ALKPHOS 85 03/05/2014 1050   BILITOT 0.3 07/26/2019 1009   BILITOT <0.2 (L) 07/04/2019 1542   BILITOT 0.20 03/05/2014 1050   GFRNONAA 82 07/26/2019 1009   GFRAA 94 07/26/2019 1009    No results found for: SPEP, UPEP  Lab Results  Component Value Date   WBC 4.1 08/01/2019   NEUTROABS 2.9 08/01/2019   HGB 9.7 (L) 08/01/2019   HCT 30.3 (L) 08/01/2019   MCV 107.1 (H) 08/01/2019   PLT 275 08/01/2019      Chemistry      Component Value Date/Time   NA 142 07/26/2019 1009   NA 141 03/05/2014 1050   K 4.4 07/26/2019 1009   K 3.9 03/05/2014 1050   CL 108 07/26/2019 1009   CO2 27 07/26/2019 1009   CO2 24 03/05/2014 1050   BUN 13 07/26/2019 1009   BUN 10.6 03/05/2014 1050   CREATININE 0.74 07/26/2019 1009   CREATININE 0.8 03/05/2014 1050      Component Value Date/Time   CALCIUM 9.5 07/26/2019 1009   CALCIUM 8.8 03/05/2014 1050   ALKPHOS 80 07/04/2019 1542   ALKPHOS 85 03/05/2014 1050   AST 15 07/26/2019 1009   AST 13 (L) 07/04/2019 1542   AST 12 03/05/2014 1050   ALT 14 07/26/2019 1009   ALT 15 07/04/2019 1542   ALT 28 03/05/2014 1050   BILITOT 0.3 07/26/2019 1009   BILITOT <0.2 (L) 07/04/2019 1542   BILITOT 0.20 03/05/2014 1050       RADIOGRAPHIC STUDIES: I have personally reviewed the radiological images as listed below and agreed with the findings in the report. No results found.

## 2019-08-01 NOTE — Patient Instructions (Signed)
COVID-19 Education: The signs and symptoms of COVID-19 were discussed with the patient and how to seek care for testing (follow up with PCP or arrange E-visit).   The importance of social distancing was discussed today.  COVID-19 Vaccine Information can be found at: ShippingScam.co.uk For questions related to vaccine distribution or appointments, please email vaccine@[email or web address] .[email or web address] or call [phone].   RECOMMENDATIONS: 1. Brush after meals and at bedtime.  Use fluoride at bedtime. 2. Use trismus exercises as directed. 3. Use Biotene Rinse or baking soda rinses. 4. Multiple sips of water as needed. 5. Return to [place] for evaluation for restoration of tooth #4 as well as continued dental care as scheduled.   Lenn Cal, DDS

## 2019-08-01 NOTE — Progress Notes (Signed)
08/01/2019  Patient Name:   Jean [name] Date of Birth:   07-16-1945 Medical Record Number: [id number]  COVID 19 SCREENING: The patient does not symptoms concerning for COVID-19 infection (Including fever, chills, cough, or new SHORTNESS OF BREATH).    BP 133/77 (BP Location: Right Arm)   Pulse 87   Temp 98.6 F (37 C)   Wt 117 lb (53.1 kg)   BMI 20.73 kg/m   [name] presents for oral examination after chemoradiation therapy. Patient has completed all radiation treatments from 05/03/19 thru 06/25/19 Patient completed 7 weeks chemo therapy treatments.  REVIEW OF CHIEF COMPLAINTS:  DRY MOUTH: Yes HARD TO SWALLOW: Yes  HURT TO SWALLOW: No TASTE CHANGES: Slowly returning [place]: No TRISMUS: None WEIGHT: 117 lbs down from initial 120 lbs.  HOME OH REGIMEN:  BRUSHING: Twice a day FLOSSING: once a day RINSING: Baking soda rinses FLUORIDE: Uses on TB at bedtime. TRISMUS EXERCISES:  Maximum interincisal opening: 45 mm    DENTAL EXAM:  Oral Hygiene:(PLAQUE): good oral hygiene LOCATION OF MUCOSITIS: None DESCRIPTION OF SALIVA: Decreased. Mild xerostomia ANY EXPOSED BONE: None  OTHER WATCHED AREAS: Dental caries, impacted teeth, suboptimal dental restorations, and need for periodontal maintenance procedures. DX: Xerostomia, Dysgeusia, Dysphagia and Dental caries, suboptimal dental restorations, multiple missing teeth, need for periodontal maintenance procedures.  RECOMMENDATIONS: 1. Brush after meals and at bedtime.  Use fluoride at bedtime. 2. Use trismus exercises as directed. 3. Use Biotene Rinse or baking soda rinses. 4. Multiple sips of water as needed. 5. Return to [place] for evaluation for restoration of tooth #4 as well as continued dental care as scheduled.   Lenn Cal, DDS

## 2019-08-02 ENCOUNTER — Encounter: Payer: Self-pay | Admitting: Rheumatology

## 2019-08-02 ENCOUNTER — Telehealth: Payer: Self-pay | Admitting: Hematology

## 2019-08-02 ENCOUNTER — Ambulatory Visit: Payer: Medicare Other | Admitting: Rheumatology

## 2019-08-02 ENCOUNTER — Other Ambulatory Visit: Payer: Self-pay

## 2019-08-02 VITALS — BP 133/77 | HR 88 | Resp 12 | Ht 63.0 in | Wt 117.0 lb

## 2019-08-02 DIAGNOSIS — M170 Bilateral primary osteoarthritis of knee: Secondary | ICD-10-CM

## 2019-08-02 DIAGNOSIS — M19072 Primary osteoarthritis, left ankle and foot: Secondary | ICD-10-CM

## 2019-08-02 DIAGNOSIS — M19041 Primary osteoarthritis, right hand: Secondary | ICD-10-CM | POA: Diagnosis not present

## 2019-08-02 DIAGNOSIS — Z862 Personal history of diseases of the blood and blood-forming organs and certain disorders involving the immune mechanism: Secondary | ICD-10-CM

## 2019-08-02 DIAGNOSIS — Z780 Asymptomatic menopausal state: Secondary | ICD-10-CM

## 2019-08-02 DIAGNOSIS — M19042 Primary osteoarthritis, left hand: Secondary | ICD-10-CM

## 2019-08-02 DIAGNOSIS — Z79899 Other long term (current) drug therapy: Secondary | ICD-10-CM

## 2019-08-02 DIAGNOSIS — D473 Essential (hemorrhagic) thrombocythemia: Secondary | ICD-10-CM

## 2019-08-02 DIAGNOSIS — M19071 Primary osteoarthritis, right ankle and foot: Secondary | ICD-10-CM | POA: Diagnosis not present

## 2019-08-02 DIAGNOSIS — Z8639 Personal history of other endocrine, nutritional and metabolic disease: Secondary | ICD-10-CM

## 2019-08-02 DIAGNOSIS — M0609 Rheumatoid arthritis without rheumatoid factor, multiple sites: Secondary | ICD-10-CM

## 2019-08-02 DIAGNOSIS — D75839 Thrombocytosis, unspecified: Secondary | ICD-10-CM

## 2019-08-02 DIAGNOSIS — R5383 Other fatigue: Secondary | ICD-10-CM

## 2019-08-02 LAB — TSH: TSH: 1.12 u[IU]/mL (ref 0.308–3.960)

## 2019-08-02 NOTE — Telephone Encounter (Signed)
I talk with patient regarding schedule  

## 2019-08-07 ENCOUNTER — Ambulatory Visit: Payer: Medicare Other

## 2019-08-08 ENCOUNTER — Other Ambulatory Visit: Payer: Self-pay

## 2019-08-08 ENCOUNTER — Ambulatory Visit: Payer: Medicare Other | Admitting: Physical Therapy

## 2019-08-08 DIAGNOSIS — R293 Abnormal posture: Secondary | ICD-10-CM

## 2019-08-08 DIAGNOSIS — L599 Disorder of the skin and subcutaneous tissue related to radiation, unspecified: Secondary | ICD-10-CM

## 2019-08-08 DIAGNOSIS — M542 Cervicalgia: Secondary | ICD-10-CM | POA: Diagnosis not present

## 2019-08-08 DIAGNOSIS — M6281 Muscle weakness (generalized): Secondary | ICD-10-CM | POA: Diagnosis not present

## 2019-08-08 DIAGNOSIS — C320 Malignant neoplasm of glottis: Secondary | ICD-10-CM | POA: Diagnosis not present

## 2019-08-08 NOTE — Therapy (Signed)
to opioid therapy 05/09/2019  . Port-A-Cath in place 05/02/2019  . Squamous cell carcinoma of left vocal cord ([place]) 04/06/2019  . Vitamin D deficiency 01/23/2017  . Rheumatoid arthritis, seronegative, multiple sites ([place]) 01/19/2017  . High risk medication use 01/19/2017  . Primary osteoarthritis of both hands 01/19/2017  . Primary osteoarthritis of both knees 01/19/2017  . Fatigue 01/19/2017  . Thrombocytosis ([place]) 01/19/2017  . Primary osteoarthritis of both feet 01/19/2017  . Anemia due to antineoplastic chemotherapy 01/19/2017  . Screening-pulmonary TB 04/26/2016   Donato Heinz. Owens Shark PT  [place] Levo 08/08/2019, 7:28 PM  [place], [place], [address] Phone: [phone]   Fax:  [phone]  Name: Jean [name] MRN: [id number] Date of Birth: 1945/08/21  to opioid therapy 05/09/2019  . Port-A-Cath in place 05/02/2019  . Squamous cell carcinoma of left vocal cord ([place]) 04/06/2019  . Vitamin D deficiency 01/23/2017  . Rheumatoid arthritis, seronegative, multiple sites ([place]) 01/19/2017  . High risk medication use 01/19/2017  . Primary osteoarthritis of both hands 01/19/2017  . Primary osteoarthritis of both knees 01/19/2017  . Fatigue 01/19/2017  . Thrombocytosis ([place]) 01/19/2017  . Primary osteoarthritis of both feet 01/19/2017  . Anemia due to antineoplastic chemotherapy 01/19/2017  . Screening-pulmonary TB 04/26/2016   Donato Heinz. Owens Shark PT  [place]o 08/08/2019, 7:28 PM  [place], [place], [address] Phone: [phone]   Fax:  [phone]  Name: Jean [name] MRN: [id number] Date of Birth: 1945/08/21  [place], [place], [address] Phone: [phone]   Fax:  [phone]  Physical Therapy Treatment  Patient Details  Name: Jean [name] MRN: [id number] Date of Birth: January 05, 1946 Referring Provider (PT): Reita May Date: 08/08/2019  PT End of Session - 08/08/19 1926    Visit Number  8    Number of Visits  10    Date for PT Re-Evaluation  08/28/19    PT Start Time  1300    PT Stop Time  1345    PT Time Calculation (min)  45 min    Activity Tolerance  Patient tolerated treatment well    Behavior During Therapy  [facility] for tasks assessed/performed       Past Medical History:  Diagnosis Date  . Arthritis   . History of radiation therapy 05/03/19- 06/25/19   Larynx 35 fractions of 2 Gy each to total 70 Gy  . Hypertension   . Rheumatoid arthritis ([place])   . Vocal cord mass     Past Surgical History:  Procedure Laterality Date  . ECTOPIC PREGNANCY SURGERY    . EXTERNAL EAR SURGERY Right    removal of cyst or gland  . IR GASTROSTOMY TUBE MOD SED  04/26/2019  . IR IMAGING GUIDED PORT INSERTION  04/26/2019  . MICROLARYNGOSCOPY Left 03/26/2019   Procedure: DIRECT MICROLARYNGOSCOPY WITH BIOPSY OF VOCAL CORD MASS;  Surgeon: Leta Baptist, MD;  Location: [facility];  Service: ENT;  Laterality: Left;    There were no vitals filed for this visit.  Subjective Assessment - 08/08/19 1308    Subjective  Pt states she is still having stiffness in her right neck even though the cancer is was on the left side    Pertinent History  stage 3 squamous cell carcinoma of left vocal cord, pt has completed chemo/radiation, Port and PEG placement 04/2019, rheumatoid arthritis    Patient Stated Goals  what to watch for, what to do now to help alleviate problems    Currently in Pain?  No/denies                       [facility] Adult PT Treatment/Exercise - 08/08/19 0001      Exercises   Exercises  Neck;Shoulder;Knee/Hip      Neck Exercises: Seated   Other Seated Exercise  neck and upper thoracic range of motion       Knee/Hip Exercises: Standing   Lateral Step Up  Both;10 reps    Lateral Step Up Limitations  holding 2 dowels for balance     Forward Step Up  Both;10 reps    Forward Step Up Limitations  with opposite knee up for extra challenge.  Pt holding 2 dowel rods for balance       Shoulder Exercises: Standing   Protraction  AROM;Both;10 reps    Protraction Limitations  forearms on roller up the wall     Row  Strengthening;Right;Left;Both;10 reps;Theraband    Theraband Level (Shoulder Row)  Level 1 (Yellow)    Retraction  Strengthening;Right;Left;10 reps    Theraband Level (Shoulder Retraction)  Level 1 (Yellow)    Retraction Limitations  hands in yellow theraband loop with each elboe back for retraction     Other Standing Exercises  biceps curls with 3# x 20 reps while standing on airex / uneven surface       Shoulder Exercises: Therapy Ball   Flexion  Both;10 reps

## 2019-08-14 ENCOUNTER — Ambulatory Visit: Payer: Medicare Other

## 2019-08-14 ENCOUNTER — Other Ambulatory Visit: Payer: Self-pay

## 2019-08-14 DIAGNOSIS — R293 Abnormal posture: Secondary | ICD-10-CM

## 2019-08-14 DIAGNOSIS — M542 Cervicalgia: Secondary | ICD-10-CM

## 2019-08-14 DIAGNOSIS — L599 Disorder of the skin and subcutaneous tissue related to radiation, unspecified: Secondary | ICD-10-CM

## 2019-08-14 DIAGNOSIS — M6281 Muscle weakness (generalized): Secondary | ICD-10-CM

## 2019-08-14 DIAGNOSIS — C320 Malignant neoplasm of glottis: Secondary | ICD-10-CM | POA: Diagnosis not present

## 2019-08-14 NOTE — Therapy (Addendum)
[facility] [address] [place], [place], [address] Phone: [phone]   Fax:  [phone]  Physical Therapy Discharge Note  Patient Details  Name: Jean [name] MRN: [id number] Date of Birth: [birth year] Referring Provider (PT): Vertis Kelch Date: 08/14/2019  PT End of Session - 08/14/19 1002    Visit Number  9    Number of Visits  10    Date for PT Re-Evaluation  08/28/19    PT Start Time  0908    PT Stop Time  0959    PT Time Calculation (min)  51 min    Activity Tolerance  Patient tolerated treatment well    Behavior During Therapy  [facility] for tasks assessed/performed       Past Medical History:  Diagnosis Date  . Arthritis   . History of radiation therapy 05/03/19- 06/25/19   Larynx 35 fractions of 2 Gy each to total 70 Gy  . Hypertension   . Rheumatoid arthritis (HCC)   . Vocal cord mass     Past Surgical History:  Procedure Laterality Date  . ECTOPIC PREGNANCY SURGERY    . EXTERNAL EAR SURGERY Right    removal of cyst or gland  . IR GASTROSTOMY TUBE MOD SED  04/26/2019  . IR IMAGING GUIDED PORT INSERTION  04/26/2019  . MICROLARYNGOSCOPY Left 03/26/2019   Procedure: DIRECT MICROLARYNGOSCOPY WITH BIOPSY OF VOCAL CORD MASS;  Surgeon: Newman Pies, MD;  Location: [facility];  Service: ENT;  Laterality: Left;    There were no vitals filed for this visit.  Subjective Assessment - 08/14/19 0923    Subjective  I am feeling great. You guys have really helped me get going on my recovery. Doing my HEP daily, walking outside as the weather allows. I am ready to D/C today.    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  No/denies                       [facility] Adult  PT Treatment/Exercise - 08/14/19 0001      Knee/Hip Exercises: Standing   Heel Raises  Both;10 reps   Then Rt and Lt 5x each   Forward Step Up  Right;Left;5 reps;Step Height: 6"    SLS  Rt and Lt with fingertips on wall 3x each 5-7 sec each side    Other Standing Knee Exercises  Bil tandem stance with fingertips on wall 2x, 15 sec each      Shoulder Exercises: Standing   Horizontal ABduction  Strengthening;Both;5 reps;Theraband   head, shoulders against wall and back with core engaged   Theraband Level (Shoulder Horizontal ABduction)  Level 3 (Green)    Horizontal ABduction Limitations  Same starting position same for all standing exs    External Rotation  Strengthening;Both;5 reps;Theraband    Theraband Level (Shoulder External Rotation)  Level 3 (Green)    Diagonals  Strengthening;Right;Left;5 reps;Theraband    Theraband Level (Shoulder Diagonals)  Level 3 (Green)    Other Standing Exercises  Bil UE 3 way raises with 1 lb into flexion, scaption and abduction to shoulder height x10 each with tactile cues for correct technique  PT Education - 08/14/19 0953    Education Details  Standing 3 way raises bil UE and balance exercises; also issued green theraband for progression at home with scapular series when ready    Person(s) Educated  Patient    Methods  Explanation;Demonstration;Handout    Comprehension  Verbalized understanding;Returned demonstration          PT Long Term Goals - 08/14/19 0909      PT LONG TERM GOAL #1   Title  Pt will demonstrate UE and LE strength grossly 4/5-5/5 to allow pt to return to PLOF after completing treatments.    Baseline  07/31/19- 4-/5 grossly bilat hips, 4/5 UEs; Bil UE's grossly Rt 5/5 and Lt 4+/5 and LE's grossly 5/5 - 08/14/19    Status  Achieved      PT LONG TERM GOAL #2   Title  Pt will be able to complete 12 sit to stands in 30 seconds without using hands to allow pt to return to prior level of function.    Baseline  7  reps which is below avg for her ago, 07/31/19- 11 reps; 12 reps in 30 sec - 08/14/19    Status  Achieved      PT LONG TERM GOAL #3   Title  Pt will demonstrate neck ROM WFL in direction of lateral flexion and rotation bilaterally.    Baseline  Rotation to L and R- 25% limited, lateral flexion to L and R - 50% limited, 07/31/19- R rot WFL, L 15% limited, lateral flexion L 20% limited, lateral flexion R WFL; bil lateral flexion and rotation WFL now with pt just reporting tightness at end motions - 08/14/19    Status  Achieved      PT LONG TERM GOAL #4   Title  Pt will be independent in a home exercise program for continued strengthening and stretching.    Baseline  Pt is independent now with HEP with cervical ROM and scapular series, also daily walking routine when weather permits - 08/14/19    Status  Achieved            Plan - 08/14/19 1003    Clinical Impression Statement  Pt has done excellent with this episode of therapy. She has met all goals and is ready for D/C at  this time.    Personal Factors and Comorbidities  Age;Comorbidity 1    Comorbidities  rheumatoid arthritis    Examination-Activity Limitations  Squat;Lift;Sleep    Stability/Clinical Decision Making  Evolving/Moderate complexity    Rehab Potential  Good    PT Frequency  1x / week    PT Duration  4 weeks    PT Treatment/Interventions  ADLs/Self Care Home Management;Therapeutic exercise;Patient/family education;Compression bandaging;Manual lymph drainage;Manual techniques;Passive range of motion;Taping    PT Next Visit Plan  D/C this visit    PT Home Exercise Plan  head and neck stretches, walking program, Meeks decompression, supine scap; bil UE 3 way raises and balance exercises    Consulted and Agree with Plan of Care  Patient       Patient will benefit from skilled therapeutic intervention in order to improve the following deficits and impairments:  Postural dysfunction, Decreased knowledge of precautions, Increased  edema, Decreased strength, Increased fascial restricitons, Pain, Decreased range of motion, Decreased endurance  Visit Diagnosis: Cervicalgia  Abnormal posture  Muscle weakness (generalized)  Disorder of the skin and subcutaneous tissue related to radiation, unspecified     Problem List Patient Active Problem  List   Diagnosis Date Noted  . Protein malnutrition (HCC) 05/16/2019  . Constipation due to opioid therapy 05/09/2019  . Port-A-Cath in place 05/02/2019  . 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 Vitamin D deficiency 01/23/2017  . Rheumatoid arthritis, seronegative, multiple sites (HCC) 01/19/2017  . High risk medication use 01/19/2017  . Primary osteoarthritis of both hands 01/19/2017  . Primary osteoarthritis of both knees 01/19/2017  . Fatigue 01/19/2017  . Thrombocytosis (HCC) 01/19/2017  . Primary osteoarthritis of both feet 01/19/2017  . Anemia due to antineoplastic chemotherapy 01/19/2017  . Screening-pulmonary TB 04/26/2016   PHYSICAL THERAPY DISCHARGE SUMMARY   Plan: Patient agrees to discharge.  Patient goals were met. Patient is being discharged due to meeting the stated rehab goals.  ?????       Hermenia Bers, PTA 08/14/2019, 10:06 AM  Harrel Carina, PT 08/15/19 8:07 AM   [facility] [address] [place], [place], [address] Phone: [phone]   Fax:  [phone]  Name: VANISSA GAGNON MRN: [id number] Date of Birth: 1945/07/27

## 2019-08-14 NOTE — Patient Instructions (Addendum)
3 Way Raises:      Starting Position:  Leaning against wall, walk feet a few inches away from the wall and make tummy tight (tuck hips underneath you) Press back/shoulders/head against wall as much as possible. Keep thumbs up to ceiling, elbows straight and shoulders relaxed/down throughout.  1. Lift arms in front to shoulder height 2. Lift arms a little wider into a "V" to shoulder height 3. Lift arms out to sides in a "T" to shoulder height  Perform 10 times in each direction. Hold 1-2 lbs to start with and work up to 2-3 sets of 10/day. Perform 3-4 times/week. Increase weight as able, decreasing sets of 10 each time you increase weights, then slowly working your way back up to 2-3 sets each time.    Heel Raise: Bilateral (Standing)   Cancer Rehab [phone]    Stand near counter for fingertip support if needed. Rise on balls of feet. Once this becomes easier work towards trying single leg.  Repeat __10-20__ times per set. Do _1-2___ sets per session. Do __2__ sessions per day.  SINGLE LIMB STANCE    Stand at counter (corner if you have one) for minimal arm support. Raise leg. Hold _10-15__ seconds. Repeat with other leg. Once this becomes easier, for increased challenge, close eyes with fingertips on counter for safety. _3-5__ reps per set, _2-3__ sets per day.  Tandem Stance    Stand at counter (corner if you have one). Right foot in front of left, heel touching toe both feet "straight ahead". Stand on Foot Triangle of Support with both feet. Balance in this position _10-20__ seconds. Do with left foot in front of right. Once this becomes easier, for increased challenge, close eyes with fingertips on counter for safety.  Step-Up: Forward    Move step-stool to counter or hold rail if at steps, but as little as able. Step up forward keeping opposite foot off step. Keep pelvis level and back straight. Hold the single leg stand for 3 seconds, then come back down slowly.    Do _10__ times, do _1-2_ sets, on each leg, _1-2__ times per day.

## 2019-09-03 DIAGNOSIS — M1990 Unspecified osteoarthritis, unspecified site: Secondary | ICD-10-CM | POA: Diagnosis not present

## 2019-09-03 DIAGNOSIS — M069 Rheumatoid arthritis, unspecified: Secondary | ICD-10-CM | POA: Diagnosis not present

## 2019-09-03 DIAGNOSIS — E785 Hyperlipidemia, unspecified: Secondary | ICD-10-CM | POA: Diagnosis not present

## 2019-09-03 DIAGNOSIS — I10 Essential (primary) hypertension: Secondary | ICD-10-CM | POA: Diagnosis not present

## 2019-09-14 ENCOUNTER — Other Ambulatory Visit: Payer: Self-pay | Admitting: *Deleted

## 2019-10-08 ENCOUNTER — Ambulatory Visit (HOSPITAL_COMMUNITY)
Admission: RE | Admit: 2019-10-08 | Discharge: 2019-10-08 | Disposition: A | Discharge status: Home / Self Care | Payer: Medicare Other | Source: Ambulatory Visit | Attending: Radiation Oncology | Admitting: Radiation Oncology

## 2019-10-08 ENCOUNTER — Other Ambulatory Visit: Payer: Self-pay

## 2019-10-08 DIAGNOSIS — N200 Calculus of kidney: Secondary | ICD-10-CM | POA: Diagnosis not present

## 2019-10-08 DIAGNOSIS — I2510 Atherosclerotic heart disease of native coronary artery without angina pectoris: Secondary | ICD-10-CM | POA: Insufficient documentation

## 2019-10-08 DIAGNOSIS — Z8521 Personal history of malignant neoplasm of larynx: Secondary | ICD-10-CM | POA: Insufficient documentation

## 2019-10-08 DIAGNOSIS — J439 Emphysema, unspecified: Secondary | ICD-10-CM | POA: Insufficient documentation

## 2019-10-08 DIAGNOSIS — I700 Atherosclerosis of aorta: Secondary | ICD-10-CM | POA: Diagnosis not present

## 2019-10-08 DIAGNOSIS — C760 Malignant neoplasm of head, face and neck: Secondary | ICD-10-CM | POA: Diagnosis not present

## 2019-10-08 LAB — GLUCOSE, CAPILLARY: Glucose-Capillary: 75 mg/dL (ref 70–99)

## 2019-10-08 MED ORDER — FLUDEOXYGLUCOSE F - 18 (FDG) INJECTION
5.8800 | Freq: Once | INTRAVENOUS | Status: AC
  Administered 2019-10-08: 11:00:00 5.88 via INTRAVENOUS

## 2019-10-08 NOTE — Progress Notes (Signed)
Jean [name] presents today for follow-up after completing raditation therapy glottis and left vocal cord on 06/25/2019  Pain issues, if any: No Using a feeding tube?: Yes, at least once a day without issue Weight changes, if any:  Wt Readings from Last 3 Encounters:  10/09/19 116 lb 9.6 oz (52.9 kg)  08/02/19 117 lb (53.1 kg)  08/01/19 112 lb 6.4 oz (51 kg)   Swallowing issues, if any: None (still cannot tolerate very spicy foods) Smoking or chewing tobacco? No Using fluoride trays daily? Using fluoride toothpaste at night Last ENT visit was on: Has not seen Dr. Leta Baptist since initial scoping. Other notable issues, if any: Going over PET scan results from 10/08/2019. Will F/U with Dr. Tish Men (medical oncologist) on 04/21/202.  Vitals:   10/09/19 1405  BP: 131/76  Pulse: 88  Resp: 20  Temp: 99.1 F (37.3 C)  SpO2: 100%  Weight: 116 lb 9.6 oz (52.9 kg)  Height: 5\' 3"  (1.6 m)

## 2019-10-09 ENCOUNTER — Other Ambulatory Visit: Payer: Self-pay

## 2019-10-09 ENCOUNTER — Ambulatory Visit
Admission: RE | Admit: 2019-10-09 | Discharge: 2019-10-09 | Disposition: A | Discharge status: Home / Self Care | Payer: Medicare Other | Source: Ambulatory Visit | Attending: Radiation Oncology | Admitting: Radiation Oncology

## 2019-10-09 ENCOUNTER — Encounter: Payer: Self-pay | Admitting: Radiation Oncology

## 2019-10-09 VITALS — BP 131/76 | HR 88 | Temp 99.1°F | Resp 20 | Ht 63.0 in | Wt 116.6 lb

## 2019-10-09 DIAGNOSIS — I700 Atherosclerosis of aorta: Secondary | ICD-10-CM | POA: Insufficient documentation

## 2019-10-09 DIAGNOSIS — I2510 Atherosclerotic heart disease of native coronary artery without angina pectoris: Secondary | ICD-10-CM | POA: Diagnosis not present

## 2019-10-09 DIAGNOSIS — N2889 Other specified disorders of kidney and ureter: Secondary | ICD-10-CM | POA: Insufficient documentation

## 2019-10-09 DIAGNOSIS — N200 Calculus of kidney: Secondary | ICD-10-CM | POA: Insufficient documentation

## 2019-10-09 DIAGNOSIS — Z79899 Other long term (current) drug therapy: Secondary | ICD-10-CM | POA: Diagnosis not present

## 2019-10-09 DIAGNOSIS — Z8589 Personal history of malignant neoplasm of other organs and systems: Secondary | ICD-10-CM | POA: Diagnosis not present

## 2019-10-09 DIAGNOSIS — I6522 Occlusion and stenosis of left carotid artery: Secondary | ICD-10-CM | POA: Diagnosis not present

## 2019-10-09 DIAGNOSIS — C320 Malignant neoplasm of glottis: Secondary | ICD-10-CM

## 2019-10-09 DIAGNOSIS — Z8521 Personal history of malignant neoplasm of larynx: Secondary | ICD-10-CM | POA: Diagnosis not present

## 2019-10-09 DIAGNOSIS — R600 Localized edema: Secondary | ICD-10-CM | POA: Diagnosis not present

## 2019-10-09 DIAGNOSIS — Z08 Encounter for follow-up examination after completed treatment for malignant neoplasm: Secondary | ICD-10-CM | POA: Diagnosis not present

## 2019-10-09 DIAGNOSIS — N1330 Unspecified hydronephrosis: Secondary | ICD-10-CM

## 2019-10-09 NOTE — Progress Notes (Signed)
Radiation Oncology         [phone] ________________________________  Name: Jean [name] MRN: [id number]  Date: 10/09/2019  DOB: Jun 18, 1946  Follow-Up Visit Note  CC: [name], MD  [name], MD  Diagnosis and Prior Radiotherapy:       ICD-10-CM   1. History of glottic cancer  Z85.21   2. 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 Nephrolithiasis  N20.0 Ambulatory referral to Urology  4. Hydronephrosis, unspecified hydronephrosis type  N13.30 Ambulatory referral to Urology  5. 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)  C32.0     CHIEF COMPLAINT:  Here for follow-up and surveillance of glottic cancer  Narrative:  The patient returns today for routine follow-up.  She feels well.  Her voice is normalized.  She is eating well.  Her taste has largely returned.  She is swallowing well.  Her skin has healed.    She is seeing her dentist and using fluoride toothpaste.  She is not smoking or chewing tobacco.  She still has limited tolerance of spicy foods.   Weight has increased slightly.              ALLERGIES:  is allergic to sulfa antibiotics.  Meds: Current Outpatient Medications  Medication Sig Dispense Refill  . amLODipine (NORVASC) 5 MG tablet Take 5 mg by mouth daily.  0  . Cholecalciferol (VITAMIN D) 50 MCG (2000 UT) CAPS Take 2,000 Units by mouth daily.     . folic acid (FOLVITE) 1 MG tablet TAKE 2 TABLETS(2 MG) BY MOUTH DAILY 180 tablet 3  . lidocaine-prilocaine (EMLA) cream Apply to affected area once 30 g 3  . losartan (COZAAR) 100 MG tablet Take 100 mg by mouth daily.   0  . Nutritional Supplements (FEEDING SUPPLEMENT, OSMOLITE 1.5 CAL,) LIQD Give 1 bottle Osmolite 1.5 via PEG with 60 mL free water before and after 5 times daily.  Give 30 mL Prosource or equivalent mixed with 90 mL of water via PEG and flush with 60 mL of water.  Drink or flush feeding tube with 240 mL of free water 3 times daily between tube feedings.  1185 mL 3  . Red Yeast Rice 500 MG/0.5GM POWD Take 500 mg by mouth every other day.    . sodium fluoride (PREVIDENT 5000 PLUS) 1.1 % CREA dental cream Apply to tooth brush. Brush teeth for 2 minutes. Spit out excess-DO NOT swallow. DO NOT rinse afterwards. Repeat nightly. 1 Tube prn  . vitamin B-12 (CYANOCOBALAMIN) 1000 MCG tablet Take 1,000 mcg by mouth daily.     No current facility-administered medications for this encounter.    Physical Findings: The patient is in no acute distress. Patient is alert and oriented. Wt Readings from Last 3 Encounters:  10/09/19 116 lb 9.6 oz (52.9 kg)  08/02/19 117 lb (53.1 kg)  08/01/19 112 lb 6.4 oz (51 kg)    height is 5\' 3"  (1.6 m) and weight is 116 lb 9.6 oz (52.9 kg). Her temperature is 99.1 F (37.3 C). Her blood pressure is 131/76 and her pulse is 88. Her respiration is 20 and oxygen saturation is 100%. .  General: Alert and oriented, in no acute distress HEENT: Head is normocephalic. Extraocular movements are intact. Oropharynx is notable for no lesions, oral cavity is moist with no thrush Neck: Neck is notable for no palpable adenopathy. Skin: Skin in treatment fields shows satisfactory healing  Chest: Port-A-Cath in place Psychiatric:  Judgment and insight are intact. Affect is appropriate.   Lab Findings: Lab Results  Component Value Date   WBC 4.1 08/01/2019   HGB 9.7 (L) 08/01/2019   HCT 30.3 (L) 08/01/2019   MCV 107.1 (H) 08/01/2019   PLT 275 08/01/2019    Lab Results  Component Value Date   TSH 1.120 08/01/2019    Radiographic Findings: NM PET Image Restag (PS) Skull Base To Thigh  Result Date: 10/08/2019 CLINICAL DATA:  Subsequent treatment strategy for head neck cancer. Glottic cancer. Evaluate response to therapy. Left arm COVID vaccine 09/19/2019. EXAM: NUCLEAR MEDICINE PET SKULL BASE TO THIGH TECHNIQUE: 5.9 mCi F-18 FDG was injected intravenously. Full-ring PET imaging was performed from the skull base to thigh after  the radiotracer. CT data was obtained and used for attenuation correction and anatomic localization. Fasting blood glucose: 75 mg/dl COMPARISON:  28/41/3244 neck CT and PET. FINDINGS: Mediastinal blood pool activity: SUV max 2.4 Liver activity: SUV max NA NECK: Extensive hypermetabolic "Braun" fat throughout the neck and chest. Given this limitation, no evidence of cervical nodal hypermetabolism. Complete hypermetabolic response to therapy of the left-sided glottic lesion. Incidental CT findings: Left maxillary sinus mucous retention cyst or polyp. Left carotid atherosclerosis. CHEST: Hypermetabolic brown fat within the high mediastinum. Middle mediastinal hypermetabolism may also be secondary to brown fat or nodal in etiology. Example node in the AP window measures 7 mm and a S.U.V. max of 3.9 on 70/4 versus 1.0 cm and a S.U.V. max of 2.7 on the prior exam. Incidental CT findings: Right Port-A-Cath tip at high right atrium. Aortic and coronary artery atherosclerosis. Centrilobular emphysema. ABDOMEN/PELVIS: No areas of abnormal hypermetabolism. Incidental CT findings: Hepatic cysts. Normal adrenal glands. Multiple small left renal collecting system calculi. Suggestion of right renal edema and caliectasis, similar. Gastrostomy tube. Abdominal aortic atherosclerosis. Mild pelvic floor laxity. Probable eccentric left position of the uterus, similar. SKELETON: No abnormal marrow activity. Incidental CT findings: none IMPRESSION: 1. Complete metabolic response to therapy of left-sided glottic primary. 2. Suboptimal evaluation for cervical nodal disease secondary to extensive hypermetabolic "brown" fat within the neck and chest. Given this limitation, no evidence of metastatic disease. 3. Decreased size of hypermetabolic mediastinal node, likely reactive. 4. Aortic atherosclerosis (ICD10-I70.0), coronary artery atherosclerosis and emphysema (ICD10-J43.9). 5. Similar appearance of the right kidney, possibly related to  chronic ureteropelvic junction obstruction versus pyelonephritis. 6. Left nephrolithiasis. Electronically Signed   By: Jeronimo Greaves M.D.   On: 10/08/2019 15:10    Impression/Plan:    1) Head and Neck Cancer Status: No evidence of disease; Jennifer Malmfelt, our patient navigator, will refer the patient back to otolaryngology for laryngoscopy in the next month to confirm absence of disease  2) Nutritional Status: Slight weight gain and eating well PEG tube: She is still using this once a day.  She will wean off of this and knows that it can be removed after she is maintaining her weight on a full oral intake for month  3) Risk Factors: The patient has been educated about risk factors including alcohol and tobacco abuse; they understand that avoidance of alcohol and tobacco is important to prevent recurrences as well as other cancers  4) Swallowing: Good function, instructed to continue speech-language pathology exercises  5) Dental: Encouraged to continue regular followup with dentistry, and dental hygiene including fluoride rinses.  She knows that if there is ever a recommendation for tooth to be pulled that she or her oral surgeon should notify me first and they  may need to also confirm with Dr. Kristin Bruins  6) Thyroid function: Check annually-this will be followed in medical oncology Lab Results  Component Value Date   TSH 1.120 08/01/2019    7) Other: She has incidental findings on her PET scan: Right kidney demonstrates chronic ureteropelvic junction obstruction versus pyelonephritis.  She also has left nephrolithiasis.  Refer to urology  8) Follow-up in 6 months with head and neck survivorship specialist.  Follow-up later this week with medical oncology.  Follow-up in 1 year with me.  Follow-up in 1 month with otolaryngology. The patient was encouraged to call with any issues or questions before then.  Date of service, in total, I spent 25 minutes on this encounter.  Patient was seen in  person.    _____________________________________   Lonie Peak, MD

## 2019-10-09 NOTE — Progress Notes (Addendum)
Oncology Nurse Navigator Documentation  I met with the patient during her scheduled follow up with Dr. Isidore Moos today for results of her recent PET scan.  She is feeling well, using her PEG tube once daily. She is aware that intake should be orally exclusively and her weight stable before removal of PEG. She will see Dr. Maylon Peppers tomorrow for further discussion. She is scheduled for follow up with Dr. Isidore Moos in one year and will see H&N survivorship in 6 months with Sandi Mealy PA.   At the request of Dr. Isidore Moos, I called ENT Dr. Deeann Saint office to coordinate appointment a follow up appointment.  Spoke with receptionist and scheduled Ms. Nevers for 10/30/19 at 3:50. I have notified Ms. Foutz of this appointment and she is agreeable to the date and time.   Ms. Crownover is aware that she can call me with any further questions or concerns.  Harlow Asa RN, BSN, OCN Head & Neck Oncology Nurse [place] [facility] Phone # [phone]  Fax # [phone]

## 2019-10-10 ENCOUNTER — Telehealth: Payer: Self-pay | Admitting: *Deleted

## 2019-10-10 ENCOUNTER — Inpatient Hospital Stay (HOSPITAL_BASED_OUTPATIENT_CLINIC_OR_DEPARTMENT_OTHER): Payer: Medicare Other | Admitting: Hematology

## 2019-10-10 ENCOUNTER — Inpatient Hospital Stay: Payer: Medicare Other

## 2019-10-10 ENCOUNTER — Other Ambulatory Visit: Payer: Self-pay

## 2019-10-10 ENCOUNTER — Telehealth: Payer: Self-pay | Admitting: Hematology

## 2019-10-10 ENCOUNTER — Encounter: Payer: Self-pay | Admitting: Hematology

## 2019-10-10 ENCOUNTER — Inpatient Hospital Stay: Payer: Medicare Other | Attending: Hematology

## 2019-10-10 VITALS — BP 146/74 | HR 82 | Temp 98.2°F | Resp 98 | Ht 63.0 in | Wt 117.3 lb

## 2019-10-10 DIAGNOSIS — D539 Nutritional anemia, unspecified: Secondary | ICD-10-CM | POA: Insufficient documentation

## 2019-10-10 DIAGNOSIS — E46 Unspecified protein-calorie malnutrition: Secondary | ICD-10-CM

## 2019-10-10 DIAGNOSIS — C320 Malignant neoplasm of glottis: Secondary | ICD-10-CM

## 2019-10-10 DIAGNOSIS — Z95828 Presence of other vascular implants and grafts: Secondary | ICD-10-CM | POA: Diagnosis not present

## 2019-10-10 DIAGNOSIS — Z79899 Other long term (current) drug therapy: Secondary | ICD-10-CM | POA: Insufficient documentation

## 2019-10-10 LAB — CBC WITH DIFFERENTIAL (CANCER CENTER ONLY)
Abs Immature Granulocytes: 0.01 10*3/uL (ref 0.00–0.07)
Basophils Absolute: 0 10*3/uL (ref 0.0–0.1)
Basophils Relative: 0 %
Eosinophils Absolute: 0 10*3/uL (ref 0.0–0.5)
Eosinophils Relative: 1 %
HCT: 33.4 % — ABNORMAL LOW (ref 36.0–46.0)
Hemoglobin: 10.6 g/dL — ABNORMAL LOW (ref 12.0–15.0)
Immature Granulocytes: 0 %
Lymphocytes Relative: 16 %
Lymphs Abs: 0.6 10*3/uL — ABNORMAL LOW (ref 0.7–4.0)
MCH: 32.8 pg (ref 26.0–34.0)
MCHC: 31.7 g/dL (ref 30.0–36.0)
MCV: 103.4 fL — ABNORMAL HIGH (ref 80.0–100.0)
Monocytes Absolute: 0.4 10*3/uL (ref 0.1–1.0)
Monocytes Relative: 10 %
Neutro Abs: 2.9 10*3/uL (ref 1.7–7.7)
Neutrophils Relative %: 73 %
Platelet Count: 268 10*3/uL (ref 150–400)
RBC: 3.23 MIL/uL — ABNORMAL LOW (ref 3.87–5.11)
RDW: 12.7 % (ref 11.5–15.5)
WBC Count: 4 10*3/uL (ref 4.0–10.5)
nRBC: 0 % (ref 0.0–0.2)

## 2019-10-10 LAB — CMP (CANCER CENTER ONLY)
ALT: 10 U/L (ref 0–44)
AST: 13 U/L — ABNORMAL LOW (ref 15–41)
Albumin: 3.6 g/dL (ref 3.5–5.0)
Alkaline Phosphatase: 73 U/L (ref 38–126)
Anion gap: 11 (ref 5–15)
BUN: 13 mg/dL (ref 8–23)
CO2: 26 mmol/L (ref 22–32)
Calcium: 8.9 mg/dL (ref 8.9–10.3)
Chloride: 106 mmol/L (ref 98–111)
Creatinine: 0.91 mg/dL (ref 0.44–1.00)
GFR, Est AFR Am: >60 mL/min (ref >60)
GFR, Estimated: >60 mL/min (ref >60)
Glucose, Bld: 85 mg/dL (ref 70–99)
Potassium: 4 mmol/L (ref 3.5–5.1)
Sodium: 143 mmol/L (ref 135–145)
Total Bilirubin: <0.2 mg/dL — ABNORMAL LOW (ref 0.3–1.2)
Total Protein: 6.6 g/dL (ref 6.5–8.1)

## 2019-10-10 LAB — TSH: TSH: 0.09 u[IU]/mL — ABNORMAL LOW (ref 0.308–3.960)

## 2019-10-10 MED ORDER — HEPARIN SOD (PORK) LOCK FLUSH 100 UNIT/ML IV SOLN
500.0000 [IU] | Freq: Once | INTRAVENOUS | Status: AC | PRN
  Administered 2019-10-10: 14:00:00 500 [IU]
  Filled 2019-10-10: qty 5

## 2019-10-10 MED ORDER — SODIUM CHLORIDE 0.9% FLUSH
10.0000 mL | INTRAVENOUS | Status: DC | PRN
  Administered 2019-10-10: 10 mL
  Filled 2019-10-10: qty 10

## 2019-10-10 NOTE — Telephone Encounter (Signed)
CALLED PATIENT TO INFORM OF [place] APPT. WITH DR. MANNY [facility] ON - MAY 25 - ARRIVAL TIME- 2:45 PM, LVM FOR A RETURN CALL

## 2019-10-10 NOTE — Telephone Encounter (Signed)
Scheduled per los. Gave avs and calendar  

## 2019-10-10 NOTE — Telephone Encounter (Signed)
CALLED [facility] OFFICE AND LVM FOR 6 MONTH APPT. TO BE ARRANGED FOR THIS PATIENT

## 2019-10-10 NOTE — Progress Notes (Signed)
[place] OFFICE PROGRESS NOTE  Patient Care Team: Charolette Forward, MD as PCP - General (Cardiology) Wyatt Portela, MD as Consulting Physician (Oncology) Charolette Forward, MD as Referring Physician (Cardiology) Eppie Gibson, MD as Attending Physician (Radiation Oncology) Leota Sauers, RN (Inactive) as Oncology Nurse Navigator Malmfelt, Stephani Police, RN as Oncology Nurse Navigator (Oncology)  HEME/ONC OVERVIEW: 1. 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 FDG-avid left glottic malignancy on PET without definite metastatic disease  -04/2019 - 06/2019: definitive chemoRT with weekly cisplatin   NED on post-treatment PET in 09/2019  -On surveillance  2. Port and PEG in 04/2019   TREATMENT SUMMARY:  05/02/2019 - 06/25/2019: definitive chemoRT with weekly cisplatin   On surveillance   PERTINENT NON-HEM/ONC PROBLEMS: 1. 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  In summary, PET showed complete metabolic response to chemoradiation in the left side of the glottic primary malignancy.   -The evaluation of the cervical lymph nodes was suboptimal due to the nonspecific hypermetabolic activity, but given the negative cervical lymph node involvement on the initial presentation, there was a low suspicion for new disease in the cervical lymph nodes. -I reviewed imaging results in detail with the patient, as well as NCCN guideline for disease surveillance -I counseled the patient on the importance of ENT surveillance, currently  scheduled on 10/30/2019 -In absence of any clinically suspicious symptoms, there is no indication for routine imaging surveillance -Periodic TSH monitoring  Macrocytic anemia -Likely multifactorial, including recent chemotherapy and treatment for rheumatoid arthritis (including hx of methotrexate) -Hgb 10.6 today, improving since the last visit  -Patient denies any symptom of bleeding -On folic acid 2mg  daily  -We will monitor it closely  Protein malnutrition -Secondary to chemoradiation -Weight improving since the last visit  -As she is currently able to eat and drink without significant limitation, I have ordered feeding tube removal   Port-a-cath in place -Given the CR on PET, I have ordered port removal  Orders Placed This Encounter  Procedures  . IR GASTROSTOMY TUBE REMOVAL/REPAIR    Standing Status:   Future    Standing Expiration Date:   12/09/2020    Order Specific Question:   Reason for Exam (SYMPTOM  OR DIAGNOSIS REQUIRED)    Answer:   Completed chemoradiation    Order Specific Question:   Preferred Imaging Location?    Answer:   [facility]  . IR REMOVAL TUN ACCESS W/ PORT W/O FL MOD SED    Standing Status:   Future    Standing Expiration Date:   12/09/2020    Order Specific Question:   Reason for exam:    Answer:   Completed chemoradiation    Order Specific Question:   Preferred Imaging Location?    Answer:   [facility]  . CBC with Differential ([place] Only)    Standing Status:   Future    Standing Expiration Date:   11/13/2020  . CMP ([place] only)    Standing Status:   Future    Standing Expiration Date:   11/13/2020  .  TSH    Standing Status:   Future    Standing Expiration Date:   10/09/2020   The total time spent in the encounter was 30 minutes, including face-to-face time with the patient, review of various tests results, order additional studies/medications, documentation, and coordination of care plan.   All questions  were answered. The patient knows to call the clinic with any problems, questions or concerns. No barriers to learning was detected.  Return in 3 months for labs and clinic follow-up in the H&N survivorship clinic.   Tish Men, MD 4/21/20212:31 PM  CHIEF COMPLAINT: "I am doing much better"  INTERVAL HISTORY: Ms. Pigman returns clinic for follow-up of history of squamous cell carcinoma of the left vocal cord.  Patient reports that her appetite is improving, and she is able to eat and drink without any limitation.  She does not use her feeding tube very regularly except water flushes.  The hoarseness of voice is also improving.  She has an appointment with Dr. Benjamine Mola on Oct 30, 2019.  She denies any other complaint today.  REVIEW OF SYSTEMS:   Constitutional: ( - ) fevers, ( - )  chills , ( - ) night sweats Eyes: ( - ) blurriness of vision, ( - ) double vision, ( - ) watery eyes Ears, nose, mouth, throat, and face: ( - ) mucositis, ( - ) sore throat Respiratory: ( - ) cough, ( - ) dyspnea, ( - ) wheezes Cardiovascular: ( - ) palpitation, ( - ) chest discomfort, ( - ) lower extremity swelling Gastrointestinal:  ( - ) nausea, ( - ) heartburn, ( - ) change in bowel habits Skin: ( - ) abnormal skin rashes Lymphatics: ( - ) new lymphadenopathy, ( - ) easy bruising Neurological: ( - ) numbness, ( - ) tingling, ( - ) new weaknesses Behavioral/Psych: ( - ) mood change, ( - ) new changes  All other systems were reviewed with the patient and are negative.  SUMMARY OF ONCOLOGIC HISTORY: Oncology History  Squamous cell carcinoma of left vocal cord ([place])  03/26/2019 Pathology Results   DIAGNOSIS:   A. VOCAL CORD MASS, LEFT, EXCISION:  - Squamous cell carcinoma.  - See comment.    04/06/2019 Initial Diagnosis   Squamous cell carcinoma of left vocal cord ([place])   04/10/2019 Imaging   PET: IMPRESSION: 1. Left glottic lesion is hypermetabolic with maximum SUV 8.9, compatible with malignancy. No  discrete adenopathy in the neck. 2. Extensive periarticular activity especially around the large joints, likely related to rheumatoid arthritis. 3. Small but hypermetabolic axillary and left external iliac lymph nodes are most likely reactive to the patient's rheumatoid arthritis, but merit surveillance. 4. Mild right hydronephrosis without hydroureter, query right UPJ narrowing. Consider follow renal sonography. 5. Prominent but not hypermetabolic left adnexa, possibly due to prominent ovary or left eccentricity of the uterus, correlate with surgical history. This could be further investigated with pelvic sonography if clinically warranted. 6. Other imaging findings of potential clinical significance: Chronic left maxillary sinusitis. Aortic Atherosclerosis (ICD10-I70.0). Coronary atherosclerosis. Emphysema (ICD10-J43.9). Sigmoid colon diverticulosis.   04/10/2019 Imaging   CT neck w/ contrast: IMPRESSION: 1. Left glottic mass extending to but not beyond the anterior commissure. There is extension into the left paraglottic fat without cartilage erosion. 2. No adenopathy. 3. Aortic Atherosclerosis (ICD10-I70.0) and Emphysema (ICD10-J43.9).   04/11/2019 Cancer Staging   Staging form: Larynx - Glottis, AJCC 8th Edition - Clinical stage from 04/11/2019: Stage III (cT3, cN0, cM0) -  Signed by Eppie Gibson, MD on 04/11/2019   05/03/2019 - 06/29/2019 Chemotherapy   The patient had palonosetron (ALOXI) injection 0.25 mg, 0.25 mg, Intravenous,  Once, 7 of 7 cycles Administration: 0.25 mg (05/03/2019), 0.25 mg (05/10/2019), 0.25 mg (05/18/2019), 0.25 mg (05/24/2019), 0.25 mg (05/31/2019), 0.25 mg (06/14/2019), 0.25 mg (06/23/2019) CISplatin (PLATINOL) 62 mg in sodium chloride 0.9 % 250 mL chemo infusion, 40 mg/m2 = 62 mg, Intravenous,  Once, 7 of 7 cycles Dose modification: 30 mg/m2 (original dose 40 mg/m2, Cycle 5, Reason: Dose not tolerated) Administration: 62 mg (05/03/2019), 62 mg  (05/10/2019), 62 mg (05/18/2019), 62 mg (05/24/2019), 46 mg (05/31/2019), 46 mg (06/14/2019), 46 mg (06/23/2019) fosaprepitant (EMEND) 150 mg, dexamethasone (DECADRON) 12 mg in sodium chloride 0.9 % 145 mL IVPB, , Intravenous,  Once, 7 of 7 cycles Administration:  (05/03/2019),  (05/10/2019),  (05/18/2019),  (05/24/2019),  (05/31/2019),  (06/14/2019),  (06/23/2019)  for chemotherapy treatment.      I have reviewed the past medical history, past surgical history, social history and family history with the patient and they are unchanged from previous note.  ALLERGIES:  is allergic to sulfa antibiotics.  MEDICATIONS:  Current Outpatient Medications  Medication Sig Dispense Refill  . amLODipine (NORVASC) 5 MG tablet Take 5 mg by mouth daily.  0  . Cholecalciferol (VITAMIN D) 50 MCG (2000 UT) CAPS Take 2,000 Units by mouth daily.     . folic acid (FOLVITE) 1 MG tablet TAKE 2 TABLETS(2 MG) BY MOUTH DAILY 180 tablet 3  . lidocaine-prilocaine (EMLA) cream Apply to affected area once 30 g 3  . losartan (COZAAR) 100 MG tablet Take 100 mg by mouth daily.   0  . Nutritional Supplements (FEEDING SUPPLEMENT, OSMOLITE 1.5 CAL,) LIQD Give 1 bottle Osmolite 1.5 via PEG with 60 mL free water before and after 5 times daily.  Give 30 mL Prosource or equivalent mixed with 90 mL of water via PEG and flush with 60 mL of water.  Drink or flush feeding tube with 240 mL of free water 3 times daily between tube feedings. 1185 mL 3  . Red Yeast Rice 500 MG/0.5GM POWD Take 500 mg by mouth every other day.    . sodium fluoride (PREVIDENT 5000 PLUS) 1.1 % CREA dental cream Apply to tooth brush. Brush teeth for 2 minutes. Spit out excess-DO NOT swallow. DO NOT rinse afterwards. Repeat nightly. 1 Tube prn  . vitamin B-12 (CYANOCOBALAMIN) 1000 MCG tablet Take 1,000 mcg by mouth daily.     No current facility-administered medications for this visit.    PHYSICAL EXAMINATION: ECOG PERFORMANCE STATUS: 1 - Symptomatic but completely  ambulatory  Today's Vitals   10/10/19 1410 10/10/19 1411  BP: (!) 146/74   Pulse: 82   Resp: (!) 98   Temp: 98.2 F (36.8 C)   SpO2: 100%   Weight: 117 lb 4.8 oz (53.2 kg)   Height: 5\' 3"  (1.6 m)   PainSc:  0-No pain   Body mass index is 20.78 kg/m.  Filed Weights   10/10/19 1410  Weight: 117 lb 4.8 oz (53.2 kg)    GENERAL: alert, no distress and comfortable SKIN: skin color, texture, turgor are normal, no rashes or significant lesions EYES: conjunctiva are pink and non-injected, sclera clear OROPHARYNX: no exudate, no erythema; lips, buccal mucosa, and tongue normal  NECK: supple, non-tender LYMPH:  no palpable lymphadenopathy in the cervical LUNGS: clear to auscultation with normal breathing effort HEART: regular rate & rhythm and no murmurs and  no lower extremity edema ABDOMEN: soft, non-tender, non-distended, normal bowel sounds Musculoskeletal: no cyanosis of digits and no clubbing  PSYCH: alert & oriented x 3, fluent speech NEURO: no focal motor/sensory deficits  LABORATORY DATA:  I have reviewed the data as listed    Component Value Date/Time   NA 141 08/01/2019 1525   NA 141 03/05/2014 1050   K 4.3 08/01/2019 1525   K 3.9 03/05/2014 1050   CL 106 08/01/2019 1525   CO2 25 08/01/2019 1525   CO2 24 03/05/2014 1050   GLUCOSE 81 08/01/2019 1525   GLUCOSE 99 03/05/2014 1050   BUN 16 08/01/2019 1525   BUN 10.6 03/05/2014 1050   CREATININE 0.75 08/01/2019 1525   CREATININE 0.74 07/26/2019 1009   CREATININE 0.8 03/05/2014 1050   CALCIUM 9.4 08/01/2019 1525   CALCIUM 8.8 03/05/2014 1050   PROT 6.5 08/01/2019 1525   PROT 6.5 03/05/2014 1050   ALBUMIN 3.9 08/01/2019 1525   ALBUMIN 3.2 (L) 03/05/2014 1050   AST 13 (L) 08/01/2019 1525   AST 12 03/05/2014 1050   ALT 12 08/01/2019 1525   ALT 28 03/05/2014 1050   ALKPHOS 69 08/01/2019 1525   ALKPHOS 85 03/05/2014 1050   BILITOT <0.2 (L) 08/01/2019 1525   BILITOT 0.20 03/05/2014 1050   GFRNONAA >60 08/01/2019  1525   GFRNONAA 82 07/26/2019 1009   GFRAA >60 08/01/2019 1525   GFRAA 94 07/26/2019 1009    No results found for: SPEP, UPEP  Lab Results  Component Value Date   WBC 4.0 10/10/2019   NEUTROABS 2.9 10/10/2019   HGB 10.6 (L) 10/10/2019   HCT 33.4 (L) 10/10/2019   MCV 103.4 (H) 10/10/2019   PLT 268 10/10/2019      Chemistry      Component Value Date/Time   NA 141 08/01/2019 1525   NA 141 03/05/2014 1050   K 4.3 08/01/2019 1525   K 3.9 03/05/2014 1050   CL 106 08/01/2019 1525   CO2 25 08/01/2019 1525   CO2 24 03/05/2014 1050   BUN 16 08/01/2019 1525   BUN 10.6 03/05/2014 1050   CREATININE 0.75 08/01/2019 1525   CREATININE 0.74 07/26/2019 1009   CREATININE 0.8 03/05/2014 1050      Component Value Date/Time   CALCIUM 9.4 08/01/2019 1525   CALCIUM 8.8 03/05/2014 1050   ALKPHOS 69 08/01/2019 1525   ALKPHOS 85 03/05/2014 1050   AST 13 (L) 08/01/2019 1525   AST 12 03/05/2014 1050   ALT 12 08/01/2019 1525   ALT 28 03/05/2014 1050   BILITOT <0.2 (L) 08/01/2019 1525   BILITOT 0.20 03/05/2014 1050       RADIOGRAPHIC STUDIES: I have personally reviewed the radiological images as listed below and agreed with the findings in the report. NM PET Image Restag (PS) Skull Base To Thigh  Result Date: 10/08/2019 CLINICAL DATA:  Subsequent treatment strategy for head neck cancer. Glottic cancer. Evaluate response to therapy. Left arm COVID vaccine 09/19/2019. EXAM: NUCLEAR MEDICINE PET SKULL BASE TO THIGH TECHNIQUE: 5.9 mCi F-18 FDG was injected intravenously. Full-ring PET imaging was performed from the skull base to thigh after the radiotracer. CT data was obtained and used for attenuation correction and anatomic localization. Fasting blood glucose: 75 mg/dl COMPARISON:  04/10/2019 neck CT and PET. FINDINGS: Mediastinal blood pool activity: SUV max 2.4 Liver activity: SUV max NA NECK: Extensive hypermetabolic "Braun" fat throughout the neck and chest. Given this limitation, no evidence  of cervical nodal hypermetabolism. Complete hypermetabolic  response to therapy of the left-sided glottic lesion. Incidental CT findings: Left maxillary sinus mucous retention cyst or polyp. Left carotid atherosclerosis. CHEST: Hypermetabolic brown fat within the high mediastinum. Middle mediastinal hypermetabolism may also be secondary to brown fat or nodal in etiology. Example node in the AP window measures 7 mm and a S.U.V. max of 3.9 on 70/4 versus 1.0 cm and a S.U.V. max of 2.7 on the prior exam. Incidental CT findings: Right Port-A-Cath tip at high right atrium. Aortic and coronary artery atherosclerosis. Centrilobular emphysema. ABDOMEN/PELVIS: No areas of abnormal hypermetabolism. Incidental CT findings: Hepatic cysts. Normal adrenal glands. Multiple small left renal collecting system calculi. Suggestion of right renal edema and caliectasis, similar. Gastrostomy tube. Abdominal aortic atherosclerosis. Mild pelvic floor laxity. Probable eccentric left position of the uterus, similar. SKELETON: No abnormal marrow activity. Incidental CT findings: none IMPRESSION: 1. Complete metabolic response to therapy of left-sided glottic primary. 2. Suboptimal evaluation for cervical nodal disease secondary to extensive hypermetabolic "brown" fat within the neck and chest. Given this limitation, no evidence of metastatic disease. 3. Decreased size of hypermetabolic mediastinal node, likely reactive. 4. Aortic atherosclerosis (ICD10-I70.0), coronary artery atherosclerosis and emphysema (ICD10-J43.9). 5. Similar appearance of the right kidney, possibly related to chronic ureteropelvic junction obstruction versus pyelonephritis. 6. Left nephrolithiasis. Electronically Signed   By: Abigail Miyamoto M.D.   On: 10/08/2019 15:10

## 2019-10-24 NOTE — Progress Notes (Signed)
Office Visit Note  Patient: Jean [name]             Date of Birth: [birth year]           MRN: [id number]             PCP: [name], MD Referring: [name], MD Visit Date: 11/01/2019 Occupation: @GUAROCC @  Subjective:  Routine follow up for rheumatoid arthritis  History of Present Illness: Jean [name] is a 74 y.o. female with history of seronegative rheumatoid arthritis and osteoarthritis.  She denies any recent rheumatoid arthritis flares.  She is not taking any suppressive agents at this time.  Her last round of chemotherapy was on 06/14/2019.  She is also completed radiation therapy.  She denies any joint pain or joint swelling at this time.  She denies any nocturnal pain.  Her morning stiffness has been lasting about 5 to 10 minutes. She is scheduled to update DEXA on 11/02/2019.   Activities of Daily Living:  Patient reports morning stiffness for 5-10   minutes.   Patient Denies nocturnal pain.  Difficulty dressing/grooming: Denies Difficulty climbing stairs: Denies Difficulty getting out of chair: Denies Difficulty using hands for taps, buttons, cutlery, and/or writing: Denies  Review of Systems  Constitutional: Negative for fatigue.  HENT: Negative for mouth sores, mouth dryness and nose dryness.   Eyes: Negative for pain, visual disturbance and dryness.  Respiratory: Negative for cough, hemoptysis, shortness of breath and difficulty breathing.   Cardiovascular: Negative for chest pain, palpitations, hypertension and swelling in legs/feet.  Gastrointestinal: Negative for blood in stool, constipation and diarrhea.  Endocrine: Negative for increased urination.  Genitourinary: Negative for painful urination.  Musculoskeletal: Negative for arthralgias, joint pain, joint swelling, myalgias, muscle weakness, morning stiffness, muscle tenderness and myalgias.  Skin: Negative for color change, pallor, rash, hair loss, nodules/bumps, skin tightness, ulcers  and sensitivity to sunlight.  Allergic/Immunologic: Negative for susceptible to infections.  Neurological: Negative for dizziness, numbness, headaches and weakness.  Hematological: Negative for swollen glands.  Psychiatric/Behavioral: Negative for depressed mood and sleep disturbance. The patient is not nervous/anxious.     PMFS History:  Patient Active Problem List   Diagnosis Date Noted  . Protein malnutrition (HCC) 05/16/2019  . Constipation due to opioid therapy 05/09/2019  . Port-A-Cath in place 05/02/2019  . 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 Vitamin D deficiency 01/23/2017  . Rheumatoid arthritis, seronegative, multiple sites (HCC) 01/19/2017  . High risk medication use 01/19/2017  . Primary osteoarthritis of both hands 01/19/2017  . Primary osteoarthritis of both knees 01/19/2017  . Fatigue 01/19/2017  . Thrombocytosis (HCC) 01/19/2017  . Primary osteoarthritis of both feet 01/19/2017  . Macrocytic anemia 01/19/2017  . Screening-pulmonary TB 04/26/2016    Past Medical History:  Diagnosis Date  . Arthritis   . History of radiation therapy 05/03/19- 06/25/19   Larynx 35 fractions of 2 Gy each to total 70 Gy  . Hypertension   . Rheumatoid arthritis (HCC)   . Vocal cord mass     Family History  Problem Relation Age of Onset  . Stroke Mother   . Hypertension Daughter    Past Surgical History:  Procedure Laterality Date  . ECTOPIC PREGNANCY SURGERY    . EXTERNAL EAR SURGERY Right    removal of cyst or gland  . IR GASTROSTOMY TUBE MOD SED  04/26/2019  . IR IMAGING GUIDED PORT INSERTION  04/26/2019  . MICROLARYNGOSCOPY Left 03/26/2019   Procedure:  DIRECT MICROLARYNGOSCOPY WITH BIOPSY OF VOCAL CORD MASS;  Surgeon: Newman Pies, MD;  Location: [facility];  Service: ENT;  Laterality: Left;   Social History   Social History Narrative   Patient was recently widowed in March 2020.   Patient has 1 [place] who [place]   Patient moved to [place] from [place] in January 2009.   Patient is a retired [employer] fund[employer].   Immunization History  Administered Date(s) Administered  . Influenza, High Dose Seasonal PF 06/09/2017     Objective: Vital Signs: BP 122/72 (BP Location: Left Arm, Patient Position: Sitting, Cuff Size: Small)   Pulse 90   Resp 12   Ht 5\' 3"  (1.6 m)   Wt 116 lb (52.6 kg)   BMI 20.55 kg/m    Physical Exam Vitals and nursing note reviewed.  Constitutional:      Appearance: She is well-developed.  HENT:     Head: Normocephalic and atraumatic.  Eyes:     Conjunctiva/sclera: Conjunctivae normal.  Pulmonary:     Effort: Pulmonary effort is normal.  Abdominal:     General: Bowel sounds are normal.     Palpations: Abdomen is soft.  Musculoskeletal:     Cervical back: Normal range of motion.  Lymphadenopathy:     Cervical: No cervical adenopathy.  Skin:    General: Skin is warm and dry.     Capillary Refill: Capillary refill takes less than 2 seconds.  Neurological:     Mental Status: She is alert and oriented to person, place, and time.  Psychiatric:        Behavior: Behavior normal.      Musculoskeletal Exam: C-spine limited range of motion with lateral rotation.  She is good flexion-extension of the C-spine.  Thoracic and lumbar spine good range of motion.  No midline spinal tenderness.  No SI joint tenderness.  Shoulder joints, elbow joints, wrist joints, MCPs, PIPs and DIPs good range of motion with no synovitis.  She has complete fist formation bilaterally.  Hip joints, knee joints and ankle joints have good range of motion note discomfort.  No warmth or effusion of knee joints.  No tenderness or swelling of ankle joints.  CDAI Exam: CDAI Score: 0.2  Patient Global: 1 mm; Provider Global: 1 mm Swollen: 0 ; Tender: 0  Joint Exam 11/01/2019   No joint exam has been documented for this visit   There is currently no information documented on the homunculus. Go to the  Rheumatology activity and complete the homunculus joint exam.  Investigation: No additional findings.  Imaging: NM PET Image Restag (PS) Skull Base To Thigh  Result Date: 10/08/2019 CLINICAL DATA:  Subsequent treatment strategy for head neck cancer. Glottic cancer. Evaluate response to therapy. Left arm COVID vaccine 09/19/2019. EXAM: NUCLEAR MEDICINE PET SKULL BASE TO THIGH TECHNIQUE: 5.9 mCi F-18 FDG was injected intravenously. Full-ring PET imaging was performed from the skull base to thigh after the radiotracer. CT data was obtained and used for attenuation correction and anatomic localization. Fasting blood glucose: 75 mg/dl COMPARISON:  78/46/9629 neck CT and PET. FINDINGS: Mediastinal blood pool activity: SUV max 2.4 Liver activity: SUV max NA NECK: Extensive hypermetabolic "Braun" fat throughout the neck and chest. Given this limitation, no evidence of cervical nodal hypermetabolism. Complete hypermetabolic response to therapy of the left-sided glottic lesion. Incidental CT findings: Left maxillary sinus mucous retention cyst or polyp. Left carotid atherosclerosis. CHEST: Hypermetabolic brown fat within the high mediastinum. Middle mediastinal  hypermetabolism may also be secondary to brown fat or nodal in etiology. Example node in the AP window measures 7 mm and a S.U.V. max of 3.9 on 70/4 versus 1.0 cm and a S.U.V. max of 2.7 on the prior exam. Incidental CT findings: Right Port-A-Cath tip at high right atrium. Aortic and coronary artery atherosclerosis. Centrilobular emphysema. ABDOMEN/PELVIS: No areas of abnormal hypermetabolism. Incidental CT findings: Hepatic cysts. Normal adrenal glands. Multiple small left renal collecting system calculi. Suggestion of right renal edema and caliectasis, similar. Gastrostomy tube. Abdominal aortic atherosclerosis. Mild pelvic floor laxity. Probable eccentric left position of the uterus, similar. SKELETON: No abnormal marrow activity. Incidental CT findings:  none IMPRESSION: 1. Complete metabolic response to therapy of left-sided glottic primary. 2. Suboptimal evaluation for cervical nodal disease secondary to extensive hypermetabolic "brown" fat within the neck and chest. Given this limitation, no evidence of metastatic disease. 3. Decreased size of hypermetabolic mediastinal node, likely reactive. 4. Aortic atherosclerosis (ICD10-I70.0), coronary artery atherosclerosis and emphysema (ICD10-J43.9). 5. Similar appearance of the right kidney, possibly related to chronic ureteropelvic junction obstruction versus pyelonephritis. 6. Left nephrolithiasis. Electronically Signed   By: Jeronimo Greaves M.D.   On: 10/08/2019 15:10    Recent Labs: Lab Results  Component Value Date   WBC 4.0 10/31/2019   HGB 11.0 (L) 10/31/2019   PLT 277 10/31/2019   NA 141 10/31/2019   K 4.8 10/31/2019   CL 106 10/31/2019   CO2 27 10/31/2019   GLUCOSE 86 10/31/2019   BUN 15 10/31/2019   CREATININE 0.84 10/31/2019   BILITOT 0.3 10/31/2019   ALKPHOS 73 10/10/2019   AST 13 10/31/2019   ALT 10 10/31/2019   PROT 6.3 10/31/2019   ALBUMIN 3.6 10/10/2019   CALCIUM 9.7 10/31/2019   GFRAA 81 10/31/2019   QFTBGOLDPLUS NEGATIVE 01/15/2019    Speciality Comments: PPD- 07/08/17 Negative  Procedures:  No procedures performed Allergies: Sulfa antibiotics   Assessment / Plan:     Visit Diagnoses: Rheumatoid arthritis, seronegative, multiple sites (HCC) - She has been in remission since she had chemo and radiation therapy for management of squamous cell carcinoma of left vocal cord.  She completed chemotherapy on 06/14/2019.  She was previously on Humira and methotrexate but has not required immunosuppressive therapy since undergoing chemotherapy.  She has no tenderness or synovitis on examination today.  She has not had any recent rheumatoid arthritis flares.  She is not having any joint pain or inflammation.  She has not had any nocturnal pain.  Her morning stiffness is only been  lasting 5 to 10 minutes.  She does not want to restart on Humira or methotrexate at this time.  She was advised to notify us if she develops increased joint pain or joint swelling.  She will follow-up in the office in 5 months.  High risk medication use -Discontinued injectable methotrexate prior to starting radiation therapy in October 2020.  Discontinued Humira while on chemotherapy.  She completed chemotherapy on 06/14/2019.  She does not require any immunosuppressive therapy at this time.  She had updated lab work on 10/31/2019 which was reviewed with the patient today in the office.  Primary osteoarthritis of both hands: She has mild osteoarthritic changes but no tenderness or inflammation was noted.  She has complete fist formation bilaterally.  She experiences morning stiffness lasting about 5 to 10 minutes which improves with hand exercises.  Joint protection and muscle strengthening were discussed.  Primary osteoarthritis of both knees: She has good range of motion  of both knee joints with no discomfort.  No warmth or effusion noted.  Primary osteoarthritis of both feet: She is having any discomfort in her feet at this time.  She wears proper fitting shoes.  Other fatigue: Improving  History of vitamin D deficiency: She is taking vitamin D 2000 units daily.  Post-menopausal - DEXA order in place.  She will be going for her bone density tomorrow on 11/02/2019.  She continues to take a vitamin D supplement 2000 units daily.  Other medical conditions are listed as follows:  Thrombocytosis (HCC)  History of anemia  Orders: No orders of the defined types were placed in this encounter.  No orders of the defined types were placed in this encounter.     Follow-Up Instructions: Return in about 5 months (around 04/02/2020) for Rheumatoid arthritis, Osteoarthritis.   Sherron Ales, PA-C  I examined and evaluated the patient with Sherron Ales PA.  Patient is asymptomatic currently.  She  had no synovitis on my examination.  We will hold off treatment for right now.  If she develops any new symptoms she supposed to notify us.  The plan of care was discussed as noted above.  Pollyann Savoy, MD Note - This record has been created using Animal nutritionist.  Chart creation errors have been sought, but may not always  have been located. Such creation errors do not reflect on  the standard of medical care.

## 2019-10-31 ENCOUNTER — Other Ambulatory Visit: Payer: Self-pay

## 2019-10-31 DIAGNOSIS — Z79899 Other long term (current) drug therapy: Secondary | ICD-10-CM

## 2019-11-01 ENCOUNTER — Encounter: Payer: Self-pay | Admitting: Rheumatology

## 2019-11-01 ENCOUNTER — Ambulatory Visit: Payer: Medicare Other | Admitting: Rheumatology

## 2019-11-01 ENCOUNTER — Other Ambulatory Visit: Payer: Self-pay

## 2019-11-01 VITALS — BP 122/72 | HR 90 | Resp 12 | Ht 63.0 in | Wt 116.0 lb

## 2019-11-01 DIAGNOSIS — M170 Bilateral primary osteoarthritis of knee: Secondary | ICD-10-CM | POA: Diagnosis not present

## 2019-11-01 DIAGNOSIS — Z862 Personal history of diseases of the blood and blood-forming organs and certain disorders involving the immune mechanism: Secondary | ICD-10-CM

## 2019-11-01 DIAGNOSIS — Z780 Asymptomatic menopausal state: Secondary | ICD-10-CM

## 2019-11-01 DIAGNOSIS — D75839 Thrombocytosis, unspecified: Secondary | ICD-10-CM

## 2019-11-01 DIAGNOSIS — D473 Essential (hemorrhagic) thrombocythemia: Secondary | ICD-10-CM

## 2019-11-01 DIAGNOSIS — M19041 Primary osteoarthritis, right hand: Secondary | ICD-10-CM | POA: Diagnosis not present

## 2019-11-01 DIAGNOSIS — M19042 Primary osteoarthritis, left hand: Secondary | ICD-10-CM

## 2019-11-01 DIAGNOSIS — M0609 Rheumatoid arthritis without rheumatoid factor, multiple sites: Secondary | ICD-10-CM

## 2019-11-01 DIAGNOSIS — Z79899 Other long term (current) drug therapy: Secondary | ICD-10-CM | POA: Diagnosis not present

## 2019-11-01 DIAGNOSIS — Z8639 Personal history of other endocrine, nutritional and metabolic disease: Secondary | ICD-10-CM

## 2019-11-01 DIAGNOSIS — C320 Malignant neoplasm of glottis: Secondary | ICD-10-CM

## 2019-11-01 DIAGNOSIS — M19071 Primary osteoarthritis, right ankle and foot: Secondary | ICD-10-CM

## 2019-11-01 DIAGNOSIS — M19072 Primary osteoarthritis, left ankle and foot: Secondary | ICD-10-CM

## 2019-11-01 DIAGNOSIS — R5383 Other fatigue: Secondary | ICD-10-CM

## 2019-11-01 LAB — COMPLETE METABOLIC PANEL WITH GFR
AG Ratio: 1.9 (calc) (ref 1.0–2.5)
ALT: 10 U/L (ref 6–29)
AST: 13 U/L (ref 10–35)
Albumin: 4.1 g/dL (ref 3.6–5.1)
Alkaline phosphatase (APISO): 66 U/L (ref 37–153)
BUN: 15 mg/dL (ref 7–25)
CO2: 27 mmol/L (ref 20–32)
Calcium: 9.7 mg/dL (ref 8.6–10.4)
Chloride: 106 mmol/L (ref 98–110)
Creat: 0.84 mg/dL (ref 0.60–0.93)
GFR, Est African American: 81 mL/min/{1.73_m2} (ref >=60)
GFR, Est Non African American: 70 mL/min/{1.73_m2} (ref >=60)
Globulin: 2.2 g/dL (calc) (ref 1.9–3.7)
Glucose, Bld: 86 mg/dL (ref 65–99)
Potassium: 4.8 mmol/L (ref 3.5–5.3)
Sodium: 141 mmol/L (ref 135–146)
Total Bilirubin: 0.3 mg/dL (ref 0.2–1.2)
Total Protein: 6.3 g/dL (ref 6.1–8.1)

## 2019-11-01 LAB — CBC WITH DIFFERENTIAL/PLATELET
Absolute Monocytes: 432 cells/uL (ref 200–950)
Basophils Absolute: 12 cells/uL (ref 0–200)
Basophils Relative: 0.3 %
Eosinophils Absolute: 32 cells/uL (ref 15–500)
Eosinophils Relative: 0.8 %
HCT: 33.7 % — ABNORMAL LOW (ref 35.0–45.0)
Hemoglobin: 11 g/dL — ABNORMAL LOW (ref 11.7–15.5)
Lymphs Abs: 564 cells/uL — ABNORMAL LOW (ref 850–3900)
MCH: 32 pg (ref 27.0–33.0)
MCHC: 32.6 g/dL (ref 32.0–36.0)
MCV: 98 fL (ref 80.0–100.0)
MPV: 9.5 fL (ref 7.5–12.5)
Monocytes Relative: 10.8 %
Neutro Abs: 2960 cells/uL (ref 1500–7800)
Neutrophils Relative %: 74 %
Platelets: 277 10*3/uL (ref 140–400)
RBC: 3.44 10*6/uL — ABNORMAL LOW (ref 3.80–5.10)
RDW: 12 % (ref 11.0–15.0)
Total Lymphocyte: 14.1 %
WBC: 4 10*3/uL (ref 3.8–10.8)

## 2019-11-01 NOTE — Progress Notes (Signed)
CMP is normal.  CBC shows anemia which is improved.  Lymphocyte count is low.  We will continue to monitor.

## 2019-11-02 ENCOUNTER — Other Ambulatory Visit: Payer: Self-pay | Admitting: Radiology

## 2019-11-02 ENCOUNTER — Encounter: Payer: Self-pay | Admitting: Rheumatology

## 2019-11-05 ENCOUNTER — Other Ambulatory Visit: Payer: Self-pay

## 2019-11-05 ENCOUNTER — Ambulatory Visit (HOSPITAL_COMMUNITY)
Admission: RE | Admit: 2019-11-05 | Discharge: 2019-11-05 | Disposition: A | Discharge status: Home / Self Care | Payer: Medicare Other | Source: Ambulatory Visit | Attending: Hematology | Admitting: Hematology

## 2019-11-05 ENCOUNTER — Encounter (HOSPITAL_COMMUNITY): Payer: Self-pay

## 2019-11-05 DIAGNOSIS — Z923 Personal history of irradiation: Secondary | ICD-10-CM | POA: Insufficient documentation

## 2019-11-05 DIAGNOSIS — M069 Rheumatoid arthritis, unspecified: Secondary | ICD-10-CM | POA: Insufficient documentation

## 2019-11-05 DIAGNOSIS — Z431 Encounter for attention to gastrostomy: Secondary | ICD-10-CM | POA: Insufficient documentation

## 2019-11-05 DIAGNOSIS — Z9221 Personal history of antineoplastic chemotherapy: Secondary | ICD-10-CM | POA: Diagnosis not present

## 2019-11-05 DIAGNOSIS — C320 Malignant neoplasm of glottis: Secondary | ICD-10-CM | POA: Insufficient documentation

## 2019-11-05 DIAGNOSIS — Z79899 Other long term (current) drug therapy: Secondary | ICD-10-CM | POA: Diagnosis not present

## 2019-11-05 DIAGNOSIS — I10 Essential (primary) hypertension: Secondary | ICD-10-CM | POA: Insufficient documentation

## 2019-11-05 DIAGNOSIS — Z452 Encounter for adjustment and management of vascular access device: Secondary | ICD-10-CM | POA: Diagnosis not present

## 2019-11-05 HISTORY — PX: IR GASTROSTOMY TUBE REMOVAL: IMG5492

## 2019-11-05 HISTORY — PX: IR REMOVAL TUN ACCESS W/ PORT W/O FL MOD SED: IMG2290

## 2019-11-05 LAB — CBC WITH DIFFERENTIAL/PLATELET
Abs Immature Granulocytes: 0.03 10*3/uL (ref 0.00–0.07)
Basophils Absolute: 0 10*3/uL (ref 0.0–0.1)
Basophils Relative: 0 %
Eosinophils Absolute: 0 10*3/uL (ref 0.0–0.5)
Eosinophils Relative: 0 %
HCT: 36.8 % (ref 36.0–46.0)
Hemoglobin: 11.6 g/dL — ABNORMAL LOW (ref 12.0–15.0)
Immature Granulocytes: 1 %
Lymphocytes Relative: 11 %
Lymphs Abs: 0.6 10*3/uL — ABNORMAL LOW (ref 0.7–4.0)
MCH: 31.6 pg (ref 26.0–34.0)
MCHC: 31.5 g/dL (ref 30.0–36.0)
MCV: 100.3 fL — ABNORMAL HIGH (ref 80.0–100.0)
Monocytes Absolute: 0.4 10*3/uL (ref 0.1–1.0)
Monocytes Relative: 6 %
Neutro Abs: 4.8 10*3/uL (ref 1.7–7.7)
Neutrophils Relative %: 82 %
Platelets: 275 10*3/uL (ref 150–400)
RBC: 3.67 MIL/uL — ABNORMAL LOW (ref 3.87–5.11)
RDW: 12.7 % (ref 11.5–15.5)
WBC: 5.9 10*3/uL (ref 4.0–10.5)
nRBC: 0 % (ref 0.0–0.2)

## 2019-11-05 LAB — PROTIME-INR
INR: 1 (ref 0.8–1.2)
Prothrombin Time: 12.6 seconds (ref 11.4–15.2)

## 2019-11-05 MED ORDER — LIDOCAINE VISCOUS HCL 2 % MT SOLN
OROMUCOSAL | Status: AC
  Filled 2019-11-05: qty 15

## 2019-11-05 MED ORDER — LIDOCAINE VISCOUS HCL 2 % MT SOLN
OROMUCOSAL | Status: AC
  Administered 2019-11-05: 4 mL via GASTROSTOMY
  Filled 2019-11-05: qty 15

## 2019-11-05 MED ORDER — LIDOCAINE-EPINEPHRINE (PF) 2 %-1:200000 IJ SOLN
INTRAMUSCULAR | Status: AC
  Filled 2019-11-05: qty 20

## 2019-11-05 MED ORDER — CEFAZOLIN SODIUM-DEXTROSE 2-4 GM/100ML-% IV SOLN
2.0000 g | INTRAVENOUS | Status: AC
  Administered 2019-11-05: 2 g via INTRAVENOUS

## 2019-11-05 MED ORDER — CEFAZOLIN SODIUM-DEXTROSE 2-4 GM/100ML-% IV SOLN
INTRAVENOUS | Status: AC
  Filled 2019-11-05: qty 100

## 2019-11-05 MED ORDER — SODIUM CHLORIDE 0.9 % IV SOLN: INTRAVENOUS | Status: DC

## 2019-11-05 MED ORDER — LIDOCAINE-EPINEPHRINE 1 %-1:100000 IJ SOLN
INTRAMUSCULAR | Status: AC
  Filled 2019-11-05: qty 1

## 2019-11-05 MED ORDER — FENTANYL CITRATE (PF) 100 MCG/2ML IJ SOLN
INTRAMUSCULAR | Status: AC
  Filled 2019-11-05: qty 2

## 2019-11-05 MED ORDER — LIDOCAINE-EPINEPHRINE (PF) 1 %-1:200000 IJ SOLN
INTRAMUSCULAR | Status: AC | PRN
  Administered 2019-11-05: 10 mL

## 2019-11-05 MED ORDER — FENTANYL CITRATE (PF) 100 MCG/2ML IJ SOLN
INTRAMUSCULAR | Status: AC | PRN
  Administered 2019-11-05 (×2): 50 ug via INTRAVENOUS

## 2019-11-05 NOTE — H&P (Signed)
Referring Physician(s): Zhao,Yan  Supervising Physician: Jacqulynn Cadet  Patient Status:  [facility]  Chief Complaint:  "I'm getting my port and stomach tube out"  Subjective: Pt familiar to IR service from port a cath and G tube placements on 04/26/19. She has a hx of squamous cell carcinoma of the left vocal cord and has completed chemoradiation. She is currently eating. Recent PET shows no evidence of residual disease. She presents today for port a cath and G tube removals. She denies fever,HA,CP,dyspnea, cough, back pain,N/V or bleeding. She does have some mild tenderness at G tube site.  Past Medical History:  Diagnosis Date  . Arthritis   . History of radiation therapy 05/03/19- 06/25/19   Larynx 35 fractions of 2 Gy each to total 70 Gy  . Hypertension   . Rheumatoid arthritis ([place])   . Vocal cord mass    Past Surgical History:  Procedure Laterality Date  . ECTOPIC PREGNANCY SURGERY    . EXTERNAL EAR SURGERY Right    removal of cyst or gland  . IR GASTROSTOMY TUBE MOD SED  04/26/2019  . IR IMAGING GUIDED PORT INSERTION  04/26/2019  . MICROLARYNGOSCOPY Left 03/26/2019   Procedure: DIRECT MICROLARYNGOSCOPY WITH BIOPSY OF VOCAL CORD MASS;  Surgeon: Leta Baptist, MD;  Location: [facility];  Service: ENT;  Laterality: Left;      Allergies: Sulfa antibiotics  Medications: Prior to Admission medications   Medication Sig Start Date End Date Taking? Authorizing Provider  amLODipine (NORVASC) 5 MG tablet Take 5 mg by mouth daily. 08/20/16   [provider]  Cholecalciferol (VITAMIN D) 50 MCG (2000 UT) CAPS Take 2,000 Units by mouth daily.     [provider]  folic acid (FOLVITE) 1 MG tablet TAKE 2 TABLETS(2 MG) BY MOUTH DAILY 09/19/18   Bo Merino, MD  losartan (COZAAR) 100 MG tablet Take 100 mg by mouth daily.  08/04/16   [provider]  Red Yeast Rice 500 MG/0.5GM POWD Take 500 mg by mouth every other day.    [provider]  sodium fluoride (PREVIDENT 5000 PLUS) 1.1 % CREA dental cream Apply to tooth brush. Brush teeth for 2 minutes. Spit out excess-DO NOT swallow. DO NOT rinse afterwards. Repeat nightly. 04/23/19   Lenn Cal, DDS  vitamin B-12 (CYANOCOBALAMIN) 1000 MCG tablet Take 1,000 mcg by mouth daily.    [provider]     Vital Signs: BP 138/80,  Temp 97.8, HR 88, R 18, O2 sats 100% RA    Physical Exam awake/alert; chest- distant BS bilat; clean, intact rt chest port site, heart- RRR; abd- soft,+BS, Intact G tube with no erythema or drainage, sl tender to palpation at insertion site; no LE edema  Imaging: No results found.  Labs:  CBC: Recent Labs    07/26/19 1009 08/01/19 1525 10/10/19 1402 10/31/19 1118  WBC 2.7* 4.1 4.0 4.0  HGB 9.9* 9.7* 10.6* 11.0*  HCT 29.5* 30.3* 33.4* 33.7*  PLT 277 275 268 277    COAGS: Recent Labs    04/26/19 1115  INR 1.0    BMP: Recent Labs    07/26/19 1009 08/01/19 1525 10/10/19 1402 10/31/19 1118  NA 142 141 143 141  K 4.4 4.3 4.0 4.8  CL 108 106 106 106  CO2 27 25 26 27   GLUCOSE 80 81 85 86  BUN 13 16 13 15   CALCIUM 9.5 9.4 8.9 9.7  CREATININE 0.74 0.75 0.91 0.84  GFRNONAA 82 >  60 >60 70  GFRAA 94 >60 >60 81    LIVER FUNCTION TESTS: Recent Labs    04/18/19 0822 04/18/19 0822 07/04/19 1542 07/04/19 1542 07/26/19 1009 08/01/19 1525 10/10/19 1402 10/31/19 1118  BILITOT 0.2*   < > <0.2*  --  0.3 <0.2* <0.2* 0.3  AST 19   < > 13*  --  15 13* 13* 13  ALT 22   < > 15  --  14 12 10 10   ALKPHOS 106  --  80  --   --  69 73  --   PROT 7.2   < > 5.8*   < > 6.2 6.5 6.6 6.3  ALBUMIN 3.8  --  3.2*  --   --  3.9 3.6  --    < > = values in this interval not displayed.    Assessment and Plan: Pt with hx of squamous cell carcinoma of the left vocal cord , s/p  completion of chemoradiation. She is currently eating. Recent PET shows no evidence of residual disease. She presents today for port a cath and G tube removals.  Details/risks of procedures, incl but not limited to, internal bleeding, infection, injury to adjacent structures d/w pt with her understanding and consent.    Electronically Signed: D. Rowe Robert, PA-C 11/05/2019, 10:35 AM   I spent a total of 20 minutes at the the patient's bedside AND on the patient's hospital floor or unit, greater than 50% of which was counseling/coordinating care for port a cath and gastrostomy tube removals

## 2019-11-05 NOTE — Discharge Instructions (Signed)
Implanted Port Removal, Care After This sheet gives you information about how to care for yourself after your procedure. Your health care provider may also give you more specific instructions. If you have problems or questions, contact your health care provider. What can I expect after the procedure? After the procedure, it is common to have:  Soreness or pain near your incision.  Some swelling or bruising near your incision. Follow these instructions at home: Medicines  Take over-the-counter and prescription medicines only as told by your health care provider.  If you were prescribed an antibiotic medicine, take it as told by your health care provider. Do not stop taking the antibiotic even if you start to feel better. Bathing  Do not take baths, swim, or use a hot tub until your health care provider approves. Ask your health care provider if you can take showers. You may only be allowed to take sponge baths. Incision care   Follow instructions from your health care provider about how to take care of your incision. Make sure you: ? Wash your hands with soap and water before you change your bandage (dressing). If soap and water are not available, use hand sanitizer. ? Change your dressing as told by your health care provider. ? Keep your dressing dry. ? Leave stitches (sutures), skin glue, or adhesive strips in place. These skin closures may need to stay in place for 2 weeks or longer. If adhesive strip edges start to loosen and curl up, you may trim the loose edges. Do not remove adhesive strips completely unless your health care provider tells you to do that.  Check your incision area every day for signs of infection. Check for: ? More redness, swelling, or pain. ? More fluid or blood. ? Warmth. ? Pus or a bad smell. Driving   Do not drive for 24 hours if you were given a medicine to help you relax (sedative) during your procedure.  If you did not receive a sedative, ask your  health care provider when it is safe to drive. Activity  Return to your normal activities as told by your health care provider. Ask your health care provider what activities are safe for you.  Do not lift anything that is heavier than 10 lb (4.5 kg), or the limit that you are told, until your health care provider says that it is safe.  Do not do activities that involve lifting your arms over your head. General instructions  Do not use any products that contain nicotine or tobacco, such as cigarettes and e-cigarettes. These can delay healing. If you need help quitting, ask your health care provider.  Keep all follow-up visits as told by your health care provider. This is important. Contact a health care provider if:  You have more redness, swelling, or pain around your incision.  You have more fluid or blood coming from your incision.  Your incision feels warm to the touch.  You have pus or a bad smell coming from your incision.  You have pain that is not relieved by your pain medicine. Get help right away if you have:  A fever or chills.  Chest pain.  Difficulty breathing. Summary  After the procedure, it is common to have pain, soreness, swelling, or bruising near your incision.  If you were prescribed an antibiotic medicine, take it as told by your health care provider. Do not stop taking the antibiotic even if you start to feel better.  Do not drive for 24 hours   if you were given a sedative during your procedure.  Return to your normal activities as told by your health care provider. Ask your health care provider what activities are safe for you. This information is not intended to replace advice given to you by your health care provider. Make sure you discuss any questions you have with your health care provider. Document Revised: 07/21/2017 Document Reviewed: 07/21/2017 Elsevier Patient Education  2020 [place].     PEG Tube Removal, Care After This sheet  gives you information about how to care for yourself after your procedure. Your health care provider may also give you more specific instructions. If you have problems or questions, contact your health care provider. What can I expect after the procedure? After the procedure, it is common to have:  Mild discomfort at the opening in your skin where the tube was removed (tube removal site).  A small amount of drainage from the opening. Follow these instructions at home: Care of the tube removal site  Follow instructions from your health care provider about how to take care of your tube removal site. Make sure you: ? Wash your hands with soap and water before you change your bandage (dressing). If soap and water are not available, use hand sanitizer. ? Change your dressing as told by your health care provider.  Check your tube removal site every day for signs of infection. Check for: ? Redness, swelling, or pain. ? Fluid or blood. ? Warmth. ? Pus or a bad smell.  Do not take baths, swim, or use a hot tub until your health care provider approves. Ask your health care provider if you may take showers. You may only be allowed to take sponge baths. Activity   Return to your normal activities as told by your health care provider. Ask your health care provider what activities are safe for you.  Do not lift anything that is heavier than 10 lb (4.5 kg), or the limit that you are told, until your health care provider says that it is safe. General instructions  Take over-the-counter and prescription medicines only as told by your health care provider.  Follow instructions from your health care provider about eating and drinking.  Keep all follow-up visits as told by your health care provider. This is important. Contact a health care provider if:  You have a fever.  You have pain in your abdomen.  You have redness, swelling, or pain around your tube removal site.  You have fluid or blood  coming from your tube removal site.  Your tube removal site feels warm to the touch.  You have pus or a bad smell coming from your tube removal site.  You have nausea or vomiting. Get help right away if:  You have persistent bleeding from your tube removal site.  You have severe pain in your abdomen.  You are not able to eat or drink anything by mouth. Summary  Follow instructions from your health care provider about how to take care of your tube removal site.  Do not take baths, swim, or use a hot tub until your health care provider approves. Ask your health care provider if you may take showers. You may only be allowed to take sponge baths.  Check your tube removal site every day for signs of infection.  Return to your normal activities as told by your health care provider. This information is not intended to replace advice given to you by your health care provider. Make  sure you discuss any questions you have with your health care provider. Document Revised: 05/20/2017 Document Reviewed: 02/07/2017 Elsevier Patient Education  [place].    Moderate Conscious Sedation, Adult, Care After These instructions provide you with information about caring for yourself after your procedure. Your health care provider may also give you more specific instructions. Your treatment has been planned according to current medical practices, but problems sometimes occur. Call your health care provider if you have any problems or questions after your procedure. What can I expect after the procedure? After your procedure, it is common:  To feel sleepy for several hours.  To feel clumsy and have poor balance for several hours.  To have poor judgment for several hours.  To vomit if you eat too soon. Follow these instructions at home: For at least 24 hours after the procedure:   Do not: ? Participate in activities where you could fall or become injured. ? Drive. ? Use heavy  machinery. ? Drink alcohol. ? Take sleeping pills or medicines that cause drowsiness. ? Make important decisions or sign legal documents. ? Take care of children on your own.  Rest. Eating and drinking  Follow the diet recommended by your health care provider.  If you vomit: ? Drink water, juice, or soup when you can drink without vomiting. ? Make sure you have little or no nausea before eating solid foods. General instructions  Have a responsible adult stay with you until you are awake and alert.  Take over-the-counter and prescription medicines only as told by your health care provider.  If you smoke, do not smoke without supervision.  Keep all follow-up visits as told by your health care provider. This is important. Contact a health care provider if:  You keep feeling nauseous or you keep vomiting.  You feel light-headed.  You develop a rash.  You have a fever. Get help right away if:  You have trouble breathing. This information is not intended to replace advice given to you by your health care provider. Make sure you discuss any questions you have with your health care provider. Document Revised: 05/20/2017 Document Reviewed: 09/27/2015 Elsevier Patient Education  2020 [employer].

## 2019-11-05 NOTE — Progress Notes (Signed)
Patient did not receive moderate sedation for removal of G tube and PAC

## 2019-11-05 NOTE — Procedures (Signed)
Interventional Radiology Procedure Note  Procedure:  1) Removal of right chest portacatheter 2.) Removal of G-tube  Complications: None  Estimated Blood Loss: None  Recommendations: - DC home  Signed,  Criselda Peaches, MD

## 2019-11-06 ENCOUNTER — Telehealth: Payer: Self-pay

## 2019-11-06 NOTE — Progress Notes (Signed)
Office Visit Note  Patient: Jean [name]             Date of Birth: 1945-10-30           MRN: [id number]             PCP: [name], MD Referring: [name], MD Visit Date: 11/12/2019 Occupation: @GUAROCC @  Subjective:  Results (Dexa, neck muscle joint pain )   History of Present Illness: Jean [name] is a 74 y.o. female with history of rheumatoid arthritis which is in remission.  She came today to discuss her bone density.  She states she has been experiencing some discomfort in her neck which has been radiating to her right side off and on.  She has not noticed any height loss.  Activities of Daily Living:  Patient reports morning stiffness for 0 none.   Patient Denies nocturnal pain.  Difficulty dressing/grooming: Denies Difficulty climbing stairs: Denies Difficulty getting out of chair: Denies Difficulty using hands for taps, buttons, cutlery, and/or writing: Denies  Review of Systems  Constitutional: Negative for fatigue, night sweats, weight gain and weight loss.  HENT: Negative for mouth sores, trouble swallowing, trouble swallowing, mouth dryness and nose dryness.   Eyes: Negative for pain, redness, visual disturbance and dryness.  Respiratory: Negative for cough, shortness of breath and difficulty breathing.   Cardiovascular: Negative for chest pain, palpitations, hypertension, irregular heartbeat and swelling in legs/feet.  Gastrointestinal: Negative for blood in stool, constipation and diarrhea.  Endocrine: Negative for excessive thirst and increased urination.  Genitourinary: Negative for difficulty urinating and vaginal dryness.  Musculoskeletal: Positive for muscle tenderness. Negative for arthralgias, joint pain, joint swelling, myalgias, muscle weakness, morning stiffness and myalgias.  Skin: Negative for color change, rash, hair loss, skin tightness, ulcers and sensitivity to sunlight.  Allergic/Immunologic: Negative for susceptible to  infections.  Neurological: Negative for dizziness, numbness, memory loss, night sweats and weakness.  Hematological: Negative for bruising/bleeding tendency and swollen glands.  Psychiatric/Behavioral: Negative for depressed mood and sleep disturbance. The patient is not nervous/anxious.     PMFS History:  Patient Active Problem List   Diagnosis Date Noted  . Protein malnutrition (HCC) 05/16/2019  . Constipation due to opioid therapy 05/09/2019  . Port-A-Cath in place 05/02/2019  . 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 Vitamin D deficiency 01/23/2017  . Rheumatoid arthritis, seronegative, multiple sites (HCC) 01/19/2017  . High risk medication use 01/19/2017  . Primary osteoarthritis of both hands 01/19/2017  . Primary osteoarthritis of both knees 01/19/2017  . Fatigue 01/19/2017  . Thrombocytosis (HCC) 01/19/2017  . Primary osteoarthritis of both feet 01/19/2017  . Macrocytic anemia 01/19/2017  . Screening-pulmonary TB 04/26/2016    Past Medical History:  Diagnosis Date  . Arthritis   . Cancer (HCC)   . History of radiation therapy 05/03/19- 06/25/19   Larynx 35 fractions of 2 Gy each to total 70 Gy  . Hypertension   . Rheumatoid arthritis (HCC)   . Vocal cord mass     Family History  Problem Relation Age of Onset  . Stroke Mother   . Hypertension Daughter    Past Surgical History:  Procedure Laterality Date  . ECTOPIC PREGNANCY SURGERY    . EXTERNAL EAR SURGERY Right    removal of cyst or gland  . IR GASTROSTOMY TUBE MOD SED  04/26/2019  . IR GASTROSTOMY TUBE REMOVAL  11/05/2019  . IR IMAGING GUIDED PORT INSERTION  04/26/2019  . IR  REMOVAL TUN ACCESS W/ PORT W/O FL MOD SED  11/05/2019  . MICROLARYNGOSCOPY Left 03/26/2019   Procedure: DIRECT MICROLARYNGOSCOPY WITH BIOPSY OF VOCAL CORD MASS;  Surgeon: Newman Pies, MD;  Location: [facility];  Service: ENT;  Laterality: Left;   Social History   Social History Narrative   Patient was  recently widowed in March 2020.   Patient has 1 daughter who lives in [place]   Patient moved to [place] from [place] in January 2009.   Patient is a retired [employer].   Immunization History  Administered Date(s) Administered  . Influenza, High Dose Seasonal PF 06/09/2017     Objective: Vital Signs: BP 100/62 (BP Location: Left Arm, Patient Position: Sitting, Cuff Size: Normal)   Pulse 86   Resp 16   Ht 5\' 3"  (1.6 m)   Wt 112 lb (50.8 kg)   BMI 19.84 kg/m    Physical Exam Vitals and nursing note reviewed.  Constitutional:      Appearance: She is well-developed.  HENT:     Head: Normocephalic and atraumatic.  Eyes:     Conjunctiva/sclera: Conjunctivae normal.  Cardiovascular:     Rate and Rhythm: Normal rate and regular rhythm.     Heart sounds: Normal heart sounds.  Pulmonary:     Effort: Pulmonary effort is normal.     Breath sounds: Normal breath sounds.  Abdominal:     General: Bowel sounds are normal.     Palpations: Abdomen is soft.  Musculoskeletal:     Cervical back: Normal range of motion.  Lymphadenopathy:     Cervical: No cervical adenopathy.  Skin:    General: Skin is warm and dry.     Capillary Refill: Capillary refill takes less than 2 seconds.  Neurological:     Mental Status: She is alert and oriented to person, place, and time.  Psychiatric:        Behavior: Behavior normal.      Musculoskeletal Exam: C-spine was in good range of motion.  She has some discomfort with the right lateral rotation.  Shoulder joints, elbow joints, wrist joints, MCPs, PIPs and DIPs with good range of motion with no synovitis.  Hip joints, knee joints, ankles, MTPs and PIPs with good range of motion with no synovitis.  CDAI Exam: CDAI Score: -- Patient Global: --; Provider Global: -- Swollen: --; Tender: -- Joint Exam 11/12/2019   No joint exam has been documented for this visit   There is currently no information documented on the homunculus. Go  to the Rheumatology activity and complete the homunculus joint exam.  Investigation: No additional findings.  Imaging: IR REMOVAL TUN ACCESS W/ PORT W/O FL MOD SED  Result Date: 11/05/2019 INDICATION: 74 year old female with a history of left-sided laryngeal cancer. She has a port catheter and gastrostomy tube which was placed by interventional radiology on 04/26/2019. Her devices have worked well and without issue. Fortunately, she has now completed her therapy and is considered disease free. Neither the port catheter nor the gastrostomy tube is required. She therefore presents for removal of both. EXAM: REMOVAL RIGHT IJ VEIN PORT-A-CATH MEDICATIONS: 2 g Ancef; The antibiotic was administered within an appropriate time interval prior to skin puncture. ANESTHESIA/SEDATION: 100 mcg fentanyl administered intravenously. This does not constitute moderate sedation. FLUOROSCOPY TIME:  None. COMPLICATIONS: None immediate. PROCEDURE: Informed written consent was obtained from the patient after a thorough discussion of the procedural risks, benefits and alternatives. All questions were addressed. Maximal Sterile Barrier  Technique was utilized including caps, mask, sterile gowns, sterile gloves, sterile drape, hand hygiene and skin antiseptic. A timeout was performed prior to the initiation of the procedure. The right chest was prepped and draped in a sterile fashion. Lidocaine was utilized for local anesthesia. An incision was made over the previously healed surgical incision. Utilizing blunt dissection, the port catheter and reservoir were removed from the underlying subcutaneous tissue in their entirety. Securing sutures were also removed. The pocket was irrigated with a copious amount of sterile normal saline. The pocket was closed with interrupted 3-0 Vicryl stitches. The subcutaneous tissue was closed with 3-0 Vicryl interrupted subcutaneous stitches. A 4-0 Vicryl running subcuticular stitch was utilized to  approximate the skin. Dermabond was applied. Attention was next turned to the percutaneous gastrostomy tube. The tube was removed using gentle but firm traction. The tube was inspected and found to be intact. A sterile bandage was placed. IMPRESSION: Successful right IJ vein Port-A-Cath explantation. Successful removal of percutaneous gastrostomy tube. Electronically Signed   By: Malachy Moan M.D.   On: 11/05/2019 12:55   IR GASTROSTOMY TUBE REMOVAL/REPAIR  Result Date: 11/05/2019 INDICATION: 74 year old female with a history of left-sided laryngeal cancer. She has a port catheter and gastrostomy tube which was placed by interventional radiology on 04/26/2019. Her devices have worked well and without issue. Fortunately, she has now completed her therapy and is considered disease free. Neither the port catheter nor the gastrostomy tube is required. She therefore presents for removal of both. EXAM: REMOVAL RIGHT IJ VEIN PORT-A-CATH MEDICATIONS: 2 g Ancef; The antibiotic was administered within an appropriate time interval prior to skin puncture. ANESTHESIA/SEDATION: 100 mcg fentanyl administered intravenously. This does not constitute moderate sedation. FLUOROSCOPY TIME:  None. COMPLICATIONS: None immediate. PROCEDURE: Informed written consent was obtained from the patient after a thorough discussion of the procedural risks, benefits and alternatives. All questions were addressed. Maximal Sterile Barrier Technique was utilized including caps, mask, sterile gowns, sterile gloves, sterile drape, hand hygiene and skin antiseptic. A timeout was performed prior to the initiation of the procedure. The right chest was prepped and draped in a sterile fashion. Lidocaine was utilized for local anesthesia. An incision was made over the previously healed surgical incision. Utilizing blunt dissection, the port catheter and reservoir were removed from the underlying subcutaneous tissue in their entirety. Securing sutures  were also removed. The pocket was irrigated with a copious amount of sterile normal saline. The pocket was closed with interrupted 3-0 Vicryl stitches. The subcutaneous tissue was closed with 3-0 Vicryl interrupted subcutaneous stitches. A 4-0 Vicryl running subcuticular stitch was utilized to approximate the skin. Dermabond was applied. Attention was next turned to the percutaneous gastrostomy tube. The tube was removed using gentle but firm traction. The tube was inspected and found to be intact. A sterile bandage was placed. IMPRESSION: Successful right IJ vein Port-A-Cath explantation. Successful removal of percutaneous gastrostomy tube. Electronically Signed   By: Malachy Moan M.D.   On: 11/05/2019 12:55    Recent Labs: Lab Results  Component Value Date   WBC 5.9 11/05/2019   HGB 11.6 (L) 11/05/2019   PLT 275 11/05/2019   NA 141 10/31/2019   K 4.8 10/31/2019   CL 106 10/31/2019   CO2 27 10/31/2019   GLUCOSE 86 10/31/2019   BUN 15 10/31/2019   CREATININE 0.84 10/31/2019   BILITOT 0.3 10/31/2019   ALKPHOS 73 10/10/2019   AST 13 10/31/2019   ALT 10 10/31/2019   PROT 6.3 10/31/2019  ALBUMIN 3.6 10/10/2019   CALCIUM 9.7 10/31/2019   GFRAA 81 10/31/2019   QFTBGOLDPLUS NEGATIVE 01/15/2019  May 14, 2021DXA showed T score of -3.4 in the AP spine, BMD 0.781.  No comparison was available.  Speciality Comments: PPD- 07/08/17 Negative  Procedures:  No procedures performed Allergies: Sulfa antibiotics   Assessment / Plan:     Visit Diagnoses: Age-related osteoporosis without current pathological fracture -I detailed discussion with the patient regarding osteoporosis.  Different treatment options and their side effects were discussed.  She wants to proceed with Fosamax.  Have given her prescription for Fosamax 70 mg p.o. weekly.  Instructions were given how to take the medication.  Handout was given.  If she has intolerance to the medication she should contact us.  I will obtain  following labs today.  Her last vitamin D was 30.  Plan: Parathyroid hormone, intact (no Ca), Serum protein electrophoresis with reflex  History of vitamin D deficiency-her last vitamin D was 30.  She has been taking vitamin D 1000 units a day.  Have advised her to increase vitamin D to 2000 units a day.  We will check a vitamin D level at the follow-up visit.  Neck pain-she has been having some neck pain and stiffness.  She also describes intermittent radiculopathy to the right side.  Have given her a handout on neck exercises.  Rheumatoid arthritis, seronegative, multiple sites (HCC) -in remission.  -since chemo/radiation for squamous cell carcinoma of L vocal cord.  She has been advised to contact us in case she develops any symptoms.  High risk medication use - D/c inj MTX prior to starting rad therapy in Oct 2020. D/c Humira prior to chemo.  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 Parathyroid hormone, intact (no Ca)  . Serum protein electrophoresis with reflex   Meds ordered this encounter  Medications  . alendronate (FOSAMAX) 70 MG tablet    Sig: Take 1 tablet (70 mg total) by mouth once a week. Take with a full glass of water on an empty stomach.    Dispense:  12 tablet    Refill:  1     Follow-Up Instructions: Return for Rheumatoid arthritis, Osteoporosis.   Pollyann Savoy, MD  Note - This record has been created using Animal nutritionist.  Chart creation errors have been sought, but may not always  have been located. Such creation errors do not reflect on  the standard of medical care.

## 2019-11-06 NOTE — Telephone Encounter (Signed)
DEXA received via fax from [facility].   Date of scan: 11/02/2019  Lowest site measured: AP spine  T-score: -3.4 BMD: 0.781  Patient currently taking vitamin D.   DEXA reviewed by Hazel Sams, PA-C and noted please schedule appt to discuss tx options.   I called patient and patient is scheduled for 11/12/2019 to discuss DEXA.

## 2019-11-12 ENCOUNTER — Other Ambulatory Visit: Payer: Self-pay

## 2019-11-12 ENCOUNTER — Encounter: Payer: Self-pay | Admitting: Physician Assistant

## 2019-11-12 ENCOUNTER — Ambulatory Visit (INDEPENDENT_AMBULATORY_CARE_PROVIDER_SITE_OTHER): Payer: Medicare Other | Admitting: Rheumatology

## 2019-11-12 VITALS — BP 100/62 | HR 86 | Resp 16 | Ht 63.0 in | Wt 112.0 lb

## 2019-11-12 DIAGNOSIS — Z79899 Other long term (current) drug therapy: Secondary | ICD-10-CM

## 2019-11-12 DIAGNOSIS — M810 Age-related osteoporosis without current pathological fracture: Secondary | ICD-10-CM | POA: Diagnosis not present

## 2019-11-12 DIAGNOSIS — Z8639 Personal history of other endocrine, nutritional and metabolic disease: Secondary | ICD-10-CM | POA: Diagnosis not present

## 2019-11-12 DIAGNOSIS — M542 Cervicalgia: Secondary | ICD-10-CM

## 2019-11-12 DIAGNOSIS — M0609 Rheumatoid arthritis without rheumatoid factor, multiple sites: Secondary | ICD-10-CM

## 2019-11-12 DIAGNOSIS — M19072 Primary osteoarthritis, left ankle and foot: Secondary | ICD-10-CM

## 2019-11-12 DIAGNOSIS — M170 Bilateral primary osteoarthritis of knee: Secondary | ICD-10-CM

## 2019-11-12 DIAGNOSIS — R5383 Other fatigue: Secondary | ICD-10-CM

## 2019-11-12 DIAGNOSIS — Z862 Personal history of diseases of the blood and blood-forming organs and certain disorders involving the immune mechanism: Secondary | ICD-10-CM

## 2019-11-12 DIAGNOSIS — M19042 Primary osteoarthritis, left hand: Secondary | ICD-10-CM

## 2019-11-12 DIAGNOSIS — M19071 Primary osteoarthritis, right ankle and foot: Secondary | ICD-10-CM

## 2019-11-12 DIAGNOSIS — C320 Malignant neoplasm of glottis: Secondary | ICD-10-CM

## 2019-11-12 DIAGNOSIS — Z780 Asymptomatic menopausal state: Secondary | ICD-10-CM

## 2019-11-12 DIAGNOSIS — D75839 Thrombocytosis, unspecified: Secondary | ICD-10-CM

## 2019-11-12 DIAGNOSIS — D473 Essential (hemorrhagic) thrombocythemia: Secondary | ICD-10-CM

## 2019-11-12 DIAGNOSIS — M19041 Primary osteoarthritis, right hand: Secondary | ICD-10-CM

## 2019-11-12 MED ORDER — ALENDRONATE SODIUM 70 MG PO TABS
70.0000 mg | ORAL_TABLET | ORAL | 1 refills | Status: DC
Start: 2019-11-12 — End: 2020-04-08

## 2019-11-12 NOTE — Patient Instructions (Addendum)
Please take calcium 1200 mg (dietary and supplement together)  by mouth daily in divided dosage.  Take vitamin D 2000 units daily.  Start Fosamax 70 mg by mouth once a week in the morning, empty stomach with at least 8 to 10 ounces of water.  Do not drink or eat anything for 30 minutes.  Stay upright after taking Fosamax.   Cervical Strain and Sprain Rehab Ask your health care provider which exercises are safe for you. Do exercises exactly as told by your health care provider and adjust them as directed. It is normal to feel mild stretching, pulling, tightness, or discomfort as you do these exercises. Stop right away if you feel sudden pain or your pain gets worse. Do not begin these exercises until told by your health care provider. Stretching and range-of-motion exercises Cervical side bending  1. Using good posture, sit on a stable chair or stand up. 2. Without moving your shoulders, slowly tilt your left / right ear to your shoulder until you feel a stretch in the opposite side neck muscles. You should be looking straight ahead. 3. Hold for __________ seconds. 4. Repeat with the other side of your neck. Repeat __________ times. Complete this exercise __________ times a day. Cervical rotation  1. Using good posture, sit on a stable chair or stand up. 2. Slowly turn your head to the side as if you are looking over your left / right shoulder. ? Keep your eyes level with the ground. ? Stop when you feel a stretch along the side and the back of your neck. 3. Hold for __________ seconds. 4. Repeat this by turning to your other side. Repeat __________ times. Complete this exercise __________ times a day. Thoracic extension and pectoral stretch 1. Roll a towel or a small blanket so it is about 4 inches (10 cm) in diameter. 2. Lie down on your back on a firm surface. 3. Put the towel lengthwise, under your spine in the middle of your back. It should not be under your shoulder blades. The  towel should line up with your spine from your middle back to your lower back. 4. Put your hands behind your head and let your elbows fall out to your sides. 5. Hold for __________ seconds. Repeat __________ times. Complete this exercise __________ times a day. Strengthening exercises Isometric upper cervical flexion 1. Lie on your back with a thin pillow behind your head and a small rolled-up towel under your neck. 2. Gently tuck your chin toward your chest and nod your head down to look toward your feet. Do not lift your head off the pillow. 3. Hold for __________ seconds. 4. Release the tension slowly. Relax your neck muscles completely before you repeat this exercise. Repeat __________ times. Complete this exercise __________ times a day. Isometric cervical extension  1. Stand about 6 inches (15 cm) away from a wall, with your back facing the wall. 2. Place a soft object, about 6-8 inches (15-20 cm) in diameter, between the back of your head and the wall. A soft object could be a small pillow, a ball, or a folded towel. 3. Gently tilt your head back and press into the soft object. Keep your jaw and forehead relaxed. 4. Hold for __________ seconds. 5. Release the tension slowly. Relax your neck muscles completely before you repeat this exercise. Repeat __________ times. Complete this exercise __________ times a day. Posture and body mechanics Body mechanics refers to the movements and positions of your body while  you do your daily activities. Posture is part of body mechanics. Good posture and healthy body mechanics can help to relieve stress in your body's tissues and joints. Good posture means that your spine is in its natural S-curve position (your spine is neutral), your shoulders are pulled back slightly, and your head is not tipped forward. The following are general guidelines for applying improved posture and body mechanics to your everyday activities. Sitting  1. When sitting, keep  your spine neutral and keep your feet flat on the floor. Use a footrest, if necessary, and keep your thighs parallel to the floor. Avoid rounding your shoulders, and avoid tilting your head forward. 2. When working at a desk or a computer, keep your desk at a height where your hands are slightly lower than your elbows. Slide your chair under your desk so you are close enough to maintain good posture. 3. When working at a computer, place your monitor at a height where you are looking straight ahead and you do not have to tilt your head forward or downward to look at the screen. Standing   When standing, keep your spine neutral and keep your feet about hip-width apart. Keep a slight bend in your knees. Your ears, shoulders, and hips should line up.  When you do a task in which you stand in one place for a long time, place one foot up on a stable object that is 2-4 inches (5-10 cm) high, such as a footstool. This helps keep your spine neutral. Resting When lying down and resting, avoid positions that are most painful for you. Try to support your neck in a neutral position. You can use a contour pillow or a small rolled-up towel. Your pillow should support your neck but not push on it. This information is not intended to replace advice given to you by your health care provider. Make sure you discuss any questions you have with your health care provider. Document Revised: 09/27/2018 Document Reviewed: 03/08/2018 Elsevier Patient Education  [place].

## 2019-11-15 LAB — IFE INTERPRETATION

## 2019-11-15 LAB — PROTEIN ELECTROPHORESIS, SERUM, WITH REFLEX
Albumin ELP: 4 g/dL (ref 3.8–4.8)
Alpha 1: 0.3 g/dL (ref 0.2–0.3)
Alpha 2: 0.7 g/dL (ref 0.5–0.9)
Beta 2: 0.4 g/dL (ref 0.2–0.5)
Beta Globulin: 0.4 g/dL (ref 0.4–0.6)
Gamma Globulin: 0.7 g/dL — ABNORMAL LOW (ref 0.8–1.7)
Total Protein: 6.5 g/dL (ref 6.1–8.1)

## 2019-11-15 LAB — PARATHYROID HORMONE, INTACT (NO CA): PTH: 37 pg/mL (ref 14–64)

## 2019-11-15 NOTE — Progress Notes (Signed)
Abnormal IFE noted.  Please refer to hematology for evaluation.

## 2019-11-16 ENCOUNTER — Telehealth: Payer: Self-pay | Admitting: *Deleted

## 2019-11-16 DIAGNOSIS — R778 Other specified abnormalities of plasma proteins: Secondary | ICD-10-CM

## 2019-11-16 NOTE — Telephone Encounter (Signed)
-----   Message from Bo Merino, MD sent at 11/15/2019  5:15 PM EDT ----- Abnormal IFE noted.  Please refer to hematology for evaluation.

## 2019-11-22 ENCOUNTER — Telehealth: Payer: Self-pay | Admitting: Hematology and Oncology

## 2019-11-22 NOTE — Telephone Encounter (Signed)
Received a new hem referral from Dr. Estanislado Pandy for abnl spep. Jean [name] has been cld and scheduled to see Dr. Alvy Bimler on 7/6 at 1pm. Pt aware to arrive 15 minutes early.

## 2019-12-12 ENCOUNTER — Other Ambulatory Visit: Payer: Self-pay | Admitting: *Deleted

## 2019-12-12 NOTE — Telephone Encounter (Signed)
Refill request received via fax  Patient is not currently on MTX. Will not need refill on Folic Acid at this time.

## 2019-12-21 ENCOUNTER — Other Ambulatory Visit: Payer: Self-pay | Admitting: Hematology and Oncology

## 2019-12-21 DIAGNOSIS — C320 Malignant neoplasm of glottis: Secondary | ICD-10-CM

## 2019-12-21 DIAGNOSIS — R5383 Other fatigue: Secondary | ICD-10-CM

## 2019-12-25 ENCOUNTER — Other Ambulatory Visit: Payer: Self-pay

## 2019-12-25 ENCOUNTER — Other Ambulatory Visit: Payer: Self-pay | Admitting: Hematology and Oncology

## 2019-12-25 ENCOUNTER — Other Ambulatory Visit: Payer: Self-pay | Admitting: Rheumatology

## 2019-12-25 ENCOUNTER — Inpatient Hospital Stay: Payer: Medicare Other | Attending: Hematology | Admitting: Hematology and Oncology

## 2019-12-25 ENCOUNTER — Encounter: Payer: Self-pay | Admitting: Hematology and Oncology

## 2019-12-25 VITALS — BP 131/66 | HR 97 | Temp 98.4°F | Resp 18 | Ht 63.0 in | Wt 113.0 lb

## 2019-12-25 DIAGNOSIS — Z87891 Personal history of nicotine dependence: Secondary | ICD-10-CM | POA: Insufficient documentation

## 2019-12-25 DIAGNOSIS — C320 Malignant neoplasm of glottis: Secondary | ICD-10-CM | POA: Diagnosis present

## 2019-12-25 DIAGNOSIS — R1310 Dysphagia, unspecified: Secondary | ICD-10-CM | POA: Diagnosis not present

## 2019-12-25 DIAGNOSIS — E039 Hypothyroidism, unspecified: Secondary | ICD-10-CM | POA: Diagnosis not present

## 2019-12-25 DIAGNOSIS — D75839 Thrombocytosis, unspecified: Secondary | ICD-10-CM

## 2019-12-25 DIAGNOSIS — R779 Abnormality of plasma protein, unspecified: Secondary | ICD-10-CM | POA: Insufficient documentation

## 2019-12-25 DIAGNOSIS — R682 Dry mouth, unspecified: Secondary | ICD-10-CM | POA: Diagnosis not present

## 2019-12-25 DIAGNOSIS — D472 Monoclonal gammopathy: Secondary | ICD-10-CM

## 2019-12-25 DIAGNOSIS — Z9221 Personal history of antineoplastic chemotherapy: Secondary | ICD-10-CM | POA: Diagnosis not present

## 2019-12-25 DIAGNOSIS — Z923 Personal history of irradiation: Secondary | ICD-10-CM | POA: Insufficient documentation

## 2019-12-25 MED ORDER — FOLIC ACID 1 MG PO TABS: ORAL_TABLET | ORAL | 3 refills | Status: DC

## 2019-12-25 NOTE — Assessment & Plan Note (Signed)
She was recently found to have possibility of abnormal M spike The results are nonspecific I recommend repeat blood work again in 6 months, to be done a week ahead of time to confirm or refute whether she had MGUS At present time, with normal recent serum chemistries and borderline anemia, I do not feel strongly we need to order this blood test today We discussed the natural history of MGUS She is reassured

## 2019-12-25 NOTE — Assessment & Plan Note (Signed)
We discussed the importance of close TSH monitoring

## 2019-12-25 NOTE — Telephone Encounter (Signed)
Patient called requesting prescription refill of Folic Acid to be sent to [facility] at [facility].

## 2019-12-25 NOTE — Assessment & Plan Note (Addendum)
She has no signs or symptoms of cancer recurrence We discussed the importance of ENT follow-up for surveillance I will see her again in about 4 months for further follow-up We discussed the importance of TSH monitoring due to radiation exposure

## 2019-12-25 NOTE — Progress Notes (Signed)
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 female was transferred to my care after her prior physician has left.  She is also being referred here by rheumatologist due to possible abnormal MGUS detected on her blood work I reviewed the patient's records extensive and collaborated the history with the patient. 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 VOCAL CORD MASS, LEFT, EXCISION:  - Squamous cell carcinoma.  - See comment.    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 Left glottic lesion is hypermetabolic with maximum SUV 8.9, compatible with malignancy. No discrete adenopathy in the neck. 2. Extensive periarticular activity especially around the large joints, likely related to rheumatoid arthritis. 3. Small but hypermetabolic axillary and left external iliac lymph nodes are most likely reactive to the patient's rheumatoid arthritis, but merit surveillance. 4. Mild right hydronephrosis without hydroureter, query right UPJ narrowing. Consider follow renal sonography. 5. Prominent but not hypermetabolic left adnexa, possibly due to prominent ovary or left eccentricity of the uterus, correlate with surgical history. This could be further investigated with pelvic sonography if clinically warranted. 6. Other  imaging findings of potential clinical significance: Chronic left maxillary sinusitis. Aortic Atherosclerosis (ICD10-I70.0). Coronary atherosclerosis. Emphysema (ICD10-J43.9). Sigmoid colon diverticulosis.   04/10/2019 Imaging   CT neck w/ contrast:  1. Left glottic mass extending to but not beyond the anterior commissure. There is extension into the left paraglottic fat without cartilage erosion. 2. No adenopathy. 3. Aortic Atherosclerosis (ICD10-I70.0) and Emphysema (ICD10-J43.9).   04/11/2019 Cancer Staging   Staging form: Larynx - Glottis, AJCC 8th Edition - Clinical stage from 04/11/2019: Stage III (cT3, cN0, cM0) - Signed by Eppie Gibson, MD on 04/11/2019   05/03/2019 - 06/29/2019 Chemotherapy   The patient had cisplatin for chemotherapy treatment.     10/08/2019 PET scan   1. Complete metabolic response to therapy of left-sided glottic primary. 2. Suboptimal evaluation for cervical nodal disease secondary to extensive hypermetabolic "brown" fat within the neck and chest. Given this limitation, no evidence of metastatic disease. 3. Decreased size of hypermetabolic mediastinal node, likely reactive. 4. Aortic atherosclerosis (ICD10-I70.0), coronary artery atherosclerosis and emphysema (ICD10-J43.9). 5. Similar appearance of the right kidney, possibly related to chronic ureteropelvic junction obstruction versus pyelonephritis. 6. Left nephrolithiasis.   11/05/2019 Procedure   Successful right IJ vein Port-A-Cath explantation.   Successful removal of percutaneous gastrostomy tube    Since she has completed chemotherapy and radiation treatment, she has some mild chronic productive cough She denies hoarseness of her voice, which was the initial presentation of her vocal cord cancer She saw ENT around April or May of this year with negative laryngoscopy exam She has mild intermittent dysphagia especially when she tries to eat spicy food Denies recent choking She has some mild  restrictive movement on the left side of her neck occasionally She has chronic dry mouth but she attempted  drink plenty of fluid She had history of poor dentition before treatment and recently had dental extraction without problems Her Humira and methotrexate was placed on hold when she was diagnosed with cancer and she continues to do well so far She has close appointment to see rheumatologist. Recently, she had borderline abnormal serum protein electrophoresis and hence was referred here for further evaluation She denies significant joint pain No recent infection, fever or chills She is slowly gaining weight since discontinuation of chemotherapy and radiation  MEDICAL HISTORY:  Past Medical History:  Diagnosis Date  . Arthritis   . Cancer ([place])   . History of radiation therapy 05/03/19- 06/25/19   Larynx 35 fractions of 2 Gy each to total 70 Gy  . Hypertension   . Rheumatoid arthritis ([place])   . Vocal cord mass     SURGICAL HISTORY: Past Surgical History:  Procedure Laterality Date  . ECTOPIC PREGNANCY SURGERY    . EXTERNAL EAR SURGERY Right    removal of cyst or gland  . IR GASTROSTOMY TUBE MOD SED  04/26/2019  . IR GASTROSTOMY TUBE REMOVAL  11/05/2019  . IR IMAGING GUIDED PORT INSERTION  04/26/2019  . IR REMOVAL TUN ACCESS W/ PORT W/O FL MOD SED  11/05/2019  . MICROLARYNGOSCOPY Left 03/26/2019   Procedure: DIRECT MICROLARYNGOSCOPY WITH BIOPSY OF VOCAL CORD MASS;  Surgeon: Leta Baptist, MD;  Location: [facility];  Service: ENT;  Laterality: Left;    SOCIAL HISTORY: Social History   Socioeconomic History  . Marital status: Widowed    Spouse name: Not on file  . Number of children: 1  . Years of education: Not on file  . Highest education level: Not on file  Occupational History  . Not on file  Tobacco Use  . Smoking status: Former Smoker    Packs/day: 1.00    Years: 30.00    Pack years: 30.00    Types: Cigarettes    Quit date: 11/08/2003    Years since  quitting: 16.1  . Smokeless tobacco: Never Used  Vaping Use  . Vaping Use: Never used  Substance and Sexual Activity  . Alcohol use: Not Currently  . Drug use: No  . Sexual activity: Not on file  Other Topics Concern  . Not on file  Social History Narrative   Patient was recently widowed in March 2020.   Patient has 1 daughter who lives in [place], [place]   Patient moved to [place] from [place] in January 2009.   Patient is a retired Therapist, sports.   Social Determinants of Health   Financial Resource Strain:   . Difficulty of Paying Living Expenses:   Food Insecurity:   . Worried About Charity fundraiser in the Last Year:   . Arboriculturist in the Last Year:   Transportation Needs: No Transportation Needs  . Lack of Transportation (Medical): No  . Lack of Transportation (Non-Medical): No  Physical Activity:   . Days of Exercise per Week:   . Minutes of Exercise per Session:   Stress:   . Feeling of Stress :   Social Connections:   . Frequency of Communication with Friends and Family:   . Frequency of Social Gatherings with Friends and Family:   . Attends Religious Services:   . Active Member of Clubs or Organizations:   . Attends Archivist Meetings:   Marland Kitchen Marital Status:   Intimate Partner Violence: Not At Risk  . Fear of Current  or Ex-Partner: No  . Emotionally Abused: No  . Physically Abused: No  . Sexually Abused: No    FAMILY HISTORY: Family History  Problem Relation Age of Onset  . Stroke Mother   . Hypertension Daughter     ALLERGIES:  is allergic to sulfa antibiotics.  MEDICATIONS:  Current Outpatient Medications  Medication Sig Dispense Refill  . alendronate (FOSAMAX) 70 MG tablet Take 1 tablet (70 mg total) by mouth once a week. Take with a full glass of water on an empty stomach. 12 tablet 1  . amLODipine (NORVASC) 5 MG tablet Take 5 mg by mouth daily.  0  . Cholecalciferol (VITAMIN D) 50 MCG (2000 UT) CAPS Take 2,000 Units by mouth  daily.     . folic acid (FOLVITE) 1 MG tablet TAKE 2 TABLETS(2 MG) BY MOUTH DAILY 180 tablet 3  . losartan (COZAAR) 100 MG tablet Take 100 mg by mouth daily.   0  . Red Yeast Rice 500 MG/0.5GM POWD Take 500 mg by mouth every other day.    . sodium fluoride (PREVIDENT 5000 PLUS) 1.1 % CREA dental cream Apply to tooth brush. Brush teeth for 2 minutes. Spit out excess-DO NOT swallow. DO NOT rinse afterwards. Repeat nightly. 1 Tube prn  . vitamin B-12 (CYANOCOBALAMIN) 1000 MCG tablet Take 1,000 mcg by mouth daily.     No current facility-administered medications for this visit.    REVIEW OF SYSTEMS:   Constitutional: Denies fevers, chills or abnormal night sweats Eyes: Denies blurriness of vision, double vision or watery eyes Ears, nose, mouth, throat, and face: Denies mucositis or sore throat Respiratory: Denies cough, dyspnea or wheezes Cardiovascular: Denies palpitation, chest discomfort or lower extremity swelling Gastrointestinal:  Denies nausea, heartburn or change in bowel habits Skin: Denies abnormal skin rashes Lymphatics: Denies new lymphadenopathy or easy bruising Neurological:Denies numbness, tingling or new weaknesses Behavioral/Psych: Mood is stable, no new changes  All other systems were reviewed with the patient and are negative.  PHYSICAL EXAMINATION: ECOG PERFORMANCE STATUS: 0 - Asymptomatic  Vitals:   12/25/19 1312  BP: 131/66  Pulse: 97  Resp: 18  Temp: 98.4 F (36.9 C)  SpO2: 100%   Filed Weights   12/25/19 1312  Weight: 113 lb (51.3 kg)    GENERAL:alert, no distress and comfortable SKIN: skin color, texture, turgor are normal, no rashes or significant lesions EYES: normal, conjunctiva are pink and non-injected, sclera clear OROPHARYNX:no exudate, normal lips, buccal mucosa, and tongue  NECK: supple, thyroid normal size, non-tender, without nodularity LYMPH:  no palpable lymphadenopathy in the cervical, axillary or inguinal LUNGS: clear to auscultation  and percussion with normal breathing effort HEART: regular rate & rhythm and no murmurs without lower extremity edema ABDOMEN:abdomen soft, non-tender and normal bowel sounds Musculoskeletal:no cyanosis of digits and no clubbing  PSYCH: alert & oriented x 3 with fluent speech NEURO: no focal motor/sensory deficits  LABORATORY DATA:  I have reviewed the data as listed Lab Results  Component Value Date   WBC 5.9 11/05/2019   HGB 11.6 (L) 11/05/2019   HCT 36.8 11/05/2019   MCV 100.3 (H) 11/05/2019   PLT 275 11/05/2019   Recent Labs    07/04/19 1542 07/26/19 1009 08/01/19 1525 08/01/19 1525 10/10/19 1402 10/31/19 1118 11/12/19 1017  NA 142   < > 141  --  143 141  --   K 4.1   < > 4.3  --  4.0 4.8  --   CL 107   < >  106  --  106 106  --   CO2 26   < > 25  --  26 27  --   GLUCOSE 114*   < > 81  --  85 86  --   BUN 21   < > 16  --  13 15  --   CREATININE 0.73   < > 0.75  --  0.91 0.84  --   CALCIUM 8.4*   < > 9.4  --  8.9 9.7  --   GFRNONAA >60   < > >60  --  >60 70  --   GFRAA >60   < > >60  --  >60 81  --   PROT 5.8*   < > 6.5   < > 6.6 6.3 6.5  ALBUMIN 3.2*  --  3.9  --  3.6  --   --   AST 13*   < > 13*  --  13* 13  --   ALT 15   < > 12  --  10 10  --   ALKPHOS 80  --  69  --  73  --   --   BILITOT <0.2*   < > <0.2*  --  <0.2* 0.3  --    < > = values in this interval not displayed.    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 [facility]) She has no signs or symptoms of cancer recurrence We discussed the importance of ENT follow-up for surveillance I will see her again in about 4 months for further follow-up We discussed the importance of TSH monitoring due to radiation exposure  Acquired hypothyroidism We discussed the importance of close TSH monitoring  MGUS (monoclonal gammopathy of unknown significance) She was recently found to have possibility of abnormal M spike The results are nonspecific I recommend repeat blood work again in 6 months, to  be done a week ahead of time to confirm or refute whether she had MGUS At present time, with normal recent serum chemistries and borderline anemia, I do not feel strongly we need to order this blood test today We discussed the natural history of MGUS She is reassured   Orders Placed This Encounter  Procedures  . Comprehensive metabolic panel    Standing Status:   Standing    Number of Occurrences:   33    Standing Expiration Date:   12/24/2020  . CBC with Differential/Platelet    Standing Status:   Standing    Number of Occurrences:   22    Standing Expiration Date:   12/24/2020  . TSH    Standing Status:   Standing    Number of Occurrences:   22    Standing Expiration Date:   12/24/2020  . Kappa/lambda light chains    Standing Status:   Standing    Number of Occurrences:   22    Standing Expiration Date:   12/24/2020  . Multiple Myeloma Panel (SPEP&IFE w/QIG)    Standing Status:   Standing    Number of Occurrences:   22    Standing Expiration Date:   12/24/2020    All questions were answered. The patient knows to call the clinic with any problems, questions or concerns. The total time spent in the appointment was 45 minutes encounter with patients including review of chart and various tests results, discussions about plan of care and coordination of care plan   [name], MD 12/25/2019 1:39 PM

## 2019-12-25 NOTE — Telephone Encounter (Signed)
Last Visit: 11/12/2019 Next Visit: 04/02/2020  Patient D/c inj MTX prior to starting rad therapy in Oct 2020.   Patient requesting refill on Folic Acid. Okay to refill Folic Acid?

## 2020-01-10 ENCOUNTER — Encounter: Payer: Medicare Other | Admitting: Medical

## 2020-03-19 NOTE — Progress Notes (Signed)
Office Visit Note  Patient: Jean [name]             Date of Birth: December 11, 1945           MRN: [id number]             PCP: [name], MD Referring: [name], MD Visit Date: 04/02/2020 Occupation: @GUAROCC @  Subjective:  Medication monitoring.   History of Present Illness: Jean [name] is a 74 y.o. female with history of seronegative rheumatoid arthritis, osteoarthritis and osteoporosis.  She has been off methotrexate since November 2020.  She started chemotherapy after that for the carcinoma vocal cords.  She has had no recurrence of rheumatoid arthritis.  She continues to have some stiffness from osteoarthritis.  She has been taking Fosamax for osteoporosis.  Her last DEXA scan was Nov 02, 2019 which showed a T score of -3.4 in the lumbar region.  She has been tolerating Fosamax well.  Also takes calcium and vitamin D.  Activities of Daily Living:  Patient reports morning stiffness for 10  minutes.   Patient Denies nocturnal pain.  Difficulty dressing/grooming: Denies Difficulty climbing stairs: Denies Difficulty getting out of chair: Denies Difficulty using hands for taps, buttons, cutlery, and/or writing: Denies  Review of Systems  Constitutional: Negative for fatigue.  HENT: Negative for mouth sores, mouth dryness and nose dryness.   Eyes: Negative for pain, itching and dryness.  Respiratory: Negative for shortness of breath and difficulty breathing.   Cardiovascular: Negative for chest pain and palpitations.  Gastrointestinal: Negative for blood in stool, constipation and diarrhea.  Endocrine: Negative for increased urination.  Genitourinary: Negative for difficulty urinating and painful urination.  Musculoskeletal: Positive for arthralgias, joint pain and morning stiffness. Negative for joint swelling, myalgias, muscle tenderness and myalgias.  Skin: Negative for color change, rash and redness.  Allergic/Immunologic: Negative for susceptible to  infections.  Neurological: Negative for dizziness, numbness, headaches, memory loss and weakness.  Hematological: Positive for bruising/bleeding tendency.  Psychiatric/Behavioral: Negative for confusion and sleep disturbance. The patient is not nervous/anxious.     PMFS History:  Patient Active Problem List   Diagnosis Date Noted  . MGUS (monoclonal gammopathy of unknown significance) 12/25/2019  . Acquired hypothyroidism 12/25/2019  . Protein malnutrition (HCC) 05/16/2019  . Constipation due to opioid therapy 05/09/2019  . Port-A-Cath in place 05/02/2019  . 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 Vitamin D deficiency 01/23/2017  . Rheumatoid arthritis, seronegative, multiple sites (HCC) 01/19/2017  . High risk medication use 01/19/2017  . Primary osteoarthritis of both hands 01/19/2017  . Primary osteoarthritis of both knees 01/19/2017  . Fatigue 01/19/2017  . Thrombocytosis 01/19/2017  . Primary osteoarthritis of both feet 01/19/2017  . Macrocytic anemia 01/19/2017  . Screening-pulmonary TB 04/26/2016    Past Medical History:  Diagnosis Date  . Arthritis   . Cancer (HCC)   . History of radiation therapy 05/03/19- 06/25/19   Larynx 35 fractions of 2 Gy each to total 70 Gy  . Hypertension   . Rheumatoid arthritis (HCC)   . Vocal cord mass     Family History  Problem Relation Age of Onset  . Stroke Mother   . Hypertension Daughter    Past Surgical History:  Procedure Laterality Date  . ECTOPIC PREGNANCY SURGERY    . EXTERNAL EAR SURGERY Right    removal of cyst or gland  . IR GASTROSTOMY TUBE MOD SED  04/26/2019  . IR GASTROSTOMY TUBE REMOVAL  11/05/2019  . IR IMAGING GUIDED PORT INSERTION  04/26/2019  . IR REMOVAL TUN ACCESS W/ PORT W/O FL MOD SED  11/05/2019  . MICROLARYNGOSCOPY Left 03/26/2019   Procedure: DIRECT MICROLARYNGOSCOPY WITH BIOPSY OF VOCAL CORD MASS;  Surgeon: Newman Pies, MD;  Location: [facility];  Service: ENT;   Laterality: Left;   Social History   Social History Narrative   Patient was recently widowed in March 2020.   Patient has 1 daughter who lives in [place]   Patient moved to [place] from [place] in January 2009.   Patient is a retired Char[employer] fundraiser.   Immunization History  Administered Date(s) Administered  . Influenza, High Dose Seasonal PF 06/09/2017  . PFIZER SARS-COV-2 Vaccination 08/29/2019, 09/19/2019     Objective: Vital Signs: BP 136/87 (BP Location: Right Arm, Patient Position: Sitting, Cuff Size: Normal)   Pulse 79   Resp 13   Ht 5\' 3"  (1.6 m)   Wt 117 lb 9.6 oz (53.3 kg)   BMI 20.83 kg/m    Physical Exam Vitals and nursing note reviewed.  Constitutional:      Appearance: She is well-developed.  HENT:     Head: Normocephalic and atraumatic.  Eyes:     Conjunctiva/sclera: Conjunctivae normal.  Cardiovascular:     Rate and Rhythm: Normal rate and regular rhythm.     Heart sounds: Normal heart sounds.  Pulmonary:     Effort: Pulmonary effort is normal.     Breath sounds: Normal breath sounds.  Abdominal:     General: Bowel sounds are normal.     Palpations: Abdomen is soft.  Musculoskeletal:     Cervical back: Normal range of motion.  Lymphadenopathy:     Cervical: No cervical adenopathy.  Skin:    General: Skin is warm and dry.     Capillary Refill: Capillary refill takes less than 2 seconds.  Neurological:     Mental Status: She is alert and oriented to person, place, and time.  Psychiatric:        Behavior: Behavior normal.      Musculoskeletal Exam: C-spine thoracic and lumbar spine with good range of motion.  Shoulder joints, elbow joints, wrist joints, MCPs PIPs and DIPs with good range of motion with no synovitis.  Hip joints, knee joints, ankles, MTPs and PIPs with good range of motion with no synovitis.  CDAI Exam: CDAI Score: 0  Patient Global: 0 mm; Provider Global: 0 mm Swollen: 0 ; Tender: 0  Joint Exam 04/02/2020   No  joint exam has been documented for this visit   There is currently no information documented on the homunculus. Go to the Rheumatology activity and complete the homunculus joint exam.  Investigation: No additional findings.  Imaging: No results found.  Recent Labs: Lab Results  Component Value Date   WBC 4.9 03/31/2020   HGB 12.0 03/31/2020   PLT 348 03/31/2020   NA 140 03/31/2020   K 4.4 03/31/2020   CL 107 03/31/2020   CO2 28 03/31/2020   GLUCOSE 107 (H) 03/31/2020   BUN 12 03/31/2020   CREATININE 0.88 03/31/2020   BILITOT 0.3 03/31/2020   ALKPHOS 73 10/10/2019   AST 12 03/31/2020   ALT 8 03/31/2020   PROT 6.5 03/31/2020   ALBUMIN 3.6 10/10/2019   CALCIUM 9.4 03/31/2020   GFRAA 76 03/31/2020   QFTBGOLDPLUS NEGATIVE 01/15/2019    Speciality Comments: PPD- 07/08/17 Negative  Procedures:  No procedures performed Allergies: Sulfa antibiotics  Assessment / Plan:     Visit Diagnoses: Age-related osteoporosis without current pathological fracture - Fosamax 70 mg p.o. weekly.  Nov 02, 2019 DEXA showed T score of -3.4 in the lumbar region.  She has been tolerating Fosamax well.  She has been also taking calcium and vitamin D.  We will check DEXA scan in 2023.  Need for regular exercise and resistive exercises were discussed.  History of vitamin D deficiency-she is on vitamin D supplement.  Rheumatoid arthritis, seronegative, multiple sites (HCC) - in remission.  -since chemo/radiation for squamous cell carcinoma of L vocal cord.   High risk medication use - D/c inj MTX prior to starting rad therapy in Oct 2020. D/c Humira prior to chemo.  Primary osteoarthritis of both hands-she has some DIP and PIP prominence without much discomfort.  Primary osteoarthritis of both knees-she does not have any knee joint discomfort currently.  Primary osteoarthritis of both feet-she had no tenderness in her feet.  Post-menopausal  Squamous cell carcinoma of left vocal cord  (HCC)  Educated about COVID-19 virus infection-she is fully vaccinated against COVID-19.  She will be getting her booster today.  Instructions were placed in the AVS.  Use of mask, social distancing and hand hygiene was discussed.  I also discussed the use of monoclonal antibody infusion in case she develops COVID-19 infection.  Orders: No orders of the defined types were placed in this encounter.  No orders of the defined types were placed in this encounter.    Follow-Up Instructions: Return in about 6 months (around 10/01/2020) for Rheumatoid arthritis, Osteoarthritis, Osteoporosis.   Pollyann Savoy, MD  Note - This record has been created using Animal nutritionist.  Chart creation errors have been sought, but may not always  have been located. Such creation errors do not reflect on  the standard of medical care.

## 2020-03-31 ENCOUNTER — Other Ambulatory Visit: Payer: Self-pay | Admitting: *Deleted

## 2020-03-31 DIAGNOSIS — Z79899 Other long term (current) drug therapy: Secondary | ICD-10-CM

## 2020-04-01 LAB — CBC WITH DIFFERENTIAL/PLATELET
Absolute Monocytes: 358 cells/uL (ref 200–950)
Basophils Absolute: 20 cells/uL (ref 0–200)
Basophils Relative: 0.4 %
Eosinophils Absolute: 29 cells/uL (ref 15–500)
Eosinophils Relative: 0.6 %
HCT: 36.3 % (ref 35.0–45.0)
Hemoglobin: 12 g/dL (ref 11.7–15.5)
Lymphs Abs: 657 cells/uL — ABNORMAL LOW (ref 850–3900)
MCH: 32.2 pg (ref 27.0–33.0)
MCHC: 33.1 g/dL (ref 32.0–36.0)
MCV: 97.3 fL (ref 80.0–100.0)
MPV: 9.6 fL (ref 7.5–12.5)
Monocytes Relative: 7.3 %
Neutro Abs: 3837 cells/uL (ref 1500–7800)
Neutrophils Relative %: 78.3 %
Platelets: 348 10*3/uL (ref 140–400)
RBC: 3.73 10*6/uL — ABNORMAL LOW (ref 3.80–5.10)
RDW: 13.1 % (ref 11.0–15.0)
Total Lymphocyte: 13.4 %
WBC: 4.9 10*3/uL (ref 3.8–10.8)

## 2020-04-01 LAB — COMPLETE METABOLIC PANEL WITH GFR
AG Ratio: 1.7 (calc) (ref 1.0–2.5)
ALT: 8 U/L (ref 6–29)
AST: 12 U/L (ref 10–35)
Albumin: 4.1 g/dL (ref 3.6–5.1)
Alkaline phosphatase (APISO): 71 U/L (ref 37–153)
BUN: 12 mg/dL (ref 7–25)
CO2: 28 mmol/L (ref 20–32)
Calcium: 9.4 mg/dL (ref 8.6–10.4)
Chloride: 107 mmol/L (ref 98–110)
Creat: 0.88 mg/dL (ref 0.60–0.93)
GFR, Est African American: 76 mL/min/{1.73_m2} (ref >=60)
GFR, Est Non African American: 66 mL/min/{1.73_m2} (ref >=60)
Globulin: 2.4 g/dL (calc) (ref 1.9–3.7)
Glucose, Bld: 107 mg/dL — ABNORMAL HIGH (ref 65–99)
Potassium: 4.4 mmol/L (ref 3.5–5.3)
Sodium: 140 mmol/L (ref 135–146)
Total Bilirubin: 0.3 mg/dL (ref 0.2–1.2)
Total Protein: 6.5 g/dL (ref 6.1–8.1)

## 2020-04-01 NOTE — Progress Notes (Signed)
CBC and CMP are stable.

## 2020-04-02 ENCOUNTER — Other Ambulatory Visit: Payer: Self-pay

## 2020-04-02 ENCOUNTER — Encounter: Payer: Self-pay | Admitting: Rheumatology

## 2020-04-02 ENCOUNTER — Ambulatory Visit: Payer: Medicare Other | Admitting: Rheumatology

## 2020-04-02 VITALS — BP 136/87 | HR 79 | Resp 13 | Ht 63.0 in | Wt 117.6 lb

## 2020-04-02 DIAGNOSIS — Z8639 Personal history of other endocrine, nutritional and metabolic disease: Secondary | ICD-10-CM

## 2020-04-02 DIAGNOSIS — M19072 Primary osteoarthritis, left ankle and foot: Secondary | ICD-10-CM

## 2020-04-02 DIAGNOSIS — Z79899 Other long term (current) drug therapy: Secondary | ICD-10-CM | POA: Diagnosis not present

## 2020-04-02 DIAGNOSIS — C320 Malignant neoplasm of glottis: Secondary | ICD-10-CM

## 2020-04-02 DIAGNOSIS — Z7189 Other specified counseling: Secondary | ICD-10-CM

## 2020-04-02 DIAGNOSIS — M0609 Rheumatoid arthritis without rheumatoid factor, multiple sites: Secondary | ICD-10-CM | POA: Diagnosis not present

## 2020-04-02 DIAGNOSIS — M810 Age-related osteoporosis without current pathological fracture: Secondary | ICD-10-CM

## 2020-04-02 DIAGNOSIS — M170 Bilateral primary osteoarthritis of knee: Secondary | ICD-10-CM

## 2020-04-02 DIAGNOSIS — M19042 Primary osteoarthritis, left hand: Secondary | ICD-10-CM

## 2020-04-02 DIAGNOSIS — M19041 Primary osteoarthritis, right hand: Secondary | ICD-10-CM

## 2020-04-02 DIAGNOSIS — M19071 Primary osteoarthritis, right ankle and foot: Secondary | ICD-10-CM

## 2020-04-02 DIAGNOSIS — Z780 Asymptomatic menopausal state: Secondary | ICD-10-CM

## 2020-04-02 NOTE — Patient Instructions (Signed)
COVID-19 vaccine recommendations:   COVID-19 vaccine is recommended for everyone (unless you are allergic to a vaccine component), even if you are on a medication that suppresses your immune system.   Do not take Tylenol or any anti-inflammatory medications (NSAIDs) 24 hours prior to the COVID-19 vaccination.   There is no direct evidence about the efficacy of the COVID-19 vaccine in individuals who are on medications that suppress the immune system.   Even if you are fully vaccinated, and you are on any medications that suppress your immune system, please continue to wear a mask, maintain at least six feet social distance and practice hand hygiene.   If you develop a COVID-19 infection, please contact your PCP or our office to determine if you need antibody infusion.  The booster vaccine is now available for immunocompromised patients. It is advised that if you had Pfizer vaccine you should get Pfizer booster.  If you had a Moderna vaccine then you should get a Moderna booster. Johnson and Johnson does not have a booster vaccine at this time.  Please see the following web sites for updated information.   [email or web address]    

## 2020-04-08 ENCOUNTER — Other Ambulatory Visit: Payer: Self-pay | Admitting: Rheumatology

## 2020-04-08 NOTE — Telephone Encounter (Signed)
Last Visit: 04/02/2020 Next Visit: 10/01/2020 Labs: 03/31/2020 CBC and CMP are stable.  Current Dose per office note on 04/02/2020: Fosamax 70 mg p.o. weekly.  Dx: Age-related osteoporosis without current pathological fracture   Okay to refill per Dr. Estanislado Pandy

## 2020-04-18 NOTE — Progress Notes (Signed)
Office Visit Note  Patient: Jean [name]             Date of Birth: 11/13/1945           MRN: [id number]             PCP: [name], MD Referring: [name], MD Visit Date: 04/21/2020 Occupation: @GUAROCC @  Subjective:  Pain in multiple joints   History of Present Illness: Jean [name] is a 74 y.o. female with history of seronegative rheumatoid arthritis, osteoarthritis, and osteoporosis.  Patient has been off of Humira and methotrexate since October 2020 [name] to initiating chemotherapy and radiation therapy.  Her rheumatoid arthritis was in remission while undergoing treatment for squamous cell carcinoma of the left vocal cord.  She started having increased pain, stiffness, and swelling in multiple joints about 2 weeks ago.  She was unable to identify a trigger.  She is currently having pain in both shoulder joints, both elbow joints, both hands, and both knee joints.  She is unable to fully straighten her right elbow.  She has noticed intermittent swelling in both hands and both knees.  She has been experiencing nocturnal pain in both shoulder joints and both knees.  She is also noticed increased stiffness in her neck.  She has been taking ibuprofen 600 mg twice daily as needed for pain relief. She denies any recent infections. She continues to take Fosamax 70 mg 1 tablet by mouth once weekly.    Activities of Daily Living:  Patient reports morning stiffness for all day.  Patient Reports nocturnal pain.  Difficulty dressing/grooming: Reports Difficulty climbing stairs: Reports Difficulty getting out of chair: Reports Difficulty using hands for taps, buttons, cutlery, and/or writing: Reports  Review of Systems  Constitutional: Positive for fatigue.  HENT: Negative for mouth sores, mouth dryness and nose dryness.   Eyes: Negative for pain, itching and dryness.  Respiratory: Negative for shortness of breath and difficulty breathing.   Cardiovascular:  Negative for chest pain and palpitations.  Gastrointestinal: Positive for constipation. Negative for blood in stool and diarrhea.  Endocrine: Negative for increased urination.  Genitourinary: Negative for difficulty urinating.  Musculoskeletal: Positive for arthralgias, joint pain, joint swelling, myalgias, morning stiffness, muscle tenderness and myalgias.  Skin: Negative for color change, rash and redness.  Allergic/Immunologic: Negative for susceptible to infections.  Neurological: Positive for weakness. Negative for dizziness, numbness, headaches and memory loss.  Hematological: Negative for bruising/bleeding tendency.  Psychiatric/Behavioral: Negative for confusion.    PMFS History:  Patient Active Problem List   Diagnosis Date Noted  . MGUS (monoclonal gammopathy of unknown significance) 12/25/2019  . Acquired hypothyroidism 12/25/2019  . Protein malnutrition (HCC) 05/16/2019  . Constipation due to opioid therapy 05/09/2019  . Port-A-Cath in place 05/02/2019  . 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 Vitamin D deficiency 01/23/2017  . Rheumatoid arthritis, seronegative, multiple sites (HCC) 01/19/2017  . High risk medication use 01/19/2017  . Primary osteoarthritis of both hands 01/19/2017  . Primary osteoarthritis of both knees 01/19/2017  . Fatigue 01/19/2017  . Thrombocytosis 01/19/2017  . Primary osteoarthritis of both feet 01/19/2017  . Macrocytic anemia 01/19/2017  . Screening-pulmonary TB 04/26/2016    Past Medical History:  Diagnosis Date  . Arthritis   . Cancer (HCC)   . History of radiation therapy 05/03/19- 06/25/19   Larynx 35 fractions of 2 Gy each to total 70 Gy  . Hypertension   . Rheumatoid arthritis (HCC)   .  Vocal cord mass     Family History  Problem Relation Age of Onset  . Stroke Mother   . Hypertension Daughter    Past Surgical History:  Procedure Laterality Date  . ECTOPIC PREGNANCY SURGERY    . EXTERNAL EAR SURGERY  Right    removal of cyst or gland  . IR GASTROSTOMY TUBE MOD SED  04/26/2019  . IR GASTROSTOMY TUBE REMOVAL  11/05/2019  . IR IMAGING GUIDED PORT INSERTION  04/26/2019  . IR REMOVAL TUN ACCESS W/ PORT W/O FL MOD SED  11/05/2019  . MICROLARYNGOSCOPY Left 03/26/2019   Procedure: DIRECT MICROLARYNGOSCOPY WITH BIOPSY OF VOCAL CORD MASS;  Surgeon: Newman Pies, MD;  Location: [facility];  Service: ENT;  Laterality: Left;   Social History   Social History Narrative   Patient was recently widowed in March 2020.   Patient has 1 daughter who lives in [place], [place]   Patient moved to [place] from [place] in January 2009.   Patient is a retired Charity fundraiser.   Immunization History  Administered Date(s) Administered  . Influenza, High Dose Seasonal PF 06/09/2017  . PFIZER SARS-COV-2 Vaccination 08/29/2019, 09/19/2019     Objective: Vital Signs: BP 138/82 (BP Location: Right Arm, Patient Position: Sitting, Cuff Size: Normal)   Pulse 93   Resp 13   Ht 5\' 3"  (1.6 m)   Wt 116 lb 12.8 oz (53 kg)   BMI 20.69 kg/m    Physical Exam Vitals and nursing note reviewed.  Constitutional:      Appearance: She is well-developed.  HENT:     Head: Normocephalic and atraumatic.  Eyes:     Conjunctiva/sclera: Conjunctivae normal.  Pulmonary:     Effort: Pulmonary effort is normal.  Abdominal:     Palpations: Abdomen is soft.  Musculoskeletal:     Cervical back: Normal range of motion.  Skin:    General: Skin is warm and dry.     Capillary Refill: Capillary refill takes less than 2 seconds.  Neurological:     Mental Status: She is alert and oriented to person, place, and time.  Psychiatric:        Behavior: Behavior normal.      Musculoskeletal Exam: C-spine has painful limited range of motion with lateral rotation bilaterally.  Thoracic and lumbar spine have good range of motion.  No midline spinal tenderness.  Painful range of motion of both shoulder joints.  She has tenderness  palpation over both shoulders.  Tenderness and synovitis of both elbows noted.  Right elbow joint contracture noted.  Wrist joints have good range of motion with some tenderness in the right wrist.  She has tenderness and synovitis of the right second PIP joint.  She has complete fist formation bilaterally.  Knee joints have good range of motion with no warmth or effusion.  Ankle joints have good range of motion with no tenderness or inflammation.  No tenderness of MTP joints.  CDAI Exam: CDAI Score: 11.4  Patient Global: 8 mm; Provider Global: 6 mm Swollen: 3 ; Tender: 7  Joint Exam 04/21/2020      Right  Left  Glenohumeral   Tender   Tender  Elbow  Swollen Tender  Swollen Tender  PIP 2  Swollen Tender     Knee   Tender   Tender     Investigation: No additional findings.  Imaging: No results found.  Recent Labs: Lab Results  Component Value Date   WBC 4.9 03/31/2020  HGB 12.0 03/31/2020   PLT 348 03/31/2020   NA 140 03/31/2020   K 4.4 03/31/2020   CL 107 03/31/2020   CO2 28 03/31/2020   GLUCOSE 107 (H) 03/31/2020   BUN 12 03/31/2020   CREATININE 0.88 03/31/2020   BILITOT 0.3 03/31/2020   ALKPHOS 73 10/10/2019   AST 12 03/31/2020   ALT 8 03/31/2020   PROT 6.5 03/31/2020   ALBUMIN 3.6 10/10/2019   CALCIUM 9.4 03/31/2020   GFRAA 76 03/31/2020   QFTBGOLDPLUS NEGATIVE 01/15/2019    Speciality Comments: PPD- 07/08/17 Negative  Procedures:  No procedures performed Allergies: Sulfa antibiotics   Assessment / Plan:     Visit Diagnoses: Rheumatoid arthritis, seronegative, multiple sites (HCC) - She presents today with joint tenderness and synovitis of multiple joints as described above.  She has tenderness inflammation of both elbow joints.  Flexion contracture of the right elbow joint was noted.  She has been having rheumatoid arthritis flare for the past 2 weeks.  She is currently having pain in both shoulder joints, both elbows, both hands, and both knee joints.  Has  been experiencing worsening morning stiffness as well as nocturnal pain.  She has been taking ibuprofen 600 mg twice daily as needed for pain relief.  She is not currently taking any immunosuppressive agents.  She was previously in remission since undergoing chemo/radiation for squamous cell carcinoma of left vocal cord in November 2020.  [name] to starting radiation therapy she was on injectable methotrexate and Humira.  We discussed restarting on methotrexate to manage her rheumatoid arthritis. Indications, contraindications, potential side effects of methotrexate were discussed today.  She would rather take oral methotrexate over injectable methotrexate at this time.  She will start on methotrexate 8 tablets by mouth once weekly and folic acid 2 mg by mouth daily.  She will return for lab work in 2 weeks x 2 then every 3 months.  Standing orders for CBC and CMP remain in place.  A prednisone taper starting at 20 mg tapering by 5 mg every 4 days will be sent to the pharmacy.  I discussed the situation with Dr. Corliss Skains today and she agrees that the patient does not require more aggressive immunosuppression at this time.  She will follow-up in the office in 8 weeks and will reassess her response to oral methotrexate at that time.  D[facility] Counseling TB Gold: negative on 01/15/19 Hepatitis panel: negative on 11/30/13 HIV negative, immunoglobulins WNL on 11/30/13  Chest-xray:  No acute cardiopulmonary disease on 11/30/13   Contraception: She is postmenopausal   Alcohol use: She does not drink alcohol.   Patient was counseled on the purpose, proper use, and adverse effects of methotrexate including nausea, infection, and signs and symptoms of pneumonitis.  Reviewed instructions with patient to take methotrexate weekly along with folic acid daily.  Discussed the importance of frequent monitoring of kidney and liver function and blood counts, and provided patient with standing lab instructions.  Counseled patient  to avoid NSAIDs and alcohol while on methotrexate.  Provided patient with educational materials on methotrexate and answered all questions.  Advised patient to get annual influenza vaccine and to get a pneumococcal vaccine if patient has not already had one.  Patient voiced understanding.  Patient consented to methotrexate use.  Will upload into chart.    High risk medication use -She will be restarting methotrexate 8 tablets by mouth once weekly and folic acid 2 mg daily. She requested to start oral MTX instead of injectable  MTX at this time.  She was advised to notify us if she cannot tolerate oral MTX.  She was previously on injectable MTX and Humira but discontinued [name] to radiation and chemotherapy in November 2020.   She has completed chemo and radiation and has her routine yearly follow up with Dr. Bertis Ruddy on 05/20/20.   CBC and CMP were updated on 03/31/20 and were reviewed today in the office.  She will return for lab work in 2 weeks x2 then every 3 months. Standing orders for CBC and CMP are in place.  Age-related osteoporosis without current pathological fracture - She is taking Fosamax 70 mg p.o. weekly.  Nov 02, 2019 DEXA showed T score of -3.4 in the lumbar region.  She has been tolerating Fosamax and will continue taking vitamin D 2,000 units daily.   History of vitamin D deficiency: She is taking vitamin D 2,000 units daily.   Primary osteoarthritis of both hands: She has PIP and DIP thickening consistent with osteoarthritis of both hands.  She has joint tenderness and inflammation of the right second PIP joint on exam today. Joint protection and muscle strengthening were discussed.   Primary osteoarthritis of both knees: She presents today with increased pain in both knee joints.  She has been noticing intermittent swelling as well as increased stiffness especially after sitting for prolonged periods of time.  A prednisone taper was sent to the pharmacy.  Primary osteoarthritis of  both feet: She is not experiencing any discomfort in her feet at this time.  She has good ROM of both ankle joints with no discomfort, tenderness, or inflammation.   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  She has her yearly follow up scheduled with Dr. Bertis Ruddy on 05/20/20.   Abnormal SPEP: IFE poorly defined area of restricted protein mobility and is reactive with lambda light chain antisera.  She is followed by Dr. Bertis Ruddy.   Orders: No orders of the defined types were placed in this encounter.  Meds ordered this encounter  Medications  . predniSONE (DELTASONE) 5 MG tablet    Sig: Take 4 tablets by mouth daily x4 days, 3 tablets by mouth daily x4 days, 2 tablets by mouth daily x4 days, 1 tablet by mouth daily x4 days.    Dispense:  40 tablet    Refill:  0  . methotrexate (RHEUMATREX) 2.5 MG tablet    Sig: Take 8 tablets (20 mg total) by mouth once a week. Caution:Chemotherapy. Protect from light.    Dispense:  96 tablet    Refill:  0  . folic acid (FOLVITE) 1 MG tablet    Sig: Take 2 tablets (2 mg total) by mouth daily.    Dispense:  180 tablet    Refill:  3     Follow-Up Instructions: Return in 8 weeks (on 06/16/2020) for Rheumatoid arthritis, Osteoarthritis, Osteoporosis.   Gearldine Bienenstock, PA-C  Note - This record has been created using Dragon software.  Chart creation errors have been sought, but may not always  have been located. Such creation errors do not reflect on  the standard of medical care.

## 2020-04-21 ENCOUNTER — Other Ambulatory Visit: Payer: Self-pay

## 2020-04-21 ENCOUNTER — Ambulatory Visit: Payer: Medicare Other | Admitting: Physician Assistant

## 2020-04-21 ENCOUNTER — Encounter: Payer: Self-pay | Admitting: Physician Assistant

## 2020-04-21 VITALS — BP 138/82 | HR 93 | Resp 13 | Ht 63.0 in | Wt 116.8 lb

## 2020-04-21 DIAGNOSIS — R778 Other specified abnormalities of plasma proteins: Secondary | ICD-10-CM

## 2020-04-21 DIAGNOSIS — M19071 Primary osteoarthritis, right ankle and foot: Secondary | ICD-10-CM

## 2020-04-21 DIAGNOSIS — M0609 Rheumatoid arthritis without rheumatoid factor, multiple sites: Secondary | ICD-10-CM | POA: Diagnosis not present

## 2020-04-21 DIAGNOSIS — M170 Bilateral primary osteoarthritis of knee: Secondary | ICD-10-CM

## 2020-04-21 DIAGNOSIS — Z79899 Other long term (current) drug therapy: Secondary | ICD-10-CM | POA: Diagnosis not present

## 2020-04-21 DIAGNOSIS — Z8639 Personal history of other endocrine, nutritional and metabolic disease: Secondary | ICD-10-CM | POA: Diagnosis not present

## 2020-04-21 DIAGNOSIS — M19072 Primary osteoarthritis, left ankle and foot: Secondary | ICD-10-CM

## 2020-04-21 DIAGNOSIS — M19042 Primary osteoarthritis, left hand: Secondary | ICD-10-CM

## 2020-04-21 DIAGNOSIS — M19041 Primary osteoarthritis, right hand: Secondary | ICD-10-CM

## 2020-04-21 DIAGNOSIS — M810 Age-related osteoporosis without current pathological fracture: Secondary | ICD-10-CM | POA: Diagnosis not present

## 2020-04-21 DIAGNOSIS — C320 Malignant neoplasm of glottis: Secondary | ICD-10-CM

## 2020-04-21 DIAGNOSIS — Z780 Asymptomatic menopausal state: Secondary | ICD-10-CM

## 2020-04-21 MED ORDER — PREDNISONE 5 MG PO TABS
ORAL_TABLET | ORAL | 0 refills | Status: DC
Start: 2020-04-21 — End: 2020-05-13

## 2020-04-21 MED ORDER — METHOTREXATE 2.5 MG PO TABS
20.0000 mg | ORAL_TABLET | ORAL | 0 refills | Status: DC
Start: 2020-04-21 — End: 2020-05-13

## 2020-04-21 MED ORDER — FOLIC ACID 1 MG PO TABS: 2.0000 mg | ORAL_TABLET | Freq: Every day | ORAL | 3 refills | Status: DC

## 2020-04-21 NOTE — Patient Instructions (Signed)
Standing Labs We placed an order today for your standing lab work.   Please have your standing labs drawn in 2 weeks x2 then every 3 months   If possible, please have your labs drawn 2 weeks prior to your appointment so that the provider can discuss your results at your appointment.  We have open lab daily Monday through Thursday from 8:30-12:30 PM and 1:30-4:30 PM and Friday from 8:30-12:30 PM and 1:30-4:00 PM at the office of Dr. Bo Merino, [facility].   Please be advised, patients with office appointments requiring lab work will take precedents over walk-in lab work.  If possible, please come for your lab work on Monday and Friday afternoons, as you may experience shorter wait times. The office is located at [address], [place], [place], [place] [address] No appointment is necessary.   Labs are drawn by [facility]. Please bring your co-pay at the time of your lab draw.  You may receive a bill from [facility] for your lab work.  If you wish to have your labs drawn at another location, please call the office 24 hours in advance to send orders.  If you have any questions regarding directions or hours of operation,  please call [phone].   As a reminder, please drink plenty of water prior to coming for your lab work. Thanks!    Methotrexate tablets What is this medicine? METHOTREXATE (METH oh TREX ate) is a chemotherapy drug used to treat cancer including breast cancer, leukemia, and lymphoma. This medicine can also be used to treat psoriasis and certain kinds of arthritis. This medicine may be used for other purposes; ask your health care provider or pharmacist if you have questions. COMMON BRAND NAME(S): Rheumatrex, Trexall What should I tell my health care provider before I take this medicine? They need to know if you have any of these conditions:  fluid in the stomach area or lungs  if you often drink alcohol  infection or immune system problems  kidney  disease or on hemodialysis  liver disease  low blood counts, like low white cell, platelet, or red cell counts  lung disease  radiation therapy  stomach ulcers  ulcerative colitis  an unusual or allergic reaction to methotrexate, other medicines, foods, dyes, or preservatives  pregnant or trying to get pregnant  breast-feeding How should I use this medicine? Take this medicine by mouth with a glass of water. Follow the directions on the prescription label. Take your medicine at regular intervals. Do not take it more often than directed. Do not stop taking except on your doctor's advice. Make sure you know why you are taking this medicine and how often you should take it. If this medicine is used for a condition that is not cancer, like arthritis or psoriasis, it should be taken weekly, NOT daily. Taking this medicine more often than directed can cause serious side effects, even death. Talk to your healthcare provider about safe handling and disposal of this medicine. You may need to take special precautions. Talk to your pediatrician regarding the use of this medicine in children. While this drug may be prescribed for selected conditions, precautions do apply. Overdosage: If you think you have taken too much of this medicine contact a poison control center or emergency room at once. NOTE: This medicine is only for you. Do not share this medicine with others. What if I miss a dose? If you miss a dose, talk with your doctor or health care professional. Do not  take double or extra doses. What may interact with this medicine? This medicine may interact with the following medication:  acitretin  aspirin and aspirin-like medicines including salicylates  azathioprine  certain antibiotics like penicillins, tetracycline, and chloramphenicol  cyclosporine  gold  hydroxychloroquine  live virus vaccines  NSAIDs, medicines for pain and inflammation, like ibuprofen or  naproxen  other cytotoxic agents  penicillamine  phenylbutazone  phenytoin  probenecid  retinoids such as isotretinoin and tretinoin  steroid medicines like prednisone or cortisone  sulfonamides like sulfasalazine and trimethoprim/sulfamethoxazole  theophylline This list may not describe all possible interactions. Give your health care provider a list of all the medicines, herbs, non-prescription drugs, or dietary supplements you use. Also tell them if you smoke, drink alcohol, or use illegal drugs. Some items may interact with your medicine. What should I watch for while using this medicine? Avoid alcoholic drinks. This medicine can make you more sensitive to the sun. Keep out of the sun. If you cannot avoid being in the sun, wear protective clothing and use sunscreen. Do not use sun lamps or tanning beds/booths. You may need blood work done while you are taking this medicine. Call your doctor or health care professional for advice if you get a fever, chills or sore throat, or other symptoms of a cold or flu. Do not treat yourself. This drug decreases your body's ability to fight infections. Try to avoid being around people who are sick. This medicine may increase your risk to bruise or bleed. Call your doctor or health care professional if you notice any unusual bleeding. Check with your doctor or health care professional if you get an attack of severe diarrhea, nausea and vomiting, or if you sweat a lot. The loss of too much body fluid can make it dangerous for you to take this medicine. Talk to your doctor about your risk of cancer. You may be more at risk for certain types of cancers if you take this medicine. Both men and women must use effective birth control with this medicine. Do not become pregnant while taking this medicine or until at least 1 normal menstrual cycle has occurred after stopping it. Women should inform their doctor if they wish to become pregnant or think they  might be pregnant. Men should not father a child while taking this medicine and for 3 months after stopping it. There is a potential for serious side effects to an unborn child. Talk to your health care professional or pharmacist for more information. Do not breast-feed an infant while taking this medicine. What side effects may I notice from receiving this medicine? Side effects that you should report to your doctor or health care professional as soon as possible:  allergic reactions like skin rash, itching or hives, swelling of the face, lips, or tongue  breathing problems or shortness of breath  diarrhea  dry, nonproductive cough  low blood counts - this medicine may decrease the number of white blood cells, red blood cells and platelets. You may be at increased risk for infections and bleeding.  mouth sores  redness, blistering, peeling or loosening of the skin, including inside the mouth  signs of infection - fever or chills, cough, sore throat, pain or trouble passing urine  signs and symptoms of bleeding such as bloody or black, tarry stools; red or dark-brown urine; spitting up blood or brown material that looks like coffee grounds; red spots on the skin; unusual bruising or bleeding from the eye, gums, or nose  signs and symptoms of kidney injury like trouble passing urine or change in the amount of urine  signs and symptoms of liver injury like dark yellow or brown urine; general ill feeling or flu-like symptoms; light-colored stools; loss of appetite; nausea; right upper belly pain; unusually weak or tired; yellowing of the eyes or skin Side effects that usually do not require medical attention (report to your doctor or health care professional if they continue or are bothersome):  dizziness  hair loss  tiredness  upset stomach  vomiting This list may not describe all possible side effects. Call your doctor for medical advice about side effects. You may report side  effects to FDA at 1-800-FDA-1088. Where should I keep my medicine? Keep out of the reach of children. Store at room temperature between 20 and 25 degrees C (68 and 77 degrees F). Protect from light. Throw away any unused medicine after the expiration date. NOTE: This sheet is a summary. It may not cover all possible information. If you have questions about this medicine, talk to your doctor, pharmacist, or health care provider.  2020 Elsevier/Gold Standard (2017-01-27 13:38:43)

## 2020-05-02 ENCOUNTER — Telehealth: Payer: Self-pay | Admitting: Hematology

## 2020-05-02 NOTE — Telephone Encounter (Signed)
Released records to [facility] to [phone]   Release:  [phone]

## 2020-05-09 ENCOUNTER — Other Ambulatory Visit: Payer: Self-pay | Admitting: *Deleted

## 2020-05-09 DIAGNOSIS — Z79899 Other long term (current) drug therapy: Secondary | ICD-10-CM

## 2020-05-10 ENCOUNTER — Other Ambulatory Visit: Payer: Self-pay | Admitting: Physician Assistant

## 2020-05-10 LAB — CBC WITH DIFFERENTIAL/PLATELET
Absolute Monocytes: 429 cells/uL (ref 200–950)
Basophils Absolute: 20 cells/uL (ref 0–200)
Basophils Relative: 0.3 %
Eosinophils Absolute: 40 cells/uL (ref 15–500)
Eosinophils Relative: 0.6 %
HCT: 34.3 % — ABNORMAL LOW (ref 35.0–45.0)
Hemoglobin: 11.5 g/dL — ABNORMAL LOW (ref 11.7–15.5)
Lymphs Abs: 766 cells/uL — ABNORMAL LOW (ref 850–3900)
MCH: 32.1 pg (ref 27.0–33.0)
MCHC: 33.5 g/dL (ref 32.0–36.0)
MCV: 95.8 fL (ref 80.0–100.0)
MPV: 9 fL (ref 7.5–12.5)
Monocytes Relative: 6.5 %
Neutro Abs: 5346 cells/uL (ref 1500–7800)
Neutrophils Relative %: 81 %
Platelets: 392 10*3/uL (ref 140–400)
RBC: 3.58 10*6/uL — ABNORMAL LOW (ref 3.80–5.10)
RDW: 13.2 % (ref 11.0–15.0)
Total Lymphocyte: 11.6 %
WBC: 6.6 10*3/uL (ref 3.8–10.8)

## 2020-05-10 LAB — COMPLETE METABOLIC PANEL WITH GFR
AG Ratio: 2 (calc) (ref 1.0–2.5)
ALT: 10 U/L (ref 6–29)
AST: 14 U/L (ref 10–35)
Albumin: 4.1 g/dL (ref 3.6–5.1)
Alkaline phosphatase (APISO): 66 U/L (ref 37–153)
BUN/Creatinine Ratio: 13 (calc) (ref 6–22)
BUN: 14 mg/dL (ref 7–25)
CO2: 25 mmol/L (ref 20–32)
Calcium: 9.3 mg/dL (ref 8.6–10.4)
Chloride: 106 mmol/L (ref 98–110)
Creat: 1.07 mg/dL — ABNORMAL HIGH (ref 0.60–0.93)
GFR, Est African American: 60 mL/min/{1.73_m2} (ref >=60)
GFR, Est Non African American: 52 mL/min/{1.73_m2} — ABNORMAL LOW (ref >=60)
Globulin: 2.1 g/dL (calc) (ref 1.9–3.7)
Glucose, Bld: 94 mg/dL (ref 65–139)
Potassium: 4.2 mmol/L (ref 3.5–5.3)
Sodium: 140 mmol/L (ref 135–146)
Total Bilirubin: 0.3 mg/dL (ref 0.2–1.2)
Total Protein: 6.2 g/dL (ref 6.1–8.1)

## 2020-05-12 NOTE — Progress Notes (Signed)
Office Visit Note  Patient: Jean [name]             Date of Birth: [birth year]           MRN: [id number]             PCP: [name], MD Referring: [name], MD Visit Date: 05/13/2020 Occupation: @GUAROCC @  Subjective:  Discuss medications   History of Present Illness: Jean [name] is a 74 y.o. female with history of seronegative rheumatoid arthritis.  She restarted on methotrexate 8 tablets by mouth once weekly 2 weeks ago but had an elevation in creatinine with recent lab work on 05/09/20.  She tolerated MTX without any side effects. She did not notice any clinical improvement starting on MTX.  She presents today to discuss other treatment options.  She is apprehensive to restart on humira at this time.  She continues to have pain, stiffness, and swelling in multiple joints including both shoulders, wrist joints, hands, and both knee joints.  Her morning stiffness has been lasting several hours.  She reports her joint pain and inflammation improved significantly while on the prednisone taper, which she has since completed. She has not been taking any NSAIDs or tylenol for pain relief recently.  She denies any recent infections.    Activities of Daily Living:  Patient reports morning stiffness for   several hours.   Patient Reports nocturnal pain.  Difficulty dressing/grooming: reports  Difficulty climbing stairs: Reports Difficulty getting out of chair: Reports Difficulty using hands for taps, buttons, cutlery, and/or writing: Reports  Review of Systems  Constitutional: Positive for fatigue.  HENT: Negative for mouth sores, mouth dryness and nose dryness.   Eyes: Negative for pain, visual disturbance and dryness.  Respiratory: Negative for cough, hemoptysis, shortness of breath and difficulty breathing.   Cardiovascular: Negative for chest pain, palpitations, hypertension and swelling in legs/feet.  Gastrointestinal: Negative for blood in stool,  constipation and diarrhea.  Endocrine: Negative for increased urination.  Genitourinary: Negative for painful urination.  Musculoskeletal: Positive for arthralgias, joint pain, joint swelling and morning stiffness. Negative for myalgias, muscle weakness, muscle tenderness and myalgias.  Skin: Negative for color change, pallor, rash, hair loss, nodules/bumps, skin tightness, ulcers and sensitivity to sunlight.  Allergic/Immunologic: Negative for susceptible to infections.  Neurological: Negative for dizziness, numbness, headaches and weakness.  Hematological: Negative for swollen glands.  Psychiatric/Behavioral: Negative for depressed mood and sleep disturbance. The patient is not nervous/anxious.     PMFS History:  Patient Active Problem List   Diagnosis Date Noted  . MGUS (monoclonal gammopathy of unknown significance) 12/25/2019  . Acquired hypothyroidism 12/25/2019  . Protein malnutrition (HCC) 05/16/2019  . Constipation due to opioid therapy 05/09/2019  . Port-A-Cath in place 05/02/2019  . 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 Vitamin D deficiency 01/23/2017  . Rheumatoid arthritis, seronegative, multiple sites (HCC) 01/19/2017  . High risk medication use 01/19/2017  . Primary osteoarthritis of both hands 01/19/2017  . Primary osteoarthritis of both knees 01/19/2017  . Fatigue 01/19/2017  . Thrombocytosis 01/19/2017  . Primary osteoarthritis of both feet 01/19/2017  . Macrocytic anemia 01/19/2017  . Screening-pulmonary TB 04/26/2016    Past Medical History:  Diagnosis Date  . Arthritis   . Cancer (HCC)   . History of radiation therapy 05/03/19- 06/25/19   Larynx 35 fractions of 2 Gy each to total 70 Gy  . Hypertension   . Rheumatoid arthritis (HCC)   . Vocal cord  mass     Family History  Problem Relation Age of Onset  . Stroke Mother   . Hypertension Daughter    Past Surgical History:  Procedure Laterality Date  . ECTOPIC PREGNANCY SURGERY      . EXTERNAL EAR SURGERY Right    removal of cyst or gland  . IR GASTROSTOMY TUBE MOD SED  04/26/2019  . IR GASTROSTOMY TUBE REMOVAL  11/05/2019  . IR IMAGING GUIDED PORT INSERTION  04/26/2019  . IR REMOVAL TUN ACCESS W/ PORT W/O FL MOD SED  11/05/2019  . MICROLARYNGOSCOPY Left 03/26/2019   Procedure: DIRECT MICROLARYNGOSCOPY WITH BIOPSY OF VOCAL CORD MASS;  Surgeon: Newman Pies, MD;  Location: [facility];  Service: ENT;  Laterality: Left;   Social History   Social History Narrative   Patient was recently widowed in March 2020.   Patient has 1 daughter who lives in [place]   Patient moved to [place] from [place] in January 2009.   Patient is a retired [employer].   Immunization History  Administered Date(s) Administered  . Influenza, High Dose Seasonal PF 06/09/2017  . PFIZER SARS-COV-2 Vaccination 08/29/2019, 09/19/2019     Objective: Vital Signs: BP 139/72 (BP Location: Right Arm, Patient Position: Sitting, Cuff Size: Normal)   Pulse (!) 111   Resp 15   Ht 5\' 3"  (1.6 m)   Wt 116 lb 6.4 oz (52.8 kg)   BMI 20.62 kg/m    Physical Exam Vitals and nursing note reviewed.  Constitutional:      Appearance: She is well-developed.  HENT:     Head: Normocephalic and atraumatic.  Eyes:     Conjunctiva/sclera: Conjunctivae normal.  Pulmonary:     Effort: Pulmonary effort is normal.  Abdominal:     Palpations: Abdomen is soft.  Musculoskeletal:     Cervical back: Normal range of motion.  Skin:    General: Skin is warm and dry.     Capillary Refill: Capillary refill takes less than 2 seconds.  Neurological:     Mental Status: She is alert and oriented to person, place, and time.  Psychiatric:        Behavior: Behavior normal.      Musculoskeletal Exam: C-spine, thoracic spine, and lumbar spine good ROM.  No midline spinal tenderness.  No SI joint tenderness.  Painful ROM of both shoulder joints with tenderness to palpation.  Tenderness along the joint  line of both elbows.  Painful ROM of both wrist joints but no synovitis noted.  Tenderness and mild synovitis of the right 2nd and 3rd MCP joints. Tenderness of bilateral 1st MCPs, right 4th MCP, and left 2nd and 3rd MCP joints.  Tenderness of all PIP joints.  Complete fist formation bilaterally.  Painful ROM of both hip joints. Painful ROM of both knee joints.  Left knee joint warmth and effusion noted.  No warmth or effusion of the right knee joint.  Ankle joints good ROM with no tenderness or inflammation.   CDAI Exam: CDAI Score: 19.6  Patient Global: 9 mm; Provider Global: 7 mm Swollen: 3 ; Tender: 15  Joint Exam 05/13/2020      Right  Left  Glenohumeral   Tender   Tender  Elbow   Tender   Tender  Wrist   Tender   Tender  MCP 1   Tender   Tender  MCP 2  Swollen Tender   Tender  MCP 3  Swollen Tender   Tender  MCP 4  Tender     Knee   Tender  Swollen Tender     Investigation: No additional findings.  Imaging: No results found.  Recent Labs: Lab Results  Component Value Date   WBC 5.8 05/13/2020   HGB 10.7 (L) 05/13/2020   PLT 348 05/13/2020   NA 140 05/13/2020   K 3.9 05/13/2020   CL 109 05/13/2020   CO2 20 (L) 05/13/2020   GLUCOSE 89 05/13/2020   BUN 10 05/13/2020   CREATININE 0.85 05/13/2020   BILITOT 0.3 05/13/2020   ALKPHOS 79 05/13/2020   AST 15 05/13/2020   ALT 18 05/13/2020   PROT 6.7 05/13/2020   ALBUMIN 3.4 (L) 05/13/2020   CALCIUM 8.7 (L) 05/13/2020   GFRAA 60 05/09/2020   QFTBGOLDPLUS NEGATIVE 01/15/2019    Speciality Comments: PPD- 07/08/17 Negative  Procedures:  No procedures performed Allergies: Sulfa antibiotics   Assessment / Plan:     Visit Diagnoses: Rheumatoid arthritis, seronegative, multiple sites [facility]): She presents today with persistent pain, joint stiffness, and inflammation in multiple joints. She has painful ROM of both shoulder joint and both wrist joints with joint tenderness.  Tenderness and synovitis of the right 2nd and 3rd  MCP joints noted.  Left knee joint warmth and effusion apparent. She was evaluated in our office on 04/21/20 and was restarted on methotrexate 8 tablets/wk.  She has not noticed any clinical improvement since restarting on MTX. She has had 2 doses of MTX and had updated lab work on 05/09/20: creatinine 1.07 and GFR was 60.  She has not been taking any NSAIDs and has not been started on any other medications recently.  We discussed that if we have to reduce the dose of MTX it is unlikely that MTX will be effective enough at managing her RA.  Different treatment options were discussed in detail today.  She does not want to restart on Humira (d/c prior to chemo and radiation therapy about 1 yr ago) at this time.  She does not want to start on an injectable medication at this time.  Indications, contraindications, and potential side effects of Arava were discussed to day.  All questions were addressed and consent was obtained.  She will be starting on arava 10 mg by mouth daily x2 weeks and if labs are stable she will increase to 20 mg daily.  She has updated CBC and CMP today which were reviewed with the patient.  Prescription will be sent to the pharmacy today along with a prednisone taper starting at 20 mg tapering by 5 mg every week. Discussed the importance of taking prednisone in the morning with breakfast and to avoid the use of NSAIDs. She will return for lab work in 2 wk x2 then every 3 months.  Standing orders remain in place.  We discussed that she will likely require more aggressive immunosuppression in the future to manage her RA but we will remain hopeful that arava will be efficacious.  She was advised to notify us if she cannot tolerate taking arava.  She will follow up in the office in 6-8 weeks to assess her response to arava.   Medication counseling:  Baseline Immunosuppressant Therapy Labs  Quantiferon TB Gold Latest Ref Rng & Units 01/15/2019  Quantiferon TB Gold Plus NEGATIVE NEGATIVE     Serum Protein Electrophoresis Latest Ref Rng & Units 05/13/2020  Total Protein 6.5 - 8.1 g/dL 6.7  Albumin 3.8 - 4.8 g/dL -  Alpha-1 0.2 - 0.3 g/dL -  Alpha-2 0.5 -  0.9 g/dL -  Beta Globulin 0.4 - 0.6 g/dL -  Beta 2 0.2 - 0.5 g/dL -  Gamma Globulin 0.8 - 1.7 g/dL -    Patient was counseled on the purpose, proper use, and adverse effects of leflunomide including risk of infection, nausea/diarrhea/weight loss, increase in blood pressure, rash, hair loss, tingling in the hands and feet, and signs and symptoms of interstitial lung disease.   Also counseled on Black Box warning of liver injury and importance of avoiding alcohol while on therapy. Discussed that there is the possibility of an increased risk of malignancy but it is not well understood if this increased risk is due to the medication or the disease state.  Counseled patient to avoid live vaccines. Recommend annual influenza, Pneumovax 23, Prevnar 13, and Shingrix as indicated.   Discussed the importance of frequent monitoring of liver function and blood count.  Standing orders placed.  Discussed importance of birth control while on leflunomide due to risk of congenital abnormalities, and patient confirms she is postmenopausal.  Provided patient with educational materials on leflunomide and answered all questions.  Patient consented to [place] use, and consent will be uploaded into the media tab.   Patient dose will be 10 mg daily x2 weeks and if labs are stable she will increase to 20 mg daily.  Prescription pending lab results and/or insurance approval  High risk medication use - She will be starting on Arava 10 mg 1 tablet by mouth daily x2 weeks and if labs are stable at that time she will increase to arava 20 mg daily.  She will return for lab work in 2 weeks x2 then every 3 months.  D/c MTX 8 tab/wk-elevated creatinine-1.07 on 05/09/20, GFR 60.  CMP was rechecked today at [facility]: creatinine 0.85 with GFR>60. Lab work reviewed and  discussed with the patient.  She has not taken ibuprofen since restarting on MTX.  Previously on MTX and humira-d/c after starting radiation and chemotherapy for management of squamous cell carcinoma.  She may require more aggressive immunosuppression by restarting humira in the future. She does not want to resume humira at this time.  She will require TB gold prior to her restarting humira if needed in the future.  We discussed the importance of holding arava if she develops signs or symptoms of an infection and to resume once the infection has completely cleared.  She was encouraged to continue to wear a mask and social distance.   She is planning on receiving the covid-19 vaccine booster once she has completed the course of prednisone.  We discussed the importance of avoiding NSAIDs and tylenol 24 hours prior to the booster dose.  She voiced understanding.   Age-related osteoporosis without current pathological fracture -  Nov 02, 2019 DEXA showed T score of -3.4 in the lumbar region.  She has been tolerating Fosamax and will continue taking vitamin D 2,000 units daily.  No recent falls or fractures. Repeat DEXA in May 2023.   History of vitamin D deficiency: She is taking vitamin D 2,000 units daily.   Primary osteoarthritis of both hands: She has PIP and DIP thickening consistent with osteoarthritis of both hands.  Tenderness of all PIP joints noted.  Complete fist formation bilaterally.    Primary osteoarthritis of both knees: She has been experiencing significant pain and stiffness in both knee joints.  She has also noticed increased nocturnal pain and intermittent swelling. She has painful ROM of both knee joints.  Warmth and moderate  effusion of left knee joint noted.  Declined cortisone injection.  A prednisone taper starting at 20 mg tapering by 5 mg every week was sent to the pharmacy.   Primary osteoarthritis of both feet: She has good ROM of both ankle joints with no tenderness or  inflammation.    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 Bertis Ruddy on 05/20/20 and her radiology oncologist on 10/08/20.  She had updated lab work today, which will be reviewed at her upcoming appointment.   Abnormal SPEP: Multiple myeloma panel ordered today at [facility].   Thrombocytosis: Plt count [id number] today on 05/13/20.  History of anemia: Hemoglobin 10.7 on 05/13/20.   Orders: No orders of the defined types were placed in this encounter.  Meds ordered this encounter  Medications  . predniSONE (DELTASONE) 5 MG tablet    Sig: Take 4 tablets by mouth daily x1wk, 3 tablets by mouth daily x1wk, 2 tablets by mouth daily x1wk, 1 tablet by mouth daily x1wk.    Dispense:  70 tablet    Refill:  0  . leflunomide (ARAVA) 10 MG tablet    Sig: Take one tab po daily x 2 weeks. If labs are stable increase to 2 tabs po daily.    Dispense:  60 tablet    Refill:  0     Follow-Up Instructions: Return in about 6 weeks (around 06/24/2020) for Rheumatoid arthritis, Osteoporosis, Osteoarthritis.   Gearldine Bienenstock, PA-C  Note - This record has been created using Dragon software.  Chart creation errors have been sought, but may not always  have been located. Such creation errors do not reflect on  the standard of medical care.

## 2020-05-13 ENCOUNTER — Inpatient Hospital Stay: Payer: Medicare Other | Attending: Hematology and Oncology

## 2020-05-13 ENCOUNTER — Encounter: Payer: Self-pay | Admitting: Physician Assistant

## 2020-05-13 ENCOUNTER — Ambulatory Visit: Payer: Medicare Other | Admitting: Physician Assistant

## 2020-05-13 ENCOUNTER — Other Ambulatory Visit: Payer: Self-pay

## 2020-05-13 VITALS — BP 139/72 | HR 111 | Resp 15 | Ht 63.0 in | Wt 116.4 lb

## 2020-05-13 DIAGNOSIS — C320 Malignant neoplasm of glottis: Secondary | ICD-10-CM | POA: Diagnosis present

## 2020-05-13 DIAGNOSIS — M0609 Rheumatoid arthritis without rheumatoid factor, multiple sites: Secondary | ICD-10-CM | POA: Diagnosis not present

## 2020-05-13 DIAGNOSIS — M170 Bilateral primary osteoarthritis of knee: Secondary | ICD-10-CM

## 2020-05-13 DIAGNOSIS — E039 Hypothyroidism, unspecified: Secondary | ICD-10-CM | POA: Diagnosis not present

## 2020-05-13 DIAGNOSIS — Z79899 Other long term (current) drug therapy: Secondary | ICD-10-CM

## 2020-05-13 DIAGNOSIS — M19072 Primary osteoarthritis, left ankle and foot: Secondary | ICD-10-CM

## 2020-05-13 DIAGNOSIS — M810 Age-related osteoporosis without current pathological fracture: Secondary | ICD-10-CM | POA: Diagnosis not present

## 2020-05-13 DIAGNOSIS — D75839 Thrombocytosis, unspecified: Secondary | ICD-10-CM

## 2020-05-13 DIAGNOSIS — D472 Monoclonal gammopathy: Secondary | ICD-10-CM | POA: Insufficient documentation

## 2020-05-13 DIAGNOSIS — R778 Other specified abnormalities of plasma proteins: Secondary | ICD-10-CM

## 2020-05-13 DIAGNOSIS — M19042 Primary osteoarthritis, left hand: Secondary | ICD-10-CM

## 2020-05-13 DIAGNOSIS — M19041 Primary osteoarthritis, right hand: Secondary | ICD-10-CM

## 2020-05-13 DIAGNOSIS — Z23 Encounter for immunization: Secondary | ICD-10-CM | POA: Diagnosis not present

## 2020-05-13 DIAGNOSIS — Z8639 Personal history of other endocrine, nutritional and metabolic disease: Secondary | ICD-10-CM

## 2020-05-13 DIAGNOSIS — M19071 Primary osteoarthritis, right ankle and foot: Secondary | ICD-10-CM

## 2020-05-13 DIAGNOSIS — Z780 Asymptomatic menopausal state: Secondary | ICD-10-CM

## 2020-05-13 DIAGNOSIS — Z862 Personal history of diseases of the blood and blood-forming organs and certain disorders involving the immune mechanism: Secondary | ICD-10-CM

## 2020-05-13 LAB — CBC WITH DIFFERENTIAL/PLATELET
Abs Immature Granulocytes: 0.01 10*3/uL (ref 0.00–0.07)
Basophils Absolute: 0 10*3/uL (ref 0.0–0.1)
Basophils Relative: 0 %
Eosinophils Absolute: 0 10*3/uL (ref 0.0–0.5)
Eosinophils Relative: 1 %
HCT: 34.4 % — ABNORMAL LOW (ref 36.0–46.0)
Hemoglobin: 10.7 g/dL — ABNORMAL LOW (ref 12.0–15.0)
Immature Granulocytes: 0 %
Lymphocytes Relative: 11 %
Lymphs Abs: 0.6 10*3/uL — ABNORMAL LOW (ref 0.7–4.0)
MCH: 30.7 pg (ref 26.0–34.0)
MCHC: 31.1 g/dL (ref 30.0–36.0)
MCV: 98.6 fL (ref 80.0–100.0)
Monocytes Absolute: 0.3 10*3/uL (ref 0.1–1.0)
Monocytes Relative: 5 %
Neutro Abs: 4.9 10*3/uL (ref 1.7–7.7)
Neutrophils Relative %: 83 %
Platelets: 348 10*3/uL (ref 150–400)
RBC: 3.49 MIL/uL — ABNORMAL LOW (ref 3.87–5.11)
RDW: 14.4 % (ref 11.5–15.5)
WBC: 5.8 10*3/uL (ref 4.0–10.5)
nRBC: 0 % (ref 0.0–0.2)

## 2020-05-13 LAB — COMPREHENSIVE METABOLIC PANEL
ALT: 18 U/L (ref 0–44)
AST: 15 U/L (ref 15–41)
Albumin: 3.4 g/dL — ABNORMAL LOW (ref 3.5–5.0)
Alkaline Phosphatase: 79 U/L (ref 38–126)
Anion gap: 11 (ref 5–15)
BUN: 10 mg/dL (ref 8–23)
CO2: 20 mmol/L — ABNORMAL LOW (ref 22–32)
Calcium: 8.7 mg/dL — ABNORMAL LOW (ref 8.9–10.3)
Chloride: 109 mmol/L (ref 98–111)
Creatinine, Ser: 0.85 mg/dL (ref 0.44–1.00)
GFR, Estimated: >60 mL/min (ref >60)
Glucose, Bld: 89 mg/dL (ref 70–99)
Potassium: 3.9 mmol/L (ref 3.5–5.1)
Sodium: 140 mmol/L (ref 135–145)
Total Bilirubin: 0.3 mg/dL (ref 0.3–1.2)
Total Protein: 6.7 g/dL (ref 6.5–8.1)

## 2020-05-13 LAB — TSH: TSH: 3.126 u[IU]/mL (ref 0.308–3.960)

## 2020-05-13 MED ORDER — LEFLUNOMIDE 10 MG PO TABS: ORAL_TABLET | ORAL | 0 refills | Status: DC

## 2020-05-13 MED ORDER — PREDNISONE 5 MG PO TABS: ORAL_TABLET | ORAL | 0 refills | Status: DC

## 2020-05-13 NOTE — Patient Instructions (Signed)
Standing Labs We placed an order today for your standing lab work.   Please have your standing labs drawn in 2 week x2 then every 3 months   If possible, please have your labs drawn 2 weeks prior to your appointment so that the provider can discuss your results at your appointment.  We have open lab daily Monday through Thursday from 8:30-12:30 PM and 1:30-4:30 PM and Friday from 8:30-12:30 PM and 1:30-4:00 PM at the office of Dr. Bo Merino, [facility].   Please be advised, patients with office appointments requiring lab work will take precedents over walk-in lab work.  If possible, please come for your lab work on Monday and Friday afternoons, as you may experience shorter wait times. The office is located at [address] [place], [place], [place] [address] No appointment is necessary.   Labs are drawn by [facility]. Please bring your co-pay at the time of your lab draw.  You may receive a bill from [facility] for your lab work.  If you wish to have your labs drawn at another location, please call the office 24 hours in advance to send orders.  If you have any questions regarding directions or hours of operation,  please call [phone].   As a reminder, please drink plenty of water prior to coming for your lab work. Thanks!    Leflunomide tablets What is this medicine? LEFLUNOMIDE (le FLOO na mide) is for rheumatoid arthritis. This medicine may be used for other purposes; ask your health care provider or pharmacist if you have questions. COMMON BRAND NAME(S): Arava What should I tell my health care provider before I take this medicine? They need to know if you have any of these conditions:  diabetes  have a fever or infection  high blood pressure  immune system problems  kidney disease  liver disease  low blood cell counts, like low white cell, platelet, or red cell counts  lung or breathing disease, like asthma  recently received or scheduled to  receive a vaccine  receiving treatment for cancer  skin conditions or sensitivity  tingling of the fingers or toes, or other nerve disorder  tuberculosis  an unusual or allergic reaction to leflunomide, teriflunomide, other medicines, food, dyes, or preservatives  pregnant or trying to get pregnant  breast-feeding How should I use this medicine? Take this medicine by mouth with a full glass of water. Follow the directions on the prescription label. Take your medicine at regular intervals. Do not take your medicine more often than directed. Do not stop taking except on your doctor's advice. Talk to your pediatrician regarding the use of this medicine in children. Special care may be needed. Overdosage: If you think you have taken too much of this medicine contact a poison control center or emergency room at once. NOTE: This medicine is only for you. Do not share this medicine with others. What if I miss a dose? If you miss a dose, take it as soon as you can. If it is almost time for your next dose, take only that dose. Do not take double or extra doses. What may interact with this medicine? Do not take this medicine with any of the following medications:  teriflunomide This medicine may also interact with the following medications:  alosetron  birth control pills  caffeine  cefaclor  certain medicines for diabetes like nateglinide, repaglinide, rosiglitazone, pioglitazone  certain medicines for high cholesterol like atorvastatin, pravastatin, rosuvastatin, simvastatin  charcoal  cholestyramine  ciprofloxacin  duloxetine  furosemide  ketoprofen  live virus vaccines  medicines that increase your risk for infection  methotrexate  mitoxantrone  paclitaxel  penicillin  theophylline  tizanidine  warfarin This list may not describe all possible interactions. Give your health care provider a list of all the medicines, herbs, non-prescription drugs, or  dietary supplements you use. Also tell them if you smoke, drink alcohol, or use illegal drugs. Some items may interact with your medicine. What should I watch for while using this medicine? Visit your health care provider for regular checks on your progress. Tell your doctor or health care provider if your symptoms do not start to get better or if they get worse. You may need blood work done while you are taking this medicine. This medicine may cause serious skin reactions. They can happen weeks to months after starting the medicine. Contact your health care provider right away if you notice fevers or flu-like symptoms with a rash. The rash may be red or purple and then turn into blisters or peeling of the skin. Or, you might notice a red rash with swelling of the face, lips or lymph nodes in your neck or under your arms. This medicine may stay in your body for up to 2 years after your last dose. Tell your doctor about any unusual side effects or symptoms. A medicine can be given to help lower your blood levels of this medicine more quickly. Women must use effective birth control with this medicine. There is a potential for serious side effects to an unborn child. Do not become pregnant while taking this medicine. Inform your doctor if you wish to become pregnant. This medicine remains in your blood after you stop taking it. You must continue using effective birth control until the blood levels have been checked and they are low enough. A medicine can be given to help lower your blood levels of this medicine more quickly. Immediately talk to your doctor if you think you may be pregnant. You may need a pregnancy test. Talk to your health care provider or pharmacist for more information. You should not receive certain vaccines during your treatment and for a certain time after your treatment with this medication ends. Talk to your health care provider for more information. What side effects may I notice from  receiving this medicine? Side effects that you should report to your doctor or health care professional as soon as possible:  allergic reactions like skin rash, itching or hives, swelling of the face, lips, or tongue  breathing problems  cough  increased blood pressure  low blood counts - this medicine may decrease the number of white blood cells and platelets. You may be at increased risk for infections and bleeding.  pain, tingling, numbness in the hands or feet  rash, fever, and swollen lymph nodes  redness, blistering, peeing or loosening of the skin, including inside the mouth  signs of decreased platelets or bleeding - bruising, pinpoint red spots on the skin, black, tarry stools, blood in urine  signs of infection - fever or chills, cough, sore throat, pain or trouble passing urine  signs and symptoms of liver injury like dark yellow or brown urine; general ill feeling or flu-like symptoms; light-colored stools; loss of appetite; nausea; right upper belly pain; unusually weak or tired; yellowing of the eyes or skin  trouble passing urine or change in the amount of urine  vomiting Side effects that usually do not require medical attention (report to your  doctor or health care professional if they continue or are bothersome):  diarrhea  hair thinning or loss  headache  nausea  tiredness This list may not describe all possible side effects. Call your doctor for medical advice about side effects. You may report side effects to FDA at 1-800-FDA-1088. Where should I keep my medicine? Keep out of the reach of children. Store at room temperature between 15 and 30 degrees C (59 and 86 degrees F). Protect from moisture and light. Throw away any unused medicine after the expiration date. NOTE: This sheet is a summary. It may not cover all possible information. If you have questions about this medicine, talk to your doctor, pharmacist, or health care provider.  2020  Elsevier/Gold Standard (2018-09-08 15:06:48)

## 2020-05-14 LAB — MULTIPLE MYELOMA PANEL, SERUM
Albumin SerPl Elph-Mcnc: 3.1 g/dL (ref 2.9–4.4)
Albumin/Glob SerPl: 1.1 (ref 0.7–1.7)
Alpha 1: 0.3 g/dL (ref 0.0–0.4)
Alpha2 Glob SerPl Elph-Mcnc: 0.9 g/dL (ref 0.4–1.0)
B-Globulin SerPl Elph-Mcnc: 1.1 g/dL (ref 0.7–1.3)
Gamma Glob SerPl Elph-Mcnc: 0.7 g/dL (ref 0.4–1.8)
Globulin, Total: 3 g/dL (ref 2.2–3.9)
IgA: 149 mg/dL (ref 64–422)
IgG (Immunoglobin G), Serum: 762 mg/dL (ref 586–1602)
IgM (Immunoglobulin M), Srm: 45 mg/dL (ref 26–217)
Total Protein ELP: 6.1 g/dL (ref 6.0–8.5)

## 2020-05-14 LAB — KAPPA/LAMBDA LIGHT CHAINS
Kappa free light chain: 17.6 mg/L (ref 3.3–19.4)
Kappa, lambda light chain ratio: 1.06 (ref 0.26–1.65)
Lambda free light chains: 16.6 mg/L (ref 5.7–26.3)

## 2020-05-20 ENCOUNTER — Telehealth: Payer: Self-pay | Admitting: Hematology and Oncology

## 2020-05-20 ENCOUNTER — Encounter: Payer: Self-pay | Admitting: Hematology and Oncology

## 2020-05-20 ENCOUNTER — Inpatient Hospital Stay (HOSPITAL_BASED_OUTPATIENT_CLINIC_OR_DEPARTMENT_OTHER): Payer: Medicare Other | Admitting: Hematology and Oncology

## 2020-05-20 ENCOUNTER — Other Ambulatory Visit: Payer: Self-pay

## 2020-05-20 VITALS — BP 138/71 | HR 94 | Temp 97.8°F | Resp 18 | Ht 63.0 in | Wt 117.6 lb

## 2020-05-20 DIAGNOSIS — Z299 Encounter for prophylactic measures, unspecified: Secondary | ICD-10-CM

## 2020-05-20 DIAGNOSIS — E039 Hypothyroidism, unspecified: Secondary | ICD-10-CM

## 2020-05-20 DIAGNOSIS — Z0000 Encounter for general adult medical examination without abnormal findings: Secondary | ICD-10-CM

## 2020-05-20 DIAGNOSIS — C320 Malignant neoplasm of glottis: Secondary | ICD-10-CM | POA: Diagnosis not present

## 2020-05-20 DIAGNOSIS — D472 Monoclonal gammopathy: Secondary | ICD-10-CM

## 2020-05-20 MED ORDER — INFLUENZA VAC A&B SA ADJ QUAD 0.5 ML IM PRSY
0.5000 mL | PREFILLED_SYRINGE | Freq: Once | INTRAMUSCULAR | Status: AC
  Administered 2020-05-20: 0.5 mL via INTRAMUSCULAR

## 2020-05-20 MED ORDER — INFLUENZA VAC A&B SA ADJ QUAD 0.5 ML IM PRSY
PREFILLED_SYRINGE | INTRAMUSCULAR | Status: AC
  Filled 2020-05-20: qty 0.5

## 2020-05-20 NOTE — Assessment & Plan Note (Signed)
Her TSH was within normal limits I plan to recheck it again in her next visit due to high risk of acquired hypothyroidism

## 2020-05-20 NOTE — Assessment & Plan Note (Signed)
We discussed the importance of preventive care and reviewed the vaccination programs. She does not have any prior allergic reactions to influenza vaccination. She agrees to proceed with influenza vaccination today and we will administer it today at the clinic.  

## 2020-05-20 NOTE — Assessment & Plan Note (Signed)
I did not detect any abnormal M protein or abnormal light chain ratio However, on further questioning, she was placed on prednisone recently that could partially treat the problem I plan to recheck MGUS panel again next year, around November 2022

## 2020-05-20 NOTE — Progress Notes (Signed)
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 [facility]) She has no signs or symptoms of cancer recurrence We discussed the importance of ENT follow-up for surveillance I will see her again in about 6 months for further follow-up We discussed the importance of TSH monitoring due to radiation exposure  MGUS (monoclonal gammopathy of unknown significance) I did not detect any abnormal M protein or abnormal light chain ratio However, on further questioning, she was placed on prednisone recently that could partially treat the problem I plan to recheck MGUS panel again next year, around November 2022  Acquired hypothyroidism Her TSH was within normal limits I plan to recheck it again in her next visit due to high risk of acquired hypothyroidism  Preventive measure We discussed the importance of preventive care and reviewed the vaccination programs. She does not have any prior allergic reactions to influenza vaccination. She agrees to proceed with influenza vaccination today and we will administer it today at the clinic.    No orders of the defined types were placed in this encounter.   All questions were answered. The patient knows to call the clinic with any problems, questions or concerns. The total time spent in the appointment was 25 minutes encounter with patients including review of chart and various tests results, discussions about plan of care and coordination of care plan   [name], MD 05/20/2020 1:12 PM  INTERVAL HISTORY: Please see below for problem oriented charting. 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 VOCAL CORD MASS, LEFT, EXCISION:  - Squamous cell carcinoma.  - See comment.    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 Left glottic lesion is hypermetabolic with maximum SUV 8.9, compatible with malignancy. No discrete adenopathy in the neck. 2. Extensive periarticular activity especially around the large joints, likely related to rheumatoid arthritis. 3. Small but hypermetabolic axillary and left external iliac lymph nodes are most likely reactive to the patient's rheumatoid arthritis, but merit surveillance. 4. Mild right hydronephrosis without hydroureter, query right UPJ narrowing. Consider follow renal sonography. 5. Prominent but not hypermetabolic left adnexa, possibly due to prominent ovary or left eccentricity of the uterus, correlate with surgical history. This could be further investigated with pelvic sonography if clinically warranted. 6. Other imaging findings of potential clinical significance: Chronic left maxillary sinusitis. Aortic Atherosclerosis (ICD10-I70.0). Coronary atherosclerosis. Emphysema (ICD10-J43.9). Sigmoid colon diverticulosis.   04/10/2019 Imaging   CT neck w/ contrast:  1. Left glottic mass extending to but not beyond the anterior commissure. There is extension into the left paraglottic fat without cartilage erosion. 2. No  adenopathy. 3. Aortic  Atherosclerosis (ICD10-I70.0) and Emphysema (ICD10-J43.9).   04/11/2019 Cancer Staging   Staging form: Larynx - Glottis, AJCC 8th Edition - Clinical stage from 04/11/2019: Stage III (cT3, cN0, cM0) - Signed by Eppie Gibson, MD on 04/11/2019   05/03/2019 - 06/29/2019 Chemotherapy   The patient had cisplatin for chemotherapy treatment.     10/08/2019 PET scan   1. Complete metabolic response to therapy of left-sided glottic primary. 2. Suboptimal evaluation for cervical nodal disease secondary to extensive hypermetabolic "brown" fat within the neck and chest. Given this limitation, no evidence of metastatic disease. 3. Decreased size of hypermetabolic mediastinal node, likely reactive. 4. Aortic atherosclerosis (ICD10-I70.0), coronary artery atherosclerosis and emphysema (ICD10-J43.9). 5. Similar appearance of the right kidney, possibly related to chronic ureteropelvic junction obstruction versus pyelonephritis. 6. Left nephrolithiasis.   11/05/2019 Procedure   Successful right IJ vein Port-A-Cath explantation.   Successful removal of percutaneous gastrostomy tube     REVIEW OF SYSTEMS:   Constitutional: Denies fevers, chills or abnormal weight loss Eyes: Denies blurriness of vision Ears, nose, mouth, throat, and face: Denies mucositis or sore throat Respiratory: Denies cough, dyspnea or wheezes Cardiovascular: Denies palpitation, chest discomfort or lower extremity swelling Gastrointestinal:  Denies nausea, heartburn or change in bowel habits Skin: Denies abnormal skin rashes Lymphatics: Denies new lymphadenopathy or easy bruising Neurological:Denies numbness, tingling or new weaknesses Behavioral/Psych: Mood is stable, no new changes  All other systems were reviewed with the patient and are negative.  I have reviewed the past medical history, past surgical history, social history and family history with the patient and they are unchanged from previous note.  ALLERGIES:  is  allergic to sulfa antibiotics.  MEDICATIONS:  Current Outpatient Medications  Medication Sig Dispense Refill  . alendronate (FOSAMAX) 70 MG tablet TAKE 1 TABLET(70 MG) BY MOUTH 1 TIME A WEEK WITH A FULL GLASS OF WATER AND ON AN EMPTY STOMACH 12 tablet 0  . amLODipine (NORVASC) 5 MG tablet Take 5 mg by mouth daily.  0  . Cholecalciferol (VITAMIN D) 50 MCG (2000 UT) CAPS Take 2,000 Units by mouth daily.     Mariane Baumgarten Calcium (STOOL SOFTENER PO) Take by mouth.    . leflunomide (ARAVA) 10 MG tablet Take one tab po daily x 2 weeks. If labs are stable increase to 2 tabs po daily. 60 tablet 0  . losartan (COZAAR) 100 MG tablet Take 100 mg by mouth daily.   0  . predniSONE (DELTASONE) 5 MG tablet Take 4 tablets by mouth daily x1wk, 3 tablets by mouth daily x1wk, 2 tablets by mouth daily x1wk, 1 tablet by mouth daily x1wk. 70 tablet 0  . Red Yeast Rice 500 MG/0.5GM POWD Take 500 mg by mouth every other day.    . vitamin B-12 (CYANOCOBALAMIN) 1000 MCG tablet Take 1,000 mcg by mouth daily.     No current facility-administered medications for this visit.    PHYSICAL EXAMINATION: ECOG PERFORMANCE STATUS: 1 - Symptomatic but completely ambulatory  Vitals:   05/20/20 0842  BP: 138/71  Pulse: 94  Resp: 18  Temp: 97.8 F (36.6 C)  SpO2: 100%   Filed Weights   05/20/20 0842  Weight: 117 lb 9.6 oz (53.3 kg)    GENERAL:alert, no distress and comfortable SKIN: skin color, texture, turgor are normal, no rashes or significant lesions EYES: normal, Conjunctiva are pink and non-injected, sclera clear OROPHARYNX:no exudate, no erythema and lips, buccal mucosa, and tongue normal.  Noted mild mucositis on her  lower lip NECK: supple, thyroid normal size, non-tender, without nodularity LYMPH:  no palpable lymphadenopathy in the cervical, axillary or inguinal LUNGS: clear to auscultation and percussion with normal breathing effort HEART: regular rate & rhythm and no murmurs and no lower extremity  edema ABDOMEN:abdomen soft, non-tender and normal bowel sounds Musculoskeletal:no cyanosis of digits and no clubbing  NEURO: alert & oriented x 3 with fluent speech, no focal motor/sensory deficits  LABORATORY DATA:  I have reviewed the data as listed    Component Value Date/Time   NA 140 05/13/2020 0857   NA 141 03/05/2014 1050   K 3.9 05/13/2020 0857   K 3.9 03/05/2014 1050   CL 109 05/13/2020 0857   CO2 20 (L) 05/13/2020 0857   CO2 24 03/05/2014 1050   GLUCOSE 89 05/13/2020 0857   GLUCOSE 99 03/05/2014 1050   BUN 10 05/13/2020 0857   BUN 10.6 03/05/2014 1050   CREATININE 0.85 05/13/2020 0857   CREATININE 1.07 (H) 05/09/2020 1533   CREATININE 0.8 03/05/2014 1050   CALCIUM 8.7 (L) 05/13/2020 0857   CALCIUM 8.8 03/05/2014 1050   PROT 6.7 05/13/2020 0857   PROT 6.5 03/05/2014 1050   ALBUMIN 3.4 (L) 05/13/2020 0857   ALBUMIN 3.2 (L) 03/05/2014 1050   AST 15 05/13/2020 0857   AST 13 (L) 10/10/2019 1402   AST 12 03/05/2014 1050   ALT 18 05/13/2020 0857   ALT 10 10/10/2019 1402   ALT 28 03/05/2014 1050   ALKPHOS 79 05/13/2020 0857   ALKPHOS 85 03/05/2014 1050   BILITOT 0.3 05/13/2020 0857   BILITOT <0.2 (L) 10/10/2019 1402   BILITOT 0.20 03/05/2014 1050   GFRNONAA >60 05/13/2020 0857   GFRNONAA 52 (L) 05/09/2020 1533   GFRAA 60 05/09/2020 1533    No results found for: SPEP, UPEP  Lab Results  Component Value Date   WBC 5.8 05/13/2020   NEUTROABS 4.9 05/13/2020   HGB 10.7 (L) 05/13/2020   HCT 34.4 (L) 05/13/2020   MCV 98.6 05/13/2020   PLT 348 05/13/2020      Chemistry      Component Value Date/Time   NA 140 05/13/2020 0857   NA 141 03/05/2014 1050   K 3.9 05/13/2020 0857   K 3.9 03/05/2014 1050   CL 109 05/13/2020 0857   CO2 20 (L) 05/13/2020 0857   CO2 24 03/05/2014 1050   BUN 10 05/13/2020 0857   BUN 10.6 03/05/2014 1050   CREATININE 0.85 05/13/2020 0857   CREATININE 1.07 (H) 05/09/2020 1533   CREATININE 0.8 03/05/2014 1050      Component Value  Date/Time   CALCIUM 8.7 (L) 05/13/2020 0857   CALCIUM 8.8 03/05/2014 1050   ALKPHOS 79 05/13/2020 0857   ALKPHOS 85 03/05/2014 1050   AST 15 05/13/2020 0857   AST 13 (L) 10/10/2019 1402   AST 12 03/05/2014 1050   ALT 18 05/13/2020 0857   ALT 10 10/10/2019 1402   ALT 28 03/05/2014 1050   BILITOT 0.3 05/13/2020 0857   BILITOT <0.2 (L) 10/10/2019 1402   BILITOT 0.20 03/05/2014 1050

## 2020-05-20 NOTE — Assessment & Plan Note (Signed)
She has no signs or symptoms of cancer recurrence We discussed the importance of ENT follow-up for surveillance I will see her again in about 6 months for further follow-up We discussed the importance of TSH monitoring due to radiation exposure

## 2020-05-20 NOTE — Telephone Encounter (Signed)
Scheduled appts per 11/30 sch msg. Gave pt a print out of AVS.

## 2020-05-29 ENCOUNTER — Other Ambulatory Visit: Payer: Self-pay | Admitting: *Deleted

## 2020-05-29 DIAGNOSIS — Z79899 Other long term (current) drug therapy: Secondary | ICD-10-CM

## 2020-05-30 LAB — COMPLETE METABOLIC PANEL WITH GFR
AG Ratio: 1.7 (calc) (ref 1.0–2.5)
ALT: 10 U/L (ref 6–29)
AST: 14 U/L (ref 10–35)
Albumin: 4.1 g/dL (ref 3.6–5.1)
Alkaline phosphatase (APISO): 67 U/L (ref 37–153)
BUN/Creatinine Ratio: 18 (calc) (ref 6–22)
BUN: 17 mg/dL (ref 7–25)
CO2: 26 mmol/L (ref 20–32)
Calcium: 9.3 mg/dL (ref 8.6–10.4)
Chloride: 105 mmol/L (ref 98–110)
Creat: 0.97 mg/dL — ABNORMAL HIGH (ref 0.60–0.93)
GFR, Est African American: 68 mL/min/{1.73_m2} (ref >=60)
GFR, Est Non African American: 58 mL/min/{1.73_m2} — ABNORMAL LOW (ref >=60)
Globulin: 2.4 g/dL (calc) (ref 1.9–3.7)
Glucose, Bld: 68 mg/dL (ref 65–99)
Potassium: 4.2 mmol/L (ref 3.5–5.3)
Sodium: 140 mmol/L (ref 135–146)
Total Bilirubin: 0.3 mg/dL (ref 0.2–1.2)
Total Protein: 6.5 g/dL (ref 6.1–8.1)

## 2020-05-30 LAB — CBC WITH DIFFERENTIAL/PLATELET
Absolute Monocytes: 607 cells/uL (ref 200–950)
Basophils Absolute: 18 cells/uL (ref 0–200)
Basophils Relative: 0.2 %
Eosinophils Absolute: 18 cells/uL (ref 15–500)
Eosinophils Relative: 0.2 %
HCT: 35 % (ref 35.0–45.0)
Hemoglobin: 11.6 g/dL — ABNORMAL LOW (ref 11.7–15.5)
Lymphs Abs: 1329 cells/uL (ref 850–3900)
MCH: 31.5 pg (ref 27.0–33.0)
MCHC: 33.1 g/dL (ref 32.0–36.0)
MCV: 95.1 fL (ref 80.0–100.0)
MPV: 8.8 fL (ref 7.5–12.5)
Monocytes Relative: 6.9 %
Neutro Abs: 6829 cells/uL (ref 1500–7800)
Neutrophils Relative %: 77.6 %
Platelets: 409 10*3/uL — ABNORMAL HIGH (ref 140–400)
RBC: 3.68 10*6/uL — ABNORMAL LOW (ref 3.80–5.10)
RDW: 13.9 % (ref 11.0–15.0)
Total Lymphocyte: 15.1 %
WBC: 8.8 10*3/uL (ref 3.8–10.8)

## 2020-05-30 NOTE — Progress Notes (Signed)
CBC and CMP are stable.

## 2020-06-02 NOTE — Progress Notes (Signed)
Office Visit Note  Patient: Jean [name]             Date of Birth: 1945/08/15           MRN: [id number]             PCP: [name], MD Referring: [name], MD Visit Date: 06/03/2020 Occupation: @GUAROCC @  Subjective:  Neck pain   History of Present Illness: Jean [name] is a 74 y.o. female with history of seronegative rheumatoid arthritis, osteoarthritis, and osteoporosis. She is taking arava 10 mg by mouth daily.  She is tolerating arava without any side effects.  She did not increase the dose of arava after her most recent lab work on 05/29/20 but is ready to.  She states she has been taking the prednisone taper as prescribed and will be reducing to 5 mg daily starting tomorrow.  She states she has noticed a significant clinical improvement since starting on prednisone and arava.  She continues to have some stiffness and discomfort in both hands in the morning but overall her discomfort has improved.   She presents today with increased neck pain which started on Saturday.  She denies any injury prior to the onset of symptoms.  She has not been performing any activities out of the ordinary.  She states that she is having midline tenderness as well as radiating pain down the left side of her neck.  She says she is also been experiencing muscle spasms.  She tried taking Motrin but did not notice much improvement in her symptoms.  She has also tried ice, heat, Biofreeze, and massage.  She describes the pain is constant but exacerbated by activities. She continues to take fosamax 70 mg 1 tablet by mouth once weekly and vitamin D 2,000 units daily.  Activities of Daily Living:  Patient reports morning stiffness for 5-10 minutes.   Patient Denies nocturnal pain.  Difficulty dressing/grooming: Denies Difficulty climbing stairs: Denies Difficulty getting out of chair: Denies Difficulty using hands for taps, buttons, cutlery, and/or writing: Denies  Review of Systems   Constitutional: Negative for fatigue.  HENT: Negative for mouth sores, mouth dryness and nose dryness.   Eyes: Negative for pain, visual disturbance and dryness.  Respiratory: Negative for cough, hemoptysis, shortness of breath and difficulty breathing.   Cardiovascular: Positive for swelling in legs/feet. Negative for chest pain, palpitations and hypertension.  Gastrointestinal: Negative for blood in stool, constipation and diarrhea.  Endocrine: Negative for increased urination.  Genitourinary: Negative for painful urination.  Musculoskeletal: Positive for arthralgias, joint pain, joint swelling, myalgias, muscle weakness, morning stiffness, muscle tenderness and myalgias.  Skin: Negative for color change, pallor, rash, hair loss, nodules/bumps, skin tightness, ulcers and sensitivity to sunlight.  Allergic/Immunologic: Negative for susceptible to infections.  Neurological: Positive for numbness. Negative for dizziness, headaches and weakness.  Hematological: Negative for swollen glands.  Psychiatric/Behavioral: Negative for depressed mood and sleep disturbance. The patient is not nervous/anxious.     PMFS History:  Patient Active Problem List   Diagnosis Date Noted  . Preventive measure 05/20/2020  . MGUS (monoclonal gammopathy of unknown significance) 12/25/2019  . Acquired hypothyroidism 12/25/2019  . Protein malnutrition (HCC) 05/16/2019  . Constipation due to opioid therapy 05/09/2019  . Port-A-Cath in [name] 05/02/2019  . 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 Vitamin D deficiency 01/23/2017  . Rheumatoid arthritis, seronegative, multiple sites (HCC) 01/19/2017  . High risk medication use 01/19/2017  . Primary osteoarthritis of both  hands 01/19/2017  . Primary osteoarthritis of both knees 01/19/2017  . Fatigue 01/19/2017  . Thrombocytosis 01/19/2017  . Primary osteoarthritis of both feet 01/19/2017  . Macrocytic anemia 01/19/2017  .  Screening-pulmonary TB 04/26/2016    Past Medical History:  Diagnosis Date  . Arthritis   . Cancer (HCC)   . History of radiation therapy 05/03/19- 06/25/19   Larynx 35 fractions of 2 Gy each to total 70 Gy  . Hypertension   . Rheumatoid arthritis (HCC)   . Vocal cord mass     Family History  Problem Relation Age of Onset  . Stroke Mother   . Hypertension Daughter    Past Surgical History:  Procedure Laterality Date  . ECTOPIC PREGNANCY SURGERY    . EXTERNAL EAR SURGERY Right    removal of cyst or gland  . IR GASTROSTOMY TUBE MOD SED  04/26/2019  . IR GASTROSTOMY TUBE REMOVAL  11/05/2019  . IR IMAGING GUIDED PORT INSERTION  04/26/2019  . IR REMOVAL TUN ACCESS W/ PORT W/O FL MOD SED  11/05/2019  . MICROLARYNGOSCOPY Left 03/26/2019   Procedure: DIRECT MICROLARYNGOSCOPY WITH BIOPSY OF VOCAL CORD MASS;  Surgeon: Newman Pies, MD;  Location: [facility];  Service: ENT;  Laterality: Left;   Social History   Social History Narrative   Patient was recently widowed in March 2020.   Patient has 1 daughter who lives in [place]   Patient moved to [place] from [place] in January 2009.   Patient is a retired [employer].   Immunization History  Administered Date(s) Administered  . Fluad Quad(high Dose 65+) 05/20/2020  . Influenza, High Dose Seasonal PF 06/09/2017  . PFIZER SARS-COV-2 Vaccination 08/29/2019, 09/19/2019     Objective: Vital Signs: BP 138/78 (BP Location: Right Arm, Patient Position: Sitting, Cuff Size: Small)   Pulse 96   Ht 5\' 3"  (1.6 m)   Wt 117 lb 3.2 oz (53.2 kg)   BMI 20.76 kg/m    Physical Exam Vitals and nursing note reviewed.  Constitutional:      Appearance: She is well-developed and well-nourished.  HENT:     Head: Normocephalic and atraumatic.  Eyes:     Extraocular Movements: EOM normal.     Conjunctiva/sclera: Conjunctivae normal.  Cardiovascular:     Pulses: Intact distal pulses.  Pulmonary:     Effort: Pulmonary effort  is normal.  Abdominal:     Palpations: Abdomen is soft.  Musculoskeletal:     Cervical back: Normal range of motion.  Skin:    General: Skin is warm and dry.     Capillary Refill: Capillary refill takes less than 2 seconds.  Neurological:     Mental Status: She is alert and oriented to person, [name], and time.  Psychiatric:        Mood and Affect: Mood and affect normal.        Behavior: Behavior normal.      Musculoskeletal Exam:  C-spine limited ROM with lateral rotation.  Tenderness and spasms of the left trapezius muscle.  Thoracic spine and lumbar spine good ROM.  Midline spinal tenderness over the C-spine.  No midline spinal tenderness over thoracic or lumbar spine.  Shoulder joints, elbow joints, wrist joints, MCPs, PIPs, and DIPs good ROM with no synovitis.  Complete fist formation bilaterally.  Tenderness and synovitis of right 2nd PIP joint.  Knee joints good ROM with no warmth or effusion.  Ankle joints good ROM with no tenderness or inflammation.  CDAI Exam: CDAI Score: -- Patient Global: --; Provider Global: -- Swollen: 1 ; Tender: 2  Joint Exam 06/03/2020      Right  Left  PIP 2  Swollen Tender     Cervical Spine   Tender        Investigation: No additional findings.  Imaging: No results found.  Recent Labs: Lab Results  Component Value Date   WBC 8.8 05/29/2020   HGB 11.6 (L) 05/29/2020   PLT 409 (H) 05/29/2020   NA 140 05/29/2020   K 4.2 05/29/2020   CL 105 05/29/2020   CO2 26 05/29/2020   GLUCOSE 68 05/29/2020   BUN 17 05/29/2020   CREATININE 0.97 (H) 05/29/2020   BILITOT 0.3 05/29/2020   ALKPHOS 79 05/13/2020   AST 14 05/29/2020   ALT 10 05/29/2020   PROT 6.5 05/29/2020   ALBUMIN 3.4 (L) 05/13/2020   CALCIUM 9.3 05/29/2020   GFRAA 68 05/29/2020   QFTBGOLDPLUS NEGATIVE 01/15/2019    Speciality Comments: PPD- 07/08/17 Negative  Procedures:  No procedures performed Allergies: Sulfa antibiotics   Assessment / Plan:     Visit Diagnoses:  Rheumatoid arthritis, seronegative, multiple sites [facility]): She has tenderness and synovitis of the right second PIP joint.  Her bilateral knee joint pain has improved.  She has good range of motion of both knee joints on examination today with no warmth or effusion.  She continues to experience some stiffness and discomfort in the left ankle joint first thing in the morning which is typically self resolving in 5 to 10 minutes.  She presents today with increased discomfort in the C-spine which started 4 days ago without any injury or change in activity prior to the onset of symptoms.  X-rays of the C-spine were obtained today.  She has noticed significant clinical improvement since starting on Arava 10 mg 1 tablet by mouth daily as well as a prednisone taper starting at 20 mg tapering by 5 mg every week.  She has been tolerating Arava without any side effects.  She had updated lab work on 05/29/2020 which was stable.  She was advised to increase the dose of Arava to 20 mg 1 tablet by mouth daily.  A refill of Arava was sent to the pharmacy today.  She will complete the prednisone taper as prescribed.  She was advised to notify us if her joint pain returns or worsens.  She will follow-up in the office in 2 months.   High risk medication use - Arava 20 mg 1 tablet by mouth daily, D/c MTX 8 tab/wk-elevated creatinine-1.07 on 05/09/20, GFR 60. Previously on MTX and humira-d/c after starting radiation and chemotherapy.  CBC and CMP were drawn on 05/29/2020.  She will be increasing right back on 10 mg to 20 mg by mouth daily starting tomorrow.  A new prescription for Arava sent to the pharmacy.  She will return for lab work in 2 weeks, then 2 months, then every 3 months.  Standing orders for CBC and CMP remain in [name].  Age-related osteoporosis without current pathological fracture - Nov 02, 2019 DEXA showed T score of -3.4 in the lumbar region.  She has been tolerating Fosamax and will continue taking vitamin D 2,000  units daily.  She has not had any recent falls or fractures.  History of vitamin D deficiency: She is taking vitamin D 2,000 units daily.   Primary osteoarthritis of both hands: She has PIP and DIP thickening consistent with osteoarthritis of both hands.  Tenderness and synovitis of the right second PIP joint.  She was encouraged to continue to take the prednisone taper as prescribed.  Primary osteoarthritis of both knees: She has good ROM of both knee joints with no discomfort.  No warmth or effusion of knee joints noted on exam.   Primary osteoarthritis of both feet: Ankle joints have good ROM with no discomfort or inflammation.  She continues to experience some stiffness and discomfort in her left ankle joint first thing in the morning.  Neck pain - She presents today with increased neck pain which started 4 days ago.  She did not have any injury or changes in activity prior to the onset of symptoms.  She has experienced symptoms similar to this in the past. She has tried taking Motrin, applying heat, ice, Biofreeze, and performing massage without much relief.  She has been experiencing muscle spasms on the left side of her neck.  She has limited ROM with lateral rotation bilaterally.  A prescription for methocarbamol 500 mg twice daily as needed for muscle spasms was sent to the pharmacy today.  X-rays of the C-spine were obtained for further evaluation. We will call her with the x-ray results and further recommendations.  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 Bertis Ruddy on 05/20/20 and her radiology oncologist on 10/08/20.  Thrombocytosis  Abnormal SPEP  History of anemia    Orders: Orders Placed This Encounter  Procedures  . XR Cervical Spine 2 or 3 views   Meds ordered this encounter  Medications  . leflunomide (ARAVA) 20 MG  tablet    Sig: Take 1 tablet (20 mg total) by mouth daily.    Dispense:  90 tablet    Refill:  0  . methocarbamol (ROBAXIN) 500 MG tablet    Sig: Take 1 tablet (500 mg total) by mouth 2 (two) times daily as needed for muscle spasms.    Dispense:  30 tablet    Refill:  0     Follow-Up Instructions: Return in about 3 months (around 09/01/2020) for Rheumatoid arthritis, Osteoarthritis, Osteoporosis.   Gearldine Bienenstock, PA-C  Note - This record has been created using Dragon software.  Chart creation errors have been sought, but may not always  have been located. Such creation errors do not reflect on  the standard of medical care.

## 2020-06-03 ENCOUNTER — Ambulatory Visit: Payer: Medicare Other | Admitting: Physician Assistant

## 2020-06-03 ENCOUNTER — Ambulatory Visit: Payer: Self-pay

## 2020-06-03 ENCOUNTER — Encounter: Payer: Self-pay | Admitting: Physician Assistant

## 2020-06-03 ENCOUNTER — Other Ambulatory Visit: Payer: Self-pay

## 2020-06-03 VITALS — BP 138/78 | HR 96 | Ht 63.0 in | Wt 117.2 lb

## 2020-06-03 DIAGNOSIS — Z8639 Personal history of other endocrine, nutritional and metabolic disease: Secondary | ICD-10-CM | POA: Diagnosis not present

## 2020-06-03 DIAGNOSIS — M170 Bilateral primary osteoarthritis of knee: Secondary | ICD-10-CM

## 2020-06-03 DIAGNOSIS — M542 Cervicalgia: Secondary | ICD-10-CM

## 2020-06-03 DIAGNOSIS — M810 Age-related osteoporosis without current pathological fracture: Secondary | ICD-10-CM

## 2020-06-03 DIAGNOSIS — Z862 Personal history of diseases of the blood and blood-forming organs and certain disorders involving the immune mechanism: Secondary | ICD-10-CM

## 2020-06-03 DIAGNOSIS — M19041 Primary osteoarthritis, right hand: Secondary | ICD-10-CM

## 2020-06-03 DIAGNOSIS — Z780 Asymptomatic menopausal state: Secondary | ICD-10-CM

## 2020-06-03 DIAGNOSIS — Z79899 Other long term (current) drug therapy: Secondary | ICD-10-CM

## 2020-06-03 DIAGNOSIS — M19071 Primary osteoarthritis, right ankle and foot: Secondary | ICD-10-CM

## 2020-06-03 DIAGNOSIS — R778 Other specified abnormalities of plasma proteins: Secondary | ICD-10-CM

## 2020-06-03 DIAGNOSIS — C320 Malignant neoplasm of glottis: Secondary | ICD-10-CM

## 2020-06-03 DIAGNOSIS — M19072 Primary osteoarthritis, left ankle and foot: Secondary | ICD-10-CM

## 2020-06-03 DIAGNOSIS — M0609 Rheumatoid arthritis without rheumatoid factor, multiple sites: Secondary | ICD-10-CM

## 2020-06-03 DIAGNOSIS — M19042 Primary osteoarthritis, left hand: Secondary | ICD-10-CM

## 2020-06-03 DIAGNOSIS — D75839 Thrombocytosis, unspecified: Secondary | ICD-10-CM

## 2020-06-03 MED ORDER — LEFLUNOMIDE 20 MG PO TABS: 20.0000 mg | ORAL_TABLET | Freq: Every day | ORAL | 0 refills | Status: DC

## 2020-06-03 MED ORDER — METHOCARBAMOL 500 MG PO TABS: 500.0000 mg | ORAL_TABLET | Freq: Two times a day (BID) | ORAL | 0 refills | Status: DC | PRN

## 2020-06-03 NOTE — Patient Instructions (Signed)
Standing Labs We placed an order today for your standing lab work.   Please have your standing labs drawn in 2 weeks, 2 months, then every 3 months   If possible, please have your labs drawn 2 weeks prior to your appointment so that the provider can discuss your results at your appointment.  We have open lab daily Monday through Thursday from 8:30-12:30 PM and 1:30-4:30 PM and Friday from 8:30-12:30 PM and 1:30-4:00 PM at the office of Dr. Bo Merino, [facility].   Please be advised, patients with office appointments requiring lab work will take precedents over walk-in lab work.  If possible, please come for your lab work on Monday and Friday afternoons, as you may experience shorter wait times. The office is located at [address], [place], [place], [place] [address] No appointment is necessary.   Labs are drawn by [facility]. Please bring your co-pay at the time of your lab draw.  You may receive a bill from [facility] for your lab work.  If you wish to have your labs drawn at another location, please call the office 24 hours in advance to send orders.  If you have any questions regarding directions or hours of operation,  please call [phone].   As a reminder, please drink plenty of water prior to coming for your lab work. Thanks!    Neck Exercises Ask your health care provider which exercises are safe for you. Do exercises exactly as told by your health care provider and adjust them as directed. It is normal to feel mild stretching, pulling, tightness, or discomfort as you do these exercises. Stop right away if you feel sudden pain or your pain gets worse. Do not begin these exercises until told by your health care provider. Neck exercises can be important for many reasons. They can improve strength and maintain flexibility in your neck, which will help your upper back and prevent neck pain. Stretching exercises Rotation neck stretching  1. Sit in a chair or stand  up. 2. Place your feet flat on the floor, shoulder width apart. 3. Slowly turn your head (rotate) to the right until a slight stretch is felt. Turn it all the way to the right so you can look over your right shoulder. Do not tilt or tip your head. 4. Hold this position for 10-30 seconds. 5. Slowly turn your head (rotate) to the left until a slight stretch is felt. Turn it all the way to the left so you can look over your left shoulder. Do not tilt or tip your head. 6. Hold this position for 10-30 seconds. Repeat __________ times. Complete this exercise __________ times a day. Neck retraction 1. Sit in a sturdy chair or stand up. 2. Look straight ahead. Do not bend your neck. 3. Use your fingers to push your chin backward (retraction). Do not bend your neck for this movement. Continue to face straight ahead. If you are doing the exercise properly, you will feel a slight sensation in your throat and a stretch at the back of your neck. 4. Hold the stretch for 1-2 seconds. Repeat __________ times. Complete this exercise __________ times a day. Strengthening exercises Neck press 1. Lie on your back on a firm bed or on the floor with a pillow under your head. 2. Use your neck muscles to push your head down on the pillow and straighten your spine. 3. Hold the position as well as you can. Keep your head facing up (in a neutral  position) and your chin tucked. 4. Slowly count to 5 while holding this position. Repeat __________ times. Complete this exercise __________ times a day. Isometrics These are exercises in which you strengthen the muscles in your neck while keeping your neck still (isometrics). 1. Sit in a supportive chair and place your hand on your forehead. 2. Keep your head and face facing straight ahead. Do not flex or extend your neck while doing isometrics. 3. Push forward with your head and neck while pushing back with your hand. Hold for 10 seconds. 4. Do the sequence again, this time  putting your hand against the back of your head. Use your head and neck to push backward against the hand pressure. 5. Finally, do the same exercise on either side of your head, pushing sideways against the pressure of your hand. Repeat __________ times. Complete this exercise __________ times a day. Prone head lifts 1. Lie face-down (prone position), resting on your elbows so that your chest and upper back are raised. 2. Start with your head facing downward, near your chest. Position your chin either on or near your chest. 3. Slowly lift your head upward. Lift until you are looking straight ahead. Then continue lifting your head as far back as you can comfortably stretch. 4. Hold your head up for 5 seconds. Then slowly lower it to your starting position. Repeat __________ times. Complete this exercise __________ times a day. Supine head lifts 1. Lie on your back (supine position), bending your knees to point to the ceiling and keeping your feet flat on the floor. 2. Lift your head slowly off the floor, raising your chin toward your chest. 3. Hold for 5 seconds. Repeat __________ times. Complete this exercise __________ times a day. Scapular retraction 1. Stand with your arms at your sides. Look straight ahead. 2. Slowly pull both shoulders (scapulae) backward and downward (retraction) until you feel a stretch between your shoulder blades in your upper back. 3. Hold for 10-30 seconds. 4. Relax and repeat. Repeat __________ times. Complete this exercise __________ times a day. Contact a health care provider if:  Your neck pain or discomfort gets much worse when you do an exercise.  Your neck pain or discomfort does not improve within 2 hours after you exercise. If you have any of these problems, stop exercising right away. Do not do the exercises again unless your health care provider says that you can. Get help right away if:  You develop sudden, severe neck pain. If this happens, stop  exercising right away. Do not do the exercises again unless your health care provider says that you can. This information is not intended to replace advice given to you by your health care provider. Make sure you discuss any questions you have with your health care provider. Document Revised: 04/05/2018 Document Reviewed: 04/05/2018 Elsevier Patient Education  [place].

## 2020-06-04 ENCOUNTER — Telehealth: Payer: Self-pay | Admitting: *Deleted

## 2020-06-04 DIAGNOSIS — M542 Cervicalgia: Secondary | ICD-10-CM

## 2020-06-04 NOTE — Telephone Encounter (Signed)
-----   Message from Ofilia Neas, PA-C sent at 06/04/2020  4:40 PM EST ----- I called the patient to review C-spine x-ray results from yesterday. All questions were addressed.   She reports she noticed significant improvement in her neck pain after taking the first dose of methocarbamol yesterday afternoon.  She slept much better last night.  She took a dose of methocarbamol this morning which has alleviated the muscle spasm s.  She is tolerating methocarbamol without any side effects. She has tapered prednisone to 5 mg which she will take for the next 6 days.   Discussed a referral to physical therapy.  Please pend order.   She was advised to notify us if her neck pain persists or worsens and we can place an order for a MRI of the C-spine for further evaluation in the future.

## 2020-06-04 NOTE — Progress Notes (Signed)
I called the patient to review C-spine x-ray results from yesterday. All questions were addressed.   She reports she noticed significant improvement in her neck pain after taking the first dose of methocarbamol yesterday afternoon.  She slept much better last night.  She took a dose of methocarbamol this morning which has alleviated the muscle spasms.  She is tolerating methocarbamol without any side effects. She has tapered prednisone to 5 mg which she will take for the next 6 days.   Discussed a referral to physical therapy.  Please pend order.   She was advised to notify us if her neck pain persists or worsens and we can place an order for a MRI of the C-spine for further evaluation in the future.

## 2020-06-15 ENCOUNTER — Other Ambulatory Visit: Payer: Self-pay | Admitting: Physician Assistant

## 2020-06-16 ENCOUNTER — Ambulatory Visit: Payer: Medicare Other | Admitting: Physician Assistant

## 2020-06-18 ENCOUNTER — Ambulatory Visit: Payer: Medicare Other | Admitting: Physician Assistant

## 2020-06-18 ENCOUNTER — Ambulatory Visit: Payer: Medicare Other | Attending: Physician Assistant

## 2020-06-18 ENCOUNTER — Other Ambulatory Visit: Payer: Self-pay

## 2020-06-18 DIAGNOSIS — M62838 Other muscle spasm: Secondary | ICD-10-CM

## 2020-06-18 DIAGNOSIS — R209 Unspecified disturbances of skin sensation: Secondary | ICD-10-CM | POA: Diagnosis present

## 2020-06-18 DIAGNOSIS — M6281 Muscle weakness (generalized): Secondary | ICD-10-CM | POA: Diagnosis present

## 2020-06-18 DIAGNOSIS — R293 Abnormal posture: Secondary | ICD-10-CM | POA: Diagnosis present

## 2020-06-18 DIAGNOSIS — M542 Cervicalgia: Secondary | ICD-10-CM | POA: Diagnosis present

## 2020-06-18 NOTE — Therapy (Signed)
[facility] [address] [place], [place], [address] Phone: [phone]   Fax:  [phone]  Physical Therapy Evaluation  Patient Details  Name: Jean [name] MRN: [id number] Date of Birth: [birth year] Referring Provider (PT): Gearldine Bienenstock, PA-C   Encounter Date: 06/18/2020   PT End of Session - 06/18/20 0912    Visit Number 1    Number of Visits 7    Date for PT Re-Evaluation 08/02/20    Authorization Type [employer] Medicare - FOTO visit 6 and visit 10; PN visit 10; KX modifier visit 15    PT Start Time 0915    PT Stop Time 0959    PT Time Calculation (min) 44 min    Activity Tolerance Patient tolerated treatment well    Behavior During Therapy [facility] for tasks assessed/performed           Past Medical History:  Diagnosis Date  . Arthritis   . Cancer (HCC)   . History of radiation therapy 05/03/19- 06/25/19   Larynx 35 fractions of 2 Gy each to total 70 Gy  . Hypertension   . Rheumatoid arthritis (HCC)   . Vocal cord mass     Past Surgical History:  Procedure Laterality Date  . ECTOPIC PREGNANCY SURGERY    . EXTERNAL EAR SURGERY Right    removal of cyst or gland  . IR GASTROSTOMY TUBE MOD SED  04/26/2019  . IR GASTROSTOMY TUBE REMOVAL  11/05/2019  . IR IMAGING GUIDED PORT INSERTION  04/26/2019  . IR REMOVAL TUN ACCESS W/ PORT W/O FL MOD SED  11/05/2019  . MICROLARYNGOSCOPY Left 03/26/2019   Procedure: DIRECT MICROLARYNGOSCOPY WITH BIOPSY OF VOCAL CORD MASS;  Surgeon: Newman Pies, MD;  Location: [facility];  Service: ENT;  Laterality: Left;    There were no vitals filed for this visit.    Subjective Assessment - 06/18/20 0918    Subjective "It got much better after the robaxin but it stiffened up so quick when I didn't take it since it was feeling better. It hurts as soon as I start to do something, especially when I am using this (left) arm. The last time I took robaxin yesterday morning with breakfast."     Pertinent History RA, hx of cancer, HTN, arthritis    Limitations Sitting;House hold activities    How long can you sit comfortably? "For a few minutes" - that's how bad it was before taking robaxin    How long can you stand comfortably? No issues    How long can you walk comfortably? No issues    Diagnostic tests 06/03/2020 cervical spine: Loss of cervical lordosis was noted.  C5-C6 narrowing with anterior spurring was noted. Facet joint arthropathy was noted. Impression: These findings are consistent with multilevel spondylosis with significant narrowing between C5-C6 and anterior osteophytes.    Patient Stated Goals "Be able to lift my left arm without discomfort"    Currently in Pain? No/denies    Pain Score 0-No pain    Pain Type Acute pain    Pain Radiating Towards Periodically has numbness and tingling in L pinky    Pain Onset More than a month ago    Pain Frequency Intermittent    Aggravating Factors  Increased activity and LUE elevation    Pain Relieving Factors Robaxin, ice    Effect of Pain on Daily Activities Difficulty elevating LUE without comfort  of both hands 01/19/2017  . Primary osteoarthritis of both knees 01/19/2017  . Fatigue 01/19/2017  . Thrombocytosis 01/19/2017  . Primary osteoarthritis of both feet 01/19/2017  . Macrocytic anemia 01/19/2017  . Screening-pulmonary TB 04/26/2016     Rhea Bleacher, PT, DPT 06/18/20 4:59 PM  [facility] [address] [place], [place], [address] Phone: [phone]   Fax:  [phone]  Name: Jean [name] MRN: [id number] Date of Birth: December 31, 1945  of both hands 01/19/2017  . Primary osteoarthritis of both knees 01/19/2017  . Fatigue 01/19/2017  . Thrombocytosis 01/19/2017  . Primary osteoarthritis of both feet 01/19/2017  . Macrocytic anemia 01/19/2017  . Screening-pulmonary TB 04/26/2016     Rhea Bleacher, PT, DPT 06/18/20 4:59 PM  [facility] [address] [place], [place], [address] Phone: [phone]   Fax:  [phone]  Name: Jean [name] MRN: [id number] Date of Birth: December 31, 1945  of both hands 01/19/2017  . Primary osteoarthritis of both knees 01/19/2017  . Fatigue 01/19/2017  . Thrombocytosis 01/19/2017  . Primary osteoarthritis of both feet 01/19/2017  . Macrocytic anemia 01/19/2017  . Screening-pulmonary TB 04/26/2016     Rhea Bleacher, PT, DPT 06/18/20 4:59 PM  [facility] [address] [place], [place], [address] Phone: [phone]   Fax:  [phone]  Name: Jean [name] MRN: [id number] Date of Birth: December 31, 1945  of both hands 01/19/2017  . Primary osteoarthritis of both knees 01/19/2017  . Fatigue 01/19/2017  . Thrombocytosis 01/19/2017  . Primary osteoarthritis of both feet 01/19/2017  . Macrocytic anemia 01/19/2017  . Screening-pulmonary TB 04/26/2016     Rhea Bleacher, PT, DPT 06/18/20 4:59 PM  [facility] [address] [place], [place], [address] Phone: [phone]   Fax:  [phone]  Name: Jean [name] MRN: [id number] Date of Birth: December 31, 1945

## 2020-06-26 NOTE — Progress Notes (Signed)
Office Visit Note  Patient: Jean [name]             Date of Birth: Jan 27, 1946           MRN: [id number]             PCP: [name], MD Referring: [name], MD Visit Date: 07/09/2020 Occupation: @GUAROCC @  Subjective:  Other (Joint stiffness, bilateral hand, left knee and lower extremity pain/swelling )   History of Present Illness: Jean [name] is a 75 y.o. female with the history of seronegative rheumatoid arthritis.  She was started on leflunomide 10 mg p.o. daily in the last week of November.  The dose was increased to 20 mg p.o. daily 2 weeks later.  She states she has noticed improvement in her symptoms but she still continues to have pain and swelling in her bilateral hands, and bilateral knee joints.  She continues to have some discomfort in her shoulders as well.  She was treated with methotrexate and Humira in the past before the diagnosis of vocal cord cancer.  She was in remission for almost a year and then the symptoms recurred.  Activities of Daily Living:  Patient reports morning stiffness for 1-2 hours.   Patient Reports nocturnal pain.  Difficulty dressing/grooming: Reports Difficulty climbing stairs: Reports Difficulty getting out of chair: Reports Difficulty using hands for taps, buttons, cutlery, and/or writing: Reports  Review of Systems  Constitutional: Positive for fatigue.  HENT: Negative for mouth sores, mouth dryness and nose dryness.   Eyes: Negative for pain, itching and dryness.  Respiratory: Negative for shortness of breath and difficulty breathing.   Cardiovascular: Negative for chest pain and palpitations.  Gastrointestinal: Negative for blood in stool, constipation and diarrhea.  Endocrine: Negative for increased urination.  Genitourinary: Negative for difficulty urinating.  Musculoskeletal: Positive for arthralgias, joint pain, joint swelling, myalgias, morning stiffness, muscle tenderness and myalgias.  Skin: Negative  for color change, rash and redness.  Allergic/Immunologic: Negative for susceptible to infections.  Neurological: Positive for weakness. Negative for dizziness, numbness, headaches and memory loss.  Hematological: Negative for bruising/bleeding tendency.  Psychiatric/Behavioral: Negative for confusion.    PMFS History:  Patient Active Problem List   Diagnosis Date Noted  . Preventive measure 05/20/2020  . MGUS (monoclonal gammopathy of unknown significance) 12/25/2019  . Acquired hypothyroidism 12/25/2019  . Protein malnutrition (HCC) 05/16/2019  . Constipation due to opioid therapy 05/09/2019  . Port-A-Cath in place 05/02/2019  . 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 Vitamin D deficiency 01/23/2017  . Rheumatoid arthritis, seronegative, multiple sites (HCC) 01/19/2017  . High risk medication use 01/19/2017  . Primary osteoarthritis of both hands 01/19/2017  . Primary osteoarthritis of both knees 01/19/2017  . Fatigue 01/19/2017  . Thrombocytosis 01/19/2017  . Primary osteoarthritis of both feet 01/19/2017  . Macrocytic anemia 01/19/2017  . Screening-pulmonary TB 04/26/2016    Past Medical History:  Diagnosis Date  . Arthritis   . Cancer (HCC)   . History of radiation therapy 05/03/19- 06/25/19   Larynx 35 fractions of 2 Gy each to total 70 Gy  . Hypertension   . Rheumatoid arthritis (HCC)   . Vocal cord mass     Family History  Problem Relation Age of Onset  . Stroke Mother   . Hypertension Daughter    Past Surgical History:  Procedure Laterality Date  . ECTOPIC PREGNANCY SURGERY    . EXTERNAL EAR SURGERY Right    removal of  cyst or gland  . IR GASTROSTOMY TUBE MOD SED  04/26/2019  . IR GASTROSTOMY TUBE REMOVAL  11/05/2019  . IR IMAGING GUIDED PORT INSERTION  04/26/2019  . IR REMOVAL TUN ACCESS W/ PORT W/O FL MOD SED  11/05/2019  . MICROLARYNGOSCOPY Left 03/26/2019   Procedure: DIRECT MICROLARYNGOSCOPY WITH BIOPSY OF VOCAL CORD MASS;  Surgeon:  Newman Pies, MD;  Location: [facility];  Service: ENT;  Laterality: Left;   Social History   Social History Narrative   Patient was recently widowed in March 2020.   Patient has 1 daughter who lives in [place]   Patient moved to [place] from [place] in January 2009.   Patient is a retired [employer] fund[employer].   Immunization History  Administered Date(s) Administered  . Fluad Quad(high Dose 65+) 05/20/2020  . Influenza, High Dose Seasonal PF 06/09/2017  . PFIZER(Purple Top)SARS-COV-2 Vaccination 08/29/2019, 09/19/2019     Objective: Vital Signs: BP 104/69 (BP Location: Right Arm, Patient Position: Sitting, Cuff Size: Normal)   Pulse (!) 108   Resp 13   Ht 5\' 3"  (1.6 m)   Wt 115 lb 6.4 oz (52.3 kg)   BMI 20.44 kg/m    Physical Exam Vitals and nursing note reviewed.  Constitutional:      Appearance: She is well-developed and well-nourished.  HENT:     Head: Normocephalic and atraumatic.  Eyes:     Extraocular Movements: EOM normal.     Conjunctiva/sclera: Conjunctivae normal.  Cardiovascular:     Rate and Rhythm: Normal rate and regular rhythm.     Pulses: Intact distal pulses.     Heart sounds: Normal heart sounds.  Pulmonary:     Effort: Pulmonary effort is normal.     Breath sounds: Normal breath sounds.  Abdominal:     General: Bowel sounds are normal.     Palpations: Abdomen is soft.  Musculoskeletal:     Cervical back: Normal range of motion.  Lymphadenopathy:     Cervical: No cervical adenopathy.  Skin:    General: Skin is warm and dry.     Capillary Refill: Capillary refill takes less than 2 seconds.  Neurological:     Mental Status: She is alert and oriented to person, place, and time.  Psychiatric:        Mood and Affect: Mood and affect normal.        Behavior: Behavior normal.      Musculoskeletal Exam: C-spine was in good range of motion.  She had discomfort range of motion of her shoulder joints.  Elbow joints with good range of  motion with no synovitis.  She has synovitis of bilateral wrist joints and several of her PIPs as described below.  She had good range of motion of her hip joints.  She had warmth and swelling in her knee joints.  There was no tenderness over ankles or MTPs.  CDAI Exam: CDAI Score: 25.4  Patient Global: 7 mm; Provider Global: 7 mm Swollen: 11 ; Tender: 13  Joint Exam 07/09/2020      Right  Left  Glenohumeral   Tender   Tender  Wrist  Swollen Tender  Swollen Tender  MCP 2  Swollen Tender     MCP 3  Swollen Tender     PIP 2  Swollen Tender  Swollen Tender  PIP 3  Swollen Tender  Swollen Tender  PIP 4  Swollen Tender     Knee  Swollen Tender  Swollen Tender  Investigation: No additional findings.  Imaging: No results found.  Recent Labs: Lab Results  Component Value Date   WBC 6.1 07/08/2020   HGB 10.3 (L) 07/08/2020   PLT 461 (H) 07/08/2020   NA 137 07/08/2020   K 4.2 07/08/2020   CL 102 07/08/2020   CO2 26 07/08/2020   GLUCOSE 99 07/08/2020   BUN 17 07/08/2020   CREATININE 0.93 07/08/2020   BILITOT 0.3 07/08/2020   ALKPHOS 79 05/13/2020   AST 14 07/08/2020   ALT 7 07/08/2020   PROT 6.5 07/08/2020   ALBUMIN 3.4 (L) 05/13/2020   CALCIUM 9.2 07/08/2020   GFRAA 71 07/08/2020   QFTBGOLDPLUS NEGATIVE 01/15/2019    Speciality Comments: PPD- 07/08/17 Negative  Procedures:  No procedures performed Allergies: Sulfa antibiotics   Assessment / Plan:     Visit Diagnoses: Rheumatoid arthritis, seronegative, multiple sites (HCC)-patient has no cyst some improvement on leflunomide but she continues to have pain and swelling in multiple joints.  She complains of discomfort in her bilateral shoulders, bilateral hands and her knees.  She states she is very stiff and swollen in the morning which gets better during the daytime.  She did better while she was on prednisone taper.  She does not want to take more prednisone.  She had very good response to methotrexate and Humira  combination in the past which was discontinued after the vocal cord cancer.  She was in remission for almost a year with recurrence of symptoms.  We had detailed discussion regarding future therapy.  I would like permission from Dr. Bertis Ruddy to restart on Humira or any other biologic therapy.  Once we get approval we can start her on the treatment as soon as possible  High risk medication use - Arava 20 mg 1 tablet by mouth daily, D/c MTX 8 tab/wk-elevated creatinine-1.07 on 05/09/20, GFR 60. humira-d/c after starting radiation and chemotherapy  Primary osteoarthritis of both hands-she has some underlying osteoarthritis but currently she has significant swelling which is causing discomfort.  Primary osteoarthritis of both knees-she is having increased pain and swelling in her bilateral knee joints and difficulty walking.  Primary osteoarthritis of both feet-she does not have much discomfort in her feet.  Neck pain - methocarbamol 500 mg twice daily as needed for muscle spasms  Age-related osteoporosis without current pathological fracture - Nov 02, 2019 DEXA showed T score of -3.4 in the lumbar region.  She has been tolerating Fosamax.  She will need repeat DEXA in May 2023.  History of vitamin D deficiency-she is on vitamin D supplement.  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 Bertis Ruddy and sees ENT.  Thrombocytosis-most likely due to anemia.  History of anemia  Abnormal SPEP-followed by oncology.  Educated about COVID-19 virus infection-she is fully vaccinated against COVID-19.  Have advised her to get a booster.  Instructions were placed in the AVS.  Use of mask, social distancing and hand hygiene was discussed.  Orders: No orders of the defined types were placed in this encounter.  No orders of the defined types were placed in this encounter.    Follow-Up Instructions: Return in about 6 weeks (around 08/20/2020) for Rheumatoid  arthritis.   Pollyann Savoy, MD  Note - This record has been created using Animal nutritionist.  Chart creation errors have been sought, but may not always  have been located. Such creation errors do not reflect on  the standard of medical care.

## 2020-06-28 ENCOUNTER — Other Ambulatory Visit: Payer: Self-pay | Admitting: Rheumatology

## 2020-06-30 NOTE — Telephone Encounter (Signed)
Last Visit: 06/03/2020 Next Visit: 07/09/2020 Labs: 05/29/2020, CBC and CMP are stable. Current Dose per office note 06/03/2020, She has been tolerating Fosamax, does not discussed. DX: Rheumatoid arthritis, seronegative, multiple sites  Okay to refill Fosamax ?

## 2020-07-08 ENCOUNTER — Other Ambulatory Visit: Payer: Self-pay | Admitting: *Deleted

## 2020-07-08 DIAGNOSIS — Z79899 Other long term (current) drug therapy: Secondary | ICD-10-CM

## 2020-07-08 LAB — CBC WITH DIFFERENTIAL/PLATELET
Absolute Monocytes: 458 cells/uL (ref 200–950)
Basophils Absolute: 31 cells/uL (ref 0–200)
Basophils Relative: 0.5 %
Eosinophils Absolute: 12 cells/uL — ABNORMAL LOW (ref 15–500)
Eosinophils Relative: 0.2 %
HCT: 31.9 % — ABNORMAL LOW (ref 35.0–45.0)
Hemoglobin: 10.3 g/dL — ABNORMAL LOW (ref 11.7–15.5)
Lymphs Abs: 628 cells/uL — ABNORMAL LOW (ref 850–3900)
MCH: 30.5 pg (ref 27.0–33.0)
MCHC: 32.3 g/dL (ref 32.0–36.0)
MCV: 94.4 fL (ref 80.0–100.0)
MPV: 9.3 fL (ref 7.5–12.5)
Monocytes Relative: 7.5 %
Neutro Abs: 4972 cells/uL (ref 1500–7800)
Neutrophils Relative %: 81.5 %
Platelets: 461 10*3/uL — ABNORMAL HIGH (ref 140–400)
RBC: 3.38 10*6/uL — ABNORMAL LOW (ref 3.80–5.10)
RDW: 13.3 % (ref 11.0–15.0)
Total Lymphocyte: 10.3 %
WBC: 6.1 10*3/uL (ref 3.8–10.8)

## 2020-07-08 LAB — COMPLETE METABOLIC PANEL WITH GFR
AG Ratio: 1.5 (calc) (ref 1.0–2.5)
ALT: 7 U/L (ref 6–29)
AST: 14 U/L (ref 10–35)
Albumin: 3.9 g/dL (ref 3.6–5.1)
Alkaline phosphatase (APISO): 69 U/L (ref 37–153)
BUN: 17 mg/dL (ref 7–25)
CO2: 26 mmol/L (ref 20–32)
Calcium: 9.2 mg/dL (ref 8.6–10.4)
Chloride: 102 mmol/L (ref 98–110)
Creat: 0.93 mg/dL (ref 0.60–0.93)
GFR, Est African American: 71 mL/min/{1.73_m2} (ref >=60)
GFR, Est Non African American: 61 mL/min/{1.73_m2} (ref >=60)
Globulin: 2.6 g/dL (calc) (ref 1.9–3.7)
Glucose, Bld: 99 mg/dL (ref 65–99)
Potassium: 4.2 mmol/L (ref 3.5–5.3)
Sodium: 137 mmol/L (ref 135–146)
Total Bilirubin: 0.3 mg/dL (ref 0.2–1.2)
Total Protein: 6.5 g/dL (ref 6.1–8.1)

## 2020-07-09 ENCOUNTER — Other Ambulatory Visit: Payer: Self-pay

## 2020-07-09 ENCOUNTER — Encounter: Payer: Self-pay | Admitting: Rheumatology

## 2020-07-09 ENCOUNTER — Ambulatory Visit: Payer: Medicare Other | Attending: Physician Assistant

## 2020-07-09 ENCOUNTER — Ambulatory Visit: Payer: Medicare Other | Admitting: Rheumatology

## 2020-07-09 ENCOUNTER — Telehealth: Payer: Self-pay | Admitting: Pharmacist

## 2020-07-09 VITALS — BP 104/69 | HR 108 | Resp 13 | Ht 63.0 in | Wt 115.4 lb

## 2020-07-09 DIAGNOSIS — M6281 Muscle weakness (generalized): Secondary | ICD-10-CM | POA: Insufficient documentation

## 2020-07-09 DIAGNOSIS — M19041 Primary osteoarthritis, right hand: Secondary | ICD-10-CM | POA: Diagnosis not present

## 2020-07-09 DIAGNOSIS — M0609 Rheumatoid arthritis without rheumatoid factor, multiple sites: Secondary | ICD-10-CM | POA: Diagnosis not present

## 2020-07-09 DIAGNOSIS — Z862 Personal history of diseases of the blood and blood-forming organs and certain disorders involving the immune mechanism: Secondary | ICD-10-CM

## 2020-07-09 DIAGNOSIS — M19072 Primary osteoarthritis, left ankle and foot: Secondary | ICD-10-CM

## 2020-07-09 DIAGNOSIS — M170 Bilateral primary osteoarthritis of knee: Secondary | ICD-10-CM

## 2020-07-09 DIAGNOSIS — M19071 Primary osteoarthritis, right ankle and foot: Secondary | ICD-10-CM

## 2020-07-09 DIAGNOSIS — M542 Cervicalgia: Secondary | ICD-10-CM | POA: Insufficient documentation

## 2020-07-09 DIAGNOSIS — R778 Other specified abnormalities of plasma proteins: Secondary | ICD-10-CM

## 2020-07-09 DIAGNOSIS — M810 Age-related osteoporosis without current pathological fracture: Secondary | ICD-10-CM

## 2020-07-09 DIAGNOSIS — R293 Abnormal posture: Secondary | ICD-10-CM | POA: Diagnosis present

## 2020-07-09 DIAGNOSIS — Z7189 Other specified counseling: Secondary | ICD-10-CM

## 2020-07-09 DIAGNOSIS — C320 Malignant neoplasm of glottis: Secondary | ICD-10-CM

## 2020-07-09 DIAGNOSIS — Z79899 Other long term (current) drug therapy: Secondary | ICD-10-CM | POA: Diagnosis not present

## 2020-07-09 DIAGNOSIS — D75839 Thrombocytosis, unspecified: Secondary | ICD-10-CM

## 2020-07-09 DIAGNOSIS — Z8639 Personal history of other endocrine, nutritional and metabolic disease: Secondary | ICD-10-CM

## 2020-07-09 DIAGNOSIS — M19042 Primary osteoarthritis, left hand: Secondary | ICD-10-CM

## 2020-07-09 DIAGNOSIS — Z780 Asymptomatic menopausal state: Secondary | ICD-10-CM

## 2020-07-09 NOTE — Telephone Encounter (Signed)
Received notification from [employer] regarding a prior authorization for [place]. Authorization has been APPROVED from 06/21/20 to 06/20/21.   Authorization # [id number]   Patient's copay through insurance is [phone] for 1 month supply. Patient will need to re-enroll for [employer].

## 2020-07-09 NOTE — Telephone Encounter (Signed)
Submitted a Prior Authorization request to [facility] for [place] via Cover My Meds. Will update once we receive a response.  Key: [id number] - PA Case ID: [id number]   Called patient to see if she received an [place] [employer] application in office. She said she did not but can stop by the office tomorrow to pick one up.  Can you please place an [employer] Assist application at the front for the patient?

## 2020-07-09 NOTE — Telephone Encounter (Signed)
Left patient assistance form at front for patient.

## 2020-07-09 NOTE — Telephone Encounter (Signed)
Please start Humira BIV.  Dx: M06.09  Dose: 40mg  every 14 days  Prior therapy: D/c MTX elevated creatinine-1.07 on 05/09/20; Humira was stopped previously d/t radiation and chemotherapy  Knox Saliva, PharmD, MPH Clinical Pharmacist (Rheumatology and Pulmonology)

## 2020-07-09 NOTE — Therapy (Signed)
[facility] [address] [place], [place], [address] Phone: [phone]   Fax:  [phone]  Physical Therapy Treatment  Patient Details  Name: Jean [name] MRN: [id number] Date of Birth: 09/23/1945 Referring Provider (PT): Gearldine Bienenstock, PA-C   Encounter Date: 07/09/2020   PT End of Session - 07/09/20 1314    Visit Number 2    Number of Visits 7    Date for PT Re-Evaluation 08/02/20    Authorization Type UHC Medicare - FOTO visit 6 and visit 10; PN visit 10; KX modifier visit 15    PT Start Time 1235    PT Stop Time 1315    PT Time Calculation (min) 40 min    Activity Tolerance Patient tolerated treatment well    Behavior During Therapy [facility] for tasks assessed/performed           Past Medical History:  Diagnosis Date  . Arthritis   . Cancer (HCC)   . History of radiation therapy 05/03/19- 06/25/19   Larynx 35 fractions of 2 Gy each to total 70 Gy  . Hypertension   . Rheumatoid arthritis (HCC)   . Vocal cord mass     Past Surgical History:  Procedure Laterality Date  . ECTOPIC PREGNANCY SURGERY    . EXTERNAL EAR SURGERY Right    removal of cyst or gland  . IR GASTROSTOMY TUBE MOD SED  04/26/2019  . IR GASTROSTOMY TUBE REMOVAL  11/05/2019  . IR IMAGING GUIDED PORT INSERTION  04/26/2019  . IR REMOVAL TUN ACCESS W/ PORT W/O FL MOD SED  11/05/2019  . MICROLARYNGOSCOPY Left 03/26/2019   Procedure: DIRECT MICROLARYNGOSCOPY WITH BIOPSY OF VOCAL CORD MASS;  Surgeon: Newman Pies, MD;  Location: [facility];  Service: ENT;  Laterality: Left;    There were no vitals filed for this visit.   Subjective Assessment - 07/09/20 1235    Subjective Patient reports she is doing pretty good, but her neck is still stiff. She is not having spasms anymore and hasn't had to take muscle relaxer.    Pertinent History RA, hx of cancer, HTN, arthritis    Currently in Pain? No/denies              [facility] PT Assessment -  07/09/20 0001      AROM   Cervical Flexion 70    Cervical Extension 55    Cervical - Right Side Bend 40    Cervical - Left Side Bend 35    Cervical - Right Rotation 65    Cervical - Left Rotation 69      Strength   Right Shoulder Flexion 4-/5    Right Shoulder ABduction 4/5    Right Shoulder Internal Rotation 4/5    Right Shoulder External Rotation 4-/5    Left Shoulder Flexion 4-/5    Left Shoulder ABduction 4/5    Left Shoulder Internal Rotation 4/5    Left Shoulder External Rotation 4-/5                         [facility] Adult PT Treatment/Exercise - 07/09/20 0001      Self-Care   Self-Care Other Self-Care Comments    Other Self-Care Comments  see patient education      Neck Exercises: Standing   Other Standing Exercises shoulder extension red TB 2 x 10      Neck Exercises: Seated   Other Seated Exercise bilateral shoulder ER  yellow TB 2 x 10    Other Seated Exercise scapular retraction 1 x 10      Neck Exercises: Supine   Neck Retraction Limitations 2 x 10    Shoulder Abduction Limitations horizontal abduction yellow TB      Manual Therapy   Manual therapy comments STM bilteral upper trap, levator scapulae      Neck Exercises: Stretches   Upper Trapezius Stretch Right;Left;30 seconds    Levator Stretch Right;Left;30 seconds                  PT Education - 07/09/20 1311    Education Details HEP reviewed. Issued tennis ball for patinet to complete self STM.    Person(s) Educated Patient    Methods Explanation;Demonstration;Handout;Verbal cues    Comprehension Verbalized understanding;Returned demonstration;Verbal cues required            PT Short Term Goals - 07/09/20 1245      PT SHORT TERM GOAL #1   Title Patient will be independent with initial HEP.    Baseline Pt provided with initial HEP at evaluation 06/18/2020.    Time 3    Period Weeks    Status On-going    Target Date 07/09/20      PT SHORT TERM GOAL #2   Title Patient  will be able to don jacket and reach overhead behind back (L shoulder ER) with </= 2/10 pain.    Baseline Increased pain with L shoulder activity    Time 3    Period Weeks    Status Deferred    Target Date 07/09/20      PT SHORT TERM GOAL #3   Title Patient will increase B shoulder MMT to at least 4/5 grossly.    Baseline 4-/5    Time 3    Period Weeks    Status Partially Met    Target Date 07/09/20      PT SHORT TERM GOAL #4   Title Pt will improve cervical B SB to at least 40 degrees and B ROT to at least 70 degrees without significant pain.    Baseline Cervical SB: R 32 deg; L 30 deg. ROT: R 53 deg; L 60 deg    Time 3    Period Weeks    Status Partially Met    Target Date 07/09/20             PT Long Term Goals - 06/18/20 1652      PT LONG TERM GOAL #1   Title Patient will be independent with advanced HEP.    Baseline Pt provided with initial HEP at evaluation 06/18/2020.    Time 6    Period Weeks    Status New    Target Date 07/30/20      PT LONG TERM GOAL #2   Title Patient will be able to don jacket and reach overhead behind back (L shoulder ER) with 0/10 pain.    Baseline Increased pain with L shoulder activity    Time 6    Period Weeks    Status New    Target Date 07/30/20      PT LONG TERM GOAL #3   Title Patient will increase B shoulder MMT to 5/5 grossly.    Baseline 4-/5    Time 6    Period Weeks    Status New    Target Date 07/30/20      PT LONG TERM GOAL #4   Title Pt will  be able to perform cervical AROM WFL in all planes without increase in pain.    Baseline Pt will AROM limitations and pain in L neck and shoulder    Time 6    Period Weeks    Status New    Target Date 07/30/20      PT LONG TERM GOAL #5   Title Patient's FOTO score will improve from 53% function to 67% function to demonstrated increased perceived ability.    Baseline 53% function; predicted 67% function    Time 6    Period Weeks    Status New    Target Date 07/30/20                  Plan - 07/09/20 1232    Clinical Impression Statement Patient returns for first f/u after initial evaluation on 06/18/20 demonstrating improvements in cervical ROM and shoulder strength. Moderate tautness and palpable tenderness about bilateral upper traps with partial release from manual therapy. Review of HEP issued at initial evaluation with patient requiring heavy verbal cues for proper completion of levator scapulae stretching and cervical retraction with ability to properly perform once cued. Able to progress periscapular strengthening with patient requiring postural cues intermittently.    Personal Factors and Comorbidities Age;Comorbidity 3+    Comorbidities RA, hx of cancer, HTN, arthritis    Examination-Activity Limitations Reach Overhead;Lift;Carry;Caring for Others    Examination-Participation Restrictions Cleaning;Community Activity;Meal Prep;Shop    Stability/Clinical Decision Making Evolving/Moderate complexity    Rehab Potential Good    PT Frequency 1x / week    PT Duration 6 weeks    PT Treatment/Interventions ADLs/Self Care Home Management;Aquatic Therapy;Electrical Stimulation;Cryotherapy;Iontophoresis 4mg /ml Dexamethasone;Moist Heat;Traction;Neuromuscular re-education;Therapeutic activities;Therapeutic exercise;Manual techniques;Patient/family education;Dry needling;Passive range of motion;Taping    PT Next Visit Plan update HEP. periscapular strengthening.    PT Home Exercise Plan [id number] - upper trap stretch, levator scap stretch, cervical retraction, scapular retraction    Consulted and Agree with Plan of Care Patient           Patient will benefit from skilled therapeutic intervention in order to improve the following deficits and impairments:  Decreased activity tolerance,Decreased mobility,Decreased strength,Postural dysfunction,Improper body mechanics,Pain,Decreased endurance,Decreased range of motion,Increased muscle spasms,Impaired UE  functional use  Visit Diagnosis: Cervicalgia  Abnormal posture  Muscle weakness (generalized)     Problem List Patient Active Problem List   Diagnosis Date Noted  . Preventive measure 05/20/2020  . MGUS (monoclonal gammopathy of unknown significance) 12/25/2019  . Acquired hypothyroidism 12/25/2019  . Protein malnutrition (HCC) 05/16/2019  . Constipation due to opioid therapy 05/09/2019  . Port-A-Cath in place 05/02/2019  . 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 Vitamin D deficiency 01/23/2017  . Rheumatoid arthritis, seronegative, multiple sites (HCC) 01/19/2017  . High risk medication use 01/19/2017  . Primary osteoarthritis of both hands 01/19/2017  . Primary osteoarthritis of both knees 01/19/2017  . Fatigue 01/19/2017  . Thrombocytosis 01/19/2017  . Primary osteoarthritis of both feet 01/19/2017  . Macrocytic anemia 01/19/2017  . Screening-pulmonary TB 04/26/2016   Letitia Libra, PT, DPT, ATC 07/09/20 1:19 PM [facility] [address] [place], [place], [address] Phone: [phone]   Fax:  [phone]  Name: DANAYSIA SCAVONE MRN: [id number] Date of Birth: 01-31-1946

## 2020-07-09 NOTE — Patient Instructions (Signed)
Standing Labs We placed an order today for your standing lab work.   Please have your standing labs drawn in April and every 3 months  If possible, please have your labs drawn 2 weeks prior to your appointment so that the provider can discuss your results at your appointment.  We have open lab daily Monday through Thursday from 8:30-12:30 PM and 1:30-4:30 PM and Friday from 8:30-12:30 PM and 1:30-4:00 PM at the office of Dr. Bo Merino, [facility].   Please be advised, patients with office appointments requiring lab work will take precedents over walk-in lab work.  If possible, please come for your lab work on Monday and Friday afternoons, as you may experience shorter wait times. The office is located at [address], [place], [place], [place] [address] No appointment is necessary.   Labs are drawn by [facility]. Please bring your co-pay at the time of your lab draw.  You may receive a bill from [facility] for your lab work.  If you wish to have your labs drawn at another location, please call the office 24 hours in advance to send orders.  If you have any questions regarding directions or hours of operation,  please call [phone].   As a reminder, please drink plenty of water prior to coming for your lab work. Thanks!  COVID-19 vaccine recommendations:   COVID-19 vaccine is recommended for everyone (unless you are allergic to a vaccine component), even if you are on a medication that suppresses your immune system.   If you are on Methotrexate, Cellcept (mycophenolate), Rinvoq, Morrie Sheldon, and Olumiant- hold the medication for 1 week after each vaccine. Hold Methotrexate for 2 weeks after the single dose COVID-19 vaccine.   If you are on Orencia subcutaneous injection - hold medication one week prior to and one week after the first COVID-19 vaccine dose (only).   If you are on Orencia IV infusions- time vaccination administration so that the first COVID-19 vaccination  will occur four weeks after the infusion and postpone the subsequent infusion by one week.   If you are on Cyclophosphamide or Rituxan infusions please contact your doctor prior to receiving the COVID-19 vaccine.   Do not take Tylenol or any anti-inflammatory medications (NSAIDs) 24 hours prior to the COVID-19 vaccination.   There is no direct evidence about the efficacy of the COVID-19 vaccine in individuals who are on medications that suppress the immune system.   Even if you are fully vaccinated, and you are on any medications that suppress your immune system, please continue to wear a mask, maintain at least six feet social distance and practice hand hygiene.   If you develop a COVID-19 infection, please contact your PCP or our office to determine if you need monoclonal antibody infusion.  The booster vaccine is now available for immunocompromised patients.   Please see the following web sites for updated information.   [email or web address]

## 2020-07-16 ENCOUNTER — Other Ambulatory Visit: Payer: Self-pay

## 2020-07-16 ENCOUNTER — Ambulatory Visit: Payer: Medicare Other

## 2020-07-16 DIAGNOSIS — M6281 Muscle weakness (generalized): Secondary | ICD-10-CM

## 2020-07-16 DIAGNOSIS — M542 Cervicalgia: Secondary | ICD-10-CM

## 2020-07-16 DIAGNOSIS — R293 Abnormal posture: Secondary | ICD-10-CM

## 2020-07-16 NOTE — Telephone Encounter (Addendum)
Received application from [employer] for [name] patient assistance. Patient has signed her portion and provided income documents. Attached documents: patient signed portion, income docs, insurance copy, med list, and prior Academic librarian. Faxed to [facility].  Fax: [phone] Phone: [phone]

## 2020-07-16 NOTE — Therapy (Signed)
a 4/10. Patient has difficulty performing cervical retraction as she has tendency to complete cervical extension. With continued practice and cueing patient able to properly perform.    Personal Factors and Comorbidities Age;Comorbidity 3+    Comorbidities RA, hx of cancer, HTN, arthritis    Examination-Activity Limitations Reach Overhead;Lift;Carry;Caring for Others    Examination-Participation Restrictions Cleaning;Community Activity;Meal Prep;Shop    Stability/Clinical Decision Making  Evolving/Moderate complexity    Rehab Potential Good    PT Frequency 1x / week    PT Duration 6 weeks    PT Treatment/Interventions ADLs/Self Care Home Management;Aquatic Therapy;Electrical Stimulation;Cryotherapy;Iontophoresis 76m/ml Dexamethasone;Moist Heat;Traction;Neuromuscular re-education;Therapeutic activities;Therapeutic exercise;Manual techniques;Patient/family education;Dry needling;Passive range of motion;Taping    PT Next Visit Plan review cervical retraction periscapular strengthening. manual therapy prn.    PT Home Exercise Plan [id number] - upper trap stretch, levator scap stretch, cervical retraction, scapular retraction    Consulted and Agree with Plan of Care Patient           Patient will benefit from skilled therapeutic intervention in order to improve the following deficits and impairments:  Decreased activity tolerance,Decreased mobility,Decreased strength,Postural dysfunction,Improper body mechanics,Pain,Decreased endurance,Decreased range of motion,Increased muscle spasms,Impaired UE functional use  Visit Diagnosis: Cervicalgia  Abnormal posture  Muscle weakness (generalized)     Problem List Patient Active Problem List   Diagnosis Date Noted  . Preventive measure 05/20/2020  . MGUS (monoclonal gammopathy of unknown significance) 12/25/2019  . Acquired hypothyroidism 12/25/2019  . Protein malnutrition ([place] 05/16/2019  . Constipation due to opioid therapy 05/09/2019  . Port-A-Cath in place 05/02/2019  . Squamous cell carcinoma of left vocal cord ([place] 04/06/2019  . Vitamin D deficiency 01/23/2017  . Rheumatoid arthritis, seronegative, multiple sites ([place] 01/19/2017  . High risk medication use 01/19/2017  . Primary osteoarthritis of both hands 01/19/2017  . Primary osteoarthritis of both knees 01/19/2017  . Fatigue 01/19/2017  . Thrombocytosis 01/19/2017  . Primary osteoarthritis of both feet 01/19/2017  . Macrocytic anemia 01/19/2017  .  Screening-pulmonary TB 04/26/2016   SGwendolyn Grant PT, DPT, ATC 07/16/20 11:08 AM  [facility]GWinth[address] [place] [address]Phone: [phone]  Fax:  [phone] Name: Jean EUTSLERMRN: [id number] of Birth: 9Apr 19, 1947  a 4/10. Patient has difficulty performing cervical retraction as she has tendency to complete cervical extension. With continued practice and cueing patient able to properly perform.    Personal Factors and Comorbidities Age;Comorbidity 3+    Comorbidities RA, hx of cancer, HTN, arthritis    Examination-Activity Limitations Reach Overhead;Lift;Carry;Caring for Others    Examination-Participation Restrictions Cleaning;Community Activity;Meal Prep;Shop    Stability/Clinical Decision Making  Evolving/Moderate complexity    Rehab Potential Good    PT Frequency 1x / week    PT Duration 6 weeks    PT Treatment/Interventions ADLs/Self Care Home Management;Aquatic Therapy;Electrical Stimulation;Cryotherapy;Iontophoresis 76m/ml Dexamethasone;Moist Heat;Traction;Neuromuscular re-education;Therapeutic activities;Therapeutic exercise;Manual techniques;Patient/family education;Dry needling;Passive range of motion;Taping    PT Next Visit Plan review cervical retraction periscapular strengthening. manual therapy prn.    PT Home Exercise Plan [id number] - upper trap stretch, levator scap stretch, cervical retraction, scapular retraction    Consulted and Agree with Plan of Care Patient           Patient will benefit from skilled therapeutic intervention in order to improve the following deficits and impairments:  Decreased activity tolerance,Decreased mobility,Decreased strength,Postural dysfunction,Improper body mechanics,Pain,Decreased endurance,Decreased range of motion,Increased muscle spasms,Impaired UE functional use  Visit Diagnosis: Cervicalgia  Abnormal posture  Muscle weakness (generalized)     Problem List Patient Active Problem List   Diagnosis Date Noted  . Preventive measure 05/20/2020  . MGUS (monoclonal gammopathy of unknown significance) 12/25/2019  . Acquired hypothyroidism 12/25/2019  . Protein malnutrition ([place] 05/16/2019  . Constipation due to opioid therapy 05/09/2019  . Port-A-Cath in place 05/02/2019  . Squamous cell carcinoma of left vocal cord ([place] 04/06/2019  . Vitamin D deficiency 01/23/2017  . Rheumatoid arthritis, seronegative, multiple sites ([place] 01/19/2017  . High risk medication use 01/19/2017  . Primary osteoarthritis of both hands 01/19/2017  . Primary osteoarthritis of both knees 01/19/2017  . Fatigue 01/19/2017  . Thrombocytosis 01/19/2017  . Primary osteoarthritis of both feet 01/19/2017  . Macrocytic anemia 01/19/2017  .  Screening-pulmonary TB 04/26/2016   SGwendolyn Grant PT, DPT, ATC 07/16/20 11:08 AM  [facility]GWinth[address] [place] [address]Phone: [phone]  Fax:  [phone] Name: Jean EUTSLERMRN: [id number] of Birth: 9Apr 19, 1947  [place], [place], [address] Phone: [phone]   Fax:  [phone]  Physical Therapy Treatment  Patient Details  Name: Jean [name] MRN: [id number] Date of Birth: 16-Aug-1945 Referring Provider (PT): Ofilia Neas, PA-C   Encounter Date: 07/16/2020   PT End of Session - 07/16/20 1021    Visit Number 3    Number of Visits 7    Date for PT Re-Evaluation 08/02/20    Authorization Type UHC Medicare - FOTO visit 6 and visit 10; PN visit 10; KX modifier visit 15    PT Start Time 1017    PT Stop Time 1059    PT Time Calculation (min) 42 min    Activity Tolerance Patient tolerated treatment well    Behavior During Therapy [facility] for tasks assessed/performed           Past Medical History:  Diagnosis Date  . Arthritis   . Cancer ([place])   . History of radiation therapy 05/03/19- 06/25/19   Larynx 35 fractions of 2 Gy each to total 70 Gy  . Hypertension   . Rheumatoid arthritis ([place])   . Vocal cord mass     Past Surgical History:  Procedure Laterality Date  . ECTOPIC PREGNANCY SURGERY    . EXTERNAL EAR SURGERY Right    removal of cyst or gland  . IR GASTROSTOMY TUBE MOD SED  04/26/2019  . IR GASTROSTOMY TUBE REMOVAL  11/05/2019  . IR IMAGING GUIDED PORT INSERTION  04/26/2019  . IR REMOVAL TUN ACCESS W/ PORT W/O FL MOD SED  11/05/2019  . MICROLARYNGOSCOPY Left 03/26/2019   Procedure: DIRECT MICROLARYNGOSCOPY WITH BIOPSY OF VOCAL CORD MASS;  Surgeon: Leta Baptist, MD;  Location: [facility];  Service: ENT;  Laterality: Left;    There were no vitals filed for this visit.   Subjective Assessment - 07/16/20 1019    Subjective "I was feeling good up until Sunday." Patient reports she is now having tightness/grabbing sensation along the Lt side of the neck. She has been completing exercises, using ice/biofreeze, pain medication, but reports none of this has given her relief.    Currently in Pain?  Yes    Pain Score 8     Pain Location Neck    Pain Orientation Left    Pain Descriptors / Indicators Tightness   grabbing   Pain Type Acute pain    Pain Onset More than a month ago    Pain Frequency Constant                             [facility] Adult PT Treatment/Exercise - 07/16/20 0001      Neck Exercises: Machines for Strengthening   UBE (Upper Arm Bike) 2 min each fwd/bwd level 1.5      Neck Exercises: Seated   Neck Retraction Limitations 2 x 10    Other Seated Exercise bilateral shoulder ER yellow TB 2 x 10      Neck Exercises: Supine   Neck Retraction Limitations 2 x 10      Manual Therapy   Soft tissue mobilization STM, TrP release Lt upper trap, levator scapulae, parapsinals, SCM. Suboccipital release    Passive ROM passive stretch bilateral upper trap, levator scapulae                    PT Short Term Goals - 07/09/20 1245

## 2020-07-17 ENCOUNTER — Other Ambulatory Visit: Payer: Self-pay | Admitting: Physician Assistant

## 2020-07-17 NOTE — Telephone Encounter (Signed)
Last Visit: 07/09/2020 Next Visit: 08/20/2020  Current Dose per office note on 07/09/2020, methocarbamol 500 mg twice daily as needed for muscle spasms Dx: Neck pain  Okay to refill Robaxin?

## 2020-07-21 ENCOUNTER — Ambulatory Visit: Payer: Medicare Other

## 2020-07-21 ENCOUNTER — Other Ambulatory Visit: Payer: Self-pay

## 2020-07-21 DIAGNOSIS — M542 Cervicalgia: Secondary | ICD-10-CM | POA: Diagnosis not present

## 2020-07-21 DIAGNOSIS — M6281 Muscle weakness (generalized): Secondary | ICD-10-CM

## 2020-07-21 DIAGNOSIS — R293 Abnormal posture: Secondary | ICD-10-CM

## 2020-07-21 NOTE — Therapy (Signed)
[place], [place], [address] Phone: [phone]   Fax:  [phone]  Physical Therapy Treatment  Patient Details  Name: Jean [name] MRN: [id number] Date of Birth: 27-Jul-1945 Referring Provider (PT): Ofilia Neas, PA-C   Encounter Date: 07/21/2020   PT End of Session - 07/21/20 1356    Visit Number 4    Number of Visits 7    Date for PT Re-Evaluation 08/02/20    Authorization Type UHC Medicare - [place] visit 6 and visit 10; PN visit 10; KX modifier visit 15    PT Start Time 1400    PT Stop Time 1445    PT Time Calculation (min) 45 min    Activity Tolerance Patient tolerated treatment well    Behavior During Therapy [facility] for tasks assessed/performed           Past Medical History:  Diagnosis Date  . Arthritis   . Cancer ([place])   . History of radiation therapy 05/03/19- 06/25/19   Larynx 35 fractions of 2 Gy each to total 70 Gy  . Hypertension   . Rheumatoid arthritis ([place])   . Vocal cord mass     Past Surgical History:  Procedure Laterality Date  . ECTOPIC PREGNANCY SURGERY    . EXTERNAL EAR SURGERY Right    removal of cyst or gland  . IR GASTROSTOMY TUBE MOD SED  04/26/2019  . IR GASTROSTOMY TUBE REMOVAL  11/05/2019  . IR IMAGING GUIDED PORT INSERTION  04/26/2019  . IR REMOVAL TUN ACCESS W/ PORT W/O FL MOD SED  11/05/2019  . MICROLARYNGOSCOPY Left 03/26/2019   Procedure: DIRECT MICROLARYNGOSCOPY WITH BIOPSY OF VOCAL CORD MASS;  Surgeon: Leta Baptist, MD;  Location: [facility];  Service: ENT;  Laterality: Left;    There were no vitals filed for this visit.   Subjective Assessment - 07/21/20 1356    Subjective "When the pain hits, it's about a 7/10. Right now, I'm good. My RA is acting up right now, and it's mostly been in my shoulders. I've been averaging 1 dose of pain medication per day for everything lately."    Pertinent History RA, hx of cancer, HTN, arthritis     Limitations Sitting;House hold activities    How long can you sit comfortably? "For a few minutes" - that's how bad it was before taking robaxin    How long can you stand comfortably? No issues    How long can you walk comfortably? No issues    Diagnostic tests 06/03/2020 cervical spine: Loss of cervical lordosis was noted.  C5-C6 narrowing with anterior spurring was noted. Facet joint arthropathy was noted. Impression: These findings are consistent with multilevel spondylosis with significant narrowing between C5-C6 and anterior osteophytes.    Patient Stated Goals "Be able to lift my left arm without discomfort"    Currently in Pain? Yes    Pain Score 7    "right now I'm good"   Pain Location Neck    Pain Orientation Left    Pain Descriptors / Indicators Tightness;Sore   "grabbing"   Pain Type Acute pain    Pain Onset More than a month ago              [facility] PT Assessment - 07/21/20 0001      Assessment   Medical Diagnosis Neck pain (M54.2)    Referring Provider (PT) Ofilia Neas, PA-C  due to opioid therapy 05/09/2019  . Port-A-Cath in place 05/02/2019  . 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 Vitamin D deficiency 01/23/2017  . Rheumatoid arthritis, seronegative, multiple sites ([place]) 01/19/2017  . High risk medication use 01/19/2017  . Primary osteoarthritis of both hands 01/19/2017  . Primary osteoarthritis of both knees 01/19/2017  . Fatigue 01/19/2017  . Thrombocytosis 01/19/2017  . Primary osteoarthritis of both feet 01/19/2017  . Macrocytic anemia 01/19/2017  .  Screening-pulmonary TB 04/26/2016      Haydee Monica, PT, DPT 07/21/20 2:57 PM  [facility] [address] [place], [place], [address] Phone: [phone]   Fax:  [phone]  Name: Jean [name] MRN: [id number] Date of Birth: 13-Apr-1946  [place], [place], [address] Phone: [phone]   Fax:  [phone]  Physical Therapy Treatment  Patient Details  Name: Jean [name] MRN: [id number] Date of Birth: 27-Jul-1945 Referring Provider (PT): Ofilia Neas, PA-C   Encounter Date: 07/21/2020   PT End of Session - 07/21/20 1356    Visit Number 4    Number of Visits 7    Date for PT Re-Evaluation 08/02/20    Authorization Type UHC Medicare - [place] visit 6 and visit 10; PN visit 10; KX modifier visit 15    PT Start Time 1400    PT Stop Time 1445    PT Time Calculation (min) 45 min    Activity Tolerance Patient tolerated treatment well    Behavior During Therapy [facility] for tasks assessed/performed           Past Medical History:  Diagnosis Date  . Arthritis   . Cancer ([place])   . History of radiation therapy 05/03/19- 06/25/19   Larynx 35 fractions of 2 Gy each to total 70 Gy  . Hypertension   . Rheumatoid arthritis ([place])   . Vocal cord mass     Past Surgical History:  Procedure Laterality Date  . ECTOPIC PREGNANCY SURGERY    . EXTERNAL EAR SURGERY Right    removal of cyst or gland  . IR GASTROSTOMY TUBE MOD SED  04/26/2019  . IR GASTROSTOMY TUBE REMOVAL  11/05/2019  . IR IMAGING GUIDED PORT INSERTION  04/26/2019  . IR REMOVAL TUN ACCESS W/ PORT W/O FL MOD SED  11/05/2019  . MICROLARYNGOSCOPY Left 03/26/2019   Procedure: DIRECT MICROLARYNGOSCOPY WITH BIOPSY OF VOCAL CORD MASS;  Surgeon: Leta Baptist, MD;  Location: [facility];  Service: ENT;  Laterality: Left;    There were no vitals filed for this visit.   Subjective Assessment - 07/21/20 1356    Subjective "When the pain hits, it's about a 7/10. Right now, I'm good. My RA is acting up right now, and it's mostly been in my shoulders. I've been averaging 1 dose of pain medication per day for everything lately."    Pertinent History RA, hx of cancer, HTN, arthritis     Limitations Sitting;House hold activities    How long can you sit comfortably? "For a few minutes" - that's how bad it was before taking robaxin    How long can you stand comfortably? No issues    How long can you walk comfortably? No issues    Diagnostic tests 06/03/2020 cervical spine: Loss of cervical lordosis was noted.  C5-C6 narrowing with anterior spurring was noted. Facet joint arthropathy was noted. Impression: These findings are consistent with multilevel spondylosis with significant narrowing between C5-C6 and anterior osteophytes.    Patient Stated Goals "Be able to lift my left arm without discomfort"    Currently in Pain? Yes    Pain Score 7    "right now I'm good"   Pain Location Neck    Pain Orientation Left    Pain Descriptors / Indicators Tightness;Sore   "grabbing"   Pain Type Acute pain    Pain Onset More than a month ago              [facility] PT Assessment - 07/21/20 0001      Assessment   Medical Diagnosis Neck pain (M54.2)    Referring Provider (PT) Ofilia Neas, PA-C  [place], [place], [address] Phone: [phone]   Fax:  [phone]  Physical Therapy Treatment  Patient Details  Name: Jean [name] MRN: [id number] Date of Birth: 27-Jul-1945 Referring Provider (PT): Ofilia Neas, PA-C   Encounter Date: 07/21/2020   PT End of Session - 07/21/20 1356    Visit Number 4    Number of Visits 7    Date for PT Re-Evaluation 08/02/20    Authorization Type UHC Medicare - [place] visit 6 and visit 10; PN visit 10; KX modifier visit 15    PT Start Time 1400    PT Stop Time 1445    PT Time Calculation (min) 45 min    Activity Tolerance Patient tolerated treatment well    Behavior During Therapy [facility] for tasks assessed/performed           Past Medical History:  Diagnosis Date  . Arthritis   . Cancer ([place])   . History of radiation therapy 05/03/19- 06/25/19   Larynx 35 fractions of 2 Gy each to total 70 Gy  . Hypertension   . Rheumatoid arthritis ([place])   . Vocal cord mass     Past Surgical History:  Procedure Laterality Date  . ECTOPIC PREGNANCY SURGERY    . EXTERNAL EAR SURGERY Right    removal of cyst or gland  . IR GASTROSTOMY TUBE MOD SED  04/26/2019  . IR GASTROSTOMY TUBE REMOVAL  11/05/2019  . IR IMAGING GUIDED PORT INSERTION  04/26/2019  . IR REMOVAL TUN ACCESS W/ PORT W/O FL MOD SED  11/05/2019  . MICROLARYNGOSCOPY Left 03/26/2019   Procedure: DIRECT MICROLARYNGOSCOPY WITH BIOPSY OF VOCAL CORD MASS;  Surgeon: Leta Baptist, MD;  Location: [facility];  Service: ENT;  Laterality: Left;    There were no vitals filed for this visit.   Subjective Assessment - 07/21/20 1356    Subjective "When the pain hits, it's about a 7/10. Right now, I'm good. My RA is acting up right now, and it's mostly been in my shoulders. I've been averaging 1 dose of pain medication per day for everything lately."    Pertinent History RA, hx of cancer, HTN, arthritis     Limitations Sitting;House hold activities    How long can you sit comfortably? "For a few minutes" - that's how bad it was before taking robaxin    How long can you stand comfortably? No issues    How long can you walk comfortably? No issues    Diagnostic tests 06/03/2020 cervical spine: Loss of cervical lordosis was noted.  C5-C6 narrowing with anterior spurring was noted. Facet joint arthropathy was noted. Impression: These findings are consistent with multilevel spondylosis with significant narrowing between C5-C6 and anterior osteophytes.    Patient Stated Goals "Be able to lift my left arm without discomfort"    Currently in Pain? Yes    Pain Score 7    "right now I'm good"   Pain Location Neck    Pain Orientation Left    Pain Descriptors / Indicators Tightness;Sore   "grabbing"   Pain Type Acute pain    Pain Onset More than a month ago              [facility] PT Assessment - 07/21/20 0001      Assessment   Medical Diagnosis Neck pain (M54.2)    Referring Provider (PT) Ofilia Neas, PA-C

## 2020-07-28 ENCOUNTER — Ambulatory Visit: Payer: Medicare Other | Attending: Physician Assistant

## 2020-07-28 ENCOUNTER — Other Ambulatory Visit: Payer: Self-pay

## 2020-07-28 DIAGNOSIS — M6281 Muscle weakness (generalized): Secondary | ICD-10-CM | POA: Diagnosis present

## 2020-07-28 DIAGNOSIS — M542 Cervicalgia: Secondary | ICD-10-CM | POA: Insufficient documentation

## 2020-07-28 DIAGNOSIS — R293 Abnormal posture: Secondary | ICD-10-CM | POA: Insufficient documentation

## 2020-07-28 NOTE — Therapy (Addendum)
Care Home Management;Aquatic Therapy;Electrical Stimulation;Cryotherapy;Iontophoresis 41m/ml Dexamethasone;Moist Heat;Traction;Neuromuscular re-education;Therapeutic activities;Therapeutic exercise;Manual techniques;Patient/family education;Dry needling;Passive range of motion;Taping    PT Next Visit Plan FOTO. Update HEP PRN. Review cervical retraction, progress periscapular strengthening. manual therapy prn. Serratus punch, lower trap (Ys)    PT Home Exercise Plan [id number] - upper trap stretch, levator scap stretch, cervical retraction, scapular retraction, rows, shoulder EXT, corner stretch, cervical SNAG ROT, shoulder ER    Consulted and Agree with Plan of Care Patient           Patient will benefit from skilled therapeutic intervention in order to improve the following deficits and impairments:  Decreased activity tolerance,Decreased mobility,Decreased strength,Postural dysfunction,Improper body mechanics,Pain,Decreased endurance,Decreased range of motion,Increased muscle spasms,Impaired UE functional use  Visit Diagnosis: Cervicalgia  Abnormal posture  Muscle weakness (generalized)     Problem List Patient Active Problem List   Diagnosis Date  Noted  . Preventive measure 05/20/2020  . MGUS (monoclonal gammopathy of unknown significance) 12/25/2019  . Acquired hypothyroidism 12/25/2019  . Protein malnutrition ([place] 05/16/2019  . Constipation due to opioid therapy 05/09/2019  . Port-A-Cath in place 05/02/2019  . 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 Vitamin D deficiency 01/23/2017  . Rheumatoid arthritis, seronegative, multiple sites ([place] 01/19/2017  . High risk medication use 01/19/2017  . Primary osteoarthritis of both hands 01/19/2017  . Primary osteoarthritis of both knees 01/19/2017  . Fatigue 01/19/2017  . Thrombocytosis 01/19/2017  . Primary osteoarthritis of both feet 01/19/2017  . Macrocytic anemia 01/19/2017  . Screening-pulmonary TB 04/26/2016    KHaydee Monica PT, DPT 08/04/20 11:40 AM  [facility] [place] [address]Phone: [phone]  Fax:  [phone] Name: Jean [name]: [id number] of Birth: 905-04-1946  Care Home Management;Aquatic Therapy;Electrical Stimulation;Cryotherapy;Iontophoresis 41m/ml Dexamethasone;Moist Heat;Traction;Neuromuscular re-education;Therapeutic activities;Therapeutic exercise;Manual techniques;Patient/family education;Dry needling;Passive range of motion;Taping    PT Next Visit Plan FOTO. Update HEP PRN. Review cervical retraction, progress periscapular strengthening. manual therapy prn. Serratus punch, lower trap (Ys)    PT Home Exercise Plan [id number] - upper trap stretch, levator scap stretch, cervical retraction, scapular retraction, rows, shoulder EXT, corner stretch, cervical SNAG ROT, shoulder ER    Consulted and Agree with Plan of Care Patient           Patient will benefit from skilled therapeutic intervention in order to improve the following deficits and impairments:  Decreased activity tolerance,Decreased mobility,Decreased strength,Postural dysfunction,Improper body mechanics,Pain,Decreased endurance,Decreased range of motion,Increased muscle spasms,Impaired UE functional use  Visit Diagnosis: Cervicalgia  Abnormal posture  Muscle weakness (generalized)     Problem List Patient Active Problem List   Diagnosis Date  Noted  . Preventive measure 05/20/2020  . MGUS (monoclonal gammopathy of unknown significance) 12/25/2019  . Acquired hypothyroidism 12/25/2019  . Protein malnutrition ([place] 05/16/2019  . Constipation due to opioid therapy 05/09/2019  . Port-A-Cath in place 05/02/2019  . 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 Vitamin D deficiency 01/23/2017  . Rheumatoid arthritis, seronegative, multiple sites ([place] 01/19/2017  . High risk medication use 01/19/2017  . Primary osteoarthritis of both hands 01/19/2017  . Primary osteoarthritis of both knees 01/19/2017  . Fatigue 01/19/2017  . Thrombocytosis 01/19/2017  . Primary osteoarthritis of both feet 01/19/2017  . Macrocytic anemia 01/19/2017  . Screening-pulmonary TB 04/26/2016    KHaydee Monica PT, DPT 08/04/20 11:40 AM  [facility] [place] [address]Phone: [phone]  Fax:  [phone] Name: Jean [name]: [id number] of Birth: 905-04-1946  [place], [place], [address] Phone: [phone]   Fax:  [phone]  Physical Therapy Treatment/Re-evaluation  Patient Details  Name: Jean [name] MRN: [id number] Date of Birth: 24-Sep-1945 Referring Provider (PT): Ofilia Neas, PA-C   Encounter Date: 07/28/2020   PT End of Session - 07/28/20 1432    Visit Number 5    Number of Visits 8    Date for PT Re-Evaluation 08/23/20    Authorization Type UHC Medicare - [place] visit 6 and visit 10; PN visit 10; KX modifier visit 15    PT Start Time 1215    PT Stop Time 1300    PT Time Calculation (min) 45 min    Activity Tolerance Patient tolerated treatment well    Behavior During Therapy [facility] for tasks assessed/performed           Past Medical History:  Diagnosis Date  . Arthritis   . Cancer ([place])   . History of radiation therapy 05/03/19- 06/25/19   Larynx 35 fractions of 2 Gy each to total 70 Gy  . Hypertension   . Rheumatoid arthritis ([place])   . Vocal cord mass     Past Surgical History:  Procedure Laterality Date  . ECTOPIC PREGNANCY SURGERY    . EXTERNAL EAR SURGERY Right    removal of cyst or gland  . IR GASTROSTOMY TUBE MOD SED  04/26/2019  . IR GASTROSTOMY TUBE REMOVAL  11/05/2019  . IR IMAGING GUIDED PORT INSERTION  04/26/2019  . IR REMOVAL TUN ACCESS W/ PORT W/O FL MOD SED  11/05/2019  . MICROLARYNGOSCOPY Left 03/26/2019   Procedure: DIRECT MICROLARYNGOSCOPY WITH BIOPSY OF VOCAL CORD MASS;  Surgeon: Leta Baptist, MD;  Location: [facility];  Service: ENT;  Laterality: Left;    There were no vitals filed for this visit.   Subjective Assessment - 07/28/20 1217    Subjective "I have felt so much better since the last visit. I haven't had the pinching in my neck. I am still having trouble with my shoulders because of the RA."    Pertinent History RA, hx of cancer, HTN, arthritis    Limitations Sitting;House hold activities    How  long can you sit comfortably? "For a few minutes" - that's how bad it was before taking robaxin    How long can you stand comfortably? No issues    How long can you walk comfortably? No issues    Diagnostic tests 06/03/2020 cervical spine: Loss of cervical lordosis was noted.  C5-C6 narrowing with anterior spurring was noted. Facet joint arthropathy was noted. Impression: These findings are consistent with multilevel spondylosis with significant narrowing between C5-C6 and anterior osteophytes.    Patient Stated Goals "Be able to lift my left arm without discomfort"    Currently in Pain? Yes    Pain Score 2     Pain Location Neck    Pain Orientation Left    Pain Descriptors / Indicators Tightness   "just some stiffness"   Pain Onset More than a month ago              [facility] PT Assessment - 07/28/20 0001      Assessment   Medical Diagnosis Neck pain (M54.2)    Referring Provider (PT) Ofilia Neas, PA-C      AROM   Cervical Flexion 68    Cervical Extension 55    Cervical - Right Side Bend 40  Care Home Management;Aquatic Therapy;Electrical Stimulation;Cryotherapy;Iontophoresis 41m/ml Dexamethasone;Moist Heat;Traction;Neuromuscular re-education;Therapeutic activities;Therapeutic exercise;Manual techniques;Patient/family education;Dry needling;Passive range of motion;Taping    PT Next Visit Plan FOTO. Update HEP PRN. Review cervical retraction, progress periscapular strengthening. manual therapy prn. Serratus punch, lower trap (Ys)    PT Home Exercise Plan [id number] - upper trap stretch, levator scap stretch, cervical retraction, scapular retraction, rows, shoulder EXT, corner stretch, cervical SNAG ROT, shoulder ER    Consulted and Agree with Plan of Care Patient           Patient will benefit from skilled therapeutic intervention in order to improve the following deficits and impairments:  Decreased activity tolerance,Decreased mobility,Decreased strength,Postural dysfunction,Improper body mechanics,Pain,Decreased endurance,Decreased range of motion,Increased muscle spasms,Impaired UE functional use  Visit Diagnosis: Cervicalgia  Abnormal posture  Muscle weakness (generalized)     Problem List Patient Active Problem List   Diagnosis Date  Noted  . Preventive measure 05/20/2020  . MGUS (monoclonal gammopathy of unknown significance) 12/25/2019  . Acquired hypothyroidism 12/25/2019  . Protein malnutrition ([place] 05/16/2019  . Constipation due to opioid therapy 05/09/2019  . Port-A-Cath in place 05/02/2019  . 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 Vitamin D deficiency 01/23/2017  . Rheumatoid arthritis, seronegative, multiple sites ([place] 01/19/2017  . High risk medication use 01/19/2017  . Primary osteoarthritis of both hands 01/19/2017  . Primary osteoarthritis of both knees 01/19/2017  . Fatigue 01/19/2017  . Thrombocytosis 01/19/2017  . Primary osteoarthritis of both feet 01/19/2017  . Macrocytic anemia 01/19/2017  . Screening-pulmonary TB 04/26/2016    KHaydee Monica PT, DPT 08/04/20 11:40 AM  [facility] [place] [address]Phone: [phone]  Fax:  [phone] Name: Jean [name]: [id number] of Birth: 905-04-1946

## 2020-07-30 NOTE — Telephone Encounter (Signed)
Received a fax from  [place] regarding an approval for [facility] patient assistance from 07/29/20 to 06/20/21.   Called patient to notify and advised that I'll call her once Humira reaches the clinic. Patient verbalized understanding. Called [facility] and pharmacy has changed shipping address. Will f/u on Friday to check status of delivery.  Phone number: [phone]  Knox Saliva, PharmD, MPH Clinical Pharmacist (Rheumatology and Pulmonology)

## 2020-08-01 ENCOUNTER — Telehealth: Payer: Self-pay

## 2020-08-01 NOTE — Telephone Encounter (Signed)
[facility] [facility] left a voicemail stating the patient requested that we reach out to the office to schedule another shipment of her medication to your facility.  Please call back at [phone] option 1.  Monday - Friday 7:00 am to 7:00 pm CST

## 2020-08-01 NOTE — Telephone Encounter (Signed)
Shipment scheduled for office on 08/06/20

## 2020-08-01 NOTE — Telephone Encounter (Signed)
Spoke to [employer] [facility]- Humira shipment is scheduled to deliver to the office on 2.16.22

## 2020-08-04 ENCOUNTER — Ambulatory Visit: Payer: Medicare Other

## 2020-08-04 ENCOUNTER — Other Ambulatory Visit: Payer: Self-pay

## 2020-08-04 DIAGNOSIS — R293 Abnormal posture: Secondary | ICD-10-CM

## 2020-08-04 DIAGNOSIS — M6281 Muscle weakness (generalized): Secondary | ICD-10-CM

## 2020-08-04 DIAGNOSIS — M542 Cervicalgia: Secondary | ICD-10-CM | POA: Diagnosis not present

## 2020-08-04 NOTE — Therapy (Signed)
[facility] [address] [place], [place], [address] Phone: [phone]   Fax:  [phone]  Physical Therapy Treatment  Patient Details  Name: Jean [name] MRN: [id number] Date of Birth: 07/13/1945 Referring Provider (PT): Gearldine Bienenstock, PA-C   Encounter Date: 08/04/2020   PT End of Session - 08/04/20 1119    Visit Number 6    Number of Visits 8    Date for PT Re-Evaluation 08/23/20    Authorization Type [employer] Medicare - FOTO visit 6 and visit 10; PN visit 10; KX modifier visit 15    PT Start Time 1110   patient late   PT Stop Time 1150    PT Time Calculation (min) 40 min    Activity Tolerance Patient tolerated treatment well    Behavior During Therapy [facility] for tasks assessed/performed           Past Medical History:  Diagnosis Date  . Arthritis   . Cancer (HCC)   . History of radiation therapy 05/03/19- 06/25/19   Larynx 35 fractions of 2 Gy each to total 70 Gy  . Hypertension   . Rheumatoid arthritis (HCC)   . Vocal cord mass     Past Surgical History:  Procedure Laterality Date  . ECTOPIC PREGNANCY SURGERY    . EXTERNAL EAR SURGERY Right    removal of cyst or gland  . IR GASTROSTOMY TUBE MOD SED  04/26/2019  . IR GASTROSTOMY TUBE REMOVAL  11/05/2019  . IR IMAGING GUIDED PORT INSERTION  04/26/2019  . IR REMOVAL TUN ACCESS W/ PORT W/O FL MOD SED  11/05/2019  . MICROLARYNGOSCOPY Left 03/26/2019   Procedure: DIRECT MICROLARYNGOSCOPY WITH BIOPSY OF VOCAL CORD MASS;  Surgeon: Newman Pies, MD;  Location: [facility];  Service: ENT;  Laterality: Left;    There were no vitals filed for this visit.   Subjective Assessment - 08/04/20 1110    Subjective Patient states she is "on the struggle bus" this morning, but her neck is feeling "great." She reports compliance with HEP.    Diagnostic tests 06/03/2020 cervical spine: Loss of cervical lordosis was noted.  C5-C6 narrowing with anterior spurring was noted.  Facet joint arthropathy was noted. Impression: These findings are consistent with multilevel spondylosis with significant narrowing between C5-C6 and anterior osteophytes.    Currently in Pain? No/denies              [facility] PT Assessment - 08/04/20 0001      Observation/Other Assessments   Focus on Therapeutic Outcomes (FOTO)  68% function                         [facility] Adult PT Treatment/Exercise - 08/04/20 0001      Self-Care   Other Self-Care Comments  see patient education      Neck Exercises: Machines for Strengthening   UBE (Upper Arm Bike) Level 2.5; 4 min (2 min each fwd/bwd)      Neck Exercises: Theraband   Shoulder Extension Limitations 2 x 15 green band    Shoulder External Rotation Limitations 2 x 15 bilateral shoulder ER    Horizontal ADduction Limitations 2 x 10; red TB      Neck Exercises: Seated   Other Seated Exercise pulleys flexion 2 min      Neck Exercises: Supine   Neck Retraction Limitations 2 x 10    Other Supine Exercise serratus punch attempted  PT Education - 08/04/20 1135    Education Details FOTO score and anticipated progress    Person(s) Educated Patient    Methods Explanation    Comprehension Verbalized understanding            PT Short Term Goals - 08/04/20 1135      PT SHORT TERM GOAL #1   Title Patient will be independent with initial HEP.    Baseline Pt provided with initial HEP at evaluation 06/18/2020.    Time 3    Period Weeks    Status Achieved    Target Date 07/09/20      PT SHORT TERM GOAL #2   Title Patient will be able to don jacket and reach overhead behind back (L shoulder ER) with </= 2/10 pain.    Baseline Difficulty donning jacket some days when RA symptoms are aggravated but able to do so    Time 3    Period Weeks    Status Partially Met    Target Date 07/09/20      PT SHORT TERM GOAL #3   Title Patient will increase B shoulder MMT to at least 4/5 grossly.     Baseline See flowsheet    Time 3    Period Weeks    Status On-going    Target Date 07/09/20      PT SHORT TERM GOAL #4   Title Pt will improve cervical B SB to at least 40 degrees and B ROT to at least 70 degrees without significant pain.    Baseline Cervical SB: R 40 deg; L 34 deg. ROT: R 58 deg; L 58 deg    Time 3    Period Weeks    Status On-going    Target Date 07/09/20             PT Long Term Goals - 08/04/20 1152      PT LONG TERM GOAL #1   Title Patient will be independent with advanced HEP.    Baseline Pt provided with initial HEP at evaluation 06/18/2020.    Time 6    Period Weeks    Status On-going      PT LONG TERM GOAL #2   Title Patient will be able to don jacket and reach overhead behind back (L shoulder ER) with 0/10 pain.    Baseline Difficulty donning jacket some days when RA symptoms are aggravated but able to do so    Time 6    Period Weeks    Status On-going      PT LONG TERM GOAL #3   Title Patient will increase B shoulder MMT to 5/5 grossly.    Baseline See flowsheet    Time 6    Period Weeks    Status On-going      PT LONG TERM GOAL #4   Title Pt will be able to perform cervical AROM WFL in all planes without increase in pain.    Baseline See flowsheet    Time 6    Period Weeks    Status On-going      PT LONG TERM GOAL #5   Title Patient's FOTO score will improve from 53% function to 67% function to demonstrated increased perceived ability.    Baseline 68%    Time 6    Period Weeks    Status Achieved                 Plan - 08/04/20 1112    Clinical  Impression Statement Patient tolerated session well today without reports of neck pain, though reported fatigue in periscapular musculature at end of session. Patient was unable to complete serratus punch, though this is due to limited shoulder ROM secondary to RA. She required minimal postural cues with periscapular strengthening with ability to correct once cued. She is nearing D/C  pending independence and tolerance to advanced home program.    Personal Factors and Comorbidities Age;Comorbidity 3+    Comorbidities RA, hx of cancer, HTN, arthritis    Examination-Activity Limitations Reach Overhead;Lift;Carry;Caring for Others    Examination-Participation Restrictions Cleaning;Community Activity;Meal Prep;Shop    Stability/Clinical Decision Making Evolving/Moderate complexity    Rehab Potential Good    PT Frequency 1x / week    PT Duration 3 weeks    PT Treatment/Interventions ADLs/Self Care Home Management;Aquatic Therapy;Electrical Stimulation;Cryotherapy;Iontophoresis 4mg /ml Dexamethasone;Moist Heat;Traction;Neuromuscular re-education;Therapeutic activities;Therapeutic exercise;Manual techniques;Patient/family education;Dry needling;Passive range of motion;Taping    PT Next Visit Plan anticipate D/C. update HEP.    PT Home Exercise Plan [id number] - upper trap stretch, levator scap stretch, cervical retraction, scapular retraction, rows, shoulder EXT, corner stretch, cervical SNAG ROT, shoulder ER    Consulted and Agree with Plan of Care Patient           Patient will benefit from skilled therapeutic intervention in order to improve the following deficits and impairments:  Decreased activity tolerance,Decreased mobility,Decreased strength,Postural dysfunction,Improper body mechanics,Pain,Decreased endurance,Decreased range of motion,Increased muscle spasms,Impaired UE functional use  Visit Diagnosis: Cervicalgia  Abnormal posture  Muscle weakness (generalized)     Problem List Patient Active Problem List   Diagnosis Date Noted  . Preventive measure 05/20/2020  . MGUS (monoclonal gammopathy of unknown significance) 12/25/2019  . Acquired hypothyroidism 12/25/2019  . Protein malnutrition (HCC) 05/16/2019  . Constipation due to opioid therapy 05/09/2019  . Port-A-Cath in place 05/02/2019  . 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  Vitamin D deficiency 01/23/2017  . Rheumatoid arthritis, seronegative, multiple sites (HCC) 01/19/2017  . High risk medication use 01/19/2017  . Primary osteoarthritis of both hands 01/19/2017  . Primary osteoarthritis of both knees 01/19/2017  . Fatigue 01/19/2017  . Thrombocytosis 01/19/2017  . Primary osteoarthritis of both feet 01/19/2017  . Macrocytic anemia 01/19/2017  . Screening-pulmonary TB 04/26/2016   Letitia Libra, PT, DPT, ATC 08/04/20 12:00 PM  [facility] [address] [place], [place], [address] Phone: [phone]   Fax:  [phone]  Name: Jean [name] MRN: [id number] Date of Birth: [birth year]

## 2020-08-06 ENCOUNTER — Ambulatory Visit: Payer: Medicare Other | Admitting: Pharmacist

## 2020-08-06 ENCOUNTER — Other Ambulatory Visit: Payer: Self-pay

## 2020-08-06 VITALS — BP 123/72 | HR 105

## 2020-08-06 DIAGNOSIS — Z79899 Other long term (current) drug therapy: Secondary | ICD-10-CM

## 2020-08-06 DIAGNOSIS — M0609 Rheumatoid arthritis without rheumatoid factor, multiple sites: Secondary | ICD-10-CM

## 2020-08-06 NOTE — Telephone Encounter (Signed)
Patient's Humira shipment arrived to clinic. Patient scheduled with pharmacy clinic to re-start Humira tiday 08/06/20 @ 10:30a  Knox Saliva, PharmD, MPH Clinical Pharmacist (Rheumatology and Pulmonology)

## 2020-08-06 NOTE — Patient Instructions (Addendum)
Your Humira dose will be 40mg  (1 pen) every 2 weeks. Continue taking leflunomide (Arava) 20mg  once daily  Next dose is due 3/2/2, 09/03/20, 09/17/20, and every 2 weeks thereafter. Mark in your calendar!  Your Humira will come from [facility] for free. Their phone number is [phone]. Please refrigerate everytime once your receive the medication in your mail.  Standing Labs Please have your standing labs drawn in 1 month then very 3 months  If possible, please have your labs drawn 2 weeks prior to your appointment so that the provider can discuss your results at your appointment.  We have open lab daily Monday through Thursday from 1:30-4:30 PM and Friday from 1:30-4:00 PM at the office of Dr. Bo Merino, [facility].   Please be advised, all patients with office appointments requiring lab work will take precedents over walk-in lab work.  If possible, please come for your lab work on Monday and Friday afternoons, as you may experience shorter wait times. The office is located at [address] [place], [place], [place] [address] No appointment is necessary.   Labs are drawn by [facility]. Please bring your co-pay at the time of your lab draw.  You may receive a bill from [facility] for your lab work.  If you wish to have your labs drawn at another location, please call the office 24 hours in advance to send orders.  If you have any questions regarding directions or hours of operation,  please call [phone].   As a reminder, please drink plenty of water prior to coming for your lab work. Thanks!  Adalimumab Injection What is this medicine? ADALIMUMAB (ay da LIM yoo mab) is used to treat rheumatoid and psoriatic arthritis. It is also used to treat ankylosing spondylitis, Crohn's disease, ulcerative colitis, plaque psoriasis, hidradenitis suppurativa, and uveitis. This medicine may be used for other purposes; ask your health care provider or pharmacist if you have  questions. COMMON BRAND NAME(S): CYLTEZO, Humira What should I tell my health care provider before I take this medicine? They need to know if you have any of these conditions:  cancer  diabetes (high blood sugar)  having surgery  heart disease  hepatitis B  immune system problems  infections, such as tuberculosis (TB) or other bacterial, fungal, or viral infections  multiple sclerosis  recent or upcoming vaccine  an unusual reaction to adalimumab, mannitol, latex, rubber, other medicines, foods, dyes, or preservatives  pregnant or trying to get pregnant  breast-feeding How should I use this medicine? This medicine is for injection under the skin. You will be taught how to prepare and give it. Take it as directed on the prescription label. Keep taking it unless your health care provider tells you to stop. It is important that you put your used needles and syringes in a special sharps container. Do not put them in a trash can. If you do not have a sharps container, call your pharmacist or health care provider to get one. This medicine comes with INSTRUCTIONS FOR USE. Ask your pharmacist for directions on how to use this medicine. Read the information carefully. Talk to your pharmacist or health care provider if you have questions. A special MedGuide will be given to you by the pharmacist with each prescription and refill. Be sure to read this information carefully each time. Talk to your pediatrician regarding the use of this medicine in children. While this drug may be prescribed for children as young as 2 years for selected conditions,  precautions do apply. Overdosage: If you think you have taken too much of this medicine contact a poison control center or emergency room at once. NOTE: This medicine is only for you. Do not share this medicine with others. What if I miss a dose? If you miss a dose, take it as soon as you can. If it is almost time for your next dose, take only that  dose. Do not take double or extra doses. It is important not to miss any doses. Talk to your health care provider about what to do if you miss a dose. What may interact with this medicine? Do not take this medicine with any of the following medications:  abatacept  anakinra  biologic medicines such as certolizumab, etanercept, golimumab, infliximab  live virus vaccines This medicine may also interact with the following medications:  cyclosporine  theophylline  vaccines  warfarin This list may not describe all possible interactions. Give your health care provider a list of all the medicines, herbs, non-prescription drugs, or dietary supplements you use. Also tell them if you smoke, drink alcohol, or use illegal drugs. Some items may interact with your medicine. What should I watch for while using this medicine? Visit your health care provider for regular checks on your progress. Tell your health care provider if your symptoms do not start to get better or if they get worse. You will be tested for tuberculosis (TB) before you start this medicine. If your doctor prescribes any medicine for TB, you should start taking the TB medicine before starting this medicine. Make sure to finish the full course of TB medicine. This medicine may increase your risk of getting an infection. Call your health care provider for advice if you get a fever, chills, sore throat, or other symptoms of a cold or flu. Do not treat yourself. Try to avoid being around people who are sick. Talk to your health care provider about your risk of cancer. You may be more at risk for certain types of cancer if you take this medicine. What side effects may I notice from receiving this medicine? Side effects that you should report to your doctor or health care professional as soon as possible:  allergic reactions like skin rash, itching or hives, swelling of the face, lips, or tongue  changes in vision  chest  pain  dizziness  heart failure (trouble breathing; fast, irregular heartbeat; sudden weight gain; swelling of the ankles, feet, hands; unusually weak or tired)  infection (fever, chills, cough, sore throat, pain or trouble passing urine)  liver injury (dark yellow or brown urine; general ill feeling or flu-like symptoms; loss of appetite, right upper belly pain; unusually weak or tired, yellowing of the eyes or skin)  lump or swollen lymph nodes on the neck, groin, or underarm area  muscle weakness  pain, tingling, numbness in the hands or feet  red, scaly patches or raised bumps on the skin  trouble breathing  unusual bleeding or bruising  unusually weak or tired Side effects that usually do not require medical attention (report to your doctor or health care professional if they continue or are bothersome):  headache  nausea  pain, redness, or irritation at site where injected  stuffy or runny nose This list may not describe all possible side effects. Call your doctor for medical advice about side effects. You may report side effects to FDA at 1-800-FDA-1088. Where should I keep my medicine? Keep out of the reach of children and pets. Store  in the refrigerator between 2 and 8 degrees C (36 and 46 degrees F). Do not freeze. Keep this medicine in the original packaging until you are ready to take it. Protect from light. Get rid of any unused medicine after the expiration date. This medicine may be stored at room temperature for up to 14 days. Keep this medicine in the original packaging. Protect from light. If it is stored at room temperature, get rid of any unused medicine after 14 days or after it expires, whichever is first. To get rid of medicines that are no longer needed or have expired:  Take the medicine to a medicine take-back program. Check with your pharmacy or law enforcement to find a location.  If you cannot return the medicine, ask your pharmacist or health care  provider how to get rid of this medicine safely. NOTE: This sheet is a summary. It may not cover all possible information. If you have questions about this medicine, talk to your doctor, pharmacist, or health care provider.  2021 Elsevier/Gold Standard (2019-08-16 17:28:40)

## 2020-08-06 NOTE — Progress Notes (Signed)
Pharmacy Note  Subjective:   Jean [name] presents to pharmacy clinic today to receive first dose of Humira. Patient was previously established on methotrexate and Humira. However methotrexate was stopped with elevated creatinine. Humira was also stopped due to chemotherapy and radiation therapy plan for vocal cord cancer. Her oncologist, Dr. Alvy Bimler, has cleared her to restart Humira and other biologic therapies for her rheumatoid arthritis since she her cancer is non-immune related. However, due to increased risk of lymphoma with TNF-inhibitor use, she will still require yearly dermatology screenings  Patient running a fever or have signs/symptoms of infection? No  Patient currently on antibiotics for the treatment of infection? No  Patient have any upcoming invasive procedures/surgeries? No  Objective: CMP     Component Value Date/Time   NA 137 07/08/2020 1417   NA 141 03/05/2014 1050   K 4.2 07/08/2020 1417   K 3.9 03/05/2014 1050   CL 102 07/08/2020 1417   CO2 26 07/08/2020 1417   CO2 24 03/05/2014 1050   GLUCOSE 99 07/08/2020 1417   GLUCOSE 99 03/05/2014 1050   BUN 17 07/08/2020 1417   BUN 10.6 03/05/2014 1050   CREATININE 0.93 07/08/2020 1417   CREATININE 0.8 03/05/2014 1050   CALCIUM 9.2 07/08/2020 1417   CALCIUM 8.8 03/05/2014 1050   PROT 6.5 07/08/2020 1417   PROT 6.5 03/05/2014 1050   ALBUMIN 3.4 (L) 05/13/2020 0857   ALBUMIN 3.2 (L) 03/05/2014 1050   AST 14 07/08/2020 1417   AST 13 (L) 10/10/2019 1402   AST 12 03/05/2014 1050   ALT 7 07/08/2020 1417   ALT 10 10/10/2019 1402   ALT 28 03/05/2014 1050   ALKPHOS 79 05/13/2020 0857   ALKPHOS 85 03/05/2014 1050   BILITOT 0.3 07/08/2020 1417   BILITOT <0.2 (L) 10/10/2019 1402   BILITOT 0.20 03/05/2014 1050   GFRNONAA 61 07/08/2020 1417   GFRAA 71 07/08/2020 1417    CBC    Component Value Date/Time   WBC 6.1 07/08/2020 1417   RBC 3.38 (L) 07/08/2020 1417   HGB 10.3 (L) 07/08/2020 1417   HGB 10.6 (L)  10/10/2019 1402   HGB 11.5 (L) 07/31/2014 0923   HCT 31.9 (L) 07/08/2020 1417   HCT 36.6 07/31/2014 0923   PLT 461 (H) 07/08/2020 1417   PLT 268 10/10/2019 1402   PLT 478 (H) 07/31/2014 0923   MCV 94.4 07/08/2020 1417   MCV 96.4 07/31/2014 0923   MCH 30.5 07/08/2020 1417   MCHC 32.3 07/08/2020 1417   RDW 13.3 07/08/2020 1417   RDW 17.2 (H) 07/31/2014 0923   LYMPHSABS 628 (L) 07/08/2020 1417   LYMPHSABS 1.3 07/31/2014 0923   MONOABS 0.3 05/13/2020 0857   MONOABS 0.6 07/31/2014 0923   EOSABS 12 (L) 07/08/2020 1417   EOSABS 0.0 07/31/2014 0923   BASOSABS 31 07/08/2020 1417   BASOSABS 0.1 07/31/2014 0923    Baseline Immunosuppressant Therapy Labs TB GOLD Quantiferon TB Gold Latest Ref Rng & Units 01/15/2019  Quantiferon TB Gold Plus NEGATIVE NEGATIVE   Hepatitis Panel (11/30/2013) - Hepatitis B surface antigen - Negative - Hepatitis B Core antibody, IgM - Non-Reactive - Hepatitis C Antibody - Negative - Hepatitis A Antibody, IgM - Non-Reactive  HIV (11/30/2013) - HIV 1/2 Ag/Ab w/ Reflex - Non-reactive  Immunoglobulins Immunoglobulin Electrophoresis Latest Ref Rng & Units 05/13/2020  IgG 586 - 1,602 mg/dL 762  IgM 26 - 217 mg/dL 45   SPEP Serum Protein Electrophoresis Latest Ref Rng & Units 07/08/2020  Total Protein  6.1 - 8.1 g/dL 6.5  Albumin 3.8 - 4.8 g/dL -  Alpha-1 0.2 - 0.3 g/dL -  Alpha-2 0.5 - 0.9 g/dL -  Beta Globulin 0.4 - 0.6 g/dL -  Beta 2 0.2 - 0.5 g/dL -  Gamma Globulin 0.8 - 1.7 g/dL -   Chest x-ray: 11/2013 wnl  Assessment/Plan:   Patient's Humira dose will be 40mg  SQ every 14 days. She will continue leflunomide 20mg  once daily. Consent for Humira signed today again since she has been off of therapy.  Demonstrated proper injection technique with Humira demo device  Patient able to demonstrate proper injection technique using the teach back method.  Patient self injected in the right lower abdomen with:  Used medication that was delivered from [facility] (no sample used today).  Medication: Humira 40mg /0.22mL pen-injector NDC: [address]-82 Lot: [id number] Expiration: 12/2021  Patient tolerated well.  Observed for 30 mins in office for adverse reaction.   High-risk medication use:  Patient is overdue for TB gold - drawn today. Last hepatitis panel was 11/30/2013. We will update hepatitis panel again today. Standing orders already in place for CBC with diff/platelet and CMP with GFR.  Patient will require yearly dermatology screenings. Dermatology referral placed today.  Patient is to return in 1 month for labs and 6-8 weeks for follow-up appointment. Appt with Hazel Sams, PA-C r/s to 09/03/20.  Patient in agreement.  Humira approved through [place] patient assistance.  A 12-week supply of Humira was delivered to the clinic. We used one pen from this supply today and patient provided with remainder of supply in original cooler including receipt/documentation  All questions encouraged and answered.  Instructed patient to call with any further questions or concerns.  Knox Saliva, PharmD, MPH Clinical Pharmacist (Rheumatology and Pulmonology)  08/06/2020 10:00 AM

## 2020-08-06 NOTE — Progress Notes (Deleted)
Office Visit Note  Patient: Jean [name]             Date of Birth: [birth year]           MRN: [id number]             PCP: [name], MD Referring: [name], MD Visit Date: 08/20/2020 Occupation: @GUAROCC @  Subjective:  No chief complaint on file.   History of Present Illness: Jean [name] is a 75 y.o. female ***   Activities of Daily Living:  Patient reports morning stiffness for *** {minute/hour:19697}.   Patient {ACTIONS;DENIES/REPORTS:21021675::"Denies"} nocturnal pain.  Difficulty dressing/grooming: {ACTIONS;DENIES/REPORTS:21021675::"Denies"} Difficulty climbing stairs: {ACTIONS;DENIES/REPORTS:21021675::"Denies"} Difficulty getting out of chair: {ACTIONS;DENIES/REPORTS:21021675::"Denies"} Difficulty using hands for taps, buttons, cutlery, and/or writing: {ACTIONS;DENIES/REPORTS:21021675::"Denies"}  No Rheumatology ROS completed.   PMFS History:  Patient Active Problem List   Diagnosis Date Noted  . Preventive measure 05/20/2020  . MGUS (monoclonal gammopathy of unknown significance) 12/25/2019  . Acquired hypothyroidism 12/25/2019  . Protein malnutrition (HCC) 05/16/2019  . Constipation due to opioid therapy 05/09/2019  . Port-A-Cath in place 05/02/2019  . 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 Vitamin D deficiency 01/23/2017  . Rheumatoid arthritis, seronegative, multiple sites (HCC) 01/19/2017  . High risk medication use 01/19/2017  . Primary osteoarthritis of both hands 01/19/2017  . Primary osteoarthritis of both knees 01/19/2017  . Fatigue 01/19/2017  . Thrombocytosis 01/19/2017  . Primary osteoarthritis of both feet 01/19/2017  . Macrocytic anemia 01/19/2017  . Screening-pulmonary TB 04/26/2016    Past Medical History:  Diagnosis Date  . Arthritis   . Cancer (HCC)   . History of radiation therapy 05/03/19- 06/25/19   Larynx 35 fractions of 2 Gy each to total 70 Gy  . Hypertension   . Rheumatoid arthritis  (HCC)   . Vocal cord mass     Family History  Problem Relation Age of Onset  . Stroke Mother   . Hypertension Daughter    Past Surgical History:  Procedure Laterality Date  . ECTOPIC PREGNANCY SURGERY    . EXTERNAL EAR SURGERY Right    removal of cyst or gland  . IR GASTROSTOMY TUBE MOD SED  04/26/2019  . IR GASTROSTOMY TUBE REMOVAL  11/05/2019  . IR IMAGING GUIDED PORT INSERTION  04/26/2019  . IR REMOVAL TUN ACCESS W/ PORT W/O FL MOD SED  11/05/2019  . MICROLARYNGOSCOPY Left 03/26/2019   Procedure: DIRECT MICROLARYNGOSCOPY WITH BIOPSY OF VOCAL CORD MASS;  Surgeon: Newman Pies, MD;  Location: [facility];  Service: ENT;  Laterality: Left;   Social History   Social History Narrative   Patient was recently widowed in March 2020.   Patient has 1 daughter who lives in [place]   Patient moved to [place] from [place] in January 2009.   Patient is a retired [employer].   Immunization History  Administered Date(s) Administered  . Fluad Quad(high Dose 65+) 05/20/2020  . Influenza, High Dose Seasonal PF 06/09/2017  . PFIZER(Purple Top)SARS-COV-2 Vaccination 08/29/2019, 09/19/2019     Objective: Vital Signs: There were no vitals taken for this visit.   Physical Exam   Musculoskeletal Exam: ***  CDAI Exam: CDAI Score: - Patient Global: -; Provider Global: - Swollen: -; Tender: - Joint Exam 08/20/2020   No joint exam has been documented for this visit   There is currently no information documented on the homunculus. Go to the Rheumatology activity and complete the homunculus joint exam.  Investigation: No  additional findings.  Imaging: No results found.  Recent Labs: Lab Results  Component Value Date   WBC 6.1 07/08/2020   HGB 10.3 (L) 07/08/2020   PLT 461 (H) 07/08/2020   NA 137 07/08/2020   K 4.2 07/08/2020   CL 102 07/08/2020   CO2 26 07/08/2020   GLUCOSE 99 07/08/2020   BUN 17 07/08/2020   CREATININE 0.93 07/08/2020   BILITOT 0.3  07/08/2020   ALKPHOS 79 05/13/2020   AST 14 07/08/2020   ALT 7 07/08/2020   PROT 6.5 07/08/2020   ALBUMIN 3.4 (L) 05/13/2020   CALCIUM 9.2 07/08/2020   GFRAA 71 07/08/2020   QFTBGOLDPLUS NEGATIVE 01/15/2019    Speciality Comments: PPD- 07/08/17 Negative  Procedures:  No procedures performed Allergies: Sulfa antibiotics   Assessment / Plan:     Visit Diagnoses: Rheumatoid arthritis, seronegative, multiple sites (HCC)  High risk medication use  Primary osteoarthritis of both hands  Primary osteoarthritis of both knees  Primary osteoarthritis of both feet  Age-related osteoporosis without current pathological fracture  History of vitamin D deficiency  Post-menopausal  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  No orders of the defined types were placed in this encounter.   Face-to-face time spent with patient was *** minutes. Greater than 50% of time was spent in counseling and coordination of care.  Follow-Up Instructions: No follow-ups on file.   Gearldine Bienenstock, PA-C  Note - This record has been created using Dragon software.  Chart creation errors have been sought, but may not always  have been located. Such creation errors do not reflect on  the standard of medical care.

## 2020-08-07 NOTE — Progress Notes (Signed)
Hepatitis B and C negative.

## 2020-08-08 LAB — HEPATITIS B CORE ANTIBODY, IGM: Hep B C IgM: NONREACTIVE

## 2020-08-08 LAB — QUANTIFERON-TB GOLD PLUS
Mitogen-NIL: 8.91 IU/mL
NIL: 0.04 IU/mL
QuantiFERON-TB Gold Plus: NEGATIVE
TB1-NIL: <0 IU/mL
TB2-NIL: <0 IU/mL

## 2020-08-08 LAB — HEPATITIS B SURFACE ANTIGEN: Hepatitis B Surface Ag: NONREACTIVE

## 2020-08-08 LAB — HEPATITIS C ANTIBODY
Hepatitis C Ab: NONREACTIVE
SIGNAL TO CUT-OFF: 0.02 (ref <1.00)

## 2020-08-08 NOTE — Progress Notes (Signed)
TB gold negative

## 2020-08-11 ENCOUNTER — Other Ambulatory Visit: Payer: Self-pay

## 2020-08-11 ENCOUNTER — Ambulatory Visit: Payer: Medicare Other

## 2020-08-11 DIAGNOSIS — R293 Abnormal posture: Secondary | ICD-10-CM

## 2020-08-11 DIAGNOSIS — M542 Cervicalgia: Secondary | ICD-10-CM | POA: Diagnosis not present

## 2020-08-11 DIAGNOSIS — M6281 Muscle weakness (generalized): Secondary | ICD-10-CM

## 2020-08-11 NOTE — Therapy (Signed)
[place], [place], [address] Phone: [phone]   Fax:  [phone]  Physical Therapy Treatment  Patient Details  Name: Jean [name] MRN: [id number] Date of Birth: [birth year] Referring Provider (PT): Ofilia Neas, PA-C   Encounter Date: 08/11/2020   PT End of Session - 08/11/20 1106    Visit Number 7    Number of Visits 8    Date for PT Re-Evaluation 08/23/20    Authorization Type [employer] Medicare - [place] visit 6 and visit 10; PN visit 10; KX modifier visit 15    PT Start Time 1106   patient late   PT Stop Time 1145    PT Time Calculation (min) 39 min    Activity Tolerance Patient tolerated treatment well    Behavior During Therapy [facility] for tasks assessed/performed           Past Medical History:  Diagnosis Date  . Arthritis   . Cancer ([place])   . History of radiation therapy 05/03/19- 06/25/19   Larynx 35 fractions of 2 Gy each to total 70 Gy  . Hypertension   . Rheumatoid arthritis ([place])   . Vocal cord mass     Past Surgical History:  Procedure Laterality Date  . ECTOPIC PREGNANCY SURGERY    . EXTERNAL EAR SURGERY Right    removal of cyst or gland  . IR GASTROSTOMY TUBE MOD SED  04/26/2019  . IR GASTROSTOMY TUBE REMOVAL  11/05/2019  . IR IMAGING GUIDED PORT INSERTION  04/26/2019  . IR REMOVAL TUN ACCESS W/ PORT W/O FL MOD SED  11/05/2019  . MICROLARYNGOSCOPY Left 03/26/2019   Procedure: DIRECT MICROLARYNGOSCOPY WITH BIOPSY OF VOCAL CORD MASS;  Surgeon: Leta Baptist, MD;  Location: [facility];  Service: ENT;  Laterality: Left;    There were no vitals filed for this visit.   Subjective Assessment - 08/11/20 1109    Subjective Patient reports the neck is feeling very good. She reports occasional twinges, but is being compliant with HEP. She began taking Humira last week (one dose every 2 weeks) and reports every day has been better and better since starting this on Wednesday. She has  plans to discuss PT for her shoulders when she has next appointment with physician regarding her RA.    Diagnostic tests 06/03/2020 cervical spine: Loss of cervical lordosis was noted.  C5-C6 narrowing with anterior spurring was noted. Facet joint arthropathy was noted. Impression: These findings are consistent with multilevel spondylosis with significant narrowing between C5-C6 and anterior osteophytes.    Currently in Pain? Yes    Pain Score 5     Pain Location Shoulder    Pain Orientation Left;Right    Pain Descriptors / Indicators Sore    Pain Type Chronic pain    Pain Onset More than a month ago              [facility] PT Assessment - 08/11/20 0001      Strength   Right Shoulder Flexion 4-/5    Right Shoulder ABduction 4-/5    Right Shoulder Internal Rotation 5/5    Right Shoulder External Rotation 4/5    Left Shoulder Flexion 4-/5    Left Shoulder ABduction 4-/5    Left Shoulder Internal Rotation 5/5    Left Shoulder External Rotation 4/5                         [facility]  [place], [place], [address] Phone: [phone]   Fax:  [phone]  Physical Therapy Treatment  Patient Details  Name: Jean [name] MRN: [id number] Date of Birth: [birth year] Referring Provider (PT): Ofilia Neas, PA-C   Encounter Date: 08/11/2020   PT End of Session - 08/11/20 1106    Visit Number 7    Number of Visits 8    Date for PT Re-Evaluation 08/23/20    Authorization Type [employer] Medicare - [place] visit 6 and visit 10; PN visit 10; KX modifier visit 15    PT Start Time 1106   patient late   PT Stop Time 1145    PT Time Calculation (min) 39 min    Activity Tolerance Patient tolerated treatment well    Behavior During Therapy [facility] for tasks assessed/performed           Past Medical History:  Diagnosis Date  . Arthritis   . Cancer ([place])   . History of radiation therapy 05/03/19- 06/25/19   Larynx 35 fractions of 2 Gy each to total 70 Gy  . Hypertension   . Rheumatoid arthritis ([place])   . Vocal cord mass     Past Surgical History:  Procedure Laterality Date  . ECTOPIC PREGNANCY SURGERY    . EXTERNAL EAR SURGERY Right    removal of cyst or gland  . IR GASTROSTOMY TUBE MOD SED  04/26/2019  . IR GASTROSTOMY TUBE REMOVAL  11/05/2019  . IR IMAGING GUIDED PORT INSERTION  04/26/2019  . IR REMOVAL TUN ACCESS W/ PORT W/O FL MOD SED  11/05/2019  . MICROLARYNGOSCOPY Left 03/26/2019   Procedure: DIRECT MICROLARYNGOSCOPY WITH BIOPSY OF VOCAL CORD MASS;  Surgeon: Leta Baptist, MD;  Location: [facility];  Service: ENT;  Laterality: Left;    There were no vitals filed for this visit.   Subjective Assessment - 08/11/20 1109    Subjective Patient reports the neck is feeling very good. She reports occasional twinges, but is being compliant with HEP. She began taking Humira last week (one dose every 2 weeks) and reports every day has been better and better since starting this on Wednesday. She has  plans to discuss PT for her shoulders when she has next appointment with physician regarding her RA.    Diagnostic tests 06/03/2020 cervical spine: Loss of cervical lordosis was noted.  C5-C6 narrowing with anterior spurring was noted. Facet joint arthropathy was noted. Impression: These findings are consistent with multilevel spondylosis with significant narrowing between C5-C6 and anterior osteophytes.    Currently in Pain? Yes    Pain Score 5     Pain Location Shoulder    Pain Orientation Left;Right    Pain Descriptors / Indicators Sore    Pain Type Chronic pain    Pain Onset More than a month ago              [facility] PT Assessment - 08/11/20 0001      Strength   Right Shoulder Flexion 4-/5    Right Shoulder ABduction 4-/5    Right Shoulder Internal Rotation 5/5    Right Shoulder External Rotation 4/5    Left Shoulder Flexion 4-/5    Left Shoulder ABduction 4-/5    Left Shoulder Internal Rotation 5/5    Left Shoulder External Rotation 4/5                         [facility]  [place], [place], [address] Phone: [phone]   Fax:  [phone]  Name: Jean [name] MRN: [id number] Date of Birth: 01/01/1946  [place], [place], [address] Phone: [phone]   Fax:  [phone]  Physical Therapy Treatment  Patient Details  Name: Jean [name] MRN: [id number] Date of Birth: [birth year] Referring Provider (PT): Ofilia Neas, PA-C   Encounter Date: 08/11/2020   PT End of Session - 08/11/20 1106    Visit Number 7    Number of Visits 8    Date for PT Re-Evaluation 08/23/20    Authorization Type [employer] Medicare - [place] visit 6 and visit 10; PN visit 10; KX modifier visit 15    PT Start Time 1106   patient late   PT Stop Time 1145    PT Time Calculation (min) 39 min    Activity Tolerance Patient tolerated treatment well    Behavior During Therapy [facility] for tasks assessed/performed           Past Medical History:  Diagnosis Date  . Arthritis   . Cancer ([place])   . History of radiation therapy 05/03/19- 06/25/19   Larynx 35 fractions of 2 Gy each to total 70 Gy  . Hypertension   . Rheumatoid arthritis ([place])   . Vocal cord mass     Past Surgical History:  Procedure Laterality Date  . ECTOPIC PREGNANCY SURGERY    . EXTERNAL EAR SURGERY Right    removal of cyst or gland  . IR GASTROSTOMY TUBE MOD SED  04/26/2019  . IR GASTROSTOMY TUBE REMOVAL  11/05/2019  . IR IMAGING GUIDED PORT INSERTION  04/26/2019  . IR REMOVAL TUN ACCESS W/ PORT W/O FL MOD SED  11/05/2019  . MICROLARYNGOSCOPY Left 03/26/2019   Procedure: DIRECT MICROLARYNGOSCOPY WITH BIOPSY OF VOCAL CORD MASS;  Surgeon: Leta Baptist, MD;  Location: [facility];  Service: ENT;  Laterality: Left;    There were no vitals filed for this visit.   Subjective Assessment - 08/11/20 1109    Subjective Patient reports the neck is feeling very good. She reports occasional twinges, but is being compliant with HEP. She began taking Humira last week (one dose every 2 weeks) and reports every day has been better and better since starting this on Wednesday. She has  plans to discuss PT for her shoulders when she has next appointment with physician regarding her RA.    Diagnostic tests 06/03/2020 cervical spine: Loss of cervical lordosis was noted.  C5-C6 narrowing with anterior spurring was noted. Facet joint arthropathy was noted. Impression: These findings are consistent with multilevel spondylosis with significant narrowing between C5-C6 and anterior osteophytes.    Currently in Pain? Yes    Pain Score 5     Pain Location Shoulder    Pain Orientation Left;Right    Pain Descriptors / Indicators Sore    Pain Type Chronic pain    Pain Onset More than a month ago              [facility] PT Assessment - 08/11/20 0001      Strength   Right Shoulder Flexion 4-/5    Right Shoulder ABduction 4-/5    Right Shoulder Internal Rotation 5/5    Right Shoulder External Rotation 4/5    Left Shoulder Flexion 4-/5    Left Shoulder ABduction 4-/5    Left Shoulder Internal Rotation 5/5    Left Shoulder External Rotation 4/5                         [facility]

## 2020-08-18 ENCOUNTER — Ambulatory Visit: Payer: Medicare Other

## 2020-08-18 ENCOUNTER — Other Ambulatory Visit: Payer: Self-pay

## 2020-08-18 DIAGNOSIS — R293 Abnormal posture: Secondary | ICD-10-CM

## 2020-08-18 DIAGNOSIS — M6281 Muscle weakness (generalized): Secondary | ICD-10-CM

## 2020-08-18 DIAGNOSIS — M542 Cervicalgia: Secondary | ICD-10-CM | POA: Diagnosis not present

## 2020-08-18 NOTE — Therapy (Signed)
[facility] [place], [place], [address] Phone: [phone]   Fax:  [phone]  Physical Therapy Treatment/Discharge Summary  Patient Details  Name: Jean [name] MRN: [id number] Date of Birth: 08-24-1945 Referring Provider (PT): Ofilia Neas, PA-C   Encounter Date: 08/18/2020   PT End of Session - 08/18/20 1221    Visit Number 8    Number of Visits 8    Date for PT Re-Evaluation 08/23/20    Authorization Type [employer] Medicare - [place] visit 6 and visit 10; PN visit 10; KX modifier visit 15    PT Start Time 1222   pt arrived late   PT Stop Time 1300    PT Time Calculation (min) 38 min    Activity Tolerance Patient tolerated treatment well    Behavior During Therapy [facility] for tasks assessed/performed           Past Medical History:  Diagnosis Date  . Arthritis   . Cancer ([place])   . History of radiation therapy 05/03/19- 06/25/19   Larynx 35 fractions of 2 Gy each to total 70 Gy  . Hypertension   . Rheumatoid arthritis ([place])   . Vocal cord mass     Past Surgical History:  Procedure Laterality Date  . ECTOPIC PREGNANCY SURGERY    . EXTERNAL EAR SURGERY Right    removal of cyst or gland  . IR GASTROSTOMY TUBE MOD SED  04/26/2019  . IR GASTROSTOMY TUBE REMOVAL  11/05/2019  . IR IMAGING GUIDED PORT INSERTION  04/26/2019  . IR REMOVAL TUN ACCESS W/ PORT W/O FL MOD SED  11/05/2019  . MICROLARYNGOSCOPY Left 03/26/2019   Procedure: DIRECT MICROLARYNGOSCOPY WITH BIOPSY OF VOCAL CORD MASS;  Surgeon: Leta Baptist, MD;  Location: [facility];  Service: ENT;  Laterality: Left;    There were no vitals filed for this visit.   Subjective Assessment - 08/18/20 1221    Subjective Pt reports her neck continues to feel good. She explains that the majority of her UE discomfort is from her shoulders. She has an appointment mid March during which she plans to discuss potential consult for PT to address her shoulder pain  secondary to RA.    Pertinent History RA, hx of cancer, HTN, arthritis    Limitations Sitting;House hold activities    How long can you sit comfortably? "For a few minutes" - that's how bad it was before taking robaxin    How long can you stand comfortably? No issues    How long can you walk comfortably? No issues    Diagnostic tests 06/03/2020 cervical spine: Loss of cervical lordosis was noted.  C5-C6 narrowing with anterior spurring was noted. Facet joint arthropathy was noted. Impression: These findings are consistent with multilevel spondylosis with significant narrowing between C5-C6 and anterior osteophytes.    Patient Stated Goals "Be able to lift my left arm without discomfort"    Currently in Pain? Yes    Pain Score 5     Pain Location Shoulder    Pain Orientation Right;Left    Pain Descriptors / Indicators Sore    Pain Onset More than a month ago              [facility] PT Assessment - 08/18/20 0001      Assessment   Medical Diagnosis Neck pain (M54.2)    Referring Provider (PT) Ofilia Neas, PA-C      Observation/Other Assessments   Focus on Therapeutic  [facility] [place], [place], [address] Phone: [phone]   Fax:  [phone]  Physical Therapy Treatment/Discharge Summary  Patient Details  Name: Jean [name] MRN: [id number] Date of Birth: 08-24-1945 Referring Provider (PT): Ofilia Neas, PA-C   Encounter Date: 08/18/2020   PT End of Session - 08/18/20 1221    Visit Number 8    Number of Visits 8    Date for PT Re-Evaluation 08/23/20    Authorization Type [employer] Medicare - [place] visit 6 and visit 10; PN visit 10; KX modifier visit 15    PT Start Time 1222   pt arrived late   PT Stop Time 1300    PT Time Calculation (min) 38 min    Activity Tolerance Patient tolerated treatment well    Behavior During Therapy [facility] for tasks assessed/performed           Past Medical History:  Diagnosis Date  . Arthritis   . Cancer ([place])   . History of radiation therapy 05/03/19- 06/25/19   Larynx 35 fractions of 2 Gy each to total 70 Gy  . Hypertension   . Rheumatoid arthritis ([place])   . Vocal cord mass     Past Surgical History:  Procedure Laterality Date  . ECTOPIC PREGNANCY SURGERY    . EXTERNAL EAR SURGERY Right    removal of cyst or gland  . IR GASTROSTOMY TUBE MOD SED  04/26/2019  . IR GASTROSTOMY TUBE REMOVAL  11/05/2019  . IR IMAGING GUIDED PORT INSERTION  04/26/2019  . IR REMOVAL TUN ACCESS W/ PORT W/O FL MOD SED  11/05/2019  . MICROLARYNGOSCOPY Left 03/26/2019   Procedure: DIRECT MICROLARYNGOSCOPY WITH BIOPSY OF VOCAL CORD MASS;  Surgeon: Leta Baptist, MD;  Location: [facility];  Service: ENT;  Laterality: Left;    There were no vitals filed for this visit.   Subjective Assessment - 08/18/20 1221    Subjective Pt reports her neck continues to feel good. She explains that the majority of her UE discomfort is from her shoulders. She has an appointment mid March during which she plans to discuss potential consult for PT to address her shoulder pain  secondary to RA.    Pertinent History RA, hx of cancer, HTN, arthritis    Limitations Sitting;House hold activities    How long can you sit comfortably? "For a few minutes" - that's how bad it was before taking robaxin    How long can you stand comfortably? No issues    How long can you walk comfortably? No issues    Diagnostic tests 06/03/2020 cervical spine: Loss of cervical lordosis was noted.  C5-C6 narrowing with anterior spurring was noted. Facet joint arthropathy was noted. Impression: These findings are consistent with multilevel spondylosis with significant narrowing between C5-C6 and anterior osteophytes.    Patient Stated Goals "Be able to lift my left arm without discomfort"    Currently in Pain? Yes    Pain Score 5     Pain Location Shoulder    Pain Orientation Right;Left    Pain Descriptors / Indicators Sore    Pain Onset More than a month ago              [facility] PT Assessment - 08/18/20 0001      Assessment   Medical Diagnosis Neck pain (M54.2)    Referring Provider (PT) Ofilia Neas, PA-C      Observation/Other Assessments   [facility] on Therapeutic  [facility] [place], [place], [address] Phone: [phone]   Fax:  [phone]  Physical Therapy Treatment/Discharge Summary  Patient Details  Name: Jean [name] MRN: [id number] Date of Birth: 08-24-1945 Referring Provider (PT): Ofilia Neas, PA-C   Encounter Date: 08/18/2020   PT End of Session - 08/18/20 1221    Visit Number 8    Number of Visits 8    Date for PT Re-Evaluation 08/23/20    Authorization Type [employer] Medicare - [place] visit 6 and visit 10; PN visit 10; KX modifier visit 15    PT Start Time 1222   pt arrived late   PT Stop Time 1300    PT Time Calculation (min) 38 min    Activity Tolerance Patient tolerated treatment well    Behavior During Therapy [facility] for tasks assessed/performed           Past Medical History:  Diagnosis Date  . Arthritis   . Cancer ([place])   . History of radiation therapy 05/03/19- 06/25/19   Larynx 35 fractions of 2 Gy each to total 70 Gy  . Hypertension   . Rheumatoid arthritis ([place])   . Vocal cord mass     Past Surgical History:  Procedure Laterality Date  . ECTOPIC PREGNANCY SURGERY    . EXTERNAL EAR SURGERY Right    removal of cyst or gland  . IR GASTROSTOMY TUBE MOD SED  04/26/2019  . IR GASTROSTOMY TUBE REMOVAL  11/05/2019  . IR IMAGING GUIDED PORT INSERTION  04/26/2019  . IR REMOVAL TUN ACCESS W/ PORT W/O FL MOD SED  11/05/2019  . MICROLARYNGOSCOPY Left 03/26/2019   Procedure: DIRECT MICROLARYNGOSCOPY WITH BIOPSY OF VOCAL CORD MASS;  Surgeon: Leta Baptist, MD;  Location: [facility];  Service: ENT;  Laterality: Left;    There were no vitals filed for this visit.   Subjective Assessment - 08/18/20 1221    Subjective Pt reports her neck continues to feel good. She explains that the majority of her UE discomfort is from her shoulders. She has an appointment mid March during which she plans to discuss potential consult for PT to address her shoulder pain  secondary to RA.    Pertinent History RA, hx of cancer, HTN, arthritis    Limitations Sitting;House hold activities    How long can you sit comfortably? "For a few minutes" - that's how bad it was before taking robaxin    How long can you stand comfortably? No issues    How long can you walk comfortably? No issues    Diagnostic tests 06/03/2020 cervical spine: Loss of cervical lordosis was noted.  C5-C6 narrowing with anterior spurring was noted. Facet joint arthropathy was noted. Impression: These findings are consistent with multilevel spondylosis with significant narrowing between C5-C6 and anterior osteophytes.    Patient Stated Goals "Be able to lift my left arm without discomfort"    Currently in Pain? Yes    Pain Score 5     Pain Location Shoulder    Pain Orientation Right;Left    Pain Descriptors / Indicators Sore    Pain Onset More than a month ago              [facility] PT Assessment - 08/18/20 0001      Assessment   Medical Diagnosis Neck pain (M54.2)    Referring Provider (PT) Ofilia Neas, PA-C      Observation/Other Assessments   Focus on Therapeutic  Frequency 1x / week    PT Duration 3 weeks    PT Treatment/Interventions ADLs/Self Care Home Management;Aquatic Therapy;Electrical Stimulation;Cryotherapy;Iontophoresis 27m/ml Dexamethasone;Moist Heat;Traction;Neuromuscular re-education;Therapeutic activities;Therapeutic exercise;Manual techniques;Patient/family education;Dry needling;Passive range of motion;Taping    PT Next Visit Plan D/C    PT Home Exercise Plan [id number] - upper trap stretch, levator scap stretch, cervical retraction, scapular retraction, rows, shoulder EXT, corner stretch, cervical SNAG ROT, shoulder ER    Consulted and Agree with Plan of Care Patient           Patient will benefit from skilled therapeutic intervention in order to improve the following deficits and impairments:  Decreased activity tolerance,Decreased mobility,Decreased strength,Postural dysfunction,Improper body mechanics,Pain,Decreased endurance,Decreased range of motion,Increased muscle spasms,Impaired UE functional use  Visit  Diagnosis: Cervicalgia  Abnormal posture  Muscle weakness (generalized)     Problem List Patient Active Problem List   Diagnosis Date Noted  . Preventive measure 05/20/2020  . MGUS (monoclonal gammopathy of unknown significance) 12/25/2019  . Acquired hypothyroidism 12/25/2019  . Protein malnutrition ([place] 05/16/2019  . Constipation due to opioid therapy 05/09/2019  . Port-A-Cath in place 05/02/2019  . 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 Vitamin D deficiency 01/23/2017  . Rheumatoid arthritis, seronegative, multiple sites ([place] 01/19/2017  . High risk medication use 01/19/2017  . Primary osteoarthritis of both hands 01/19/2017  . Primary osteoarthritis of both knees 01/19/2017  . Fatigue 01/19/2017  . Thrombocytosis 01/19/2017  . Primary osteoarthritis of both feet 01/19/2017  . Macrocytic anemia 01/19/2017  . Screening-pulmonary TB 04/26/2016    PHYSICAL THERAPY DISCHARGE SUMMARY  Visits from Start of Care: 8  Current functional level related to goals / functional outcomes: See above   Remaining deficits: See above   Education / Equipment: See above  Plan: Patient agrees to discharge.  Patient goals were partially met. Patient is being discharged due to being pleased with the current functional level.  ?????     Patient has met majority of stated goals with limitations primarily due to pain from RA. She is pleased with her current functional level regarding neck pain and motion.   KHaydee Monica PT, DPT 08/18/20 1:23 PM  [facility]St[facility] [address]Phone: [phone]  Fax:  [phone] Name: PJACLIN FINKSMRN: [id number] of Birth: [birth year]

## 2020-08-20 ENCOUNTER — Ambulatory Visit: Payer: Medicare Other | Admitting: Physician Assistant

## 2020-08-20 NOTE — Progress Notes (Signed)
Office Visit Note  Patient: Jean [name]             Date of Birth: 08-Aug-1945           MRN: [id number]             PCP: [name], MD Referring: [name], MD Visit Date: 09/03/2020 Occupation: @GUAROCC @  Subjective:  Left shoulder joint pain   History of Present Illness: Jean [name] is a 75 y.o. female with history of seronegative rheumatoid arthritis, osteoarthritis, and osteoporosis.  She is taking arava 20 mg 1 tablet by mouth daily and Humira 40 mg sq injections once every 14 days.  She restarted on Humira on 08/06/20.  She is due for her third humira injection today.  She continues to have pain, stiffness, and swelling in multiple joints.  She has been experiencing severe pain in both shoulders especially her left shoulder joint.  She has ongoing pain and swelling in both hands and both wrist joints.  She states that her morning stiffness has been lasting till about 1 PM on a daily basis.  She continues to have swelling in both knee joints and intermittent pain in both ankle joints.  She has not been taking any over-the-counter products for pain relief and has not taken prednisone recently.  She states that her neck pain has improved since going to physical therapy and she has continued home exercises.  She states that overall she has noticed about a 50% improvement since restarting on Humira.  She denies any recent infections.    Activities of Daily Living:  Patient reports morning stiffness for several hours.   Patient Denies nocturnal pain.  Difficulty dressing/grooming: Reports Difficulty climbing stairs: Reports Difficulty getting out of chair: Reports Difficulty using hands for taps, buttons, cutlery, and/or writing: Reports  Review of Systems  Constitutional: Positive for fatigue.  HENT: Negative for mouth sores, mouth dryness and nose dryness.   Eyes: Negative for pain, itching, visual disturbance and dryness.  Respiratory: Negative for cough,  hemoptysis, shortness of breath and difficulty breathing.   Cardiovascular: Positive for swelling in legs/feet. Negative for chest pain, palpitations and hypertension.  Gastrointestinal: Negative for abdominal pain, blood in stool, constipation and diarrhea.  Endocrine: Negative for increased urination.  Genitourinary: Negative for painful urination.  Musculoskeletal: Positive for arthralgias, joint pain, joint swelling, myalgias, muscle weakness, morning stiffness, muscle tenderness and myalgias.  Skin: Negative for color change, pallor, rash, hair loss, nodules/bumps, redness, skin tightness, ulcers and sensitivity to sunlight.  Allergic/Immunologic: Negative for susceptible to infections.  Neurological: Negative for dizziness, numbness, headaches and memory loss.  Hematological: Negative for swollen glands.  Psychiatric/Behavioral: Positive for sleep disturbance. Negative for depressed mood and confusion. The patient is not nervous/anxious.     PMFS History:  Patient Active Problem List   Diagnosis Date Noted  . Preventive measure 05/20/2020  . MGUS (monoclonal gammopathy of unknown significance) 12/25/2019  . Acquired hypothyroidism 12/25/2019  . Protein malnutrition (HCC) 05/16/2019  . Constipation due to opioid therapy 05/09/2019  . Port-A-Cath in place 05/02/2019  . 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 Vitamin D deficiency 01/23/2017  . Rheumatoid arthritis, seronegative, multiple sites (HCC) 01/19/2017  . High risk medication use 01/19/2017  . Primary osteoarthritis of both hands 01/19/2017  . Primary osteoarthritis of both knees 01/19/2017  . Fatigue 01/19/2017  . Thrombocytosis 01/19/2017  . Primary osteoarthritis of both feet 01/19/2017  . Macrocytic anemia 01/19/2017  . Screening-pulmonary  TB 04/26/2016    Past Medical History:  Diagnosis Date  . Arthritis   . Cancer (HCC)   . History of radiation therapy 05/03/19- 06/25/19   Larynx 35  fractions of 2 Gy each to total 70 Gy  . Hypertension   . Rheumatoid arthritis (HCC)   . Vocal cord mass     Family History  Problem Relation Age of Onset  . Stroke Mother   . Hypertension Daughter    Past Surgical History:  Procedure Laterality Date  . ECTOPIC PREGNANCY SURGERY    . EXTERNAL EAR SURGERY Right    removal of cyst or gland  . IR GASTROSTOMY TUBE MOD SED  04/26/2019  . IR GASTROSTOMY TUBE REMOVAL  11/05/2019  . IR IMAGING GUIDED PORT INSERTION  04/26/2019  . IR REMOVAL TUN ACCESS W/ PORT W/O FL MOD SED  11/05/2019  . MICROLARYNGOSCOPY Left 03/26/2019   Procedure: DIRECT MICROLARYNGOSCOPY WITH BIOPSY OF VOCAL CORD MASS;  Surgeon: Newman Pies, MD;  Location: [facility];  Service: ENT;  Laterality: Left;   Social History   Social History Narrative   Patient was recently widowed in March 2020.   Patient has 1 daughter who lives in [place]   Patient moved to [place] from [place] in January 2009.   Patient is a retired [employer].   Immunization History  Administered Date(s) Administered  . Fluad Quad(high Dose 65+) 05/20/2020  . Influenza, High Dose Seasonal PF 06/09/2017  . PFIZER(Purple Top)SARS-COV-2 Vaccination 08/29/2019, 09/19/2019, 07/11/2020     Objective: Vital Signs: BP 135/80 (BP Location: Right Arm, Patient Position: Sitting, Cuff Size: Normal)   Pulse (!) 102   Ht 5\' 3"  (1.6 m)   Wt 112 lb 12.8 oz (51.2 kg)   BMI 19.98 kg/m    Physical Exam Vitals and nursing note reviewed.  Constitutional:      Appearance: She is well-developed.  HENT:     Head: Normocephalic and atraumatic.  Eyes:     Conjunctiva/sclera: Conjunctivae normal.  Pulmonary:     Effort: Pulmonary effort is normal.  Abdominal:     Palpations: Abdomen is soft.  Musculoskeletal:     Cervical back: Normal range of motion.  Skin:    General: Skin is warm and dry.     Capillary Refill: Capillary refill takes less than 2 seconds.  Neurological:      Mental Status: She is alert and oriented to person, place, and time.  Psychiatric:        Behavior: Behavior normal.      Musculoskeletal Exam: C-spine limited ROM with lateral rotation.  Painful ROM of both shoulder joints.  Limited internal rotation bilaterally.  Limited active ROM of the left shoulder but improved with passive ROM.  Tenderness over both elbow joints. Tenderness and synovitis of the right wrist joint.  Tenderness over the left wrist.  Tenderness and synovitis of multiple MCPs and PIP joints as described below.  Incomplete fist formation bilaterally.  Hip joints good ROM.  Swelling and warmth in both knee joints. Limited extension in the left knee.  Tenderness and warmth over the left ankle joint.   CDAI Exam: CDAI Score: 34.6  Patient Global: 8 mm; Provider Global: 8 mm Swollen: 15 ; Tender: 20  Joint Exam 09/03/2020      Right  Left  Glenohumeral   Tender   Tender  Elbow   Tender   Tender  Wrist  Swollen Tender   Tender  MCP 2  Swollen Tender  Swollen Tender  MCP 3  Swollen Tender  Swollen Tender  MCP 4     Swollen Tender  MCP 5     Swollen Tender  PIP 2  Swollen Tender  Swollen Tender  PIP 3  Swollen Tender  Swollen Tender  PIP 4  Swollen Tender     Knee  Swollen Tender  Swollen Tender  Ankle     Swollen Tender     Investigation: No additional findings.  Imaging: No results found.  Recent Labs: Lab Results  Component Value Date   WBC 6.1 07/08/2020   HGB 10.3 (L) 07/08/2020   PLT 461 (H) 07/08/2020   NA 137 07/08/2020   K 4.2 07/08/2020   CL 102 07/08/2020   CO2 26 07/08/2020   GLUCOSE 99 07/08/2020   BUN 17 07/08/2020   CREATININE 0.93 07/08/2020   BILITOT 0.3 07/08/2020   ALKPHOS 79 05/13/2020   AST 14 07/08/2020   ALT 7 07/08/2020   PROT 6.5 07/08/2020   ALBUMIN 3.4 (L) 05/13/2020   CALCIUM 9.2 07/08/2020   GFRAA 71 07/08/2020   QFTBGOLDPLUS NEGATIVE 08/06/2020    Speciality Comments: PPD- 07/08/17 Negative  Procedures:  No  procedures performed Allergies: Sulfa antibiotics    Assessment / Plan:     Visit Diagnoses: Rheumatoid arthritis, seronegative, multiple sites (HCC) - (D/c MTX 8 tab/wk-elevated creatinine-1.07 on 05/09/20, GFR 60): She has ongoing synovitis and joint tenderness in multiple joints as described above.  She has been experiencing severe pain in her left shoulder, both hands, and both knee joints.  She has not been taking any over-the-counter products and has not taken prednisone recently.  She is currently on Arava 20 mg 1 tablet by mouth daily and Humira 40 mg subcutaneous injections every 14 days.  She was restarted on Humira on 08/06/2020 and has noticed about a 50% improvement in her symptoms.  She previously had a great clinical response to methotrexate and Humira prior to having to discontinue after being diagnosed with vocal cord cancer.  She was in remission for about 1 year while undergoing treatment. She had to discontinue methotrexate due to elevated creatinine.  She has been tolerating Arava without any side effects.  She has not had any recent infections.  X-rays of both hands and feet were updated today to assess for radiographic progression.  X-ray of the left shoulder was also obtained.  She will continue on the current treatment regimen and allow for Humira to have more time to increase efficacy. A prednisone taper starting at 20 mg tapering by 5 mg every 4 days was sent to the pharmacy.  She will follow up in 2-3 months to reassess her response to combination therapy. - Plan: XR Hand 2 View Right, XR Hand 2 View Left, XR Foot 2 Views Left, XR Foot 2 Views Right, XR Shoulder Left  High risk medication use - Arava 20 mg 1 tablet by mouth daily and Humira 40mg  subcutaneous every 14 days (re-started on 08/06/20)  CBC and CMP updated on 07/08/20.  She is due to update CBC and CMP today.  Orders for CBC and CMP released.  Her next lab work will be due in June and every 3 months. Standing orders for CBC  and CMP remain in place. TB gold negative on 08/06/20 and will continue to be monitored yearly.     - Plan: CBC with Differential/Platelet, COMPLETE METABOLIC PANEL WITH GFR She has not had any recent infections.  Advised to  hold both arava and humira if she develops signs or symptoms of an infection and to resume once the infection has completely cleared.   Primary osteoarthritis of both hands: She has PIP and DIP thickening consistent with osteoarthritis of both hands.  She has tenderness and synovitis over the right second, third, fourth PIPs and left second and third PIP joints.  Incomplete fist formation bilaterally. X-rays of both hands were updated today to assess for radiographic progression.   Chronic left shoulder pain -She has been experiencing severe pain in the left shoulder joint.  She has been experiencing nocturnal pain when lying on her left side at night.  She has difficulty with range of motion due to the severity of pain and stiffness.  Her morning stiffness has been lasting several hours despite performing range of motion exercises.  X-rays of the left shoulder were obtained today.  A prednisone taper was sent to the pharmacy.  She was advised to notify us if her discomfort persists or worsens.  Plan: XR Shoulder Left  Primary osteoarthritis of both knees: She presents today with severe pain and ongoing swelling in both knee joints.  On examination she has limited extension of the left knee.  Warmth and swelling in both knee joints were noted.  A prednisone taper starting at 20 mg tapering by 5 mg every 4 days was sent to the pharmacy.  Primary osteoarthritis of both feet: She experiences intermittent pain in both ankle joints.  She has tenderness and warmth over the left ankle joint on examination today.  She continues to experience intermittent pain in both feet.  She is wearing proper fitting shoes. X-rays of both feet were obtained today to assess for radiographic  progression.  Spondylosis without myelopathy or radiculopathy, cervical region -Improved. She noticed a significant improvement in her symptoms after completing physical therapy.  She has continued to perform home exercises.  She has limited ROM with lateral rotation bilaterally. No symptoms of radiculopathy.   Age-related osteoporosis without current pathological fracture - Nov 02, 2019 DEXA showed T score of -3.4 in the lumbar region. She continues to take fosamax 70 mg 1 tablet by mouth once weekly. She is taking vitamin D 2,000 units daily.    History of vitamin D deficiency: She is taking vitamin D 2,000 units daily.   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 Bertis Ruddy and sees ENT.  Thrombocytosis  Abnormal SPEP - followed by oncology.  History of anemia    Orders: Orders Placed This Encounter  Procedures  . XR Hand 2 View Right  . XR Hand 2 View Left  . XR Foot 2 Views Left  . XR Foot 2 Views Right  . XR Shoulder Left  . CBC with Differential/Platelet  . COMPLETE METABOLIC PANEL WITH GFR   Meds ordered this encounter  Medications  . predniSONE (DELTASONE) 5 MG tablet    Sig: Take 4 tablets by mouth daily x4 days, 3 tablets by mouth daily x4 days, 2 tablets by mouth daily x4 days, 1 tablet by mouth x4 days.    Dispense:  40 tablet    Refill:  0     Follow-Up Instructions: Return in about 3 months (around 12/04/2020) for Rheumatoid arthritis, Osteoarthritis, Osteoporosis.   Gearldine Bienenstock, PA-C  Note - This record has been created using Dragon software.  Chart creation errors have been sought, but may  not always  have been located. Such creation errors do not reflect on  the standard of medical care.

## 2020-08-28 ENCOUNTER — Other Ambulatory Visit: Payer: Self-pay | Admitting: Physician Assistant

## 2020-08-28 NOTE — Telephone Encounter (Signed)
Next Visit: 09/03/2020  Last Visit: 07/09/2020  Last Fill: 06/03/2020  DX: Rheumatoid arthritis, seronegative, multiple sites   Current Dose per office note 07/09/2020, Arava 20 mg 1 tablet by mouth daily  Labs: 07/08/2020, RBC 3.38, Hemoglobin 10.3, HCT 31.9, Platelets 461, Lymphs Abs 628, Eosinophils Absolute 12  Okay to refill Arava?

## 2020-09-02 ENCOUNTER — Other Ambulatory Visit: Payer: Self-pay

## 2020-09-02 DIAGNOSIS — Z79899 Other long term (current) drug therapy: Secondary | ICD-10-CM

## 2020-09-03 ENCOUNTER — Encounter: Payer: Self-pay | Admitting: Physician Assistant

## 2020-09-03 ENCOUNTER — Ambulatory Visit: Payer: Self-pay

## 2020-09-03 ENCOUNTER — Other Ambulatory Visit: Payer: Self-pay

## 2020-09-03 ENCOUNTER — Ambulatory Visit: Payer: Medicare Other | Admitting: Physician Assistant

## 2020-09-03 VITALS — BP 135/80 | HR 102 | Ht 63.0 in | Wt 112.8 lb

## 2020-09-03 DIAGNOSIS — M47812 Spondylosis without myelopathy or radiculopathy, cervical region: Secondary | ICD-10-CM

## 2020-09-03 DIAGNOSIS — Z79899 Other long term (current) drug therapy: Secondary | ICD-10-CM | POA: Diagnosis not present

## 2020-09-03 DIAGNOSIS — D75839 Thrombocytosis, unspecified: Secondary | ICD-10-CM

## 2020-09-03 DIAGNOSIS — Z862 Personal history of diseases of the blood and blood-forming organs and certain disorders involving the immune mechanism: Secondary | ICD-10-CM

## 2020-09-03 DIAGNOSIS — R778 Other specified abnormalities of plasma proteins: Secondary | ICD-10-CM

## 2020-09-03 DIAGNOSIS — G8929 Other chronic pain: Secondary | ICD-10-CM | POA: Diagnosis not present

## 2020-09-03 DIAGNOSIS — M19072 Primary osteoarthritis, left ankle and foot: Secondary | ICD-10-CM | POA: Diagnosis not present

## 2020-09-03 DIAGNOSIS — M19071 Primary osteoarthritis, right ankle and foot: Secondary | ICD-10-CM | POA: Diagnosis not present

## 2020-09-03 DIAGNOSIS — M170 Bilateral primary osteoarthritis of knee: Secondary | ICD-10-CM

## 2020-09-03 DIAGNOSIS — M19042 Primary osteoarthritis, left hand: Secondary | ICD-10-CM | POA: Diagnosis not present

## 2020-09-03 DIAGNOSIS — M19041 Primary osteoarthritis, right hand: Secondary | ICD-10-CM | POA: Diagnosis not present

## 2020-09-03 DIAGNOSIS — M25512 Pain in left shoulder: Secondary | ICD-10-CM

## 2020-09-03 DIAGNOSIS — M0609 Rheumatoid arthritis without rheumatoid factor, multiple sites: Secondary | ICD-10-CM | POA: Diagnosis not present

## 2020-09-03 DIAGNOSIS — M542 Cervicalgia: Secondary | ICD-10-CM

## 2020-09-03 DIAGNOSIS — Z8639 Personal history of other endocrine, nutritional and metabolic disease: Secondary | ICD-10-CM

## 2020-09-03 DIAGNOSIS — Z780 Asymptomatic menopausal state: Secondary | ICD-10-CM

## 2020-09-03 DIAGNOSIS — C320 Malignant neoplasm of glottis: Secondary | ICD-10-CM

## 2020-09-03 DIAGNOSIS — M810 Age-related osteoporosis without current pathological fracture: Secondary | ICD-10-CM

## 2020-09-03 LAB — COMPLETE METABOLIC PANEL WITH GFR
AG Ratio: 1.2 (calc) (ref 1.0–2.5)
ALT: 5 U/L — ABNORMAL LOW (ref 6–29)
AST: 12 U/L (ref 10–35)
Albumin: 3.6 g/dL (ref 3.6–5.1)
Alkaline phosphatase (APISO): 75 U/L (ref 37–153)
BUN: 14 mg/dL (ref 7–25)
CO2: 19 mmol/L — ABNORMAL LOW (ref 20–32)
Calcium: 9.3 mg/dL (ref 8.6–10.4)
Chloride: 106 mmol/L (ref 98–110)
Creat: 0.7 mg/dL (ref 0.60–0.93)
GFR, Est African American: 100 mL/min/{1.73_m2} (ref >=60)
GFR, Est Non African American: 87 mL/min/{1.73_m2} (ref >=60)
Globulin: 2.9 g/dL (calc) (ref 1.9–3.7)
Glucose, Bld: 105 mg/dL — ABNORMAL HIGH (ref 65–99)
Potassium: 4.1 mmol/L (ref 3.5–5.3)
Sodium: 137 mmol/L (ref 135–146)
Total Bilirubin: 0.2 mg/dL (ref 0.2–1.2)
Total Protein: 6.5 g/dL (ref 6.1–8.1)

## 2020-09-03 LAB — CBC WITH DIFFERENTIAL/PLATELET
Absolute Monocytes: 529 cells/uL (ref 200–950)
Basophils Absolute: 20 cells/uL (ref 0–200)
Basophils Relative: 0.3 %
Eosinophils Absolute: 20 cells/uL (ref 15–500)
Eosinophils Relative: 0.3 %
HCT: 27.6 % — ABNORMAL LOW (ref 35.0–45.0)
Hemoglobin: 8.5 g/dL — ABNORMAL LOW (ref 11.7–15.5)
Lymphs Abs: 590 cells/uL — ABNORMAL LOW (ref 850–3900)
MCH: 28 pg (ref 27.0–33.0)
MCHC: 30.8 g/dL — ABNORMAL LOW (ref 32.0–36.0)
MCV: 90.8 fL (ref 80.0–100.0)
MPV: 9.1 fL (ref 7.5–12.5)
Monocytes Relative: 7.9 %
Neutro Abs: 5541 cells/uL (ref 1500–7800)
Neutrophils Relative %: 82.7 %
Platelets: 583 10*3/uL — ABNORMAL HIGH (ref 140–400)
RBC: 3.04 10*6/uL — ABNORMAL LOW (ref 3.80–5.10)
RDW: 14.4 % (ref 11.0–15.0)
Total Lymphocyte: 8.8 %
WBC: 6.7 10*3/uL (ref 3.8–10.8)

## 2020-09-03 MED ORDER — PREDNISONE 5 MG PO TABS: ORAL_TABLET | ORAL | 0 refills | Status: DC

## 2020-09-03 NOTE — Progress Notes (Signed)
Please call the patient with x-rays results.  X-rays of the left shoulder were unremarkable.   Please advise the patient to take prednisone as prescribed.  She can notify us if her shoulder pain does not improve and we can refer her to PT at that time, but right now PT may make her pain worse until her RA is better controlled.

## 2020-09-03 NOTE — Patient Instructions (Signed)
Standing Labs We placed an order today for your standing lab work.   Please have your standing labs drawn in June and every 3 months   If possible, please have your labs drawn 2 weeks prior to your appointment so that the provider can discuss your results at your appointment.  We have open lab daily Monday through Thursday from 1:30-4:30 PM and Friday from 1:30-4:00 PM at the office of Dr. Shaili Deveshwar, [facility].   Please be advised, all patients with office appointments requiring lab work will take precedents over walk-in lab work.  If possible, please come for your lab work on Monday and Friday afternoons, as you may experience shorter wait times. The office is located at [address], [address], [place], [place] [address] No appointment is necessary.   Labs are drawn by [facility]. Please bring your co-pay at the time of your lab draw.  You may receive a bill from [facility] for your lab work.  If you wish to have your labs drawn at another location, please call the office 24 hours in advance to send orders.  If you have any questions regarding directions or hours of operation,  please call [phone].   As a reminder, please drink plenty of water prior to coming for your lab work. Thanks!   

## 2020-09-03 NOTE — Progress Notes (Signed)
X-rays of both hands and both feet were consistent with osteoarthritis and rheumatoid arthritis overlap.  No erosive changes. No images were available for comparison. We will obtain updated x-rays in 2-3 years to assess for radiographic progression.

## 2020-09-04 NOTE — Progress Notes (Signed)
RBC count, hemoglobin, and hematocrit are very low and are continuing to trend down. Plt count is elevated and trending up.  Absolute lymphocytes are low and trending down.  Please notify the patient and advise her to follow up with hem/onc.  Please forward lab work to her oncologist and PCP.

## 2020-09-05 ENCOUNTER — Telehealth: Payer: Self-pay

## 2020-09-05 NOTE — Telephone Encounter (Signed)
Called and scheduled for appt on [phone] at [phone]. She is aware of appt time.

## 2020-09-08 ENCOUNTER — Inpatient Hospital Stay: Payer: Medicare Other | Attending: Hematology and Oncology | Admitting: Hematology and Oncology

## 2020-09-08 ENCOUNTER — Encounter: Payer: Self-pay | Admitting: Hematology and Oncology

## 2020-09-08 ENCOUNTER — Inpatient Hospital Stay: Payer: Medicare Other

## 2020-09-08 ENCOUNTER — Other Ambulatory Visit: Payer: Self-pay

## 2020-09-08 VITALS — BP 139/62 | HR 82 | Temp 97.9°F | Resp 18 | Ht 63.0 in | Wt 112.4 lb

## 2020-09-08 DIAGNOSIS — E039 Hypothyroidism, unspecified: Secondary | ICD-10-CM | POA: Diagnosis not present

## 2020-09-08 DIAGNOSIS — D509 Iron deficiency anemia, unspecified: Secondary | ICD-10-CM | POA: Insufficient documentation

## 2020-09-08 DIAGNOSIS — C320 Malignant neoplasm of glottis: Secondary | ICD-10-CM | POA: Diagnosis not present

## 2020-09-08 DIAGNOSIS — K2900 Acute gastritis without bleeding: Secondary | ICD-10-CM | POA: Diagnosis not present

## 2020-09-08 DIAGNOSIS — K2970 Gastritis, unspecified, without bleeding: Secondary | ICD-10-CM | POA: Insufficient documentation

## 2020-09-08 DIAGNOSIS — D539 Nutritional anemia, unspecified: Secondary | ICD-10-CM | POA: Diagnosis not present

## 2020-09-08 DIAGNOSIS — D508 Other iron deficiency anemias: Secondary | ICD-10-CM

## 2020-09-08 DIAGNOSIS — D638 Anemia in other chronic diseases classified elsewhere: Secondary | ICD-10-CM | POA: Diagnosis not present

## 2020-09-08 DIAGNOSIS — D472 Monoclonal gammopathy: Secondary | ICD-10-CM | POA: Diagnosis not present

## 2020-09-08 LAB — CBC WITH DIFFERENTIAL/PLATELET
Abs Immature Granulocytes: 0.03 10*3/uL (ref 0.00–0.07)
Basophils Absolute: 0 10*3/uL (ref 0.0–0.1)
Basophils Relative: 0 %
Eosinophils Absolute: 0 10*3/uL (ref 0.0–0.5)
Eosinophils Relative: 1 %
HCT: 29.5 % — ABNORMAL LOW (ref 36.0–46.0)
Hemoglobin: 8.9 g/dL — ABNORMAL LOW (ref 12.0–15.0)
Immature Granulocytes: 0 %
Lymphocytes Relative: 13 %
Lymphs Abs: 1.1 10*3/uL (ref 0.7–4.0)
MCH: 28 pg (ref 26.0–34.0)
MCHC: 30.2 g/dL (ref 30.0–36.0)
MCV: 92.8 fL (ref 80.0–100.0)
Monocytes Absolute: 0.8 10*3/uL (ref 0.1–1.0)
Monocytes Relative: 9 %
Neutro Abs: 6.8 10*3/uL (ref 1.7–7.7)
Neutrophils Relative %: 77 %
Platelets: 566 10*3/uL — ABNORMAL HIGH (ref 150–400)
RBC: 3.18 MIL/uL — ABNORMAL LOW (ref 3.87–5.11)
RDW: 17.4 % — ABNORMAL HIGH (ref 11.5–15.5)
WBC: 8.7 10*3/uL (ref 4.0–10.5)
nRBC: 0.2 % (ref 0.0–0.2)

## 2020-09-08 LAB — IRON AND TIBC
Iron: 60 ug/dL (ref 41–142)
Saturation Ratios: 22 % (ref 21–57)
TIBC: 273 ug/dL (ref 236–444)
UIBC: 213 ug/dL (ref 120–384)

## 2020-09-08 LAB — FERRITIN: Ferritin: 20 ng/mL (ref 11–307)

## 2020-09-08 MED ORDER — OMEPRAZOLE 40 MG PO CPDR: 40.0000 mg | DELAYED_RELEASE_CAPSULE | Freq: Every day | ORAL | 3 refills | Status: DC

## 2020-09-08 NOTE — Assessment & Plan Note (Signed)
I did not detect any abnormal M protein or abnormal light chain ratio However, on further questioning, she was placed on prednisone recently that could partially treat the problem I plan to recheck MGUS panel again at the end of the year, around November 2022

## 2020-09-08 NOTE — Assessment & Plan Note (Signed)
She has multifactorial anemia, a component of anemia chronic illness and iron deficiency She has extreme fatigue from this  The most likely cause of her anemia is due to chronic blood loss/malabsorption syndrome. We discussed some of the risks, benefits, and alternatives of intravenous iron infusions. The patient is symptomatic from anemia and the iron level is critically low. She tolerated oral iron supplement poorly and desires to achieved higher levels of iron faster for adequate hematopoesis. Some of the side-effects to be expected including risks of infusion reactions, phlebitis, headaches, nausea and fatigue.  The patient is willing to proceed. Patient education material was dispensed.  Goal is to keep ferritin level greater than 50 and resolution of anemia I will schedule IV iron sucrose x3 I plan to recheck iron studies again in May

## 2020-09-08 NOTE — Progress Notes (Signed)
[place] OFFICE PROGRESS NOTE  Patient Care Team: Charolette Forward, MD as PCP - General (Cardiology) Charolette Forward, MD as Referring Physician (Cardiology) Eppie Gibson, MD as Attending Physician (Radiation Oncology) Leota Sauers, RN (Inactive) as Oncology Nurse Navigator Malmfelt, Stephani Police, RN as Oncology Nurse Navigator (Oncology)  ASSESSMENT & PLAN:  Deficiency anemia She has multifactorial anemia, a component of anemia chronic illness and iron deficiency She has extreme fatigue from this  The most likely cause of her anemia is due to chronic blood loss/malabsorption syndrome. We discussed some of the risks, benefits, and alternatives of intravenous iron infusions. The patient is symptomatic from anemia and the iron level is critically low. She tolerated oral iron supplement poorly and desires to achieved higher levels of iron faster for adequate hematopoesis. Some of the side-effects to be expected including risks of infusion reactions, phlebitis, headaches, nausea and fatigue.  The patient is willing to proceed. Patient education material was dispensed.  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 [facility]) She has no signs or symptoms of cancer recurrence We discussed the importance of ENT follow-up for surveillance I will see her again in May as scheduled We discussed the importance of TSH monitoring due to radiation exposure  MGUS (monoclonal gammopathy of unknown significance) I did not detect any abnormal M protein or abnormal light chain ratio However, on further questioning, she was placed on prednisone recently that could partially treat the problem I plan to recheck MGUS panel again at the end of the year, around November 2022   Orders Placed This Encounter  Procedures  . CBC with Differential/Platelet     Standing Status:   Future    Number of Occurrences:   1    Standing Expiration Date:   09/08/2021  . Iron and TIBC    Standing Status:   Future    Number of Occurrences:   1    Standing Expiration Date:   09/08/2021  . Ferritin    Standing Status:   Future    Number of Occurrences:   1    Standing Expiration Date:   09/08/2021  . Comprehensive metabolic panel    Standing Status:   Future    Standing Expiration Date:   09/08/2021  . CBC with Differential/Platelet    Standing Status:   Future    Standing Expiration Date:   09/08/2021  . Ferritin    Standing Status:   Future    Standing Expiration Date:   09/08/2021  . Iron and TIBC    Standing Status:   Future    Standing Expiration Date:   09/08/2021  . TSH    Standing Status:   Future    Standing Expiration Date:   09/08/2021    All questions were answered. The patient knows to call the clinic with any problems, questions or concerns. The total time spent in the appointment was 30 minutes encounter with patients including review of chart and various tests results, discussions about plan of care and coordination of care plan   [name], MD 09/08/2020 1:32 PM  INTERVAL HISTORY: Please see below for problem oriented charting. She is seen urgently due to recent abnormal blood work The patient has severe acute flare of rheumatoid arthritis She was recently placed on a course of prednisone therapy She  is receiving Arava as well as Humira She is noted to have excessive fatigue Recent blood count check show progressive anemia The patient denies any recent signs or symptoms of bleeding such as spontaneous epistaxis, hematuria or hematochezia. 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 VOCAL CORD MASS, LEFT, EXCISION:  - Squamous cell carcinoma.  - See comment.    04/06/2019 Initial Diagnosis   Squamous cell  carcinoma of left vocal cord ([place])   04/10/2019 Imaging   PET: 1. Left glottic lesion is hypermetabolic with maximum SUV 8.9, compatible with malignancy. No discrete adenopathy in the neck. 2. Extensive periarticular activity especially around the large joints, likely related to rheumatoid arthritis. 3. Small but hypermetabolic axillary and left external iliac lymph nodes are most likely reactive to the patient's rheumatoid arthritis, but merit surveillance. 4. Mild right hydronephrosis without hydroureter, query right UPJ narrowing. Consider follow renal sonography. 5. Prominent but not hypermetabolic left adnexa, possibly due to prominent ovary or left eccentricity of the uterus, correlate with surgical history. This could be further investigated with pelvic sonography if clinically warranted. 6. Other imaging findings of potential clinical significance: Chronic left maxillary sinusitis. Aortic Atherosclerosis (ICD10-I70.0). Coronary atherosclerosis. Emphysema (ICD10-J43.9). Sigmoid colon diverticulosis.   04/10/2019 Imaging   CT neck w/ contrast:  1. Left glottic mass extending to but not beyond the anterior commissure. There is extension into the left paraglottic fat without cartilage erosion. 2. No adenopathy. 3. Aortic Atherosclerosis (ICD10-I70.0) and Emphysema (ICD10-J43.9).   04/11/2019 Cancer Staging   Staging form: Larynx - Glottis, AJCC 8th Edition - Clinical stage from 04/11/2019: Stage III (cT3, cN0, cM0) - Signed by Eppie Gibson, MD on 04/11/2019   05/03/2019 - 06/29/2019 Chemotherapy   The patient had cisplatin for chemotherapy treatment.     10/08/2019 PET scan   1. Complete metabolic response to therapy of left-sided glottic primary. 2. Suboptimal evaluation for cervical nodal disease secondary to extensive hypermetabolic "brown" fat within the neck and chest. Given this limitation, no evidence of metastatic disease. 3. Decreased size of hypermetabolic mediastinal node,  likely reactive. 4. Aortic atherosclerosis (ICD10-I70.0), coronary artery atherosclerosis and emphysema (ICD10-J43.9). 5. Similar appearance of the right kidney, possibly related to chronic ureteropelvic junction obstruction versus pyelonephritis. 6. Left nephrolithiasis.   11/05/2019 Procedure   Successful right IJ vein Port-A-Cath explantation.   Successful removal of percutaneous gastrostomy tube     REVIEW OF SYSTEMS:   Constitutional: Denies fevers, chills or abnormal weight loss Eyes: Denies blurriness of vision Ears, nose, mouth, throat, and face: Denies mucositis or sore throat Respiratory: Denies cough, dyspnea or wheezes Cardiovascular: Denies palpitation, chest discomfort or lower extremity swelling Gastrointestinal:  Denies nausea, heartburn or change in bowel habits Skin: Denies abnormal skin rashes Lymphatics: Denies new lymphadenopathy or easy bruising Neurological:Denies numbness, tingling or new weaknesses Behavioral/Psych: Mood is stable, no new changes  All other systems were reviewed with the patient and are negative.  I have reviewed the past medical history, past surgical history, social history and family history with the patient and they are unchanged from previous note.  ALLERGIES:  is allergic to sulfa antibiotics.  MEDICATIONS:  Current Outpatient Medications  Medication Sig Dispense Refill  . omeprazole (PRILOSEC) 40 MG capsule Take 1 capsule (40 mg total) by mouth daily. 90 capsule 3  . Adalimumab 40 MG/0.4ML PNKT Inject 40 mg into the skin every 14 (fourteen)  days.    . alendronate (FOSAMAX) 70 MG tablet TAKE 1 TABLET(70 MG) BY MOUTH 1 TIME A WEEK WITH A FULL GLASS OF WATER AND ON AN EMPTY STOMACH 12 tablet 0  . Cholecalciferol (VITAMIN D) 50 MCG (2000 UT) CAPS Take 2,000 Units by mouth daily.     Mariane Baumgarten Calcium (STOOL SOFTENER PO) Take by mouth.    . folic acid (FOLVITE) 1 MG tablet Take 2 mg by mouth daily.    Marland Kitchen leflunomide (ARAVA) 20 MG tablet  TAKE 1 TABLET(20 MG) BY MOUTH DAILY 90 tablet 0  . losartan (COZAAR) 100 MG tablet Take 100 mg by mouth daily.   0  . metoprolol succinate (TOPROL-XL) 50 MG 24 hr tablet Take 50 mg by mouth daily.    . predniSONE (DELTASONE) 5 MG tablet Take 4 tablets by mouth daily x4 days, 3 tablets by mouth daily x4 days, 2 tablets by mouth daily x4 days, 1 tablet by mouth x4 days. 40 tablet 0  . Red Yeast Rice 500 MG/0.5GM POWD Take 600 mg by mouth every other day.    . vitamin B-12 (CYANOCOBALAMIN) 1000 MCG tablet Take 1,000 mcg by mouth daily.     No current facility-administered medications for this visit.    PHYSICAL EXAMINATION: ECOG PERFORMANCE STATUS: 1 - Symptomatic but completely ambulatory  Vitals:   09/08/20 0926  BP: 139/62  Pulse: 82  Resp: 18  Temp: 97.9 F (36.6 C)  SpO2: 100%   Filed Weights   09/08/20 0926  Weight: 112 lb 6.4 oz (51 kg)    GENERAL:alert, no distress and comfortable Musculoskeletal:no cyanosis of digits and no clubbing  NEURO: alert & oriented x 3 with fluent speech, no focal motor/sensory deficits  LABORATORY DATA:  I have reviewed the data as listed    Component Value Date/Time   NA 137 09/03/2020 1045   NA 141 03/05/2014 1050   K 4.1 09/03/2020 1045   K 3.9 03/05/2014 1050   CL 106 09/03/2020 1045   CO2 19 (L) 09/03/2020 1045   CO2 24 03/05/2014 1050   GLUCOSE 105 (H) 09/03/2020 1045   GLUCOSE 99 03/05/2014 1050   BUN 14 09/03/2020 1045   BUN 10.6 03/05/2014 1050   CREATININE 0.70 09/03/2020 1045   CREATININE 0.8 03/05/2014 1050   CALCIUM 9.3 09/03/2020 1045   CALCIUM 8.8 03/05/2014 1050   PROT 6.5 09/03/2020 1045   PROT 6.5 03/05/2014 1050   ALBUMIN 3.4 (L) 05/13/2020 0857   ALBUMIN 3.2 (L) 03/05/2014 1050   AST 12 09/03/2020 1045   AST 13 (L) 10/10/2019 1402   AST 12 03/05/2014 1050   ALT 5 (L) 09/03/2020 1045   ALT 10 10/10/2019 1402   ALT 28 03/05/2014 1050   ALKPHOS 79 05/13/2020 0857   ALKPHOS 85 03/05/2014 1050   BILITOT 0.2  09/03/2020 1045   BILITOT <0.2 (L) 10/10/2019 1402   BILITOT 0.20 03/05/2014 1050   GFRNONAA 87 09/03/2020 1045   GFRAA 100 09/03/2020 1045    No results found for: SPEP, UPEP  Lab Results  Component Value Date   WBC 8.7 09/08/2020   NEUTROABS 6.8 09/08/2020   HGB 8.9 (L) 09/08/2020   HCT 29.5 (L) 09/08/2020   MCV 92.8 09/08/2020   PLT 566 (H) 09/08/2020      Chemistry      Component Value Date/Time   NA 137 09/03/2020 1045   NA 141 03/05/2014 1050   K 4.1 09/03/2020 1045   K 3.9 03/05/2014  1050   CL 106 09/03/2020 1045   CO2 19 (L) 09/03/2020 1045   CO2 24 03/05/2014 1050   BUN 14 09/03/2020 1045   BUN 10.6 03/05/2014 1050   CREATININE 0.70 09/03/2020 1045   CREATININE 0.8 03/05/2014 1050      Component Value Date/Time   CALCIUM 9.3 09/03/2020 1045   CALCIUM 8.8 03/05/2014 1050   ALKPHOS 79 05/13/2020 0857   ALKPHOS 85 03/05/2014 1050   AST 12 09/03/2020 1045   AST 13 (L) 10/10/2019 1402   AST 12 03/05/2014 1050   ALT 5 (L) 09/03/2020 1045   ALT 10 10/10/2019 1402   ALT 28 03/05/2014 1050   BILITOT 0.2 09/03/2020 1045   BILITOT <0.2 (L) 10/10/2019 1402   BILITOT 0.20 03/05/2014 1050       RADIOGRAPHIC STUDIES: I have personally reviewed the radiological images as listed and agreed with the findings in the report. XR Foot 2 Views Left  Result Date: 09/03/2020 Juxta-articular osteopenia was noted.  First MTP, PIP and DIP narrowing was noted.  No intertarsal joint space narrowing was noted.  Tibiotalar and subtalar joint space narrowing was noted.  No erosive changes were noted. Impression: These findings are consistent with rheumatoid arthritis and osteoarthritis overlap.  XR Foot 2 Views Right  Result Date: 09/03/2020 Juxta-articular osteopenia was noted.  First MTP, PIP and DIP narrowing was noted.  No intertarsal joint space narrowing was noted.  Tibiotalar and subtalar joint space narrowing was noted.  No erosive changes were noted. Impression: These  findings are consistent with rheumatoid arthritis and osteoarthritis overlap.  XR Hand 2 View Left  Result Date: 09/03/2020 Juxta-articular osteopenia was noted.  PIP and DIP narrowing was noted.  No significant intercarpal or radiocarpal joint space narrowing was noted.  No erosive changes were noted. Impression: These findings are consistent with inflammatory arthritis and osteoarthritis overlap.  XR Hand 2 View Right  Result Date: 09/03/2020 Juxta-articular osteopenia was noted.  PIP and DIP narrowing was noted.  No significant intercarpal or radiocarpal joint space narrowing was noted.  No erosive changes were noted. Impression: These findings are consistent with inflammatory arthritis and osteoarthritis overlap.  XR Shoulder Left  Result Date: 09/03/2020 No glenohumeral or acromioclavicular joint space narrowing was noted.  No chondrocalcinosis was noted. Impression: Unremarkable x-ray of the shoulder joint.

## 2020-09-08 NOTE — Assessment & Plan Note (Signed)
She has no signs or symptoms of cancer recurrence We discussed the importance of ENT follow-up for surveillance I will see her again in May as scheduled We discussed the importance of TSH monitoring due to radiation exposure

## 2020-09-24 ENCOUNTER — Other Ambulatory Visit: Payer: Self-pay

## 2020-09-24 ENCOUNTER — Inpatient Hospital Stay: Payer: Medicare Other | Attending: Hematology and Oncology

## 2020-09-24 VITALS — BP 112/90 | HR 73 | Temp 98.7°F | Resp 18

## 2020-09-24 DIAGNOSIS — K2900 Acute gastritis without bleeding: Secondary | ICD-10-CM

## 2020-09-24 DIAGNOSIS — D509 Iron deficiency anemia, unspecified: Secondary | ICD-10-CM | POA: Diagnosis not present

## 2020-09-24 DIAGNOSIS — D539 Nutritional anemia, unspecified: Secondary | ICD-10-CM

## 2020-09-24 DIAGNOSIS — D508 Other iron deficiency anemias: Secondary | ICD-10-CM

## 2020-09-24 DIAGNOSIS — Z95828 Presence of other vascular implants and grafts: Secondary | ICD-10-CM

## 2020-09-24 MED ORDER — SODIUM CHLORIDE 0.9 % IV SOLN
Freq: Once | INTRAVENOUS | Status: AC
Start: 2020-09-24 — End: 2020-09-24
  Filled 2020-09-24: qty 250

## 2020-09-24 MED ORDER — IRON SUCROSE 20 MG/ML IV SOLN
200.0000 mg | Freq: Once | INTRAVENOUS | Status: AC
  Administered 2020-09-24: 200 mg via INTRAVENOUS
  Filled 2020-09-24: qty 200

## 2020-09-24 NOTE — Patient Instructions (Signed)

## 2020-09-25 ENCOUNTER — Other Ambulatory Visit: Payer: Self-pay | Admitting: Physician Assistant

## 2020-09-25 NOTE — Telephone Encounter (Signed)
Next Visit: 6/17/202  Last Visit: 09/03/2020  Last Fill: 06/30/2020  DX: Rheumatoid arthritis, seronegative, multiple sites   Current Dose per office note 09/03/2020, fosamax 70 mg 1 tablet by mouth once weekly  Labs: 09/08/2020 CBC, RBC 3.18, hemoglobin 8.9, HCT 29.5, RDW 17.4, Platelets 566,  09/03/2020, RBC count, hemoglobin, and hematocrit are very low and are continuing to trend down. Plt count is elevated and trending up. Absolute lymphocytes are low and trending down. Please notify the patient and advise her to follow up with hem/onc. Please forward lab work to her oncologist and PCP.   Okay to refill Fosamax?

## 2020-10-01 ENCOUNTER — Ambulatory Visit: Payer: Medicare Other | Admitting: Physician Assistant

## 2020-10-02 ENCOUNTER — Other Ambulatory Visit: Payer: Self-pay

## 2020-10-02 ENCOUNTER — Inpatient Hospital Stay: Payer: Medicare Other

## 2020-10-02 VITALS — BP 149/81 | HR 84 | Temp 98.1°F | Resp 17

## 2020-10-02 DIAGNOSIS — D508 Other iron deficiency anemias: Secondary | ICD-10-CM

## 2020-10-02 DIAGNOSIS — Z95828 Presence of other vascular implants and grafts: Secondary | ICD-10-CM

## 2020-10-02 DIAGNOSIS — D539 Nutritional anemia, unspecified: Secondary | ICD-10-CM

## 2020-10-02 DIAGNOSIS — K2900 Acute gastritis without bleeding: Secondary | ICD-10-CM

## 2020-10-02 DIAGNOSIS — D509 Iron deficiency anemia, unspecified: Secondary | ICD-10-CM | POA: Diagnosis not present

## 2020-10-02 MED ORDER — SODIUM CHLORIDE 0.9 % IV SOLN
200.0000 mg | Freq: Once | INTRAVENOUS | Status: AC
  Administered 2020-10-02: 200 mg via INTRAVENOUS
  Filled 2020-10-02: qty 200

## 2020-10-02 MED ORDER — SODIUM CHLORIDE 0.9 % IV SOLN
Freq: Once | INTRAVENOUS | Status: DC
  Filled 2020-10-02: qty 250

## 2020-10-02 NOTE — Patient Instructions (Signed)

## 2020-10-06 ENCOUNTER — Telehealth: Payer: Self-pay | Admitting: *Deleted

## 2020-10-06 NOTE — Telephone Encounter (Signed)
CALLED PATIENT TO ALTER FU DUE TO DR. SQUIRE HAVING AN EMERGENCY, RESCHEDULED FOR 11-05-20, LVM FOR A RETURN CALL

## 2020-10-08 ENCOUNTER — Ambulatory Visit: Payer: Self-pay | Admitting: Radiation Oncology

## 2020-10-09 ENCOUNTER — Other Ambulatory Visit: Payer: Self-pay

## 2020-10-09 ENCOUNTER — Inpatient Hospital Stay: Payer: Medicare Other

## 2020-10-09 VITALS — BP 150/70 | HR 76 | Temp 97.7°F | Resp 18

## 2020-10-09 DIAGNOSIS — D508 Other iron deficiency anemias: Secondary | ICD-10-CM

## 2020-10-09 DIAGNOSIS — Z95828 Presence of other vascular implants and grafts: Secondary | ICD-10-CM

## 2020-10-09 DIAGNOSIS — K2900 Acute gastritis without bleeding: Secondary | ICD-10-CM

## 2020-10-09 DIAGNOSIS — D509 Iron deficiency anemia, unspecified: Secondary | ICD-10-CM | POA: Diagnosis not present

## 2020-10-09 DIAGNOSIS — D539 Nutritional anemia, unspecified: Secondary | ICD-10-CM

## 2020-10-09 MED ORDER — SODIUM CHLORIDE 0.9 % IV SOLN
200.0000 mg | Freq: Once | INTRAVENOUS | Status: AC
  Administered 2020-10-09: 200 mg via INTRAVENOUS
  Filled 2020-10-09: qty 200

## 2020-10-09 MED ORDER — SODIUM CHLORIDE 0.9 % IV SOLN
Freq: Once | INTRAVENOUS | Status: AC
Start: 2020-10-09 — End: 2020-10-09
  Filled 2020-10-09: qty 250

## 2020-10-09 NOTE — Patient Instructions (Signed)

## 2020-10-09 NOTE — Progress Notes (Signed)
Patient was observed for 30 minutes post infusion with no complaints. Upon departure, vitals stable and patient in no distress.

## 2020-10-10 ENCOUNTER — Other Ambulatory Visit: Payer: Self-pay | Admitting: *Deleted

## 2020-10-10 MED ORDER — ADALIMUMAB 40 MG/0.4ML ~~LOC~~ AJKT: 40.0000 mg | AUTO-INJECTOR | SUBCUTANEOUS | 0 refills | Status: DC

## 2020-10-10 NOTE — Telephone Encounter (Signed)
RX faxed from [facility]  Next Visit: 12/05/2020  Last Visit: 09/03/2020  Last Fill: 08/06/2020  DX: Rheumatoid arthritis, seronegative, multiple sites  Current Dose per office note 09/03/2020, Humira 40mg  subcutaneous every 14 days   Labs: 09/08/2020, CBC RBC 3.18, Hemoglobin 8.9, HCT 29.5, RDW 17.4, Platelets 566,   09/03/2020 CMP RBC count, hemoglobin, and hematocrit are very low and are continuing to trend down. Plt count is elevated and trending up. Absolute lymphocytes are low and trending down. Please notify the patient and advise her to follow up with hem/onc. Please forward lab work to her oncologist and PCP.   TB Gold: 08/06/2020, negative  Okay to refill Humira?

## 2020-11-05 ENCOUNTER — Other Ambulatory Visit: Payer: Self-pay

## 2020-11-05 ENCOUNTER — Ambulatory Visit
Admission: RE | Admit: 2020-11-05 | Discharge: 2020-11-05 | Disposition: A | Discharge status: Home / Self Care | Payer: Medicare Other | Source: Ambulatory Visit | Attending: Radiation Oncology | Admitting: Radiation Oncology

## 2020-11-05 VITALS — BP 131/72 | HR 101 | Temp 97.5°F | Resp 20 | Ht 63.0 in | Wt 111.4 lb

## 2020-11-05 DIAGNOSIS — C320 Malignant neoplasm of glottis: Secondary | ICD-10-CM | POA: Diagnosis present

## 2020-11-05 DIAGNOSIS — M069 Rheumatoid arthritis, unspecified: Secondary | ICD-10-CM | POA: Diagnosis not present

## 2020-11-05 DIAGNOSIS — Z79899 Other long term (current) drug therapy: Secondary | ICD-10-CM | POA: Diagnosis not present

## 2020-11-05 DIAGNOSIS — Z923 Personal history of irradiation: Secondary | ICD-10-CM | POA: Insufficient documentation

## 2020-11-05 NOTE — Progress Notes (Addendum)
Ms. Dioguardi presents today for follow-up after completing raditation to her glottis and left vocal cord on 06/25/2019  Pain issues, if any: Patient denies and neck/throat pain, but is dealing with a rheumatoid arthritis flare up Using a feeding tube?: N/A--removed 11/05/19 Weight changes, if any: Reports decreased appetite Wt Readings from Last 3 Encounters:  11/05/20 111 lb 6.4 oz (50.5 kg)  09/08/20 112 lb 6.4 oz (51 kg)  09/03/20 112 lb 12.8 oz (51.2 kg)   Swallowing issues, if any: Patient denies. Reports she can eat a wide variety and denies any issues with thin liquids Smoking or chewing tobacco? None Using fluoride trays daily? Uses flouride toothpaste and sees a dentist regularly; denies any dental concerns Last ENT visit was on: Reports she saw Dr. Leta Baptist last week. States he performed a laryngoscopy and stated everything looked good Other notable issues, if any: Denies any ear or jaw pain, or difficulty opening her mouth fully. Denies any signs/symptoms of lymphedema to her chin or neck. Reports minimal dry mouth or thick saliva, states she's diligent about drinking water constantly throughout the day. Has F/U with her medical oncologist Dr. [name] later this month. Other than her RA flare up, patient reports she's doing well and feels good  Vitals:   11/05/20 1109  BP: 131/72  Pulse: (!) 101  Resp: 20  Temp: (!) 97.5 F (36.4 C)  SpO2: 98%

## 2020-11-07 ENCOUNTER — Encounter: Payer: Self-pay | Admitting: Radiation Oncology

## 2020-11-07 NOTE — Progress Notes (Signed)
Radiation Oncology         [phone] ________________________________  Name: Jean [name] MRN: [id number]  Date: 11/05/2020  DOB: [birth year]  Follow-Up Visit Note  CC: Jean Cloud, MD  Jean Cloud, MD  Diagnosis and Prior Radiotherapy:       ICD-10-CM   1. 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 Reports she can eat a wide variety and denies any issues with thin liquids Smoking or chewing tobacco? None Using fluoride trays daily? Uses flouride toothpaste and sees a dentist regularly; denies any dental concerns Last ENT visit was on: Reports she saw Dr. Newman Pies last week. States he performed a laryngoscopy and stated everything looked good Other notable issues, if any: Denies any ear or jaw pain, or difficulty opening her mouth fully. Denies any signs/symptoms of lymphedema to her chin or neck. Reports minimal dry mouth or thick saliva, states she's diligent about drinking water constantly throughout the day. Has F/U with her medical oncologist Dr. Artis Delay later this month. Other than her RA flare up, patient reports she's doing well and feels good  Vitals:   11/05/20 1109  BP: 131/72  Pulse: (!) 101  Resp: 20  Temp: (!) 97.5 F (36.4 C)  SpO2: 98%         ALLERGIES:  is allergic to sulfa  antibiotics.  Meds: Current Outpatient Medications  Medication Sig Dispense Refill  . Adalimumab 40 MG/0.4ML PNKT Inject 40 mg into the skin every 14 (fourteen) days. 6 each 0  . alendronate (FOSAMAX) 70 MG tablet TAKE 1 TABLET(70 MG) BY MOUTH 1 TIME A WEEK WITH A FULL GLASS OF WATER AND ON AN EMPTY STOMACH 12 tablet 0  . Cholecalciferol (VITAMIN D) 50 MCG (2000 UT) CAPS Take 2,000 Units by mouth daily.     Tery Sanfilippo Calcium (STOOL SOFTENER PO) Take by mouth.    . folic acid (FOLVITE) 1 MG tablet Take 2 mg by mouth daily.    Marland Kitchen leflunomide (ARAVA) 20 MG tablet TAKE 1 TABLET(20 MG) BY MOUTH DAILY 90 tablet 0  . losartan (COZAAR) 100 MG tablet Take 100 mg by mouth daily.   0  . metoprolol succinate (TOPROL-XL) 50 MG 24 hr tablet Take 50 mg by mouth daily.    Marland Kitchen omeprazole (PRILOSEC) 40 MG capsule Take 1 capsule (40 mg total) by mouth daily. 90 capsule 3  . vitamin B-12 (CYANOCOBALAMIN) 1000 MCG tablet Take 1,000 mcg by mouth daily.    . Red Yeast Rice 500 MG/0.5GM POWD Take 600 mg by mouth every other day. (Patient not taking: Reported on 11/05/2020)     No current facility-administered medications for this encounter.    Physical Findings: The patient is in no acute distress. Patient is alert and oriented. Wt Readings from Last 3 Encounters:  11/05/20  111 lb 6.4 oz (50.5 kg)  09/08/20 112 lb 6.4 oz (51 kg)  09/03/20 112 lb 12.8 oz (51.2 kg)    height is 5\' 3"  (1.6 m) and weight is 111 lb 6.4 oz (50.5 kg). Her temperature is 97.5 F (36.4 C) (abnormal). Her blood pressure is 131/72 and her pulse is 101 (abnormal). Her respiration is 20 and oxygen saturation is 98%. .  General: Alert and oriented, in no acute distress with good voice quality HEENT: Head is normocephalic. Extraocular movements are intact. Oropharynx is notable for no lesions, oral cavity is moist with no thrush Neck: Neck is notable for no palpable adenopathy. Skin: Skin in treatment fields shows satisfactory healing   Chest: Clear to auscultation bilaterally Heart regular in rhythm with slightly tachycardic rate Psychiatric: Judgment and insight are intact. Affect is appropriate. Laryngoscopy deferred given recent ENT visit  Lab Findings: Lab Results  Component Value Date   WBC 8.7 09/08/2020   HGB 8.9 (L) 09/08/2020   HCT 29.5 (L) 09/08/2020   MCV 92.8 09/08/2020   PLT 566 (H) 09/08/2020    Lab Results  Component Value Date   TSH 3.126 05/13/2020    Radiographic Findings: No results found.  Impression/Plan:    1) Head and Neck Cancer Status: No evidence of disease  2) Nutritional Status: Continue following recommendations of dietitian.  No concerns raised by patient Wt Readings from Last 3 Encounters:  11/05/20 111 lb 6.4 oz (50.5 kg)  09/08/20 112 lb 6.4 oz (51 kg)  09/03/20 112 lb 12.8 oz (51.2 kg)    3) Risk Factors: The patient has been educated about risk factors including alcohol and tobacco abuse; they understand that avoidance of alcohol and tobacco is important to prevent recurrences as well as other cancers  4) Swallowing: Good function, instructed to continue speech-language pathology exercises  5) Dental: Encouraged to continue regular followup with dentistry, and dental hygiene including fluoride rinses.     6) Thyroid function: Check annually-this will be followed in medical oncology Lab Results  Component Value Date   TSH 3.126 05/13/2020    7) Other: Follow-up in 12 months with me.  Continue following with otolaryngology and medical oncology.  The patient was encouraged to call with any issues or questions before then.  Date of service, in total, I spent 20 minutes on this encounter.  Patient was seen in person.    _____________________________________   Lonie Peak, MD

## 2020-11-14 ENCOUNTER — Other Ambulatory Visit: Payer: Self-pay

## 2020-11-18 ENCOUNTER — Other Ambulatory Visit: Payer: Self-pay

## 2020-11-18 ENCOUNTER — Inpatient Hospital Stay: Payer: Medicare Other

## 2020-11-18 ENCOUNTER — Other Ambulatory Visit: Payer: Self-pay | Admitting: Hematology and Oncology

## 2020-11-18 ENCOUNTER — Encounter: Payer: Self-pay | Admitting: Hematology and Oncology

## 2020-11-18 ENCOUNTER — Inpatient Hospital Stay: Payer: Medicare Other | Attending: Hematology and Oncology | Admitting: Hematology and Oncology

## 2020-11-18 ENCOUNTER — Telehealth: Payer: Self-pay

## 2020-11-18 DIAGNOSIS — Z79899 Other long term (current) drug therapy: Secondary | ICD-10-CM | POA: Insufficient documentation

## 2020-11-18 DIAGNOSIS — C320 Malignant neoplasm of glottis: Secondary | ICD-10-CM | POA: Diagnosis not present

## 2020-11-18 DIAGNOSIS — E039 Hypothyroidism, unspecified: Secondary | ICD-10-CM

## 2020-11-18 DIAGNOSIS — D472 Monoclonal gammopathy: Secondary | ICD-10-CM

## 2020-11-18 DIAGNOSIS — D539 Nutritional anemia, unspecified: Secondary | ICD-10-CM | POA: Insufficient documentation

## 2020-11-18 DIAGNOSIS — D508 Other iron deficiency anemias: Secondary | ICD-10-CM

## 2020-11-18 DIAGNOSIS — K2900 Acute gastritis without bleeding: Secondary | ICD-10-CM

## 2020-11-18 LAB — CBC WITH DIFFERENTIAL/PLATELET
Abs Immature Granulocytes: 0.03 10*3/uL (ref 0.00–0.07)
Basophils Absolute: 0 10*3/uL (ref 0.0–0.1)
Basophils Relative: 0 %
Eosinophils Absolute: 0 10*3/uL (ref 0.0–0.5)
Eosinophils Relative: 0 %
HCT: 28.5 % — ABNORMAL LOW (ref 36.0–46.0)
Hemoglobin: 8.7 g/dL — ABNORMAL LOW (ref 12.0–15.0)
Immature Granulocytes: 0 %
Lymphocytes Relative: 7 %
Lymphs Abs: 0.6 10*3/uL — ABNORMAL LOW (ref 0.7–4.0)
MCH: 28.9 pg (ref 26.0–34.0)
MCHC: 30.5 g/dL (ref 30.0–36.0)
MCV: 94.7 fL (ref 80.0–100.0)
Monocytes Absolute: 0.7 10*3/uL (ref 0.1–1.0)
Monocytes Relative: 8 %
Neutro Abs: 7.4 10*3/uL (ref 1.7–7.7)
Neutrophils Relative %: 85 %
Platelets: 545 10*3/uL — ABNORMAL HIGH (ref 150–400)
RBC: 3.01 MIL/uL — ABNORMAL LOW (ref 3.87–5.11)
RDW: 18.5 % — ABNORMAL HIGH (ref 11.5–15.5)
WBC: 8.8 10*3/uL (ref 4.0–10.5)
nRBC: 0 % (ref 0.0–0.2)

## 2020-11-18 LAB — COMPREHENSIVE METABOLIC PANEL
ALT: 11 U/L (ref 0–44)
AST: 17 U/L (ref 15–41)
Albumin: 2.6 g/dL — ABNORMAL LOW (ref 3.5–5.0)
Alkaline Phosphatase: 108 U/L (ref 38–126)
Anion gap: 11 (ref 5–15)
BUN: 13 mg/dL (ref 8–23)
CO2: 22 mmol/L (ref 22–32)
Calcium: 8.9 mg/dL (ref 8.9–10.3)
Chloride: 107 mmol/L (ref 98–111)
Creatinine, Ser: 0.78 mg/dL (ref 0.44–1.00)
GFR, Estimated: >60 mL/min (ref >60)
Glucose, Bld: 124 mg/dL — ABNORMAL HIGH (ref 70–99)
Potassium: 3.8 mmol/L (ref 3.5–5.1)
Sodium: 140 mmol/L (ref 135–145)
Total Bilirubin: <0.2 mg/dL — ABNORMAL LOW (ref 0.3–1.2)
Total Protein: 6.7 g/dL (ref 6.5–8.1)

## 2020-11-18 LAB — TSH: TSH: 3.065 u[IU]/mL (ref 0.308–3.960)

## 2020-11-18 LAB — IRON AND TIBC
Iron: 15 ug/dL — ABNORMAL LOW (ref 41–142)
Saturation Ratios: 7 % — ABNORMAL LOW (ref 21–57)
TIBC: 211 ug/dL — ABNORMAL LOW (ref 236–444)
UIBC: 196 ug/dL (ref 120–384)

## 2020-11-18 LAB — FERRITIN: Ferritin: 159 ng/mL (ref 11–307)

## 2020-11-18 NOTE — Assessment & Plan Note (Signed)
She has no signs or symptoms of cancer recurrence We discussed the importance of ENT follow-up for surveillance Her TSH is within normal limit

## 2020-11-18 NOTE — Assessment & Plan Note (Signed)
I did not detect any abnormal M protein or abnormal light chain ratio However, on further questioning, she was placed on prednisone recently that could partially treat the problem I plan to recheck MGUS panel again at the end of the year, around November 2022

## 2020-11-18 NOTE — Telephone Encounter (Signed)
-----   Message from [name], MD sent at 11/18/2020 12:41 PM EDT ----- Pls call and let her know iron is low Scheduler will call to schedule IV iron weekly x 3 then see me again in 3 months

## 2020-11-18 NOTE — Assessment & Plan Note (Addendum)
She has persistent iron deficiency anemia and reactive thrombocytosis I plan to increase IV iron and she will receive several more doses The most likely cause of her anemia is due to chronic blood loss/malabsorption syndrome. We discussed some of the risks, benefits, and alternatives of intravenous iron infusions. The patient is symptomatic from anemia and the iron level is critically low. She tolerated oral iron supplement poorly and desires to achieved higher levels of iron faster for adequate hematopoesis. Some of the side-effects to be expected including risks of infusion reactions, phlebitis, headaches, nausea and fatigue.  The patient is willing to proceed. Patient education material was dispensed.  Goal is to keep ferritin level greater than 50 and resolution of anemia  I am concerned that she might have esophagitis given her symptoms and ongoing treatment with Fosamax I will refer her to GI service for evaluation I recommend discontinuation of Fosamax but she would like to discuss this with her rheumatologist first

## 2020-11-18 NOTE — Progress Notes (Signed)
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 We discussed some of the risks, benefits, and alternatives of intravenous iron infusions. The patient is symptomatic from anemia and the iron level is critically low. She tolerated oral iron supplement poorly and desires to achieved higher levels of iron faster for adequate hematopoesis. Some of the side-effects to be expected including risks of infusion reactions, phlebitis, headaches, nausea and fatigue.  The patient is willing to proceed. Patient education material was dispensed.  Goal is to keep ferritin level greater than 50 and resolution of anemia  I am concerned that she might have esophagitis given her symptoms and ongoing treatment with Fosamax I will refer her to GI service for evaluation I recommend discontinuation of Fosamax but she would like to discuss this with her rheumatologist first  MGUS (monoclonal gammopathy of unknown significance) I did not detect any abnormal M protein or abnormal light chain ratio However, on further questioning, she was placed on prednisone recently that could partially treat the problem I plan to recheck MGUS panel again at the end of the  year, around November 2022   Orders Placed This Encounter  Procedures  . Ambulatory referral to Gastroenterology    Referral Priority:   Routine    Referral Type:   Consultation    Referral Reason:   Specialty Services Required    Number of Visits Requested:   1    All questions were answered. The patient knows to call the clinic with any problems, questions or concerns. The total time spent in the appointment was 30 minutes encounter with patients including review of chart and various tests results, discussions about plan of care and coordination of care plan   Jean Lark, MD 11/18/2020 3:22 PM  INTERVAL HISTORY: Please see below for problem oriented charting. She returns for further follow-up on vocal cord cancer, MGUS and severe iron deficiency anemia She had recent radiation oncology evaluation with no signs of cancer recurrence When I prescribed Prilosec from previous visit, she had significant relief from epigastric discomfort The patient denies any recent signs or symptoms of bleeding such as spontaneous epistaxis, hematuria or hematochezia. 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 VOCAL CORD MASS, LEFT, EXCISION:  - Squamous cell carcinoma.  - See comment.    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 Left glottic lesion is hypermetabolic with maximum SUV 8.9, compatible with malignancy. No  discrete adenopathy in the neck. 2. Extensive periarticular activity especially around the large joints, likely related to rheumatoid arthritis. 3. Small but hypermetabolic axillary and left external iliac  lymph nodes are most likely reactive to the patient's rheumatoid arthritis, but merit surveillance. 4. Mild right hydronephrosis without hydroureter, query right UPJ narrowing. Consider follow renal sonography. 5. Prominent but not hypermetabolic left adnexa, possibly due to prominent ovary or left eccentricity of the uterus, correlate with surgical history. This could be further investigated with pelvic sonography if clinically warranted. 6. Other imaging findings of potential clinical significance: Chronic left maxillary sinusitis. Aortic Atherosclerosis (ICD10-I70.0). Coronary atherosclerosis. Emphysema (ICD10-J43.9). Sigmoid colon diverticulosis.   04/10/2019 Imaging   CT neck w/ contrast:  1. Left glottic mass extending to but not beyond the anterior commissure. There is extension into the left paraglottic fat without cartilage erosion. 2. No adenopathy. 3. Aortic Atherosclerosis (ICD10-I70.0) and Emphysema (ICD10-J43.9).   04/11/2019 Cancer Staging   Staging form: Larynx - Glottis, AJCC 8th Edition - Clinical stage from 04/11/2019: Stage III (cT3, cN0, cM0) - Signed by Jean Gibson, MD on 04/11/2019   05/03/2019 - 06/29/2019 Chemotherapy   The patient had cisplatin for chemotherapy treatment.     10/08/2019 PET scan   1. Complete metabolic response to therapy of left-sided glottic primary. 2. Suboptimal evaluation for cervical nodal disease secondary to extensive hypermetabolic "brown" fat within the neck and chest. Given this limitation, no evidence of metastatic disease. 3. Decreased size of hypermetabolic mediastinal node, likely reactive. 4. Aortic atherosclerosis (ICD10-I70.0), coronary artery atherosclerosis and emphysema (ICD10-J43.9). 5. Similar appearance of the right kidney, possibly related to chronic ureteropelvic junction obstruction versus pyelonephritis. 6. Left nephrolithiasis.   11/05/2019 Procedure   Successful right IJ vein Port-A-Cath explantation.   Successful  removal of percutaneous gastrostomy tube     REVIEW OF SYSTEMS:   Constitutional: Denies fevers, chills or abnormal weight loss Eyes: Denies blurriness of vision Ears, nose, mouth, throat, and face: Denies mucositis or sore throat Respiratory: Denies cough, dyspnea or wheezes Cardiovascular: Denies palpitation, chest discomfort or lower extremity swelling Gastrointestinal:  Denies nausea, heartburn or change in bowel habits Skin: Denies abnormal skin rashes Lymphatics: Denies new lymphadenopathy or easy bruising Neurological:Denies numbness, tingling or new weaknesses Behavioral/Psych: Mood is stable, no new changes  All other systems were reviewed with the patient and are negative.  I have reviewed the past medical history, past surgical history, social history and family history with the patient and they are unchanged from previous note.  ALLERGIES:  is allergic to sulfa antibiotics.  MEDICATIONS:  Current Outpatient Medications  Medication Sig Dispense Refill  . Adalimumab 40 MG/0.4ML PNKT Inject 40 mg into the skin every 14 (fourteen) days. 6 each 0  . alendronate (FOSAMAX) 70 MG tablet TAKE 1 TABLET(70 MG) BY MOUTH 1 TIME A WEEK WITH A FULL GLASS OF WATER AND ON AN EMPTY STOMACH 12 tablet 0  . Cholecalciferol (VITAMIN D) 50 MCG (2000 UT) CAPS Take 2,000 Units by mouth daily.     Mariane Baumgarten Calcium (STOOL SOFTENER PO) Take by mouth.    . folic acid (FOLVITE) 1 MG tablet Take 2 mg by mouth daily.    Marland Kitchen leflunomide (ARAVA) 20 MG tablet TAKE 1 TABLET(20 MG) BY MOUTH DAILY 90 tablet 0  . losartan (COZAAR) 100 MG tablet Take 100 mg by mouth daily.   0  . metoprolol succinate (TOPROL-XL) 50 MG 24 hr tablet Take 50 mg by mouth daily.    Marland Kitchen omeprazole (PRILOSEC)  40 MG capsule Take 1 capsule (40 mg total) by mouth daily. 90 capsule 3  . Red Yeast Rice 500 MG/0.5GM POWD Take 600 mg by mouth every other day. (Patient not taking: Reported on 11/05/2020)    . vitamin B-12 (CYANOCOBALAMIN) 1000  MCG tablet Take 1,000 mcg by mouth daily.     No current facility-administered medications for this visit.    PHYSICAL EXAMINATION: ECOG PERFORMANCE STATUS: 1 - Symptomatic but completely ambulatory  Vitals:   11/18/20 1019  BP: 134/68  Pulse: (!) 102  Resp: 18  SpO2: 98%   Filed Weights   11/18/20 1019  Weight: 110 lb 12.8 oz (50.3 kg)    GENERAL:alert, no distress and comfortable SKIN: skin color, texture, turgor are normal, no rashes or significant lesions EYES: normal, Conjunctiva are pink and non-injected, sclera clear OROPHARYNX:no exudate, no erythema and lips, buccal mucosa, and tongue normal  NECK: supple, thyroid normal size, non-tender, without nodularity LYMPH:  no palpable lymphadenopathy in the cervical, axillary or inguinal LUNGS: clear to auscultation and percussion with normal breathing effort HEART: regular rate & rhythm and no murmurs and no lower extremity edema ABDOMEN:abdomen soft, non-tender and normal bowel sounds Musculoskeletal:no cyanosis of digits and no clubbing  NEURO: alert & oriented x 3 with fluent speech, no focal motor/sensory deficits  LABORATORY DATA:  I have reviewed the data as listed    Component Value Date/Time   NA 140 11/18/2020 0955   NA 141 03/05/2014 1050   K 3.8 11/18/2020 0955   K 3.9 03/05/2014 1050   CL 107 11/18/2020 0955   CO2 22 11/18/2020 0955   CO2 24 03/05/2014 1050   GLUCOSE 124 (H) 11/18/2020 0955   GLUCOSE 99 03/05/2014 1050   BUN 13 11/18/2020 0955   BUN 10.6 03/05/2014 1050   CREATININE 0.78 11/18/2020 0955   CREATININE 0.70 09/03/2020 1045   CREATININE 0.8 03/05/2014 1050   CALCIUM 8.9 11/18/2020 0955   CALCIUM 8.8 03/05/2014 1050   PROT 6.7 11/18/2020 0955   PROT 6.5 03/05/2014 1050   ALBUMIN 2.6 (L) 11/18/2020 0955   ALBUMIN 3.2 (L) 03/05/2014 1050   AST 17 11/18/2020 0955   AST 13 (L) 10/10/2019 1402   AST 12 03/05/2014 1050   ALT 11 11/18/2020 0955   ALT 10 10/10/2019 1402   ALT 28  03/05/2014 1050   ALKPHOS 108 11/18/2020 0955   ALKPHOS 85 03/05/2014 1050   BILITOT <0.2 (L) 11/18/2020 0955   BILITOT <0.2 (L) 10/10/2019 1402   BILITOT 0.20 03/05/2014 1050   GFRNONAA >60 11/18/2020 0955   GFRNONAA 87 09/03/2020 1045   GFRAA 100 09/03/2020 1045    No results found for: SPEP, UPEP  Lab Results  Component Value Date   WBC 8.8 11/18/2020   NEUTROABS 7.4 11/18/2020   HGB 8.7 (L) 11/18/2020   HCT 28.5 (L) 11/18/2020   MCV 94.7 11/18/2020   PLT 545 (H) 11/18/2020      Chemistry      Component Value Date/Time   NA 140 11/18/2020 0955   NA 141 03/05/2014 1050   K 3.8 11/18/2020 0955   K 3.9 03/05/2014 1050   CL 107 11/18/2020 0955   CO2 22 11/18/2020 0955   CO2 24 03/05/2014 1050   BUN 13 11/18/2020 0955   BUN 10.6 03/05/2014 1050   CREATININE 0.78 11/18/2020 0955   CREATININE 0.70 09/03/2020 1045   CREATININE 0.8 03/05/2014 1050      Component Value Date/Time   CALCIUM 8.9  11/18/2020 0955   CALCIUM 8.8 03/05/2014 1050   ALKPHOS 108 11/18/2020 0955   ALKPHOS 85 03/05/2014 1050   AST 17 11/18/2020 0955   AST 13 (L) 10/10/2019 1402   AST 12 03/05/2014 1050   ALT 11 11/18/2020 0955   ALT 10 10/10/2019 1402   ALT 28 03/05/2014 1050   BILITOT <0.2 (L) 11/18/2020 0955   BILITOT <0.2 (L) 10/10/2019 1402   BILITOT 0.20 03/05/2014 1050

## 2020-11-18 NOTE — Telephone Encounter (Signed)
Patient notified.  No further needs at this time.

## 2020-11-19 ENCOUNTER — Telehealth: Payer: Self-pay | Admitting: Hematology and Oncology

## 2020-11-19 NOTE — Telephone Encounter (Signed)
Scheduled appointment per 05/31 los. Patient is aware. 

## 2020-11-27 ENCOUNTER — Other Ambulatory Visit: Payer: Self-pay | Admitting: Rheumatology

## 2020-11-27 NOTE — Telephone Encounter (Signed)
Next Visit: 6/17/202   Last Visit: 09/03/2020  Last Fill: 08/28/2020   DX: Rheumatoid arthritis, seronegative, multiple sites    Current Dose per office note 09/03/2020, Arava 20 mg 1 tablet by mouth daily   Labs: 09/08/2020 CBC, RBC 3.18, hemoglobin 8.9, HCT 29.5, RDW 17.4, Platelets 566,  09/03/2020, RBC count, hemoglobin, and hematocrit are very low and are continuing to trend down. Plt count is elevated and trending up.  Absolute lymphocytes are low and trending down.  Okay to refill Arava?

## 2020-12-05 ENCOUNTER — Ambulatory Visit: Payer: Medicare Other | Admitting: Rheumatology

## 2020-12-05 ENCOUNTER — Other Ambulatory Visit: Payer: Self-pay

## 2020-12-05 ENCOUNTER — Inpatient Hospital Stay: Payer: Medicare Other | Attending: Hematology and Oncology

## 2020-12-05 VITALS — BP 133/71 | HR 88 | Temp 98.5°F

## 2020-12-05 DIAGNOSIS — D539 Nutritional anemia, unspecified: Secondary | ICD-10-CM

## 2020-12-05 DIAGNOSIS — D509 Iron deficiency anemia, unspecified: Secondary | ICD-10-CM | POA: Insufficient documentation

## 2020-12-05 DIAGNOSIS — D508 Other iron deficiency anemias: Secondary | ICD-10-CM

## 2020-12-05 DIAGNOSIS — K2900 Acute gastritis without bleeding: Secondary | ICD-10-CM

## 2020-12-05 DIAGNOSIS — Z95828 Presence of other vascular implants and grafts: Secondary | ICD-10-CM

## 2020-12-05 MED ORDER — SODIUM CHLORIDE 0.9 % IV SOLN
Freq: Once | INTRAVENOUS | Status: AC
  Filled 2020-12-05: qty 250

## 2020-12-05 MED ORDER — SODIUM CHLORIDE 0.9 % IV SOLN
300.0000 mg | Freq: Once | INTRAVENOUS | Status: AC
Start: 2020-12-05 — End: 2020-12-05
  Administered 2020-12-05: 300 mg via INTRAVENOUS
  Filled 2020-12-05: qty 300

## 2020-12-05 NOTE — Patient Instructions (Signed)

## 2020-12-12 ENCOUNTER — Inpatient Hospital Stay: Payer: Medicare Other

## 2020-12-12 ENCOUNTER — Other Ambulatory Visit: Payer: Self-pay

## 2020-12-12 VITALS — BP 131/76 | HR 84 | Temp 98.4°F | Resp 18

## 2020-12-12 DIAGNOSIS — D508 Other iron deficiency anemias: Secondary | ICD-10-CM

## 2020-12-12 DIAGNOSIS — K2900 Acute gastritis without bleeding: Secondary | ICD-10-CM

## 2020-12-12 DIAGNOSIS — D539 Nutritional anemia, unspecified: Secondary | ICD-10-CM

## 2020-12-12 DIAGNOSIS — D509 Iron deficiency anemia, unspecified: Secondary | ICD-10-CM | POA: Diagnosis not present

## 2020-12-12 DIAGNOSIS — Z95828 Presence of other vascular implants and grafts: Secondary | ICD-10-CM

## 2020-12-12 MED ORDER — SODIUM CHLORIDE 0.9 % IV SOLN
Freq: Once | INTRAVENOUS | Status: AC
Start: 2020-12-12 — End: 2020-12-12
  Filled 2020-12-12: qty 250

## 2020-12-12 MED ORDER — SODIUM CHLORIDE 0.9 % IV SOLN
300.0000 mg | Freq: Once | INTRAVENOUS | Status: AC
  Administered 2020-12-12: 300 mg via INTRAVENOUS
  Filled 2020-12-12: qty 300

## 2020-12-12 NOTE — Patient Instructions (Signed)

## 2020-12-12 NOTE — Progress Notes (Signed)
Patient declined to stay for full post-observation period. VS retaken after 15 minutes and remained stable. Patient discharged in no acute distress.

## 2020-12-14 ENCOUNTER — Other Ambulatory Visit: Payer: Self-pay | Admitting: Physician Assistant

## 2020-12-15 NOTE — Telephone Encounter (Signed)
Next Visit: 6/17/202   Last Visit: 09/03/2020   Last Fill: 09/25/2020   DX: Rheumatoid arthritis, seronegative, multiple sites    Current Dose per office note 09/03/2020, fosamax 70 mg 1 tablet by mouth once weekly   Labs: 11/18/2020 RBC 3.01, Hgb 8.7, Hct 28.5, RDW 18.5, Platelets 545, Lymphs 0.6, Glucose 124, Labumin 2.6, Total Bilirubin <0.2  Okay to refill Fosamax?

## 2020-12-19 ENCOUNTER — Inpatient Hospital Stay: Payer: Medicare Other | Attending: Hematology and Oncology

## 2020-12-19 ENCOUNTER — Other Ambulatory Visit: Payer: Self-pay

## 2020-12-19 VITALS — BP 136/77 | HR 92 | Temp 98.5°F | Resp 18

## 2020-12-19 DIAGNOSIS — D509 Iron deficiency anemia, unspecified: Secondary | ICD-10-CM | POA: Diagnosis not present

## 2020-12-19 DIAGNOSIS — D539 Nutritional anemia, unspecified: Secondary | ICD-10-CM | POA: Insufficient documentation

## 2020-12-19 DIAGNOSIS — K2900 Acute gastritis without bleeding: Secondary | ICD-10-CM

## 2020-12-19 DIAGNOSIS — Z95828 Presence of other vascular implants and grafts: Secondary | ICD-10-CM

## 2020-12-19 DIAGNOSIS — D508 Other iron deficiency anemias: Secondary | ICD-10-CM

## 2020-12-19 MED ORDER — SODIUM CHLORIDE 0.9 % IV SOLN
300.0000 mg | Freq: Once | INTRAVENOUS | Status: AC
  Administered 2020-12-19: 300 mg via INTRAVENOUS
  Filled 2020-12-19: qty 300

## 2020-12-19 MED ORDER — SODIUM CHLORIDE 0.9 % IV SOLN
Freq: Once | INTRAVENOUS | Status: AC
  Filled 2020-12-19: qty 250

## 2020-12-19 NOTE — Progress Notes (Signed)
Patient declined 30 minute post observation.  VSS.  No s/s or c/o distress or discomfort.

## 2020-12-23 NOTE — Progress Notes (Signed)
Office Visit Note  Patient: Jean [name]             Date of Birth: [birth year]           MRN: [id number]             PCP: [name], MD Referring: [name], MD Visit Date: 01/05/2021 Occupation: @GUAROCC @  Subjective:  Other (Patient reports right elbow changes- occurred in late June 2022.)   History of Present Illness: Jean [name] is a 75 y.o. female with a history of rheumatoid arthritis, osteoarthritis and osteoporosis.  She states she is doing better on the combination of Areva 20 mg p.o. daily and Humira 40 mg every other week.  She states for her anemia she was seen by Dr. Bertis Ruddy and had iron infusions.  She has noticed improvement in her fatigue and her appetite since the iron infusions.  She has also noticed some swelling on her right elbow joint.  She added turmeric supplement which has been helpful.  She stopped taking Fosamax in April because she did not feel like taking it.  Activities of Daily Living:  Patient reports morning stiffness for 1 hour.   Patient Denies nocturnal pain.  Difficulty dressing/grooming: Denies Difficulty climbing stairs: Denies Difficulty getting out of chair: Denies Difficulty using hands for taps, buttons, cutlery, and/or writing: Reports  Review of Systems  Constitutional:  Positive for fatigue.  HENT:  Negative for mouth sores, mouth dryness and nose dryness.   Eyes:  Negative for pain, itching and dryness.  Respiratory:  Negative for shortness of breath and difficulty breathing.   Cardiovascular:  Negative for chest pain and palpitations.  Gastrointestinal:  Negative for blood in stool, constipation and diarrhea.  Endocrine: Negative for increased urination.  Genitourinary:  Negative for difficulty urinating.  Musculoskeletal:  Positive for joint pain, joint pain, joint swelling, myalgias, morning stiffness, muscle tenderness and myalgias.  Skin:  Negative for color change, rash and redness.   Allergic/Immunologic: Negative for susceptible to infections.  Neurological:  Positive for numbness. Negative for dizziness, headaches, memory loss and weakness.  Hematological:  Negative for bruising/bleeding tendency.  Psychiatric/Behavioral:  Negative for confusion.    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   Vitamin D deficiency 01/23/2017   Rheumatoid arthritis, seronegative, multiple sites (HCC) 01/19/2017   High risk medication use 01/19/2017   Primary osteoarthritis of both hands 01/19/2017   Primary osteoarthritis of both knees 01/19/2017   Fatigue 01/19/2017   Thrombocytosis 01/19/2017   Primary osteoarthritis of both feet 01/19/2017   Deficiency anemia 01/19/2017   Screening-pulmonary TB 04/26/2016    Past Medical History:  Diagnosis Date   Arthritis    Cancer (HCC)    History of radiation therapy 05/03/19- 06/25/19   Larynx 35 fractions of 2 Gy each to total 70 Gy   Hypertension    Rheumatoid arthritis (HCC)    Vocal cord mass     Family History  Problem Relation Age of Onset   Stroke Mother    Hypertension Daughter    Past Surgical History:  Procedure Laterality Date   ECTOPIC PREGNANCY  SURGERY     EXTERNAL EAR SURGERY Right    removal of cyst or gland   IR GASTROSTOMY TUBE MOD SED  04/26/2019   IR GASTROSTOMY TUBE REMOVAL  11/05/2019   IR IMAGING GUIDED PORT INSERTION  04/26/2019   IR REMOVAL TUN ACCESS W/ PORT W/O FL MOD SED  11/05/2019   MICROLARYNGOSCOPY Left 03/26/2019   Procedure: DIRECT MICROLARYNGOSCOPY WITH BIOPSY OF VOCAL CORD MASS;  Surgeon: Newman Pies, MD;  Location: [facility];  Service: ENT;  Laterality: Left;   Social History   Social History Narrative   Patient was recently widowed in March 2020.   Patient has 1 daughter who lives in [place]   Patient moved to [place] from [place] in January 2009.   Patient is a retired [employer] fund[employer].   Immunization History  Administered Date(s) Administered   Fluad Quad(high Dose 65+) 05/20/2020   Influenza, High Dose Seasonal PF 06/09/2017   PFIZER(Purple Top)SARS-COV-2 Vaccination 08/29/2019, 09/19/2019, 07/11/2020     Objective: Vital Signs: BP 130/72 (BP Location: Right Arm, Patient Position: Sitting, Cuff Size: Normal)   Pulse 93   Ht 5\' 3"  (1.6 m)   Wt 109 lb 12.8 oz (49.8 kg)   BMI 19.45 kg/m    Physical Exam Vitals and nursing note reviewed.  Constitutional:      Appearance: She is well-developed.  HENT:     Head: Normocephalic and atraumatic.  Eyes:     Conjunctiva/sclera: Conjunctivae normal.  Cardiovascular:     Rate and Rhythm: Normal rate and regular rhythm.     Heart sounds: Normal heart sounds.  Pulmonary:     Effort: Pulmonary effort is normal.     Breath sounds: Normal breath sounds.  Abdominal:     General: Bowel sounds are normal.     Palpations: Abdomen is soft.  Musculoskeletal:     Cervical back: Normal range of motion.  Lymphadenopathy:     Cervical: No cervical adenopathy.  Skin:    General: Skin is warm and dry.     Capillary Refill: Capillary refill takes less than 2 seconds.  Neurological:     Mental Status: She is alert and oriented to person, place, and time.  Psychiatric:        Behavior: Behavior normal.     Musculoskeletal Exam: C-spine was in good range of motion.  Shoulder joints were in good range of motion.  She has some discomfort range of motion of her left shoulder joint.  She had right olecranon bursitis.  She has synovitis of bilateral wrist joints, MCPs and PIPs as described below.  Hip joints and  knee joints in good range of motion.  She has warmth on palpation of her left knee.  There was no tenderness over ankles or MTPs.  CDAI Exam: CDAI Score: 31.1  Patient Global: 4 mm; Provider Global: 7 mm Swollen: 15 ; Tender: 15  Joint Exam 01/05/2021      Right  Left  Elbow  Swollen Tender     Wrist  Swollen Tender  Swollen Tender  MCP 2  Swollen Tender  Swollen Tender  MCP 3  Swollen Tender  Swollen Tender  MCP 4  Swollen Tender  Swollen Tender  PIP 2  Swollen Tender  Swollen Tender  PIP 3  Swollen Tender  Swollen Tender  PIP 4  Swollen Tender     Knee     Swollen Tender     Investigation: No additional findings.  Imaging: No  results found.  Recent Labs: Lab Results  Component Value Date   WBC 5.8 01/02/2021   HGB 9.6 (L) 01/02/2021   PLT 451 (H) 01/02/2021   NA 139 01/02/2021   K 4.1 01/02/2021   CL 108 01/02/2021   CO2 25 01/02/2021   GLUCOSE 101 (H) 01/02/2021   BUN 10 01/02/2021   CREATININE 0.65 01/02/2021   BILITOT 0.3 01/02/2021   ALKPHOS 108 11/18/2020   AST 13 01/02/2021   ALT 12 01/02/2021   PROT 6.0 (L) 01/02/2021   ALBUMIN 2.6 (L) 11/18/2020   CALCIUM 9.0 01/02/2021   GFRAA 100 09/03/2020   QFTBGOLDPLUS NEGATIVE 08/06/2020    Speciality Comments: PPD- 07/08/17 Negative, Humira started August 06, 2020-inadequate response  Procedures:  No procedures performed Allergies: Sulfa antibiotics   Assessment / Plan:     Visit Diagnoses: Rheumatoid arthritis, seronegative, multiple sites (HCC) - (D/c MTX 8 tab/wk-elevated creatinine-1.07 on 05/09/20, GFR 60): Patient states that she is feeling much better since she has been on Humira and Arava combination.  Although she had synovitis in multiple joints today.  She denies missing any doses of Arava or Humira.  She is also developed right olecranon bursitis.  I will give her a prednisone taper starting at 20 mg and taper by 5 mg every 4 days.  I also discussed different treatment options and their side  effects. I will try Enbrel 50 mg subcu weekly if approved by the insurance.  A handout was given and consent was taken.  We will apply for Enbrel.  She will switch from Humira to Enbrel.  We will check labs in a month and then every 3 months to monitor for drug toxicity.  Medication counseling:    Counseled patient that Enbrel is a TNF blocking agent.  Reviewed Enbrel dose of 50 mg once weekly.  Counseled patient on purpose, proper use, and adverse effects of Enbrel.  Reviewed the most common adverse effects including infections, headache, and injection site reactions. Discussed that there is the possibility of an increased risk of malignancy but it is not well understood if this increased risk is due to the medication or the disease state.  Advised patient to get yearly dermatology exams due to risk of skin cancer.  Reviewed the importance of regular labs while on Enbrel therapy.  Advised patient to get standing labs one month after starting Enbrel then every 2 months.  Provided patient with standing lab orders.  Counseled patient that Enbrel should be held prior to scheduled surgery.  Counseled patient to avoid live vaccines while on Enbrel.  Advised patient to get annual influenza vaccine and the pneumococcal vaccine as needed.  Provided patient with medication education material and answered all questions.  Patient voiced understanding.  Patient consented to Enbrel.  Will upload consent into the media tab.  Reviewed storage instructions for Enbrel.  Advised initial injection must be administered in office.  Patient voiced understanding.     High risk medication use - Arava 20 mg 1 tablet by mouth daily and Humira 40mg  subcutaneous every 14 days (re-started on 08/06/20).  Last labs on January 02, 2021 showed hemoglobin of 9.6 otherwise unremarkable.  TB Gold on August 06, 2020 was negative.  She has been advised to stop her medications in case she develops an infection and resume medications once infection  resolves.  She was also given information regarding fourth dose of COVID-19 booster.  Updated information on the vaccination recommendations also placed in the AVS.  She  was advised to get annual skin examination to screen for nonmelanoma skin cancer while she is on anti-TNF's.  Chronic left shoulder pain-left shoulder pain has improved.  Primary osteoarthritis of both hands-she has some stiffness in her hands due to osteoarthritis although she has severe synovitis in her hands currently due to rheumatoid arthritis.  Primary osteoarthritis of both knees-her right knee joint is better but left knee joint is inflamed .  Primary osteoarthritis of both feet-patient had no synovitis on examination.  She has intermittent discomfort.  Spondylosis without myelopathy or radiculopathy, cervical region-she had good range of motion without discomfort.  Age-related osteoporosis without current pathological fracture - Nov 02, 2019 DEXA showed T score of -3.4 in the lumbar region. fosamax 70 mg 1 tablet by mouth once weekly.  She is stopped Fosamax in April 2022 as she was tired of taking medications.  I encouraged her to resume Fosamax.  I discussed the significance of treatment of osteoporosis  History of vitamin D deficiency-use of vitamin D was discussed.  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  Thrombocytosis-reactive due to anemia.  History of anemia-patient reports recent iron infusions.  Abnormal SPEP - followed by oncology.  Orders: No orders of the defined types were placed in this encounter.  Meds ordered this encounter  Medications   predniSONE (DELTASONE) 5 MG tablet    Sig: Take 4 tabs po qd x 4 days, 3  tabs po qd x 4 days, 2  tabs po qd x 4 days, 1  tab po qd x 4 days    Dispense:  40 tablet    Refill:  0      Follow-Up Instructions: Return in about 5 months (around 06/07/2021) for Rheumatoid arthritis,  Osteoarthritis.   Pollyann Savoy, MD  Note - This record has been created using Animal nutritionist.  Chart creation errors have been sought, but may not always  have been located. Such creation errors do not reflect on  the standard of medical care.

## 2021-01-02 ENCOUNTER — Other Ambulatory Visit: Payer: Self-pay

## 2021-01-02 DIAGNOSIS — Z79899 Other long term (current) drug therapy: Secondary | ICD-10-CM

## 2021-01-03 LAB — COMPLETE METABOLIC PANEL WITH GFR
AG Ratio: 1.4 (calc) (ref 1.0–2.5)
ALT: 12 U/L (ref 6–29)
AST: 13 U/L (ref 10–35)
Albumin: 3.5 g/dL — ABNORMAL LOW (ref 3.6–5.1)
Alkaline phosphatase (APISO): 78 U/L (ref 37–153)
BUN: 10 mg/dL (ref 7–25)
CO2: 25 mmol/L (ref 20–32)
Calcium: 9 mg/dL (ref 8.6–10.4)
Chloride: 108 mmol/L (ref 98–110)
Creat: 0.65 mg/dL (ref 0.60–1.00)
Globulin: 2.5 g/dL (calc) (ref 1.9–3.7)
Glucose, Bld: 101 mg/dL — ABNORMAL HIGH (ref 65–99)
Potassium: 4.1 mmol/L (ref 3.5–5.3)
Sodium: 139 mmol/L (ref 135–146)
Total Bilirubin: 0.3 mg/dL (ref 0.2–1.2)
Total Protein: 6 g/dL — ABNORMAL LOW (ref 6.1–8.1)
eGFR: 93 mL/min/{1.73_m2} (ref >=60)

## 2021-01-03 LAB — CBC WITH DIFFERENTIAL/PLATELET
Absolute Monocytes: 551 cells/uL (ref 200–950)
Basophils Absolute: 29 cells/uL (ref 0–200)
Basophils Relative: 0.5 %
Eosinophils Absolute: 41 cells/uL (ref 15–500)
Eosinophils Relative: 0.7 %
HCT: 30.3 % — ABNORMAL LOW (ref 35.0–45.0)
Hemoglobin: 9.6 g/dL — ABNORMAL LOW (ref 11.7–15.5)
Lymphs Abs: 650 cells/uL — ABNORMAL LOW (ref 850–3900)
MCH: 30.4 pg (ref 27.0–33.0)
MCHC: 31.7 g/dL — ABNORMAL LOW (ref 32.0–36.0)
MCV: 95.9 fL (ref 80.0–100.0)
MPV: 8.9 fL (ref 7.5–12.5)
Monocytes Relative: 9.5 %
Neutro Abs: 4530 cells/uL (ref 1500–7800)
Neutrophils Relative %: 78.1 %
Platelets: 451 10*3/uL — ABNORMAL HIGH (ref 140–400)
RBC: 3.16 10*6/uL — ABNORMAL LOW (ref 3.80–5.10)
RDW: 16.5 % — ABNORMAL HIGH (ref 11.0–15.0)
Total Lymphocyte: 11.2 %
WBC: 5.8 10*3/uL (ref 3.8–10.8)

## 2021-01-04 NOTE — Progress Notes (Signed)
Total protein is low due to autoimmune disease., Lymphocyte count is due to immunosuppressive therapy.  Anemia persists but hemoglobin has improved.

## 2021-01-05 ENCOUNTER — Ambulatory Visit (INDEPENDENT_AMBULATORY_CARE_PROVIDER_SITE_OTHER): Payer: Medicare Other | Admitting: Rheumatology

## 2021-01-05 ENCOUNTER — Other Ambulatory Visit: Payer: Self-pay

## 2021-01-05 ENCOUNTER — Encounter: Payer: Self-pay | Admitting: Rheumatology

## 2021-01-05 ENCOUNTER — Telehealth: Payer: Self-pay | Admitting: Pharmacist

## 2021-01-05 VITALS — BP 130/72 | HR 93 | Ht 63.0 in | Wt 109.8 lb

## 2021-01-05 DIAGNOSIS — Z79899 Other long term (current) drug therapy: Secondary | ICD-10-CM

## 2021-01-05 DIAGNOSIS — M25512 Pain in left shoulder: Secondary | ICD-10-CM

## 2021-01-05 DIAGNOSIS — M19042 Primary osteoarthritis, left hand: Secondary | ICD-10-CM

## 2021-01-05 DIAGNOSIS — M19041 Primary osteoarthritis, right hand: Secondary | ICD-10-CM | POA: Diagnosis not present

## 2021-01-05 DIAGNOSIS — C320 Malignant neoplasm of glottis: Secondary | ICD-10-CM

## 2021-01-05 DIAGNOSIS — Z8639 Personal history of other endocrine, nutritional and metabolic disease: Secondary | ICD-10-CM

## 2021-01-05 DIAGNOSIS — M19071 Primary osteoarthritis, right ankle and foot: Secondary | ICD-10-CM

## 2021-01-05 DIAGNOSIS — M47812 Spondylosis without myelopathy or radiculopathy, cervical region: Secondary | ICD-10-CM

## 2021-01-05 DIAGNOSIS — M810 Age-related osteoporosis without current pathological fracture: Secondary | ICD-10-CM

## 2021-01-05 DIAGNOSIS — D75839 Thrombocytosis, unspecified: Secondary | ICD-10-CM

## 2021-01-05 DIAGNOSIS — Z780 Asymptomatic menopausal state: Secondary | ICD-10-CM

## 2021-01-05 DIAGNOSIS — M0609 Rheumatoid arthritis without rheumatoid factor, multiple sites: Secondary | ICD-10-CM

## 2021-01-05 DIAGNOSIS — Z862 Personal history of diseases of the blood and blood-forming organs and certain disorders involving the immune mechanism: Secondary | ICD-10-CM

## 2021-01-05 DIAGNOSIS — R778 Other specified abnormalities of plasma proteins: Secondary | ICD-10-CM

## 2021-01-05 DIAGNOSIS — G8929 Other chronic pain: Secondary | ICD-10-CM

## 2021-01-05 DIAGNOSIS — M19072 Primary osteoarthritis, left ankle and foot: Secondary | ICD-10-CM

## 2021-01-05 DIAGNOSIS — M170 Bilateral primary osteoarthritis of knee: Secondary | ICD-10-CM

## 2021-01-05 MED ORDER — PREDNISONE 5 MG PO TABS: ORAL_TABLET | ORAL | 0 refills | Status: DC

## 2021-01-05 NOTE — Telephone Encounter (Signed)
Submitted a Prior Authorization request to [facility] for ENBREL via CoverMyMeds. Will update once we receive a response.   Key: [id number]

## 2021-01-05 NOTE — Telephone Encounter (Signed)
Please start Enbrel Mini BIV.  Dose: 50mg  every 7 days  Dx: M06.09 (RA)  Previously tried therapies: Humira + leflunomide  Patient assistance application completed at [facility] with Dr. Estanislado Pandy today 01/05/21. Pending provider signature  Knox Saliva, PharmD, MPH Clinical Pharmacist (Rheumatology and Pulmonology)

## 2021-01-05 NOTE — Patient Instructions (Addendum)
Standing Labs We placed an order today for your standing lab work.   Please have your standing labs drawn in 1 month after starting Enbrel and then every 3 months  If possible, please have your labs drawn 2 weeks prior to your appointment so that the provider can discuss your results at your appointment.  Please note that you may see your imaging and lab results in [facility] before we have reviewed them. We may be awaiting multiple results to interpret others before contacting you. Please allow our office up to 72 hours to thoroughly review all of the results before contacting the office for clarification of your results.  We have open lab daily: Monday through Thursday from 1:30-4:30 PM and Friday from 1:30-4:00 PM at the office of Dr. Bo Merino, [facility].   Please be advised, all patients with office appointments requiring lab work will take precedent over walk-in lab work.  If possible, please come for your lab work on Monday and Friday afternoons, as you may experience shorter wait times. The office is located at [address], [place], [place], [place] [address] No appointment is necessary.   Labs are drawn by [facility]. Please bring your co-pay at the time of your lab draw.  You may receive a bill from [facility] for your lab work.  If you wish to have your labs drawn at another location, please call the office 24 hours in advance to send orders.  If you have any questions regarding directions or hours of operation,  please call [phone].   As a reminder, please drink plenty of water prior to coming for your lab work. Thanks!   Vaccines You are taking a medication(s) that can suppress your immune system.  The following immunizations are recommended: Flu annually Covid-19  Td/Tdap (tetanus, diphtheria, pertussis) every 10 years Pneumonia (Prevnar 15 then Pneumovax 23 at least 1 year apart.  Alternatively, can take Prevnar 20 without needing additional  dose) Shingrix (after age 33): 2 doses from 4 weeks to 6 months apart  Please check with your PCP to make sure you are up to date.   Please get annual skin examination by t dermatologist to screen for nonmelanoma skin cancer while you are on Humira  If you test POSITIVE for COVID19 and have MILD to MODERATE symptoms: First, call your PCP if you would like to receive COVID19 treatment AND Hold your medications during the infection and for at least 1 week after your symptoms have resolved: Injectable medication (Benlysta, Cimzia, Cosentyx, Enbrel, Humira, Orencia, Remicade, Simponi, Stelara, Taltz, Tremfya) Methotrexate Leflunomide (Arava) Mycophenolate (Cellcept) Morrie Sheldon, Olumiant, or Rinvoq If you take Actemra or Kevzara, you DO NOT need to hold these for COVID19 infection.  If you test POSITIVE for COVID19 and have NO symptoms: First, call your PCP if you would like to receive COVID19 treatment AND Hold your medications for at least 10 days after the day that you tested positive Injectable medication (Benlysta, Cimzia, Cosentyx, Enbrel, Humira, Orencia, Remicade, Simponi, Stelara, Taltz, Tremfya) Methotrexate Leflunomide (Arava) Mycophenolate (Cellcept) Morrie Sheldon, Olumiant, or Rinvoq If you take Actemra or Kevzara, you DO NOT need to hold these for COVID19 infection.  If you have signs or symptoms of an infection or start antibiotics: First, call your PCP for workup of your infection. Hold your medication through the infection, until you complete your antibiotics, and until symptoms resolve if you take the following: Injectable medication (Actemra, Benlysta, Cimzia, Cosentyx, Enbrel, Humira, Kevzara, Orencia, Remicade, Simponi, Stelara, [place], [place]) Methotrexate  Leflunomide (Arava) Mycophenolate (Cellcept) Roma Kayser, or Rinvoq  Heart Disease Prevention   Your inflammatory disease increases your risk of heart disease which includes heart attack, stroke, atrial  fibrillation (irregular heartbeats), high blood pressure, heart failure and atherosclerosis (plaque in the arteries).  It is important to reduce your risk by:   Keep blood pressure, cholesterol, and blood sugar at healthy levels   Smoking Cessation   Maintain a healthy weight  BMI 20-25   Eat a healthy diet  Plenty of fresh fruit, vegetables, and whole grains  Limit saturated fats, foods high in sodium, and added sugars  DASH and Mediterranean diet   Increase physical activity  Recommend moderate physically activity for 150 minutes per week/ 30 minutes a day for five days a week These can be broken up into three separate ten-minute sessions during the day.   Reduce Stress  Meditation, slow breathing exercises, yoga, coloring books  Dental visits twice a year   Etanercept Injection What is this medication? ETANERCEPT (et a [employer]) is used for the treatment of rheumatoid arthritis. The medicine is also used to treat psoriatic arthritis, ankylosing spondylitis,and psoriasis. This medicine may be used for other purposes; ask your health care provider orpharmacist if you have questions. COMMON BRAND NAME(S): Enbrel What should I tell my care team before I take this medication? They need to know if you have any of these conditions: bleeding disorder cancer diabetes granulomatosis with polyangiitis heart failure HIV or AIDs immune system problems infection such as tuberculosis (TB) or other bacterial, fungal or viral infections liver disease nervous system problems such as Guillain-Barre syndrome, multiple sclerosis or seizures recent or upcoming vaccine an unusual or allergic reaction to etanercept, other medicines, latex, rubber, food, dyes, or preservatives pregnant or trying to get pregnant breast-feeding How should I use this medication? The medicine is injected under the skin. You will be taught how to prepare and give it. Take it as directed on the prescription label. Keep  taking it unlessyour health care provider tells you stop. This medicine comes with INSTRUCTIONS FOR USE. Ask your pharmacist for directions on how to use this medicine. Read the information carefully. Talk toyour pharmacist or health care provider if you have questions. If you use a pen, be sure to take off the outer needle cover before using thedose. It is important that you put your used needles and syringes in a special sharps container. Do not put them in a trash can. If you do not have a sharpscontainer, call your pharmacist or health care provider to get one. A special MedGuide will be given to you by the pharmacist with eachprescription and refill. Be sure to read this information carefully each time. Talk to your health care provider about the use of this medicine in children. While it may be prescribed for children as young as 71 years of age for selectedconditions, precautions do apply. Overdosage: If you think you have taken too much of this medicine contact apoison control center or emergency room at once. NOTE: This medicine is only for you. Do not share this medicine with others. What if I miss a dose? If you miss a dose, take it as soon as you can. If it is almost time for yournext dose, take only that dose. Do not take double or extra doses. What may interact with this medication? Do not take this medicine with any of the following medications: biologic medicines such as adalimumab, certolizumab, golimumab, infliximab live vaccines rilonacept This medicine  may also interact with the following medications: abatacept anakinra biologic medicines such as anifrolumab, baricitinib, belimumab, canakinumab, natalizumab, rituximab, sarilumab, tocilizumab, tofacitinib, upadacitinib, vedolizumab cyclophosphamide sulfasalazine This list may not describe all possible interactions. Give your health care provider a list of all the medicines, herbs, non-prescription drugs, or dietary supplements  you use. Also tell them if you smoke, drink alcohol, or use illegaldrugs. Some items may interact with your medicine. What should I watch for while using this medication? Visit your health care provider for regular checks on your progress. Tell your health care provider if your symptoms do not start to get better or if they getworse. This medicine may increase your risk of getting an infection. Call your health care provider for advice if you get a fever, chills, sore throat, or other symptoms of a cold or flu. Do not treat yourself. Try to avoid being around people who are sick. If you have not had the measles or chickenpox vaccines, tell your health care provider right away if you are around someone with theseviruses. You will be tested for tuberculosis (TB) before you start this medicine. If your doctor prescribes any medicine for TB, you should start taking the TB medicine before starting this medicine. Make sure to finish the full course ofTB medicine. Avoid taking medicines that contain aspirin, acetaminophen, ibuprofen, naproxen, or ketoprofen unless instructed by your health care provider. Thesemedicines may hide fever. Talk to your health care provider about your risk of cancer. You may be more atrisk for certain types of cancer if you take this medicine. This medicine can decrease the response to a vaccine. If you need to get vaccinated, tell your health care provider if you have received this medicine. Extra booster doses may be needed. Talk to your health care provider to see ifa different vaccination schedule is needed. What side effects may I notice from receiving this medication? Side effects that you should report to your health care provider as soon aspossible: allergic reactions (skin rash, itching or hives; swelling of the face, lips, or tongue) changes in vision dizziness heart failure (trouble breathing; fast, irregular heartbeat; sudden weight gain; swelling of the ankles, feet,  hands; unusually weak or tired) infection (fever, chills, cough, sore throat, pain or trouble passing urine) light-colored stools liver injury (dark yellow or brown urine; general ill feeling or flu-like symptoms; loss of appetite, right upper belly pain; unusually weak or tired, yellowing of the eyes or skin) low red blood cell counts (trouble breathing; feeling faint; lightheaded, falls; unusually weak or tired) pain, tingling, numbness in the hands or feet red, scaly patches or raised bumps on the skin seizures unusual bruising or bleeding weakness in the arms or legs Side effects that usually do not require medical attention (report to yourhealth care provider if they continue or are bothersome): headache nausea pain, redness, or irritation at site where injected vomiting This list may not describe all possible side effects. Call your doctor for medical advice about side effects. You may report side effects to FDA at1-800-FDA-1088. Where should I keep my medication? Keep out of the reach of children and pets. See product for storage information. Each product may have differentinstructions. Get rid of any unused medicine after the expiration date. To get rid of medicines that are no longer needed or have expired: Take the medicine to a medicine take-back program. Check with your pharmacy or law enforcement to find a location. If you cannot return the medicine, ask your pharmacist or health care provider  how to get rid of this medicine safely. NOTE: This sheet is a summary. It may not cover all possible information. If you have questions about this medicine, talk to your doctor, pharmacist, orhealth care provider.  2022 Elsevier/Gold Standard (2020-03-21 12:59:29)

## 2021-01-06 ENCOUNTER — Other Ambulatory Visit (HOSPITAL_COMMUNITY): Payer: Self-pay

## 2021-01-06 ENCOUNTER — Encounter: Payer: Self-pay | Admitting: Hematology and Oncology

## 2021-01-06 NOTE — Progress Notes (Signed)
Pharmacy Note  Subjective: Patient presents today to the [facility] for follow up office visit.  Patient seen by the pharmacist for counseling on Enbrel for rheumatoid arthritis. She will be switching from Humira.  Diagnosis of heart failure: No  Objective:  CBC    Component Value Date/Time   WBC 5.8 01/02/2021 0908   RBC 3.16 (L) 01/02/2021 0908   HGB 9.6 (L) 01/02/2021 0908   HGB 10.6 (L) 10/10/2019 1402   HGB 11.5 (L) 07/31/2014 0923   HCT 30.3 (L) 01/02/2021 0908   HCT 36.6 07/31/2014 0923   PLT 451 (H) 01/02/2021 0908   PLT 268 10/10/2019 1402   PLT 478 (H) 07/31/2014 0923   MCV 95.9 01/02/2021 0908   MCV 96.4 07/31/2014 0923   MCH 30.4 01/02/2021 0908   MCHC 31.7 (L) 01/02/2021 0908   RDW 16.5 (H) 01/02/2021 0908   RDW 17.2 (H) 07/31/2014 0923   LYMPHSABS 650 (L) 01/02/2021 0908   LYMPHSABS 1.3 07/31/2014 0923   MONOABS 0.7 11/18/2020 0955   MONOABS 0.6 07/31/2014 0923   EOSABS 41 01/02/2021 0908   EOSABS 0.0 07/31/2014 0923   BASOSABS 29 01/02/2021 0908   BASOSABS 0.1 07/31/2014 0923     CMP     Component Value Date/Time   NA 139 01/02/2021 0908   NA 141 03/05/2014 1050   K 4.1 01/02/2021 0908   K 3.9 03/05/2014 1050   CL 108 01/02/2021 0908   CO2 25 01/02/2021 0908   CO2 24 03/05/2014 1050   GLUCOSE 101 (H) 01/02/2021 0908   GLUCOSE 99 03/05/2014 1050   BUN 10 01/02/2021 0908   BUN 10.6 03/05/2014 1050   CREATININE 0.65 01/02/2021 0908   CREATININE 0.8 03/05/2014 1050   CALCIUM 9.0 01/02/2021 0908   CALCIUM 8.8 03/05/2014 1050   PROT 6.0 (L) 01/02/2021 0908   PROT 6.5 03/05/2014 1050   ALBUMIN 2.6 (L) 11/18/2020 0955   ALBUMIN 3.2 (L) 03/05/2014 1050   AST 13 01/02/2021 0908   AST 13 (L) 10/10/2019 1402   AST 12 03/05/2014 1050   ALT 12 01/02/2021 0908   ALT 10 10/10/2019 1402   ALT 28 03/05/2014 1050   ALKPHOS 108 11/18/2020 0955   ALKPHOS 85 03/05/2014 1050   BILITOT 0.3 01/02/2021 0908   BILITOT <0.2 (L) 10/10/2019 1402    BILITOT 0.20 03/05/2014 1050   GFRNONAA >60 11/18/2020 0955   GFRNONAA 87 09/03/2020 1045   GFRAA 100 09/03/2020 1045     Baseline Immunosuppressant Therapy Labs TB GOLD Quantiferon TB Gold Latest Ref Rng & Units 08/06/2020  Quantiferon TB Gold Plus NEGATIVE NEGATIVE   Hepatitis Panel Hepatitis Latest Ref Rng & Units 08/06/2020  Hep B Surface Ag NON-REACTI NON-REACTIVE  Hep B IgM NON-REACTI NON-REACTIVE  Hep C Ab NON-REACTI NON-REACTIVE  Hep C Ab NON-REACTI NON-REACTIVE   HIV No results found for: HIV Immunoglobulins Immunoglobulin Electrophoresis Latest Ref Rng & Units 05/13/2020  IgG 586 - 1,602 mg/dL 762  IgM 26 - 217 mg/dL 45   SPEP Serum Protein Electrophoresis Latest Ref Rng & Units 01/02/2021  Total Protein 6.1 - 8.1 g/dL 6.0(L)  Albumin 3.8 - 4.8 g/dL -  Alpha-1 0.2 - 0.3 g/dL -  Alpha-2 0.5 - 0.9 g/dL -  Beta Globulin 0.4 - 0.6 g/dL -  Beta 2 0.2 - 0.5 g/dL -  Gamma Globulin 0.8 - 1.7 g/dL -    Chest x-ray: 11/30/13 - wnl  Assessment/Plan:  Counseled patient that Enbrel is a TNF  blocking agent.  Counseled patient on purpose, proper use, and adverse effects of Enbrel.  Reviewed the most common adverse effects including infections, headache, and injection site reactions.  Discussed that there is the possibility of an increased risk of malignancy including non-melanoma skin cancer but it is not well understood if this increased risk is due to the medication or the disease state.  Advised patient to get yearly dermatology exams due to risk of skin cancer.  Counseled patient that Enbrel should be held prior to scheduled surgery.  Counseled patient to avoid live vaccines while on Enbrel.  Recommend annual influenza, PCV 15 or PCV20 or Pneumovax 23, and Shingrix as indicated.  Reviewed the importance of regular labs while on Enbrel therapy.  Will monitor CBC and CMP 1 month after starting and then every 3 months routinely thereafter. Will monitor TB gold annually. Standing  orders placed. Provided patient with medication education material and answered all questions.  Patient consented to Enbrel.  Will upload consent into the media tab.  Reviewed storage instructions for Enbrel.  Advised initial injection must be administered in office.    Dermatology referral already placed and she has seen dermatologist in the past couple of months (Unable to recall exactly when) - states derm exam did not show anything concerning.  She plans to hold her Humira dose that is due on 01/07/21.  Knox Saliva, PharmD, MPH Clinical Pharmacist (Rheumatology and Pulmonology)

## 2021-01-06 NOTE — Telephone Encounter (Signed)
Received notification from [facility] regarding a prior authorization for ENBREL. Authorization has been APPROVED from 01/05/21 to 06/20/21. Copay for 28 day supply is $2058.  Authorization # [id number] Phone # [place] app completed by patient and Dr. Estanislado Pandy yesterday. Submitted Patient Assistance Application to [employer] for ENBREL along with provider portion, PA, patient portion, insurance card copy, and med list.  Will update patient when we receive a response. Copy of application sent to scan center  Fax# [phone] Phone# [phone]  Knox Saliva, PharmD, MPH Clinical Pharmacist (Rheumatology and Pulmonology)

## 2021-01-09 NOTE — Telephone Encounter (Signed)
Received a inquiry for additional information from the [employer]. Faxed in copies of both insurance cards as requested.  Phone# [phone] Fax# [phone]

## 2021-01-12 ENCOUNTER — Telehealth: Payer: Self-pay

## 2021-01-12 ENCOUNTER — Other Ambulatory Visit (HOSPITAL_COMMUNITY): Payer: Self-pay

## 2021-01-12 NOTE — Telephone Encounter (Signed)
Claudette from [employer] left a voicemail to provide an update on patient's application for assistance with her medication.   Phone [phone] Monday  Friday 8:00 am to 8:00 pm ET

## 2021-01-12 NOTE — Telephone Encounter (Signed)
Received a fax from  [place] regarding an approval for ENBREL patient assistance from 01/12/21 to 06/20/21.   Patient scheduled for Enbrel new start visit on 01/15/21. She held her Humira dose from 01/07/21 and is able to start Enbrel. She has been advised to schedule shipment of Enbrel if [employer] reaches out to her.  Phone number: [phone]  Knox Saliva, PharmD, MPH, BCPS Clinical Pharmacist (Rheumatology and Pulmonology)

## 2021-01-12 NOTE — Telephone Encounter (Signed)
Patient approved for Enbrel through [employer] PAP through 06/20/21. See previous Enbrel BIV encounter.

## 2021-01-12 NOTE — Telephone Encounter (Signed)
Received fax requesting Enbrel rx page be sent to [employer]. Refaxed entire [employer] application including confirmation page of original sent fax  Knox Saliva, PharmD, MPH, BCPS Clinical Pharmacist (Rheumatology and Pulmonology)

## 2021-01-15 ENCOUNTER — Ambulatory Visit: Payer: Medicare Other | Admitting: Pharmacist

## 2021-01-15 ENCOUNTER — Other Ambulatory Visit: Payer: Self-pay

## 2021-01-15 VITALS — BP 134/80 | HR 101

## 2021-01-15 DIAGNOSIS — M0609 Rheumatoid arthritis without rheumatoid factor, multiple sites: Secondary | ICD-10-CM

## 2021-01-15 MED ORDER — ENBREL MINI 50 MG/ML ~~LOC~~ SOCT: 50.0000 mg | SUBCUTANEOUS | 0 refills | Status: DC

## 2021-01-15 NOTE — Progress Notes (Signed)
Pharmacy Note  Subjective:   Patient presents to clinic today to receive first dose of Enbrel for rheumatoid arthritis. She is transitioning from Humira. Her last Humira dose was 12/24/20. She continues to take leflunomide '20mg'$  once daily  Patient running a fever or have signs/symptoms of infection? No  Patient currently on antibiotics for the treatment of infection? No  Patient have any upcoming invasive procedures/surgeries? No  Objective: CMP     Component Value Date/Time   NA 139 01/02/2021 0908   NA 141 03/05/2014 1050   K 4.1 01/02/2021 0908   K 3.9 03/05/2014 1050   CL 108 01/02/2021 0908   CO2 25 01/02/2021 0908   CO2 24 03/05/2014 1050   GLUCOSE 101 (H) 01/02/2021 0908   GLUCOSE 99 03/05/2014 1050   BUN 10 01/02/2021 0908   BUN 10.6 03/05/2014 1050   CREATININE 0.65 01/02/2021 0908   CREATININE 0.8 03/05/2014 1050   CALCIUM 9.0 01/02/2021 0908   CALCIUM 8.8 03/05/2014 1050   PROT 6.0 (L) 01/02/2021 0908   PROT 6.5 03/05/2014 1050   ALBUMIN 2.6 (L) 11/18/2020 0955   ALBUMIN 3.2 (L) 03/05/2014 1050   AST 13 01/02/2021 0908   AST 13 (L) 10/10/2019 1402   AST 12 03/05/2014 1050   ALT 12 01/02/2021 0908   ALT 10 10/10/2019 1402   ALT 28 03/05/2014 1050   ALKPHOS 108 11/18/2020 0955   ALKPHOS 85 03/05/2014 1050   BILITOT 0.3 01/02/2021 0908   BILITOT <0.2 (L) 10/10/2019 1402   BILITOT 0.20 03/05/2014 1050   GFRNONAA >60 11/18/2020 0955   GFRNONAA 87 09/03/2020 1045   GFRAA 100 09/03/2020 1045    CBC    Component Value Date/Time   WBC 5.8 01/02/2021 0908   RBC 3.16 (L) 01/02/2021 0908   HGB 9.6 (L) 01/02/2021 0908   HGB 10.6 (L) 10/10/2019 1402   HGB 11.5 (L) 07/31/2014 0923   HCT 30.3 (L) 01/02/2021 0908   HCT 36.6 07/31/2014 0923   PLT 451 (H) 01/02/2021 0908   PLT 268 10/10/2019 1402   PLT 478 (H) 07/31/2014 0923   MCV 95.9 01/02/2021 0908   MCV 96.4 07/31/2014 0923   MCH 30.4 01/02/2021 0908   MCHC 31.7 (L) 01/02/2021 0908   RDW 16.5 (H)  01/02/2021 0908   RDW 17.2 (H) 07/31/2014 0923   LYMPHSABS 650 (L) 01/02/2021 0908   LYMPHSABS 1.3 07/31/2014 0923   MONOABS 0.7 11/18/2020 0955   MONOABS 0.6 07/31/2014 0923   EOSABS 41 01/02/2021 0908   EOSABS 0.0 07/31/2014 0923   BASOSABS 29 01/02/2021 0908   BASOSABS 0.1 07/31/2014 0923    Baseline Immunosuppressant Therapy Labs TB GOLD Quantiferon TB Gold Latest Ref Rng & Units 08/06/2020  Quantiferon TB Gold Plus NEGATIVE NEGATIVE   Hepatitis Panel Hepatitis Latest Ref Rng & Units 08/06/2020  Hep B Surface Ag NON-REACTI NON-REACTIVE  Hep B IgM NON-REACTI NON-REACTIVE  Hep C Ab NON-REACTI NON-REACTIVE  Hep C Ab NON-REACTI NON-REACTIVE   HIV No results found for: HIV Immunoglobulins Immunoglobulin Electrophoresis Latest Ref Rng & Units 05/13/2020  IgG 586 - 1,602 mg/dL 762  IgM 26 - 217 mg/dL 45   SPEP Serum Protein Electrophoresis Latest Ref Rng & Units 01/02/2021  Total Protein 6.1 - 8.1 g/dL 6.0(L)  Albumin 3.8 - 4.8 g/dL -  Alpha-1 0.2 - 0.3 g/dL -  Alpha-2 0.5 - 0.9 g/dL -  Beta Globulin 0.4 - 0.6 g/dL -  Beta 2 0.2 - 0.5 g/dL -  Gamma  Globulin 0.8 - 1.7 g/dL -   Chest x-ray: 11/30/2013 - no active cardiopulmonary disease  Assessment/Plan:  Reviewed holding Enbrel for infection, antibiotics, and surgeries.  Demonstrated proper injection technique with Enbrel demo device  Patient able to demonstrate proper injection technique using the teach back method.  Patient self injected in the right lower abdomen with:  Sample Medication: Enbrel Mini pre-filled cartridge NDC: [phone] Lot: [id number] Expiration: 04/2023  Enbrel Autotouch Device (patient took home) [place]: [id number] Lot: [id number] Expiration: 09/18/2021  Patient tolerated well.  Observed for 30 mins in office for adverse reaction and none noted.   Patient provided with one Enbrel Mini sample to take home for next week's dose while she schedules shipment to her home. [place]: [id number] Lot:  [id number] Expiration: 04/2023  Patient is to return in 1 month for labs and 6-8 weeks for follow-up appointment which is scheduled for 02/17/21.  Standing orders for CBC with diff and CMP with GFR remain in place.   Enbrel approved through [place] patient assistance program until 06/20/21.   Rx sent to: [employer]) for Enbrel: [phone] for 12 week supply with patient assistnace enrollment form.  Patient provided with pharmacy phone number and advised to call later this week to schedule shipment to home.  She will continue Enbrel '50mg'$  SQ once weekly and leflunomide '20mg'$  once daily. She will plan to complete prednisone taper as prescribed.  All questions encouraged and answered.  Instructed patient to call with any further questions or concerns.  Knox Saliva, PharmD, MPH, BCPS Clinical Pharmacist (Rheumatology and Pulmonology)  01/15/2021 8:02 AM

## 2021-01-15 NOTE — Patient Instructions (Addendum)
CONTINUE Enbrel every week. Your next Enbrel dose is due on 8/4, 8/11, and weekly thereafter. Hold Enbrel for infections, if you start antibiotics, or for surgeries.  CONTINUE taking leflunomide (Arava) as prescribed  Your Enbrel prescription will be shipped from [facility] pharmacy which is called [facility] (the manufacturer of Enbrel). Their phone number is [phone].  Please call to schedule shipment and confirm address if you have not done so already. They will mail medication to your home.  Labs are due in 1 month then every 3 months. Lab hours are from Monday to Thursday 1:30-4:30pm and Friday 1:30-4pm. You do not need an appointment if you come for labs during these times.  How to manage an injection site reaction: Remember the 5 C's: COUNTER - leave on the counter at least 30 minutes but up to overnight to bring medication to room temperature. This may help prevent stinging COLD - place something cold (like an ice gel pack or cold water bottle) on the injection site just before cleansing with alcohol. This may help reduce pain CLARITIN - use Claritin (generic name is loratadine) for the first two weeks of treatment or the day of, the day before, and the day after injecting. This will help to minimize injection site reactions CORTISONE CREAM - apply if injection site is irritated and itching CALL ME - if injection site reaction is bigger than the size of your fist, looks infected, blisters, or if you develop hives

## 2021-01-26 ENCOUNTER — Telehealth: Payer: Self-pay | Admitting: Hematology and Oncology

## 2021-01-26 NOTE — Telephone Encounter (Signed)
Rescheduled per provider. Called pt and left a msg

## 2021-02-03 NOTE — Progress Notes (Signed)
Office Visit Note  Patient: Jean [name]             Date of Birth: January 03, 1946           MRN: [id number]             PCP: [name], MD Referring: [name], MD Visit Date: 02/17/2021 Occupation: @GUAROCC @  Subjective:  Medication monitoring   History of Present Illness: Jean [name] is a 75 y.o. female with history of seronegative rheumatoid arthritis and osteoarthritis.  Patient is on Enbrel 50 mg sq injections once weekly and Arava 20 mg 1 tablet by mouth daily.  She was started on Enbrel on 01/15/2021.  She was previously on Humira but had an inadequate response.  She reports that she has had about a 98% improvement in her joint pain and inflammation since starting on Enbrel.  She has been tolerating Enbrel without any side effects or injection site reactions.  She is no longer taking prednisone.  She states that her mobility has improved significantly.  She is no longer having nocturnal pain and denies any morning stiffness.  She states that her knee pain when going up and down steps has improved significantly. She has not been taking Fosamax recently.  She has a difficult time remembering to take it on a weekly basis but plans on restarting it now that she is feeling better. She denies any recent infections.     Activities of Daily Living:  Patient reports morning stiffness for 0 minutes.   Patient Denies nocturnal pain.  Difficulty dressing/grooming: Denies Difficulty climbing stairs: Denies Difficulty getting out of chair: Denies Difficulty using hands for taps, buttons, cutlery, and/or writing: Denies  Review of Systems  Constitutional:  Negative for fatigue.  HENT:  Negative for mouth sores, mouth dryness and nose dryness.   Eyes:  Positive for discharge. Negative for pain, itching and dryness.  Respiratory:  Negative for shortness of breath and difficulty breathing.   Cardiovascular:  Negative for chest pain and palpitations.  Gastrointestinal:   Negative for blood in stool, constipation and diarrhea.  Endocrine: Negative for increased urination.  Genitourinary:  Negative for difficulty urinating.  Musculoskeletal:  Positive for joint pain and joint pain. Negative for joint swelling, myalgias, morning stiffness, muscle tenderness and myalgias.  Skin:  Negative for color change, rash and redness.  Allergic/Immunologic: Negative for susceptible to infections.  Neurological:  Positive for numbness. Negative for dizziness, headaches, memory loss and weakness.  Hematological:  Positive for bruising/bleeding tendency.  Psychiatric/Behavioral:  Negative for confusion.    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   Vitamin D deficiency 01/23/2017   Rheumatoid arthritis, seronegative, multiple sites (HCC) 01/19/2017   High risk medication use 01/19/2017   Primary osteoarthritis of both hands 01/19/2017   Primary osteoarthritis of both knees 01/19/2017   Fatigue 01/19/2017   Thrombocytosis 01/19/2017   Primary osteoarthritis of both feet 01/19/2017   Deficiency anemia 01/19/2017   Screening-pulmonary TB 04/26/2016    Past Medical History:  Diagnosis Date   Arthritis    Cancer (HCC)    History of radiation therapy  05/03/19- 06/25/19   Larynx 35 fractions of 2 Gy each to total 70 Gy   Hypertension    Rheumatoid arthritis (HCC)    Vocal cord mass     Family History  Problem Relation Age of Onset   Stroke Mother    Hypertension Daughter    Past Surgical History:  Procedure Laterality Date   ECTOPIC PREGNANCY SURGERY     EXTERNAL  EAR SURGERY Right    removal of cyst or gland   IR GASTROSTOMY TUBE MOD SED  04/26/2019   IR GASTROSTOMY TUBE REMOVAL  11/05/2019   IR IMAGING GUIDED PORT INSERTION  04/26/2019   IR REMOVAL TUN ACCESS W/ PORT W/O FL MOD SED  11/05/2019   MICROLARYNGOSCOPY Left 03/26/2019   Procedure: DIRECT MICROLARYNGOSCOPY WITH BIOPSY OF VOCAL CORD MASS;  Surgeon: Newman Pies, MD;  Location: [facility];  Service: ENT;  Laterality: Left;   Social History   Social History Narrative   Patient was recently widowed in March 2020.   Patient has 1 daughter who [place] in [place]   Patient moved to [place] from [place] in January 2009.   Patient is a retired [employer] fund[employer].   Immunization History  Administered Date(s) Administered   Fluad Quad(high Dose 65+) 05/20/2020   Influenza, High Dose Seasonal PF 06/09/2017   PFIZER(Purple Top)SARS-COV-2 Vaccination 08/29/2019, 09/19/2019, 07/11/2020     Objective: Vital Signs: BP (!) 145/75 (BP Location: Right Arm, Patient Position: Sitting, Cuff Size: Normal)   Pulse 82   Ht 5\' 3"  (1.6 m)   Wt 113 lb 12.8 oz (51.6 kg)   BMI 20.16 kg/m    Physical Exam Vitals and nursing note reviewed.  Constitutional:      Appearance: She is well-developed.  HENT:     Head: Normocephalic and atraumatic.  Eyes:     Conjunctiva/sclera: Conjunctivae normal.  Pulmonary:     Effort: Pulmonary effort is normal.  Abdominal:     Palpations: Abdomen is soft.  Musculoskeletal:     Cervical back: Normal range of motion.  Skin:    General: Skin is warm and dry.     Capillary Refill: Capillary refill takes less than 2 seconds.  Neurological:     Mental Status: She is alert and oriented to person, place, and time.  Psychiatric:        Behavior: Behavior normal.     Musculoskeletal Exam: C-spine is slightly limited range of motion with lateral rotation.  Thoracic and lumbar spine good range of motion.  Shoulder joints have good range of motion with some  discomfort in the right shoulder.  Bilateral olecranon bursitis noted.  Wrist joints have good range of motion with no tenderness or inflammation.  Some tenderness and synovitis over the right second MCP and PIP joint.  Complete fist formation bilaterally.  PIP and DIP thickening consistent with osteoarthritis of both hands noted.  Hip joints have good range of motion with no discomfort.  Some warmth of the left knee noted.  No knee joint effusion noted.  Ankle joints have good range of motion with no tenderness or joint swelling.  No tenderness over MTP joints.  CDAI Exam: CDAI Score: 4.4  Patient Global: 2 mm; Provider Global: 2 mm Swollen: 4 ; Tender: 0  Joint Exam 02/17/2021      Right  Left  Elbow  Swollen   Swollen   MCP 2  Swollen      PIP 2  Swollen  Investigation: No additional findings.  Imaging: No results found.  Recent Labs: Lab Results  Component Value Date   WBC 5.3 02/13/2021   HGB 12.1 02/13/2021   PLT 324 02/13/2021   NA 140 02/13/2021   K 4.1 02/13/2021   CL 107 02/13/2021   CO2 25 02/13/2021   GLUCOSE 90 02/13/2021   BUN 8 02/13/2021   CREATININE 0.73 02/13/2021   BILITOT 0.2 02/13/2021   ALKPHOS 108 11/18/2020   AST 17 02/13/2021   ALT 15 02/13/2021   PROT 6.6 02/13/2021   ALBUMIN 2.6 (L) 11/18/2020   CALCIUM 9.1 02/13/2021   GFRAA 100 09/03/2020   QFTBGOLDPLUS NEGATIVE 08/06/2020    Speciality Comments: PPD- 07/08/17 Negative, Humira started August 06, 2020-inadequate response (last dose 12/24/20), Enbrel started 01/15/21  Procedures:  No procedures performed Allergies: Sulfa antibiotics   Assessment / Plan:     Visit Diagnoses: Rheumatoid arthritis, seronegative, multiple sites (HCC) - (D/c MTX 8 tab/wk-elevated creatinine-1.07 on 05/09/20, GFR 60): She has some synovitis of the right second MCP and PIP joint as well as ongoing inflammation due to olecranon bursitis in both elbows.  Overall she has noticed about a 98% improvement in her  joint pain and inflammation since starting on Enbrel on 01/15/2021.  She continues to take Arava 20 mg 1 tablet by mouth daily.  She has not been experiencing nocturnal pain and has not had any morning stiffness.  She has not had any difficulty with ADLs and has been able to increase her activity level.  She is no longer taking prednisone.  She will require more time to reassess the true efficacy of Enbrel.  She will remain on combination therapy as prescribed.  She will follow-up in the office in 2 to 3 months to reevaluate.   High risk medication use - Arava 20 mg 1 tablet by mouth daily and Enbrel 50 mg subcutaneous once weekly.  (previously on humira 40mg  every 14 days) CBC and CMP updated on 02/13/2021.  Her next lab work will be due in November and every 3 months to monitor for drug toxicity.  TB Gold negative on 08/06/2020. She has not had any recent infections.  We discussed the importance of holding Enbrel and Arava if she develops signs or symptoms of an infection and to resume once the infection has completely cleared.  She voiced understanding. She plans on receiving annual influenza vaccination.  Chronic left shoulder pain: Resolved.   Primary osteoarthritis of both hands: She has PIP and DIP thickening consistent with osteoarthritis of both hands.  She was able to make a complete fist bilaterally.  She has noticed significant improvement in rheumatoid arthritis since starting on Enbrel.  Discussed the importance of joint protection and muscle strengthening.  Primary osteoarthritis of both knees: Her knee joint pain is improved significantly since starting on Enbrel.  She has had less difficulty climbing steps and rising from a seated position.  She has some warmth of the left knee but no effusion was noted.  She has been able to increase her activity level recently.  Primary osteoarthritis of both feet: She is not having any discomfort in her feet at this time.  No tenderness over MTP joints  noted.  Ankle joints have good range of motion with no tenderness or effusion.  Spondylosis without myelopathy or radiculopathy, cervical region: She has slightly limited ROM with lateral rotation.  No symptoms of radiculopathy at this time.   Age-related osteoporosis without current pathological fracture - Nov 02, 2019 DEXA showed T score of -3.4 in the lumbar region. She has not had any recent falls or fractures.  She has been taking vitamin D 2000 units daily.  She is prescribed fosamax 70 mg 1 tablet by mouth once weekly but has not been compliant taking it on a weekly basis.  Now that her rheumatoid arthritis seems better controlled and she has been feeling better she plans on restarting Fosamax as prescribed.  We discussed that if she cannot tolerate taking Fosamax we can further discuss IV Reclast in the future.  History of vitamin D deficiency: She is taking vitamin D 2000 units daily.   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  Post-menopausal  History of anemia  Thrombocytosis  Abnormal SPEP - followed by oncology.  Orders: No orders of the defined types were placed in this encounter.  No orders of the defined types were placed in this encounter.     Follow-Up Instructions: Return in about 3 months (around 05/20/2021) for Rheumatoid arthritis.   Gearldine Bienenstock, PA-C  Note - This record has been created using Dragon software.  Chart creation errors have been sought, but may not always  have been located. Such creation errors do not reflect on  the standard of medical care.

## 2021-02-07 IMAGING — CT NM PET TUM IMG INITIAL (PI) SKULL BASE T - THIGH
8 series · 25 of 25 positions shown · non-contrast
Comparison: None

CLINICAL DATA: Initial treatment strategy for squamous cell
carcinoma of the vocal cord. History of rheumatoid arthritis and
osteoarthritis.

EXAM:
NUCLEAR MEDICINE PET SKULL BASE TO THIGH
TECHNIQUE: 5.7 mCi F-18 FDG was injected intravenously. Full-ring PET imaging
was performed from the skull base to thigh after the radiotracer. CT
data was obtained and used for attenuation correction and anatomic
localization.
Fasting blood glucose: 92 mg/dl

[Series 4: pet hn_sk_thigh ac · axial · 5.0mm · 4.07mm/px · z∈[-967,-103]mm · 4 of 217 slices shown]
[im 1/217]
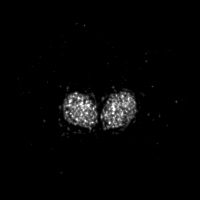
[im 73/217]
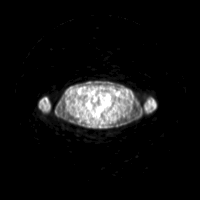
[im 145/217]
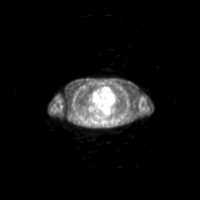
[im 217/217]
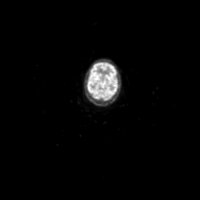

[Series 5: ct hn_sk_th 5.0 b31f · axial · 5.0mm · 0.98mm/px · z∈[-967,-103]mm · 5 of 217 slices shown]
[im 1/217]
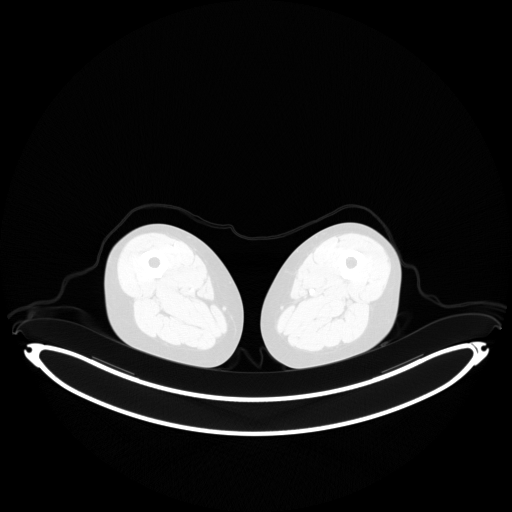
[im 55/217]
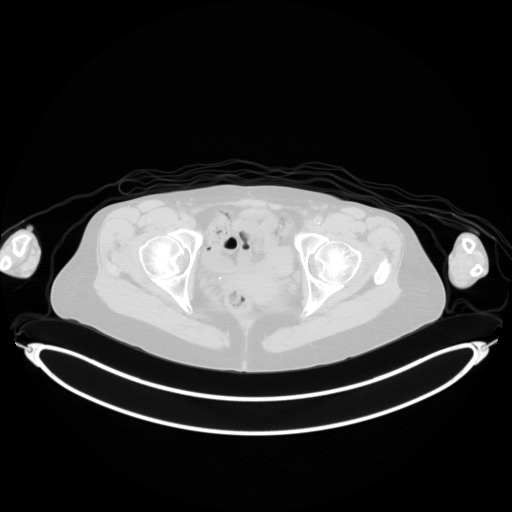
[im 109/217]
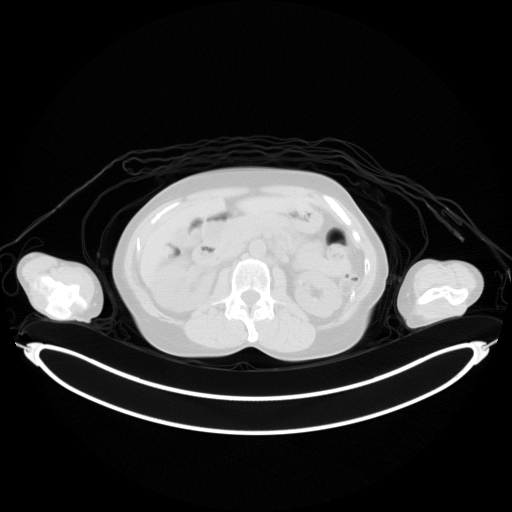
[im 163/217]
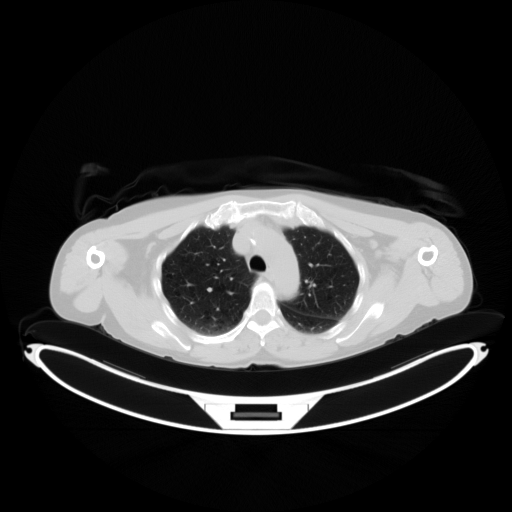
[im 217/217  brain]
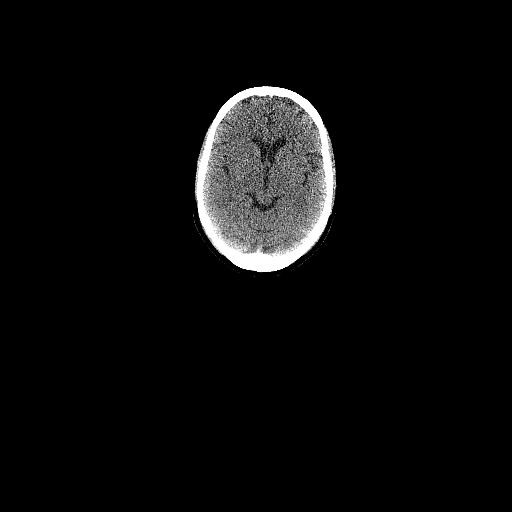

[Series 6: pet hn_sk_thigh nac · axial · 5.0mm · 4.07mm/px · z∈[-967,-103]mm · 5 of 217 slices shown]
[im 1/217]
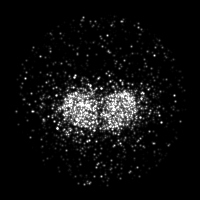
[im 55/217]
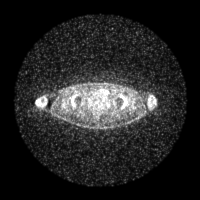
[im 109/217]
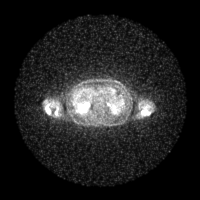
[im 163/217]
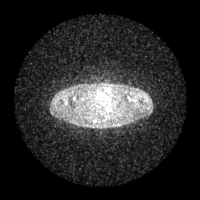
[im 217/217]
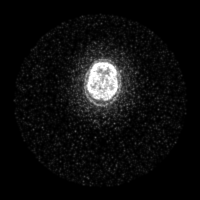

[Series 9: ct hn_sk_th 5.0 b70f (id)_bone · axial · 5.0mm · 0.61mm/px · z∈[-513,-241]mm · 2 of 69 slices shown]
[im 1/69  bone]
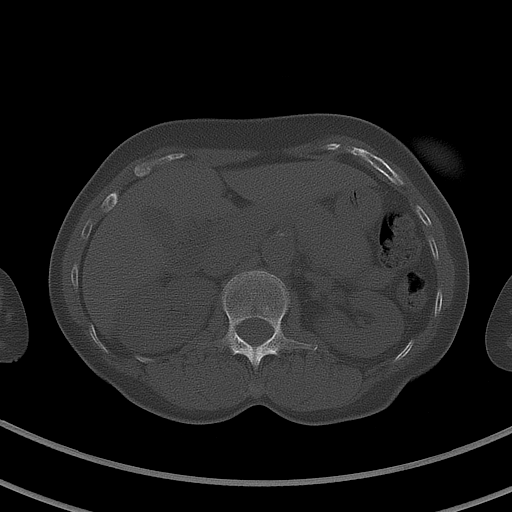
[im 69/69  bone]
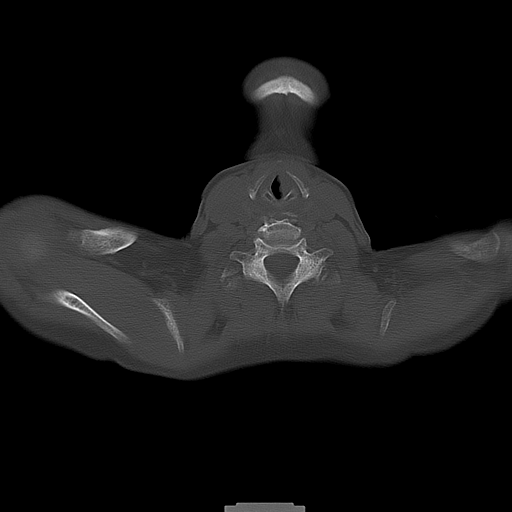

[Series 603: mip range 2 · coronal · 1.79mm/px · 1 of 32 slices shown]
[im 1/32]
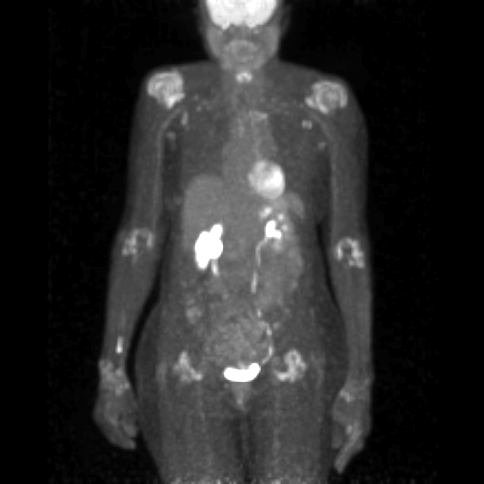

[Series 604: range-ct hn_sk_th 5.0 (id)<alpha range> · 2 of 67 slices shown (1 of 2)]
[im 1/67]
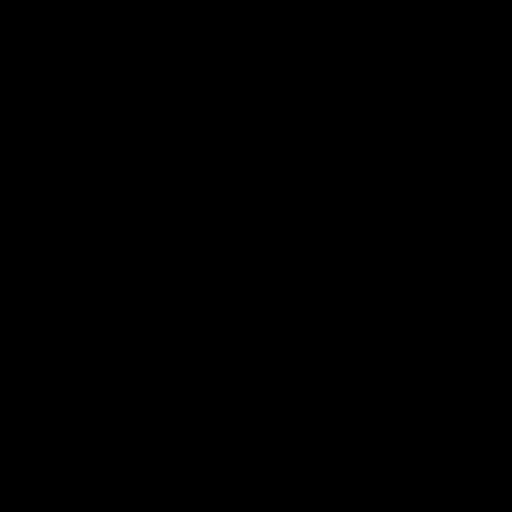
[im 67/67]
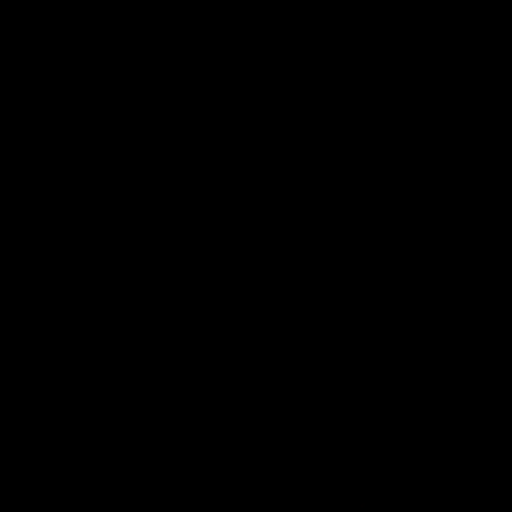

[Series 605: range-ct hn_sk_th 5.0 (id)<alpha range> · 5 of 205 slices shown (2 of 2)]
[im 1/205]
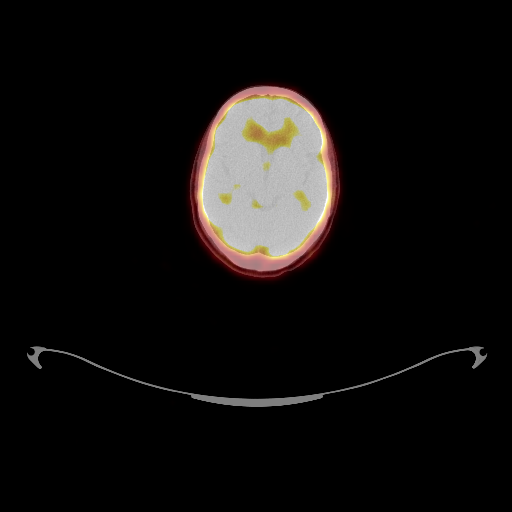
[im 52/205]
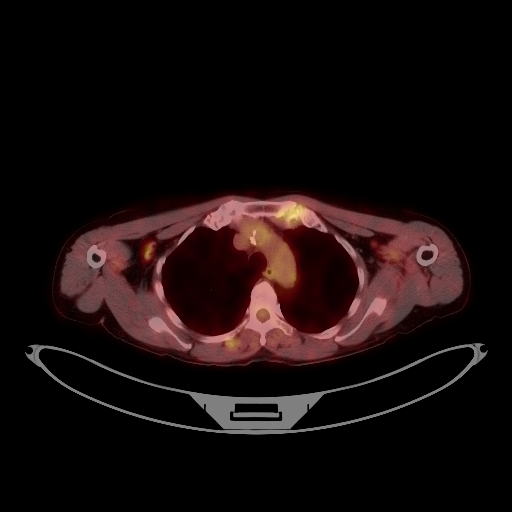
[im 103/205]
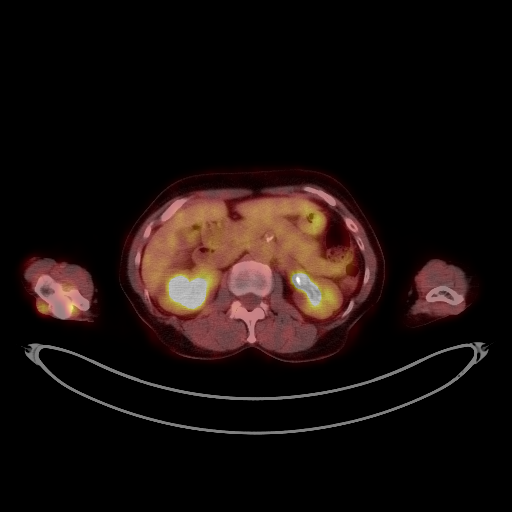
[im 154/205]
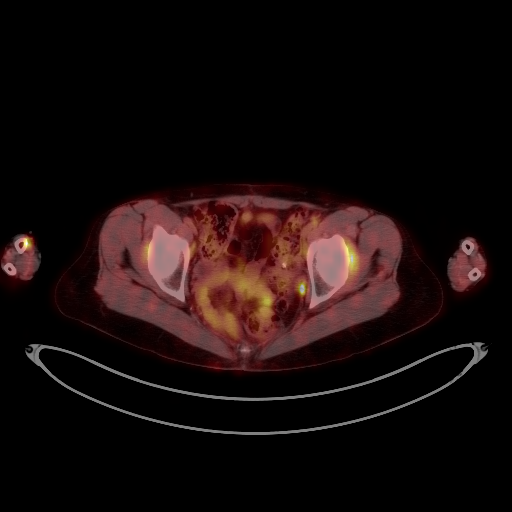
[im 205/205]
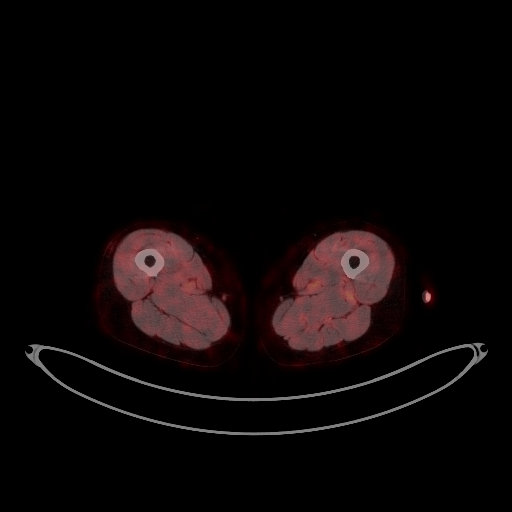

[Series 1110: results mm oncology reading · 5.0mm · 0.56mm/px · 1 of 15 slices shown]
[im 1/15]
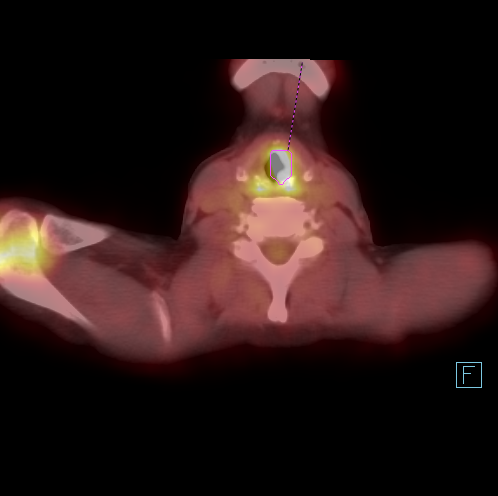

[25 of 25 positions shown; findings below may reference images not displayed]

FINDINGS: Mediastinal blood pool activity: SUV max

Liver activity: SUV max NA

NECK: Left eccentric glottic mass, maximum SUV 8.9.

Incidental CT findings: Mucous retention cyst in left maxillary
sinus. Atherosclerotic calcification in the left common carotid
artery. No hypermetabolic adenopathy in the neck is identified.

CHEST: Diffuse subtle accentuated esophageal activity, maximum SUV
3.8 in the distal esophagus. Given the diffuse nature of this is
probably physiologic. 1.0 cm AP window lymph node, maximum SUV 2.7.

Two small left axillary lymph nodes are both hypermetabolic. The
more anterior node on image 56/5 has a parenchymal thickness of
cm in short axis, maximum SUV 3.6. A lymph node with parenchymal
thickness of 0.5 cm on the right has a maximum SUV of 3.2. Smaller
subpectoral lymph nodes are identified.

Incidental CT findings: Coronary, aortic arch, and branch vessel
atherosclerotic vascular disease. Centrilobular emphysema.

ABDOMEN/PELVIS: Diffuse gastric activity is likely physiologic,
maximum SUV 4.5. Scattered physiologic activity in bowel.

A left external iliac node measuring 0.7 cm in short axis on image
147/5 has maximum SUV of 4.4.

Incidental CT findings: Hypodense hepatic lesions are observed, the
larger posterior right hepatic lobe lesion visibly photopenic,
favoring benign lesions. Mild right hydronephrosis without
hydroureter, query right UPJ narrowing. Prominent stool throughout
the colon favors constipation. Aortoiliac atherosclerotic vascular
disease. Sigmoid colon diverticulosis. Prominence of the left adnexa
versus left eccentric uterus, but without hypermetabolic activity.

SKELETON: Extensive hypermetabolic activity along many of the
visualized large joints including the shoulders, hips, elbows,
wrists, and along the left sternoclavicular joint. Aside from the
sternoclavicular involvement this is mostly symmetric and probably
from active

Incidental CT findings: none
IMPRESSION: 1. Left glottic lesion is hypermetabolic with maximum SUV 8.9,
compatible with malignancy. No discrete adenopathy in the neck.
2. Extensive periarticular activity especially around the large
joints, likely related to rheumatoid arthritis.
3. Small but hypermetabolic axillary and left external iliac lymph
nodes are most likely reactive to the patient's rheumatoid
arthritis, but merit surveillance.
4. Mild right hydronephrosis without hydroureter, query right UPJ
narrowing. Consider follow renal sonography.
5. Prominent but not hypermetabolic left adnexa, possibly due to
prominent ovary or left eccentricity of the uterus, correlate with
surgical history. This could be further investigated with pelvic
sonography if clinically warranted.
6. Other imaging findings of potential clinical significance:
Chronic left maxillary sinusitis. Aortic Atherosclerosis
(UCLR5-7KD.D). Coronary atherosclerosis. Emphysema ([id number].Y).
Sigmoid colon diverticulosis.

## 2021-02-13 ENCOUNTER — Other Ambulatory Visit: Payer: Self-pay | Admitting: *Deleted

## 2021-02-13 DIAGNOSIS — Z79899 Other long term (current) drug therapy: Secondary | ICD-10-CM

## 2021-02-13 LAB — CBC WITH DIFFERENTIAL/PLATELET
Absolute Monocytes: 466 cells/uL (ref 200–950)
Basophils Absolute: 21 cells/uL (ref 0–200)
Basophils Relative: 0.4 %
Eosinophils Absolute: 37 cells/uL (ref 15–500)
Eosinophils Relative: 0.7 %
HCT: 37.8 % (ref 35.0–45.0)
Hemoglobin: 12.1 g/dL (ref 11.7–15.5)
Lymphs Abs: 737 cells/uL — ABNORMAL LOW (ref 850–3900)
MCH: 31.6 pg (ref 27.0–33.0)
MCHC: 32 g/dL (ref 32.0–36.0)
MCV: 98.7 fL (ref 80.0–100.0)
MPV: 10 fL (ref 7.5–12.5)
Monocytes Relative: 8.8 %
Neutro Abs: 4039 cells/uL (ref 1500–7800)
Neutrophils Relative %: 76.2 %
Platelets: 324 10*3/uL (ref 140–400)
RBC: 3.83 10*6/uL (ref 3.80–5.10)
RDW: 15.2 % — ABNORMAL HIGH (ref 11.0–15.0)
Total Lymphocyte: 13.9 %
WBC: 5.3 10*3/uL (ref 3.8–10.8)

## 2021-02-13 LAB — COMPLETE METABOLIC PANEL WITH GFR
AG Ratio: 1.6 (calc) (ref 1.0–2.5)
ALT: 15 U/L (ref 6–29)
AST: 17 U/L (ref 10–35)
Albumin: 4.1 g/dL (ref 3.6–5.1)
Alkaline phosphatase (APISO): 86 U/L (ref 37–153)
BUN: 8 mg/dL (ref 7–25)
CO2: 25 mmol/L (ref 20–32)
Calcium: 9.1 mg/dL (ref 8.6–10.4)
Chloride: 107 mmol/L (ref 98–110)
Creat: 0.73 mg/dL (ref 0.60–1.00)
Globulin: 2.5 g/dL (calc) (ref 1.9–3.7)
Glucose, Bld: 90 mg/dL (ref 65–99)
Potassium: 4.1 mmol/L (ref 3.5–5.3)
Sodium: 140 mmol/L (ref 135–146)
Total Bilirubin: 0.2 mg/dL (ref 0.2–1.2)
Total Protein: 6.6 g/dL (ref 6.1–8.1)
eGFR: 87 mL/min/{1.73_m2} (ref >=60)

## 2021-02-15 NOTE — Progress Notes (Signed)
CBC and CMP are stable.

## 2021-02-17 ENCOUNTER — Other Ambulatory Visit: Payer: Self-pay

## 2021-02-17 ENCOUNTER — Ambulatory Visit: Payer: Medicare Other | Admitting: Physician Assistant

## 2021-02-17 ENCOUNTER — Encounter: Payer: Self-pay | Admitting: Physician Assistant

## 2021-02-17 VITALS — BP 145/75 | HR 82 | Ht 63.0 in | Wt 113.8 lb

## 2021-02-17 DIAGNOSIS — M0609 Rheumatoid arthritis without rheumatoid factor, multiple sites: Secondary | ICD-10-CM | POA: Diagnosis not present

## 2021-02-17 DIAGNOSIS — Z8639 Personal history of other endocrine, nutritional and metabolic disease: Secondary | ICD-10-CM

## 2021-02-17 DIAGNOSIS — M19071 Primary osteoarthritis, right ankle and foot: Secondary | ICD-10-CM

## 2021-02-17 DIAGNOSIS — M19041 Primary osteoarthritis, right hand: Secondary | ICD-10-CM

## 2021-02-17 DIAGNOSIS — M25512 Pain in left shoulder: Secondary | ICD-10-CM

## 2021-02-17 DIAGNOSIS — D75839 Thrombocytosis, unspecified: Secondary | ICD-10-CM

## 2021-02-17 DIAGNOSIS — C320 Malignant neoplasm of glottis: Secondary | ICD-10-CM

## 2021-02-17 DIAGNOSIS — M810 Age-related osteoporosis without current pathological fracture: Secondary | ICD-10-CM

## 2021-02-17 DIAGNOSIS — M47812 Spondylosis without myelopathy or radiculopathy, cervical region: Secondary | ICD-10-CM

## 2021-02-17 DIAGNOSIS — M19042 Primary osteoarthritis, left hand: Secondary | ICD-10-CM

## 2021-02-17 DIAGNOSIS — M170 Bilateral primary osteoarthritis of knee: Secondary | ICD-10-CM

## 2021-02-17 DIAGNOSIS — Z862 Personal history of diseases of the blood and blood-forming organs and certain disorders involving the immune mechanism: Secondary | ICD-10-CM

## 2021-02-17 DIAGNOSIS — R778 Other specified abnormalities of plasma proteins: Secondary | ICD-10-CM

## 2021-02-17 DIAGNOSIS — G8929 Other chronic pain: Secondary | ICD-10-CM

## 2021-02-17 DIAGNOSIS — Z780 Asymptomatic menopausal state: Secondary | ICD-10-CM

## 2021-02-17 DIAGNOSIS — Z79899 Other long term (current) drug therapy: Secondary | ICD-10-CM

## 2021-02-17 DIAGNOSIS — M19072 Primary osteoarthritis, left ankle and foot: Secondary | ICD-10-CM

## 2021-02-17 NOTE — Patient Instructions (Signed)

## 2021-02-18 ENCOUNTER — Ambulatory Visit: Payer: Medicare Other | Admitting: Hematology and Oncology

## 2021-02-18 ENCOUNTER — Ambulatory Visit: Payer: Medicare Other

## 2021-02-18 ENCOUNTER — Other Ambulatory Visit: Payer: Medicare Other

## 2021-02-23 IMAGING — XA IR IMAGING GUIDED PORT INSERTION
1 series · 1 of 1 positions shown · non-contrast
Comparison: None.

INDICATION: 73-year-old with squamous cell carcinoma of the left focal cord.
Port-A-Cath needed for chemotherapy.

EXAM:
FLUOROSCOPIC AND ULTRASOUND GUIDED PLACEMENT OF A SUBCUTANEOUS PORT

[Series 1: fl (-) angio · 1 of 1 slices shown]
[im 1/1]
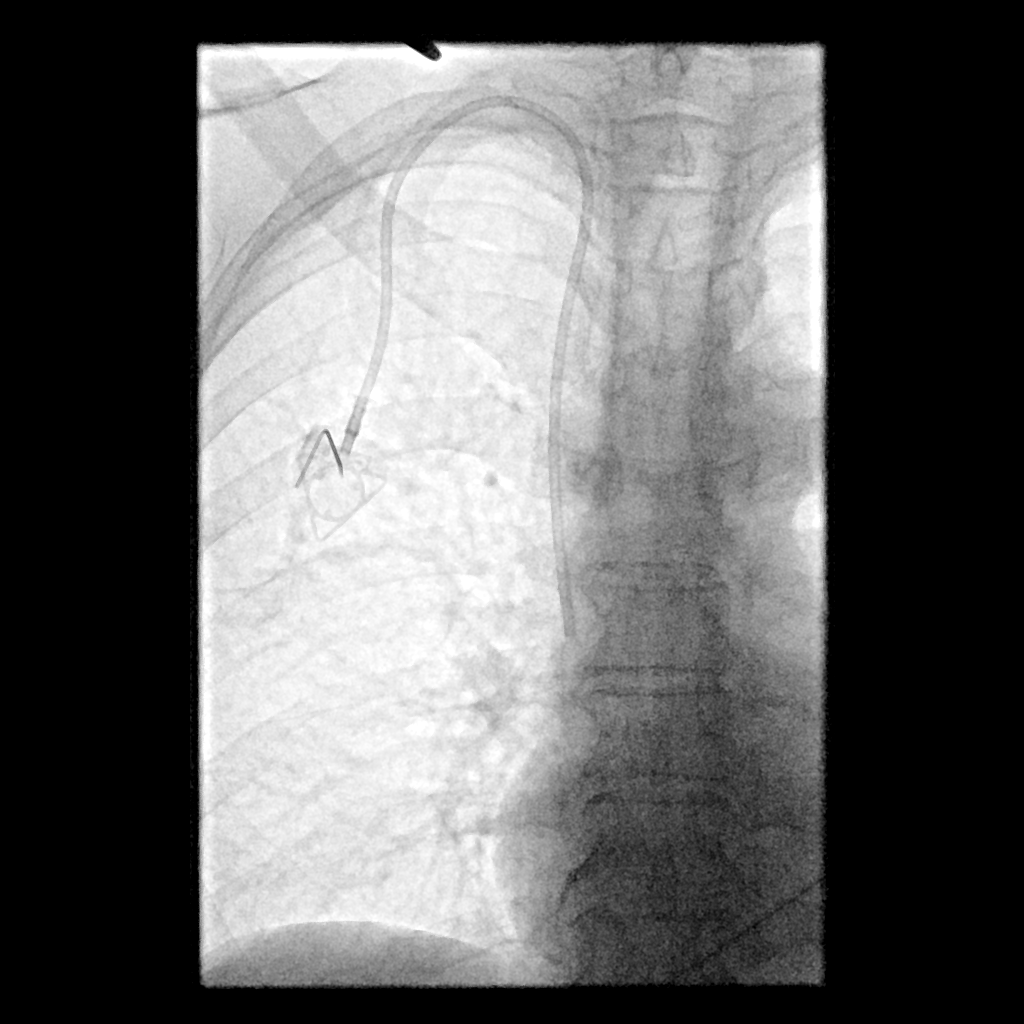

[1 of 1 positions shown; findings below may reference images not displayed]

MEDICATIONS:
Ancef 2 g; The antibiotic was administered within an appropriate
time interval prior to skin puncture.

ANESTHESIA/SEDATION:
Versed 1.5 mg IV; Fentanyl 62.5 mcg IV;

Moderate Sedation Time:  36

The patient was continuously monitored during the procedure by the
interventional radiology nurse under my direct supervision.

FLUOROSCOPY TIME:  42 seconds, 1 mGy

COMPLICATIONS:
None immediate.

PROCEDURE:
The procedure, risks, benefits, and alternatives were explained to
the patient. Questions regarding the procedure were encouraged and
answered. The patient understands and consents to the procedure.

Patient was placed supine on the interventional table. Ultrasound
confirmed a patent right internal jugular vein. Ultrasound image was
saved for documentation. The right chest and neck were cleaned with
a skin antiseptic and a sterile drape was placed. Maximal barrier
sterile technique was utilized including caps, mask, sterile gowns,
sterile gloves, sterile drape, hand hygiene and skin antiseptic. The
right neck was anesthetized with 1% lidocaine. Small incision was
made in the right neck with a blade. Micropuncture set was placed in
the right internal jugular vein with ultrasound guidance. The
micropuncture wire was used for measurement purposes. The right
chest was anesthetized with 1% lidocaine with epinephrine. #15 blade
was used to make an incision and a subcutaneous port pocket was
formed. 8 french Power Port was assembled. Subcutaneous tunnel was
formed with a stiff tunneling device. The port catheter was brought
through the subcutaneous tunnel. The port was placed in the
subcutaneous pocket. The micropuncture set was exchanged for a
peel-away sheath. The catheter was placed through the peel-away
sheath and the tip was placed in the central venous system. However,
the catheter was felt to be too long. Catheter was pulled back and
unfortunately the venous access was lost. Therefore, the right
internal jugular vein was punctured a second time with ultrasound
guidance and micropuncture dilator set was again placed. Catheter
was tunneled through the subcutaneous tissues. Catheter was cut to
an appropriate length. Catheter was advanced through the peel-away
sheath and positioned in the lower SVC. Catheter placement was
confirmed with fluoroscopy. The port was accessed and flushed with
heparinized saline. The port pocket was closed using two layers of
absorbable sutures and Dermabond. The vein skin site was closed
using a single layer of absorbable suture and Dermabond. Sterile
dressings were applied. Patient tolerated the procedure well without
an immediate complication. Ultrasound and fluoroscopic images were
taken and saved for this procedure.
IMPRESSION: Placement of a subcutaneous port device. Catheter tip in the lower
SVC.

## 2021-02-23 IMAGING — XA IR PERC PLACEMENT GASTROSTOMY
1 series · 1 of 1 positions shown · non-contrast
Comparison: none

INDICATION: 73-year-old with squamous cell carcinoma of the left vocal cord.
Patient is scheduled for chemotherapy and radiation.

[Series 1: fl (-) angio · 1 of 1 slices shown]
[im 1/1]
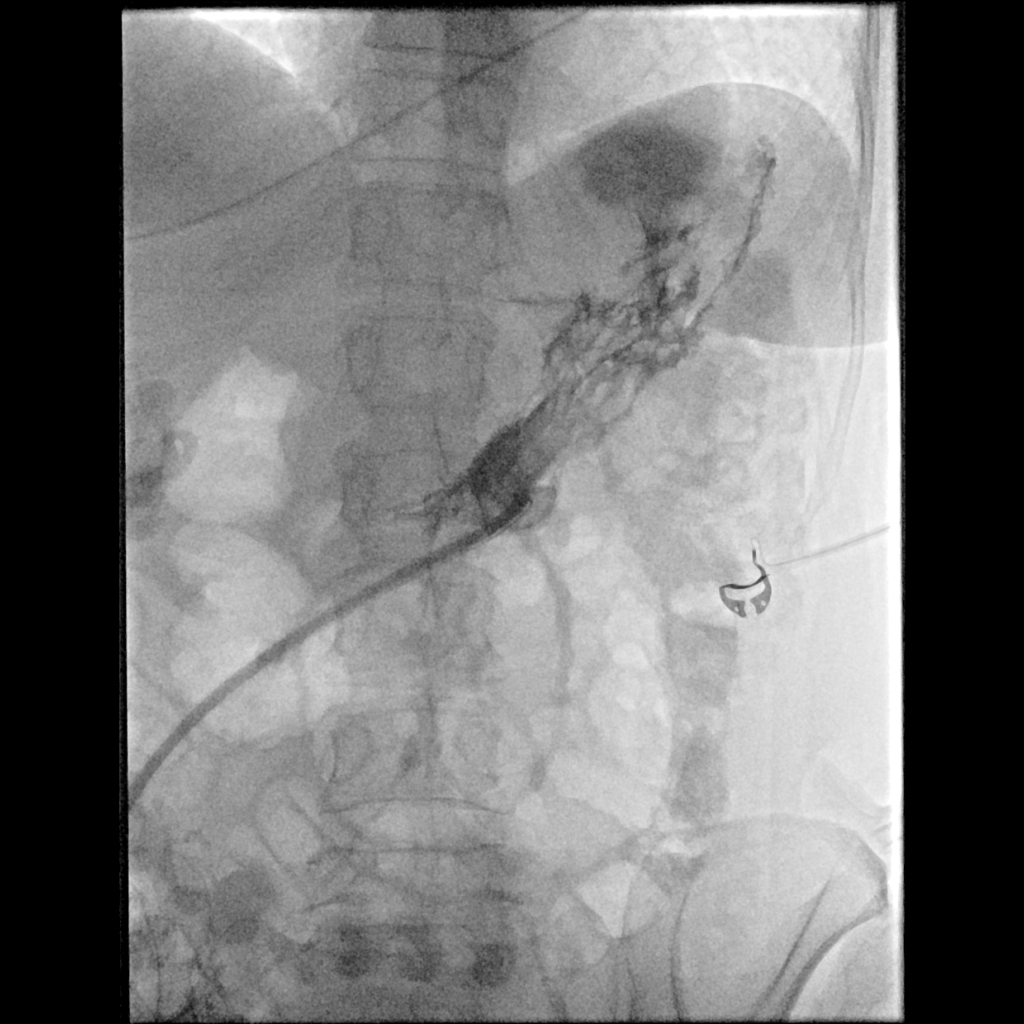

[1 of 1 positions shown; findings below may reference images not displayed]

EXAM:
PERCUTANEOUS GASTROSTOMY TUBE WITH FLUOROSCOPIC GUIDANCE

MEDICATIONS:
Antibiotics were given prior to the Port-A-Cath which was performed
immediately prior to this procedure. Glucagon 1 mg IV

ANESTHESIA/SEDATION:
Versed 1 mg IV; Fentanyl 37.5 mcg IV

Moderate Sedation Time:  14 minutes

The patient was continuously monitored during the procedure by the
interventional radiology nurse under my direct supervision.

FLUOROSCOPY TIME:  Fluoroscopy Time: 2 minutes 54 seconds (7 mGy).

COMPLICATIONS:
None immediate.

PROCEDURE:
The procedure was explained to the patient. The risks and benefits
of the procedure were discussed and the patient's questions were
addressed. Informed consent was obtained from the patient. The
patient was placed on the interventional table. Fluoroscopy
demonstrated oral contrast in the transverse colon. An orogastric
tube was placed with fluoroscopic guidance. The anterior abdomen was
prepped and draped in sterile fashion. Maximal barrier sterile
technique was utilized including caps, mask, sterile gowns, sterile
gloves, sterile drape, hand hygiene and skin antiseptic. Stomach was
inflated with air through the orogastric tube. The skin and
subcutaneous tissues were anesthetized with 1% lidocaine. A 17 gauge
needle was directed into the distended stomach with fluoroscopic
guidance. A wire was advanced into the stomach and Tory Taborda was
deployed. A 9-French vascular sheath was placed and the orogastric
tube was snared using a Gooseneck snare device. The orogastric tube
and snare were pulled out of the patient's mouth. The snare device
was connected to a 20-French gastrostomy tube. The snare device and
gastrostomy tube were pulled through the patient's mouth and out the
anterior abdominal wall. The gastrostomy tube was cut to an
appropriate length. Contrast injection through gastrostomy tube
confirmed placement within the stomach. Fluoroscopic images were
obtained for documentation. The gastrostomy tube was flushed with
normal saline.
IMPRESSION: Successful fluoroscopic guided percutaneous gastrostomy tube
placement.

## 2021-02-25 ENCOUNTER — Other Ambulatory Visit: Payer: Self-pay | Admitting: Hematology and Oncology

## 2021-02-25 DIAGNOSIS — D508 Other iron deficiency anemias: Secondary | ICD-10-CM

## 2021-02-26 ENCOUNTER — Other Ambulatory Visit: Payer: Self-pay | Admitting: Physician Assistant

## 2021-02-26 NOTE — Telephone Encounter (Signed)
Next Visit: 05/22/2021  Last Visit: 02/17/2021  Last Fill: 11/27/2020  DX: Rheumatoid arthritis, seronegative, multiple sites   Current Dose per office note 02/17/2021: Arava 20 mg 1 tablet by mouth daily   Labs: 02/13/2021 CBC and CMP are stable.  Okay to refill Arava?

## 2021-02-27 ENCOUNTER — Inpatient Hospital Stay: Payer: Medicare Other

## 2021-02-27 ENCOUNTER — Encounter: Payer: Self-pay | Admitting: Hematology and Oncology

## 2021-02-27 ENCOUNTER — Inpatient Hospital Stay: Payer: Medicare Other | Attending: Hematology and Oncology

## 2021-02-27 ENCOUNTER — Inpatient Hospital Stay (HOSPITAL_BASED_OUTPATIENT_CLINIC_OR_DEPARTMENT_OTHER): Payer: Medicare Other | Admitting: Hematology and Oncology

## 2021-02-27 ENCOUNTER — Other Ambulatory Visit: Payer: Self-pay

## 2021-02-27 DIAGNOSIS — D472 Monoclonal gammopathy: Secondary | ICD-10-CM | POA: Diagnosis not present

## 2021-02-27 DIAGNOSIS — I10 Essential (primary) hypertension: Secondary | ICD-10-CM | POA: Diagnosis not present

## 2021-02-27 DIAGNOSIS — D508 Other iron deficiency anemias: Secondary | ICD-10-CM

## 2021-02-27 DIAGNOSIS — M810 Age-related osteoporosis without current pathological fracture: Secondary | ICD-10-CM | POA: Insufficient documentation

## 2021-02-27 DIAGNOSIS — D539 Nutritional anemia, unspecified: Secondary | ICD-10-CM

## 2021-02-27 LAB — IRON AND TIBC
Iron: 66 ug/dL (ref 41–142)
Saturation Ratios: 25 % (ref 21–57)
TIBC: 264 ug/dL (ref 236–444)
UIBC: 198 ug/dL (ref 120–384)

## 2021-02-27 LAB — CBC WITH DIFFERENTIAL/PLATELET
Abs Immature Granulocytes: 0.02 10*3/uL (ref 0.00–0.07)
Basophils Absolute: 0 10*3/uL (ref 0.0–0.1)
Basophils Relative: 1 %
Eosinophils Absolute: 0.1 10*3/uL (ref 0.0–0.5)
Eosinophils Relative: 1 %
HCT: 38 % (ref 36.0–46.0)
Hemoglobin: 12 g/dL (ref 12.0–15.0)
Immature Granulocytes: 0 %
Lymphocytes Relative: 15 %
Lymphs Abs: 0.7 10*3/uL (ref 0.7–4.0)
MCH: 31.5 pg (ref 26.0–34.0)
MCHC: 31.6 g/dL (ref 30.0–36.0)
MCV: 99.7 fL (ref 80.0–100.0)
Monocytes Absolute: 0.4 10*3/uL (ref 0.1–1.0)
Monocytes Relative: 9 %
Neutro Abs: 3.5 10*3/uL (ref 1.7–7.7)
Neutrophils Relative %: 74 %
Platelets: 240 10*3/uL (ref 150–400)
RBC: 3.81 MIL/uL — ABNORMAL LOW (ref 3.87–5.11)
RDW: 15.6 % — ABNORMAL HIGH (ref 11.5–15.5)
WBC: 4.8 10*3/uL (ref 4.0–10.5)
nRBC: 0 % (ref 0.0–0.2)

## 2021-02-27 LAB — SEDIMENTATION RATE: Sed Rate: 8 mm/hr (ref 0–22)

## 2021-02-27 LAB — FERRITIN: Ferritin: 132 ng/mL (ref 11–307)

## 2021-02-27 NOTE — Assessment & Plan Note (Addendum)
She had multiple risk factors for osteoporosis Her DEXA scan from last year showed osteoporosis She had not been taking Fosamax I suspect she might have gastritis from Fosamax in the past causing GI bleed Since she started taking omeprazole, her symptoms has resolved I encouraged her to resume taking Fosamax I will check a vitamin D level in her next visit

## 2021-02-27 NOTE — Progress Notes (Signed)
[facility] OFFICE PROGRESS NOTE  Patient Care Team: Charolette Forward, MD as PCP - General (Cardiology) Charolette Forward, MD as Referring Physician (Cardiology) Eppie Gibson, MD as Attending Physician (Radiation Oncology) Leota Sauers, RN (Inactive) as Oncology Nurse Navigator Malmfelt, Stephani Police, RN as Oncology Nurse Navigator (Oncology)  ASSESSMENT & PLAN:  Deficiency anemia After several doses of IV iron, she is no longer anemic She has excellent energy level I plan to recheck iron and B12 level in her next visit I will cancel her scheduled IV iron today  MGUS (monoclonal gammopathy of unknown significance) I did not detect any abnormal M protein or abnormal light chain ratio from my previous blood draw However, on further questioning, she was placed on prednisone recently that could partially treat the problem I plan to recheck MGUS panel again at the end of the year  Osteoporosis She had multiple risk factors for osteoporosis Her DEXA scan from last year showed osteoporosis She had not been taking Fosamax I suspect she might have gastritis from Fosamax in the past causing GI bleed Since she started taking omeprazole, her symptoms has resolved I encouraged her to resume taking Fosamax I will check a vitamin D level in her next visit  Essential hypertension Her blood pressure is profoundly elevated today but could be due to anxiety I recommend she checks her blood pressure at home and follow-up with primary care doctor  Orders Placed This Encounter  Procedures   Comprehensive metabolic panel    Standing Status:   Future    Standing Expiration Date:   02/27/2022   CBC with Differential/Platelet    Standing Status:   Future    Standing Expiration Date:   02/27/2022   Ferritin    Standing Status:   Future    Standing Expiration Date:   02/27/2022   Iron and TIBC    Standing Status:   Future    Standing Expiration Date:   02/27/2022   Sedimentation rate     Standing Status:   Future    Standing Expiration Date:   02/27/2022   Vitamin B12    Standing Status:   Future    Standing Expiration Date:   02/27/2022   Kappa/lambda light chains    Standing Status:   Future    Standing Expiration Date:   02/27/2022   Multiple Myeloma Panel (SPEP&IFE w/QIG)    Standing Status:   Future    Standing Expiration Date:   02/27/2022   VITAMIN D 25 Hydroxy (Vit-D Deficiency, Fractures)    Standing Status:   Future    Standing Expiration Date:   02/27/2022    All questions were answered. The patient knows to call the clinic with any problems, questions or concerns. The total time spent in the appointment was 20 minutes encounter with patients including review of chart and various tests results, discussions about plan of care and coordination of care plan   [name], MD 02/27/2021 9:38 AM  INTERVAL HISTORY: Please see below for problem oriented charting. she returns for treatment follow-up She had received multiple doses of IV iron and felt great She denies bleeding complications or side effects from IV iron infusion She has started taking Prilosec recently and felt great She admits she has not been compliant taking Fosamax Her arthritis treatment was recently changed to Enbrel Her joint pain is better  REVIEW OF SYSTEMS:   Constitutional: Denies fevers, chills or abnormal weight loss Eyes: Denies blurriness of vision Ears, nose,  mouth, throat, and face: Denies mucositis or sore throat Respiratory: Denies cough, dyspnea or wheezes Cardiovascular: Denies palpitation, chest discomfort or lower extremity swelling Gastrointestinal:  Denies nausea, heartburn or change in bowel habits Skin: Denies abnormal skin rashes Lymphatics: Denies new lymphadenopathy or easy bruising Neurological:Denies numbness, tingling or new weaknesses Behavioral/Psych: Mood is stable, no new changes  All other systems were reviewed with the patient and are negative.  I have reviewed  the past medical history, past surgical history, social history and family history with the patient and they are unchanged from previous note.  ALLERGIES:  is allergic to sulfa antibiotics.  MEDICATIONS:  Current Outpatient Medications  Medication Sig Dispense Refill   alendronate (FOSAMAX) 70 MG tablet TAKE 1 TABLET(70 MG) BY MOUTH 1 TIME A WEEK WITH A FULL GLASS OF WATER AND ON AN EMPTY STOMACH (Patient not taking: No sig reported) 12 tablet 0   Cholecalciferol (VITAMIN D) 50 MCG (2000 UT) CAPS Take 2,000 Units by mouth daily.      Docusate Calcium (STOOL SOFTENER PO) Take by mouth.     Etanercept (ENBREL MINI) 50 MG/ML SOCT Inject 50 mg into the skin once a week. 12 mL 0   folic acid (FOLVITE) 1 MG tablet Take 2 mg by mouth daily.     leflunomide (ARAVA) 20 MG tablet TAKE 1 TABLET(20 MG) BY MOUTH DAILY 90 tablet 0   losartan (COZAAR) 100 MG tablet Take 100 mg by mouth daily.   0   metoprolol succinate (TOPROL-XL) 50 MG 24 hr tablet Take 50 mg by mouth daily.     omeprazole (PRILOSEC) 40 MG capsule Take 1 capsule (40 mg total) by mouth daily. 90 capsule 3   TURMERIC CURCUMIN PO Take 1,500 mg by mouth 3 (three) times daily. With bioperine     vitamin B-12 (CYANOCOBALAMIN) 1000 MCG tablet Take 1,000 mcg by mouth daily.     No current facility-administered medications for this visit.    SUMMARY OF ONCOLOGIC HISTORY: Oncology History  Squamous cell carcinoma of left vocal cord ([place])  03/26/2019 Pathology Results   DIAGNOSIS:   A. VOCAL CORD MASS, LEFT, EXCISION:  - Squamous cell carcinoma.  - See comment.    04/06/2019 Initial Diagnosis   Squamous cell carcinoma of left vocal cord ([place])   04/10/2019 Imaging   PET: 1. Left glottic lesion is hypermetabolic with maximum SUV 8.9, compatible with malignancy. No discrete adenopathy in the neck. 2. Extensive periarticular activity especially around the large joints, likely related to rheumatoid arthritis. 3. Small but hypermetabolic  axillary and left external iliac lymph nodes are most likely reactive to the patient's rheumatoid arthritis, but merit surveillance. 4. Mild right hydronephrosis without hydroureter, query right UPJ narrowing. Consider follow renal sonography. 5. Prominent but not hypermetabolic left adnexa, possibly due to prominent ovary or left eccentricity of the uterus, correlate with surgical history. This could be further investigated with pelvic sonography if clinically warranted. 6. Other imaging findings of potential clinical significance: Chronic left maxillary sinusitis. Aortic Atherosclerosis (ICD10-I70.0). Coronary atherosclerosis. Emphysema (ICD10-J43.9). Sigmoid colon diverticulosis.   04/10/2019 Imaging   CT neck w/ contrast:  1. Left glottic mass extending to but not beyond the anterior commissure. There is extension into the left paraglottic fat without cartilage erosion. 2. No adenopathy. 3. Aortic Atherosclerosis (ICD10-I70.0) and Emphysema (ICD10-J43.9).   04/11/2019 Cancer Staging   Staging form: Larynx - Glottis, AJCC 8th Edition - Clinical stage from 04/11/2019: Stage III (cT3, cN0, cM0) - Signed by Eppie Gibson, MD  on 04/11/2019   05/03/2019 - 06/29/2019 Chemotherapy   The patient had cisplatin for chemotherapy treatment.     10/08/2019 PET scan   1. Complete metabolic response to therapy of left-sided glottic primary. 2. Suboptimal evaluation for cervical nodal disease secondary to extensive hypermetabolic "brown" fat within the neck and chest. Given this limitation, no evidence of metastatic disease. 3. Decreased size of hypermetabolic mediastinal node, likely reactive. 4. Aortic atherosclerosis (ICD10-I70.0), coronary artery atherosclerosis and emphysema (ICD10-J43.9). 5. Similar appearance of the right kidney, possibly related to chronic ureteropelvic junction obstruction versus pyelonephritis. 6. Left nephrolithiasis.   11/05/2019 Procedure   Successful right IJ vein  Port-A-Cath explantation.   Successful removal of percutaneous gastrostomy tube     PHYSICAL EXAMINATION: ECOG PERFORMANCE STATUS: 0 - Asymptomatic  Vitals:   02/27/21 0851  BP: (!) 168/79  Pulse: 69  Resp: 18  Temp: 98 F (36.7 C)  SpO2: 100%   Filed Weights   02/27/21 0851  Weight: 115 lb 12.8 oz (52.5 kg)    GENERAL:alert, no distress and comfortable NEURO: alert & oriented x 3 with fluent speech, no focal motor/sensory deficits  LABORATORY DATA:  I have reviewed the data as listed    Component Value Date/Time   NA 140 02/13/2021 0855   NA 141 03/05/2014 1050   K 4.1 02/13/2021 0855   K 3.9 03/05/2014 1050   CL 107 02/13/2021 0855   CO2 25 02/13/2021 0855   CO2 24 03/05/2014 1050   GLUCOSE 90 02/13/2021 0855   GLUCOSE 99 03/05/2014 1050   BUN 8 02/13/2021 0855   BUN 10.6 03/05/2014 1050   CREATININE 0.73 02/13/2021 0855   CREATININE 0.8 03/05/2014 1050   CALCIUM 9.1 02/13/2021 0855   CALCIUM 8.8 03/05/2014 1050   PROT 6.6 02/13/2021 0855   PROT 6.5 03/05/2014 1050   ALBUMIN 2.6 (L) 11/18/2020 0955   ALBUMIN 3.2 (L) 03/05/2014 1050   AST 17 02/13/2021 0855   AST 13 (L) 10/10/2019 1402   AST 12 03/05/2014 1050   ALT 15 02/13/2021 0855   ALT 10 10/10/2019 1402   ALT 28 03/05/2014 1050   ALKPHOS 108 11/18/2020 0955   ALKPHOS 85 03/05/2014 1050   BILITOT 0.2 02/13/2021 0855   BILITOT <0.2 (L) 10/10/2019 1402   BILITOT 0.20 03/05/2014 1050   GFRNONAA >60 11/18/2020 0955   GFRNONAA 87 09/03/2020 1045   GFRAA 100 09/03/2020 1045    No results found for: SPEP, UPEP  Lab Results  Component Value Date   WBC 4.8 02/27/2021   NEUTROABS 3.5 02/27/2021   HGB 12.0 02/27/2021   HCT 38.0 02/27/2021   MCV 99.7 02/27/2021   PLT 240 02/27/2021      Chemistry      Component Value Date/Time   NA 140 02/13/2021 0855   NA 141 03/05/2014 1050   K 4.1 02/13/2021 0855   K 3.9 03/05/2014 1050   CL 107 02/13/2021 0855   CO2 25 02/13/2021 0855   CO2 24  03/05/2014 1050   BUN 8 02/13/2021 0855   BUN 10.6 03/05/2014 1050   CREATININE 0.73 02/13/2021 0855   CREATININE 0.8 03/05/2014 1050      Component Value Date/Time   CALCIUM 9.1 02/13/2021 0855   CALCIUM 8.8 03/05/2014 1050   ALKPHOS 108 11/18/2020 0955   ALKPHOS 85 03/05/2014 1050   AST 17 02/13/2021 0855   AST 13 (L) 10/10/2019 1402   AST 12 03/05/2014 1050   ALT 15 02/13/2021 0855  ALT 10 10/10/2019 1402   ALT 28 03/05/2014 1050   BILITOT 0.2 02/13/2021 0855   BILITOT <0.2 (L) 10/10/2019 1402   BILITOT 0.20 03/05/2014 1050

## 2021-02-27 NOTE — Assessment & Plan Note (Signed)
After several doses of IV iron, she is no longer anemic She has excellent energy level I plan to recheck iron and B12 level in her next visit I will cancel her scheduled IV iron today

## 2021-02-27 NOTE — Assessment & Plan Note (Signed)
Her blood pressure is profoundly elevated today but could be due to anxiety I recommend she checks her blood pressure at home and follow-up with primary care doctor

## 2021-02-27 NOTE — Assessment & Plan Note (Signed)
I did not detect any abnormal M protein or abnormal light chain ratio from my previous blood draw However, on further questioning, she was placed on prednisone recently that could partially treat the problem I plan to recheck MGUS panel again at the end of the year

## 2021-03-06 ENCOUNTER — Other Ambulatory Visit: Payer: Self-pay | Admitting: Rheumatology

## 2021-03-06 NOTE — Telephone Encounter (Signed)
Next Visit: 05/22/2021   Last Visit: 02/17/2021   Last Fill: 12/15/2020   DX: Rheumatoid arthritis, seronegative, multiple sites    Current Dose per office note 02/17/2021: fosamax 70 mg 1 tablet by mouth once weekly   Labs: 02/13/2021 CBC and CMP are stable.   Okay to refill Fosamax?

## 2021-03-11 ENCOUNTER — Encounter: Payer: Self-pay | Admitting: Gastroenterology

## 2021-03-21 ENCOUNTER — Other Ambulatory Visit: Payer: Self-pay | Admitting: Physician Assistant

## 2021-04-08 ENCOUNTER — Encounter: Payer: Self-pay | Admitting: Gastroenterology

## 2021-04-08 ENCOUNTER — Ambulatory Visit (INDEPENDENT_AMBULATORY_CARE_PROVIDER_SITE_OTHER): Payer: Medicare Other | Admitting: Gastroenterology

## 2021-04-08 VITALS — BP 120/58 | HR 88 | Ht 63.0 in | Wt 115.2 lb

## 2021-04-08 DIAGNOSIS — D508 Other iron deficiency anemias: Secondary | ICD-10-CM

## 2021-04-08 DIAGNOSIS — M810 Age-related osteoporosis without current pathological fracture: Secondary | ICD-10-CM

## 2021-04-08 MED ORDER — FAMOTIDINE 20 MG PO TABS: 20.0000 mg | ORAL_TABLET | Freq: Two times a day (BID) | ORAL | 3 refills | Status: DC | PRN

## 2021-04-08 NOTE — Patient Instructions (Signed)
If you are age 73 or older, your body mass index should be between 23-30. Your Body mass index is 20.41 kg/m. If this is out of the aforementioned range listed, please consider follow up with your Primary Care Provider.  If you are age 43 or younger, your body mass index should be between 19-25. Your Body mass index is 20.41 kg/m. If this is out of the aformentioned range listed, please consider follow up with your Primary Care Provider.  We have sent the following medications to your pharmacy for you to pick up at your convenience: Pepcid 20 mg twice daily as needed.  Jeris Penta off of Prilosec every other day for 1-2 weeks.  The [facility] providers would like to encourage you to use [facility] to communicate with providers for non-urgent requests or questions.  Due to long hold times on the telephone, sending your provider a message by [facility] may be a faster and more efficient way to get a response.  Please allow 48 business hours for a response.  Please remember that this is for non-urgent requests.   It was a pleasure to see you today!  Thank you for trusting me with your gastrointestinal care!    Scott E. Candis Schatz, MD

## 2021-04-08 NOTE — Progress Notes (Signed)
HPI : Jean [name] is a very pleasant 75 year old female with rheumatoid arthritis, osteroporosis and a history of laryngeal cancer who is referred to [place] by Dr. Charolette Forward for further evaluation of iron deficiency anemia.  The patient was noted to have a drop in her hemoglobin in January-March of this year, going from a baseline of 11-12 down to a nadir of 8.5.  Iron panel in March was normal but an iron panel in June was consistent with iron deficiency.  She received 2 IV iron infusions and her iron and hemoglobin returned to normal with improvement in her fatigue.  Also during this time her RA was very active.  She was on prednisone and was taking Motrin frequently.  She was experiencing a lot of bloating and dyspepsia/abdominal discomfort for several months.  She was started on Enbrel for her RA, and this significantly improved her joint pains.  She was weaned off steroids and she stopped taking Motrin.  She was started on Prilosec around June and her abdominal pain/bloating resolved.  She denies ever noticing any blood in the stool, either hematochezia or melena.  Her bowel habits did not change during that time.  She has chronic constipation which is managed with stool softeners. The patient states that she submitted a stool sample in June to check for occult blood and that this was negative, but I cannot find a record of this test.   Past Medical History:  Diagnosis Date   Arthritis    Cancer ([place])    History of radiation therapy 05/03/19- 06/25/19   Larynx 35 fractions of 2 Gy each to total 70 Gy   Hypertension    Rheumatoid arthritis ([facility])    Vocal cord mass      Past Surgical History:  Procedure Laterality Date   ECTOPIC PREGNANCY SURGERY     EXTERNAL EAR SURGERY Right    removal of cyst or gland   IR GASTROSTOMY TUBE MOD SED  04/26/2019   IR GASTROSTOMY TUBE REMOVAL  11/05/2019   IR IMAGING GUIDED PORT INSERTION  04/26/2019   IR REMOVAL TUN ACCESS W/ PORT W/O FL MOD SED   11/05/2019   MICROLARYNGOSCOPY Left 03/26/2019   Procedure: DIRECT MICROLARYNGOSCOPY WITH BIOPSY OF VOCAL CORD MASS;  Surgeon: Leta Baptist, MD;  Location: [facility];  Service: ENT;  Laterality: Left;   Family History  Problem Relation Age of Onset   Stroke Mother    Hypertension Daughter    Colon polyps Neg Hx    Colon cancer Neg Hx    Social History   Tobacco Use   Smoking status: Former    Packs/day: 1.00    Years: 30.00    Pack years: 30.00    Types: Cigarettes    Quit date: 11/08/2003    Years since quitting: 17.4   Smokeless tobacco: Never  Vaping Use   Vaping Use: Never used  Substance Use Topics   Alcohol use: Not Currently   Drug use: No   Current Outpatient Medications  Medication Sig Dispense Refill   Cholecalciferol (VITAMIN D) 50 MCG (2000 UT) CAPS Take 2,000 Units by mouth daily.      Docusate Calcium (STOOL SOFTENER PO) Take by mouth.     Etanercept (ENBREL MINI) 50 MG/ML SOCT Inject 50 mg into the skin once a week. 12 mL 0   folic acid (FOLVITE) 1 MG tablet Take 2 mg by mouth daily.     leflunomide (ARAVA) 20 MG tablet TAKE  1 TABLET(20 MG) BY MOUTH DAILY 90 tablet 0   losartan (COZAAR) 100 MG tablet Take 100 mg by mouth daily.   0   metoprolol succinate (TOPROL-XL) 50 MG 24 hr tablet Take 50 mg by mouth daily.     omeprazole (PRILOSEC) 40 MG capsule Take 1 capsule (40 mg total) by mouth daily. 90 capsule 3   TURMERIC CURCUMIN PO Take 1,500 mg by mouth 3 (three) times daily. With bioperine     vitamin B-12 (CYANOCOBALAMIN) 1000 MCG tablet Take 1,000 mcg by mouth daily.     alendronate (FOSAMAX) 70 MG tablet TAKE 1 TABLET(70 MG) BY MOUTH 1 TIME A WEEK WITH A FULL GLASS OF WATER AND ON AN EMPTY STOMACH (Patient not taking: Reported on 04/08/2021) 12 tablet 0   No current facility-administered medications for this visit.   Allergies  Allergen Reactions   Sulfa Antibiotics Swelling    Turned pink and rashy     Review of Systems: All systems  reviewed and negative except where noted in HPI.    No results found.  Physical Exam: BP (!) 120/58   Pulse 88   Ht 5\' 3"  (1.6 m)   Wt 115 lb 3.2 oz (52.3 kg)   BMI 20.41 kg/m  Constitutional: Pleasant,well-developed, African American female in no acute distress. HEENT: Normocephalic and atraumatic. Conjunctivae are normal. No scleral icterus.  Cardiovascular: Normal rate, regular rhythm.  Pulmonary/chest: Effort normal and breath sounds normal. No wheezing, rales or rhonchi. Abdominal: Soft, nondistended, nontender. Bowel sounds active throughout. There are no masses palpable. No hepatomegaly. Extremities: no edema Neurological: Alert and oriented to person place and time. Skin: Skin is warm and dry. No rashes noted. Psychiatric: Normal mood and affect. Behavior is normal.  CBC    Component Value Date/Time   WBC 4.8 02/27/2021 0822   RBC 3.81 (L) 02/27/2021 0822   HGB 12.0 02/27/2021 0822   HGB 10.6 (L) 10/10/2019 1402   HGB 11.5 (L) 07/31/2014 0923   HCT 38.0 02/27/2021 0822   HCT 36.6 07/31/2014 0923   PLT 240 02/27/2021 0822   PLT 268 10/10/2019 1402   PLT 478 (H) 07/31/2014 0923   MCV 99.7 02/27/2021 0822   MCV 96.4 07/31/2014 0923   MCH 31.5 02/27/2021 0822   MCHC 31.6 02/27/2021 0822   RDW 15.6 (H) 02/27/2021 0822   RDW 17.2 (H) 07/31/2014 0923   LYMPHSABS 0.7 02/27/2021 0822   LYMPHSABS 1.3 07/31/2014 0923   MONOABS 0.4 02/27/2021 0822   MONOABS 0.6 07/31/2014 0923   EOSABS 0.1 02/27/2021 0822   EOSABS 0.0 07/31/2014 0923   BASOSABS 0.0 02/27/2021 0822   BASOSABS 0.1 07/31/2014 0923    CMP     Component Value Date/Time   NA 140 02/13/2021 0855   NA 141 03/05/2014 1050   K 4.1 02/13/2021 0855   K 3.9 03/05/2014 1050   CL 107 02/13/2021 0855   CO2 25 02/13/2021 0855   CO2 24 03/05/2014 1050   GLUCOSE 90 02/13/2021 0855   GLUCOSE 99 03/05/2014 1050   BUN 8 02/13/2021 0855   BUN 10.6 03/05/2014 1050   CREATININE 0.73 02/13/2021 0855   CREATININE  0.8 03/05/2014 1050   CALCIUM 9.1 02/13/2021 0855   CALCIUM 8.8 03/05/2014 1050   PROT 6.6 02/13/2021 0855   PROT 6.5 03/05/2014 1050   ALBUMIN 2.6 (L) 11/18/2020 0955   ALBUMIN 3.2 (L) 03/05/2014 1050   AST 17 02/13/2021 0855   AST 13 (L) 10/10/2019 1402   AST  12 03/05/2014 1050   ALT 15 02/13/2021 0855   ALT 10 10/10/2019 1402   ALT 28 03/05/2014 1050   ALKPHOS 108 11/18/2020 0955   ALKPHOS 85 03/05/2014 1050   BILITOT 0.2 02/13/2021 0855   BILITOT <0.2 (L) 10/10/2019 1402   BILITOT 0.20 03/05/2014 1050   GFRNONAA >60 11/18/2020 0955   GFRNONAA 87 09/03/2020 1045   GFRAA 100 09/03/2020 1045   Component Ref Range & Units 1 mo ago 4 mo ago 7 mo ago 1 yr ago  Iron 41 - 142 ug/dL 66  15 Low   60  62   TIBC 236 - 444 ug/dL 264  211 Low   273  298   Saturation Ratios 21 - 57 % 25  7 Low   22  21   UIBC 120 - 384 ug/dL 198  196 CM  213 CM  235 CM    Component Ref Range & Units 1 mo ago 4 mo ago 7 mo ago 1 yr ago  Ferritin 11 - 307 ng/mL 132  159 CM  20 CM  68 CM    Component Ref Range & Units 1 mo ago  (02/27/21) 1 mo ago  (02/13/21) 3 mo ago  (01/02/21) 4 mo ago  (11/18/20) 7 mo ago  (09/08/20) 7 mo ago  (09/03/20) 9 mo ago  (07/08/20)  WBC 4.0 - 10.5 K/uL 4.8  5.3 R  5.8 R  8.8  8.7  6.7 R  6.1 R   RBC 3.87 - 5.11 MIL/uL 3.81 Low   3.83 R  3.16 Low  R  3.01 Low   3.18 Low   3.04 Low  R  3.38 Low  R   Hemoglobin 12.0 - 15.0 g/dL 12.0  12.1 R  9.6 Low  R  8.7 Low   8.9 Low   8.5 Low  R  10.3 Low  R   HCT 36.0 - 46.0 % 38.0  37.8 R  30.3 Low  R  28.5 Low   29.5 Low   27.6 Low  R  31.9 Low  R   MCV 80.0 - 100.0 fL 99.7  98.7  95.9  94.7  92.8  90.8     ASSESSMENT AND PLAN: 75 year old female with RA and history of laryngeal cancer with recent iron deficiency anemia in the setting an RA flare requiring steroids and frequent NSAIDs, accompanied by abdominal pain and bloating, with resolution of anemia and GI symptoms following initiation of Enbrel, cessation of steroids/NSAIDs,  initiation of PPI therapy, and IV iron replacement.  Currently her hemoglobin and iron indices are normal and she does not have any GI symptoms.  I suspect she was most likely experiencing NSAID gastropathy with chronic superficial oozing, and that this resolved with NSAID cessation and PPI therapy.  I recommended an EGD to further evaluate but the patient queried whether it was absolutely necessary, given resolution of her symptoms and anemia.  Given the transient nature of the anemia, I do not think it is absolutely necessary that endoscopy be performed, as long as continued monitoring of her hemoglobin is performed.  Should her hbg decline again, an endoscopic evaluation would be necessary.  With regards to her PPI therapy, given her osteoporosis, I recommended she try to wean off the PPI.  She has never tried an H2RA, so I recommended she decrease her Prilosec to every other day dosing, and start using Pepcid as needed.  If no symptoms after 1-2 weeks of  qod PPI, she should stop the omeprazole completely and can take the Pepcid either daily or as needed.  Since she is no longer taking NSAIDs/Prednisone, she may no longer need acid suppression.  Iron deficiency, resolved - Continue to monitor CBC via heme/rheum - EGD/colonoscopy if hgb begins to decline again  Osteoporosis - Wean off PPI - Use pepcid as needed  Kataryna Mcquilkin E. Candis Schatz, MD  Gastroenterology  CC: Charolette Forward, MD

## 2021-04-10 ENCOUNTER — Encounter: Payer: Self-pay | Admitting: Gastroenterology

## 2021-04-15 ENCOUNTER — Other Ambulatory Visit: Payer: Self-pay | Admitting: *Deleted

## 2021-04-15 DIAGNOSIS — M0609 Rheumatoid arthritis without rheumatoid factor, multiple sites: Secondary | ICD-10-CM

## 2021-04-15 MED ORDER — ENBREL MINI 50 MG/ML ~~LOC~~ SOCT: 50.0000 mg | SUBCUTANEOUS | 0 refills | Status: DC

## 2021-04-15 NOTE — Telephone Encounter (Signed)
Refill request received via fax  Next Visit: 05/22/2021  Last Visit: 02/17/2021  Last Fill: 01/16/72  [id number] arthritis, seronegative, multiple sites   Current Dose per office note 02/17/2021: Enbrel 50 mg subcutaneous once weekly.  Labs: 02/13/2021 CBC and CMP are stable.  TB Gold: 08/06/2020 Neg    Okay to refill Enbrel?

## 2021-04-17 ENCOUNTER — Other Ambulatory Visit: Payer: Self-pay | Admitting: *Deleted

## 2021-04-17 DIAGNOSIS — Z79899 Other long term (current) drug therapy: Secondary | ICD-10-CM

## 2021-04-17 LAB — COMPLETE METABOLIC PANEL WITH GFR
AG Ratio: 1.7 (calc) (ref 1.0–2.5)
ALT: 12 U/L (ref 6–29)
AST: 15 U/L (ref 10–35)
Albumin: 4.1 g/dL (ref 3.6–5.1)
Alkaline phosphatase (APISO): 96 U/L (ref 37–153)
BUN: 13 mg/dL (ref 7–25)
CO2: 27 mmol/L (ref 20–32)
Calcium: 9.6 mg/dL (ref 8.6–10.4)
Chloride: 105 mmol/L (ref 98–110)
Creat: 0.81 mg/dL (ref 0.60–1.00)
Globulin: 2.4 g/dL (calc) (ref 1.9–3.7)
Glucose, Bld: 103 mg/dL — ABNORMAL HIGH (ref 65–99)
Potassium: 4.1 mmol/L (ref 3.5–5.3)
Sodium: 140 mmol/L (ref 135–146)
Total Bilirubin: 0.2 mg/dL (ref 0.2–1.2)
Total Protein: 6.5 g/dL (ref 6.1–8.1)
eGFR: 77 mL/min/{1.73_m2} (ref >=60)

## 2021-04-17 LAB — CBC WITH DIFFERENTIAL/PLATELET
Absolute Monocytes: 505 cells/uL (ref 200–950)
Basophils Absolute: 31 cells/uL (ref 0–200)
Basophils Relative: 0.6 %
Eosinophils Absolute: 51 cells/uL (ref 15–500)
Eosinophils Relative: 1 %
HCT: 35.1 % (ref 35.0–45.0)
Hemoglobin: 11.5 g/dL — ABNORMAL LOW (ref 11.7–15.5)
Lymphs Abs: 857 cells/uL (ref 850–3900)
MCH: 32.6 pg (ref 27.0–33.0)
MCHC: 32.8 g/dL (ref 32.0–36.0)
MCV: 99.4 fL (ref 80.0–100.0)
MPV: 10.1 fL (ref 7.5–12.5)
Monocytes Relative: 9.9 %
Neutro Abs: 3657 cells/uL (ref 1500–7800)
Neutrophils Relative %: 71.7 %
Platelets: 300 10*3/uL (ref 140–400)
RBC: 3.53 10*6/uL — ABNORMAL LOW (ref 3.80–5.10)
RDW: 12.4 % (ref 11.0–15.0)
Total Lymphocyte: 16.8 %
WBC: 5.1 10*3/uL (ref 3.8–10.8)

## 2021-04-19 NOTE — Progress Notes (Signed)
Hemoglobin is mildly decreased.  Please advise patient to take multivitamin with iron.  CMP is normal.

## 2021-04-20 ENCOUNTER — Telehealth: Payer: Self-pay | Admitting: Rheumatology

## 2021-04-20 NOTE — Telephone Encounter (Signed)
FYI: Patient calling to let you know she is having an issue with administrating Enbrel. Per patient auto injector is messing up. Patient did call the company to let them know. Patient calling to let you know they will be call you. Patient requesting a call back today. She will be available all day.

## 2021-04-21 ENCOUNTER — Telehealth: Payer: Self-pay

## 2021-04-21 NOTE — Telephone Encounter (Signed)
Authorized replacement of Enbrel Autotouch Injector device. Per [employer], if they can get in touch with patient today, they will be able to ship out today to be received by patient at home tomorrow, 04/22/21  Called patient to advise. She verbalized understanding  Knox Saliva, PharmD, MPH, BCPS Clinical Pharmacist (Rheumatology and Pulmonology)

## 2021-04-21 NOTE — Telephone Encounter (Signed)
Sharyn Lull from [employer] left a voicemail stating the patient notified the manufacturer that her Enbrel auto touch device has a low or dead battery.  The manufacturer will replace the device free of charge once we receive a new prescription.   Phone [phone] Case  #[phone] [id number]

## 2021-04-21 NOTE — Telephone Encounter (Signed)
See previous phone note by [facility].

## 2021-05-11 ENCOUNTER — Ambulatory Visit: Payer: Medicare Other | Admitting: Physician Assistant

## 2021-05-11 ENCOUNTER — Ambulatory Visit: Payer: Self-pay

## 2021-05-11 ENCOUNTER — Ambulatory Visit: Payer: Medicare Other

## 2021-05-11 ENCOUNTER — Encounter: Payer: Self-pay | Admitting: Physician Assistant

## 2021-05-11 ENCOUNTER — Other Ambulatory Visit: Payer: Self-pay

## 2021-05-11 VITALS — BP 147/69 | HR 93 | Resp 16 | Ht 63.0 in | Wt 113.0 lb

## 2021-05-11 DIAGNOSIS — M19041 Primary osteoarthritis, right hand: Secondary | ICD-10-CM | POA: Diagnosis not present

## 2021-05-11 DIAGNOSIS — M810 Age-related osteoporosis without current pathological fracture: Secondary | ICD-10-CM

## 2021-05-11 DIAGNOSIS — M25552 Pain in left hip: Secondary | ICD-10-CM

## 2021-05-11 DIAGNOSIS — M0609 Rheumatoid arthritis without rheumatoid factor, multiple sites: Secondary | ICD-10-CM

## 2021-05-11 DIAGNOSIS — Z8639 Personal history of other endocrine, nutritional and metabolic disease: Secondary | ICD-10-CM

## 2021-05-11 DIAGNOSIS — G8929 Other chronic pain: Secondary | ICD-10-CM | POA: Diagnosis not present

## 2021-05-11 DIAGNOSIS — M19071 Primary osteoarthritis, right ankle and foot: Secondary | ICD-10-CM

## 2021-05-11 DIAGNOSIS — M533 Sacrococcygeal disorders, not elsewhere classified: Secondary | ICD-10-CM

## 2021-05-11 DIAGNOSIS — Z780 Asymptomatic menopausal state: Secondary | ICD-10-CM

## 2021-05-11 DIAGNOSIS — M19072 Primary osteoarthritis, left ankle and foot: Secondary | ICD-10-CM

## 2021-05-11 DIAGNOSIS — Z111 Encounter for screening for respiratory tuberculosis: Secondary | ICD-10-CM

## 2021-05-11 DIAGNOSIS — M25512 Pain in left shoulder: Secondary | ICD-10-CM

## 2021-05-11 DIAGNOSIS — Z862 Personal history of diseases of the blood and blood-forming organs and certain disorders involving the immune mechanism: Secondary | ICD-10-CM

## 2021-05-11 DIAGNOSIS — M170 Bilateral primary osteoarthritis of knee: Secondary | ICD-10-CM

## 2021-05-11 DIAGNOSIS — R778 Other specified abnormalities of plasma proteins: Secondary | ICD-10-CM

## 2021-05-11 DIAGNOSIS — M19042 Primary osteoarthritis, left hand: Secondary | ICD-10-CM

## 2021-05-11 DIAGNOSIS — Z79899 Other long term (current) drug therapy: Secondary | ICD-10-CM

## 2021-05-11 DIAGNOSIS — D75839 Thrombocytosis, unspecified: Secondary | ICD-10-CM

## 2021-05-11 DIAGNOSIS — C320 Malignant neoplasm of glottis: Secondary | ICD-10-CM

## 2021-05-11 DIAGNOSIS — M47812 Spondylosis without myelopathy or radiculopathy, cervical region: Secondary | ICD-10-CM

## 2021-05-11 MED ORDER — PREDNISONE 5 MG PO TABS: ORAL_TABLET | ORAL | 0 refills | Status: DC

## 2021-05-11 NOTE — Progress Notes (Signed)
Office Visit Note  Patient: Jean [name]             Date of Birth: 09-23-1945           MRN: [id number]             PCP: [name], MD Referring: [name], MD Visit Date: 05/11/2021 Occupation: @GUAROCC @  Subjective:  Left hip pain  History of Present Illness: Jean [name] is a 75 y.o. female with history of seronegative rheumatoid arthritis, osteoarthritis, and osteoporosis.  She is on Arava 20 mg 1 tablet by mouth daily and Enbrel 50 mg subcutaneous once weekly. She was started on enbrel on 01/15/21. (previously on humira 40mg  every 14 days).  She is tolerating Arava and Enbrel as combination therapy.  She has not missed any doses recently.  She states that 1 week ago she started to experience significant discomfort in her left hip.  She states that the pain has progressively been worsening since Wednesday.  She has had difficulty ambulating due to the severity of pain.  She has been using a walker to assist with ambulation.  She denies any fall or injury prior to the onset of symptoms.  She denies any change in activity prior to the onset of symptoms.  She is not experiencing any numbness or tingling in her left lower extremity.  She has not noticed any muscle weakness.  She is having pain on the outside of her hip as well as in the groin and buttocks.  She denies any obvious joint swelling.  She has tried taking Tylenol for pain relief. Overall she has started to notice significant improvement on Arava and Enbrel so she is feeling discouraged.  She denies any other joint pain or inflammation at this time. She denies any recent infections.  She has not had any recent fevers.   Activities of Daily Living:  Patient reports morning stiffness for 1 hour.   Patient Reports nocturnal pain.  Difficulty dressing/grooming: Reports Difficulty climbing stairs: Reports Difficulty getting out of chair: Reports Difficulty using hands for taps, buttons, cutlery, and/or  writing: Denies  Review of Systems  Constitutional:  Positive for fatigue.  HENT:  Positive for mouth dryness.   Eyes:  Negative for dryness.  Respiratory:  Negative for shortness of breath.   Cardiovascular:  Negative for swelling in legs/feet.  Gastrointestinal:  Negative for constipation.  Endocrine: Negative for increased urination.  Genitourinary:  Negative for difficulty urinating.  Musculoskeletal:  Positive for joint pain, joint pain, joint swelling, muscle weakness, morning stiffness and muscle tenderness.  Skin:  Negative for rash.  Allergic/Immunologic: Negative for susceptible to infections.  Neurological:  Negative for numbness.  Hematological:  Negative for bruising/bleeding tendency.  Psychiatric/Behavioral:  Negative for sleep disturbance.    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   Vitamin D deficiency 01/23/2017   Rheumatoid arthritis, seronegative, multiple sites (HCC) 01/19/2017   High risk medication use 01/19/2017   Primary osteoarthritis of both hands 01/19/2017   Primary osteoarthritis of both knees 01/19/2017   Fatigue 01/19/2017  Thrombocytosis 01/19/2017   Primary osteoarthritis of both feet 01/19/2017   Deficiency anemia 01/19/2017   Screening-pulmonary TB 04/26/2016    Past Medical History:  Diagnosis Date   Arthritis    Cancer [facility])    History of radiation therapy 05/03/19- 06/25/19   Larynx 35 fractions of 2 Gy each to total 70 Gy   Hypertension    Rheumatoid arthritis (HCC)     Vocal cord mass     Family History  Problem Relation Age of Onset   Stroke Mother    Hypertension Daughter    Colon polyps Neg Hx    Colon cancer Neg Hx    Past Surgical History:  Procedure Laterality Date   ECTOPIC PREGNANCY SURGERY     EXTERNAL EAR SURGERY Right    removal of cyst or gland   IR GASTROSTOMY TUBE MOD SED  04/26/2019   IR GASTROSTOMY TUBE REMOVAL  11/05/2019   IR IMAGING GUIDED PORT INSERTION  04/26/2019   IR REMOVAL TUN ACCESS W/ PORT W/O FL MOD SED  11/05/2019   MICROLARYNGOSCOPY Left 03/26/2019   Procedure: DIRECT MICROLARYNGOSCOPY WITH BIOPSY OF VOCAL CORD MASS;  Surgeon: Newman Pies, MD;  Location: [facility];  Service: ENT;  Laterality: Left;   Social History   Social History Narrative   Patient was recently widowed in March 2020.   Patient has 1 daughter who lives in [place]   Patient moved to [place] from [place] in January 2009.   Patient is a retired [employer].   Immunization History  Administered Date(s) Administered   Fluad Quad(high Dose 65+) 05/20/2020   Influenza, High Dose Seasonal PF 06/09/2017   PFIZER(Purple Top)SARS-COV-2 Vaccination 08/29/2019, 09/19/2019, 07/11/2020     Objective: Vital Signs: BP (!) 147/69 (BP Location: Right Arm, Patient Position: Sitting, Cuff Size: Normal)   Pulse 93   Resp 16   Ht 5\' 3"  (1.6 m)   Wt 113 lb (51.3 kg)   BMI 20.02 kg/m    Physical Exam Vitals and nursing note reviewed.  Constitutional:      Appearance: She is well-developed.  HENT:     Head: Normocephalic and atraumatic.  Eyes:     Conjunctiva/sclera: Conjunctivae normal.  Pulmonary:     Effort: Pulmonary effort is normal.  Abdominal:     Palpations: Abdomen is soft.  Musculoskeletal:     Cervical back: Normal range of motion.  Skin:    General: Skin is warm and dry.     Capillary Refill: Capillary refill takes less than 2 seconds.  Neurological:     Mental Status: She is alert and oriented to person, place,  and time.  Psychiatric:        Behavior: Behavior normal.     Musculoskeletal Exam: C-spine has limited ROM with lateral rotation.  Shoulder joint and elbow joints have good ROM.  Synovitis of the right second and third PIP joints noted.  PIP and DIP thickening consistent with osteoarthritis of both hands.  Painful and limited ROM of the left hip.  Tenderness over the left trochanteric bursa and piriformis muscle.  Right knee has good range of motion with no warmth or effusion.  Left knee joint has a small effusion with no warmth.  No tenderness or swelling of calves.  Ankle joints have good ROM with no tenderness or joint swelling.   CDAI Exam: CDAI Score: 3.6  Patient Global: 3 mm; Provider Global: 3 mm Swollen: 3 ; Tender: 1  Joint Exam 05/11/2021  Right  Left  PIP 2  Swollen      PIP 3  Swollen      Hip      Tender  Knee     Swollen      Investigation: No additional findings.  Imaging: No results found.  Recent Labs: Lab Results  Component Value Date   WBC 5.1 04/17/2021   HGB 11.5 (L) 04/17/2021   PLT 300 04/17/2021   NA 140 04/17/2021   K 4.1 04/17/2021   CL 105 04/17/2021   CO2 27 04/17/2021   GLUCOSE 103 (H) 04/17/2021   BUN 13 04/17/2021   CREATININE 0.81 04/17/2021   BILITOT 0.2 04/17/2021   ALKPHOS 108 11/18/2020   AST 15 04/17/2021   ALT 12 04/17/2021   PROT 6.5 04/17/2021   ALBUMIN 2.6 (L) 11/18/2020   CALCIUM 9.6 04/17/2021   GFRAA 100 09/03/2020   QFTBGOLDPLUS NEGATIVE 08/06/2020    Speciality Comments: PPD- 07/08/17 Negative, Humira started August 06, 2020-inadequate response (last dose 12/24/20), Enbrel started 01/15/21  Procedures:  No procedures performed Allergies: Sulfa antibiotics    Assessment / Plan:     Visit Diagnoses: Rheumatoid arthritis, seronegative, multiple sites (HCC) -  (D/c MTX 8 tab/wk-elevated creatinine-1.07 on 05/09/20, GFR 60): She has ongoing synovitis in the right 2nd and 3rd PIP joints.  Small effusion in the left  knee. She presents today with severe pain in the left hip joint with no injury or fall prior to the onset of symptoms. She has groin pain and limited ROM of the left hip on examination today.  X-rays of the left hip and pelvis were updated today. Joint space narrowing was noted in the left hip. Discussed that her rheumatoid arthritis remains uncontrolled on the current regimen or not missing any doses of Enbrel or Arava.  She was initially started on Arava on 01/15/2021.  She previously had an inadequate response to Humira.  She does not want to make any medication changes at this time.  ESR and CRP were updated today for further evaluation.  She was given a prednisone taper starting at 20 mg tapering by 5 mg every 4 days.  She was advised to notify us if her symptoms persist or worsen.  We will call her with the full x-ray results once completed by Dr. Corliss Skains.  She will follow-up in the office in 4 weeks.- Plan: Sedimentation rate, C-reactive protein  High risk medication use - Arava 20 mg 1 tablet by mouth daily and Enbrel 50 mg subcutaneous once weekly.  (previously on humira 40mg  every 14 days) - Plan: CBC with Differential/Platelet, QuantiFERON-TB Gold Plus CBC and CMP updated on 04/17/21.  Next lab work will be due in January and every 3 months.  Standing orders for CBC and CMP remain in place.  TB gold negative on 07/31/20.  Future order for TB gold will be placed today.  She has not had any recent infections.  Discussed the importance of holding enbrel and arava if she develops signs or symptoms of an infection and to resume once the infection has completely cleared.   Screening for tuberculosis -Future order for TB gold placed today.  Plan: QuantiFERON-TB Gold Plus  Chronic left shoulder pain: Improved.  She has good range of motion with no discomfort on examination today.  Primary osteoarthritis of both hands: She has PIP and DIP thickening consistent with osteoarthritis of both hands.  She was  able to make a complete fist on examination today.  She still  has some synovitis in the second and third PIP joints.  Primary osteoarthritis of both knees: She has good range of motion of both knee joints on examination.  A small effusion in the left knee was noted.  No warmth or erythema noted.  Primary osteoarthritis of both feet: She is not having any increased discomfort in her feet at this time.  She has good range of motion of both ankle joints with no tenderness or synovitis.  Spondylosis without myelopathy or radiculopathy, cervical region: Chronic pain and stiffness.  She is limited range of motion without rotation.  Age-related osteoporosis without current pathological fracture: 11/02/19: DEXA showed T score of -3.4 in the lumbar region.  She is taking Fosamax 70 mg 1 tablet by mouth once weekly and vitamin D 2000 units daily.  No recent falls or fractures.  History of vitamin D deficiency: She is on vitamin D 2000 units daily.  Pain in left hip - She presents today with left hip pain which started 1 week ago.  She did not have any injury or fall prior to the onset of symptoms.  She was unable to identify a trigger and did not have any increased activity prior to the onset of symptoms.  Her discomfort has been severe since Wednesday making it difficult for her to bear full weight.  She is using a walker to assist with ambulation.  She has tried taking Tylenol to alleviate her symptoms but has had only mild relief.  On examination she has painful limited range of motion of the left hip joint.  The pain is radiating into her groin.  She also has tenderness over the left trochanteric bursa and piriformis muscle.  X-rays of the left hip and pelvis were updated today.  I will also check a sed rate and CRP.  Discussed that her symptoms could be due to underlying rheumatoid arthritis since she has active inflammation on examination today in other joints.  A prednisone taper starting at 20 mg tapering  by 5 mg every 4 days was sent to the pharmacy today.  She was advised to notify us if her symptoms persist or worsen.  Pan: XR Pelvis 1-2 Views, XR HIP UNILAT W OR W/O PELVIS 2-3 VIEWS LEFT, Sedimentation rate, C-reactive protein  Chronic left SI joint pain - She presents today with left-sided pelvic pain which started 1 week ago with no injury or change in activity prior to onset of symptoms.  On examination she has painful range of motion of the left hip radiating into the groin.  She has some tenderness over the left SI joint and piriformis muscle as well.  X-rays of the left hip and pelvis were updated today.  No obvious acute abnormality was noted when the images were reviewed. Dr. Dimple Casey looked at the images since Dr. Corliss Skains was out of the office today and agreed with me.  I will have Dr. Corliss Skains read the images once she returns to the office and we will make further recommendations at that time.  A prednisone taper starting at 20 mg tapering by 5 mg every 4 days was sent to the pharmacy.  She was advised to notify us if she develops any new or worsening symptoms.  She can continue to use the cane to assist with ambulation.  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    Dispense:  40 tablet    Refill:  0      Follow-Up Instructions: Return in about 4 weeks (around  06/08/2021) for Rheumatoid arthritis, Osteoarthritis, Osteoporosis.   Gearldine Bienenstock, PA-C  Note - This record has been created using Dragon software.  Chart creation errors have been sought, but may not always  have been located. Such creation errors do not reflect on  the standard of medical care.

## 2021-05-11 NOTE — Patient Instructions (Addendum)
Standing Labs We placed an order today for your standing lab work.   Please have your standing labs drawn in January and every 3 months   If possible, please have your labs drawn 2 weeks prior to your appointment so that the provider can discuss your results at your appointment.  Please note that you may see your imaging and lab results in MyChart before we have reviewed them. We may be awaiting multiple results to interpret others before contacting you. Please allow our office up to 72 hours to thoroughly review all of the results before contacting the office for clarification of your results.  We have open lab daily: Monday through Thursday from 1:30-4:30 PM and Friday from 1:30-4:00 PM at the office of Dr. Shaili Deveshwar, [facility].   Please be advised, all patients with office appointments requiring lab work will take precedent over walk-in lab work.  If possible, please come for your lab work on Monday and Friday afternoons, as you may experience shorter wait times. The office is located at [address], [address], ,  [address] No appointment is necessary.   Labs are drawn by [facility]. Please bring your co-pay at the time of your lab draw.  You may receive a bill from [facility] for your lab work.  If you wish to have your labs drawn at another location, please call the office 24 hours in advance to send orders.  If you have any questions regarding directions or hours of operation,  please call [phone].   As a reminder, please drink plenty of water prior to coming for your lab work. Thanks!  

## 2021-05-12 LAB — CBC WITH DIFFERENTIAL/PLATELET
Absolute Monocytes: 645 cells/uL (ref 200–950)
Basophils Absolute: 10 cells/uL (ref 0–200)
Basophils Relative: 0.2 %
Eosinophils Absolute: 42 cells/uL (ref 15–500)
Eosinophils Relative: 0.8 %
HCT: 32.1 % — ABNORMAL LOW (ref 35.0–45.0)
Hemoglobin: 10.3 g/dL — ABNORMAL LOW (ref 11.7–15.5)
Lymphs Abs: 816 cells/uL — ABNORMAL LOW (ref 850–3900)
MCH: 31.2 pg (ref 27.0–33.0)
MCHC: 32.1 g/dL (ref 32.0–36.0)
MCV: 97.3 fL (ref 80.0–100.0)
MPV: 9.3 fL (ref 7.5–12.5)
Monocytes Relative: 12.4 %
Neutro Abs: 3687 cells/uL (ref 1500–7800)
Neutrophils Relative %: 70.9 %
Platelets: 431 10*3/uL — ABNORMAL HIGH (ref 140–400)
RBC: 3.3 10*6/uL — ABNORMAL LOW (ref 3.80–5.10)
RDW: 12.2 % (ref 11.0–15.0)
Total Lymphocyte: 15.7 %
WBC: 5.2 10*3/uL (ref 3.8–10.8)

## 2021-05-12 LAB — SEDIMENTATION RATE: Sed Rate: 31 mm/h — ABNORMAL HIGH (ref 0–30)

## 2021-05-12 LAB — C-REACTIVE PROTEIN: CRP: 96.1 mg/L — ABNORMAL HIGH (ref <8.0)

## 2021-05-12 NOTE — Progress Notes (Signed)
RBC count and hgb are low.  Plt count elevated. Absolute eosinophil count is slightly low.   CRP is every elevated and ESR is borderline elevated.  This could be due to active inflammation secondary to active disease.  She was given a prednisone taper yesterday.  Please call to check on how the patient is feeling.

## 2021-05-18 NOTE — Progress Notes (Signed)
Please call the patient to review x-ray results: mild to moderate OA of the left hip.  Please see how the patient is feeling since starting on the prednisone taper.

## 2021-05-22 ENCOUNTER — Ambulatory Visit: Payer: Medicare Other | Admitting: Rheumatology

## 2021-05-25 NOTE — Progress Notes (Signed)
Office Visit Note  Patient: Jean [name]             Date of Birth: 25-Feb-1946           MRN: [id number]             PCP: [name], MD Referring: [name], MD Visit Date: 06/08/2021 Occupation: @GUAROCC @  Subjective:  Pain in multiple joints   History of Present Illness: Jean [name] is a 75 y.o. female with history of seronegative rheumatoid arthritis and osteoarthritis.  She is taking arava 20 mg 1 tablet by mouth daily and enbrel 50 mg sq injections once weekly.  She was started on Enbrel on 01/15/2021.  She has been tolerating both medications without any side effects.  She continues to have recurrent flares involving multiple joints.  She is currently having pain and swelling in both hands.  According to the patient she has been waking up in the mornings and her fingers look like sausages by 1 PM the swelling has improved.  She also has ongoing swelling in the left knee and pain in the left hip.  She took a prednisone taper after her last office visit 05/11/2021 which provided significant relief but her symptoms have returned.  She is open to discussing other treatment options to better manage her rheumatoid arthritis. She denies any recent infections. She remains on Fosamax 70 mg 1 tablet weekly and vitamin D 2000 units daily.  No recent falls or fractures.   Activities of Daily Living:  Patient reports morning stiffness for 1 hour.   Patient Denies nocturnal pain.  Difficulty dressing/grooming: Denies Difficulty climbing stairs: Reports Difficulty getting out of chair: Denies Difficulty using hands for taps, buttons, cutlery, and/or writing: Reports  Review of Systems  Constitutional:  Positive for fatigue.  HENT:  Negative for mouth sores, mouth dryness and nose dryness.   Eyes:  Negative for pain, itching and dryness.  Respiratory:  Negative for shortness of breath and difficulty breathing.   Cardiovascular:  Negative for chest pain and  palpitations.  Gastrointestinal:  Negative for blood in stool, constipation and diarrhea.  Endocrine: Negative for increased urination.  Genitourinary:  Negative for difficulty urinating.  Musculoskeletal:  Positive for joint swelling, myalgias, morning stiffness, muscle tenderness and myalgias. Negative for joint pain and joint pain.  Skin:  Negative for color change, rash and redness.  Allergic/Immunologic: Negative for susceptible to infections.  Neurological:  Positive for numbness. Negative for dizziness, headaches, memory loss and weakness.  Hematological:  Negative for bruising/bleeding tendency.  Psychiatric/Behavioral:  Negative for confusion.    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   Vitamin D deficiency 01/23/2017   Rheumatoid arthritis, seronegative, multiple sites (HCC) 01/19/2017   High risk medication use 01/19/2017   Primary osteoarthritis of both hands 01/19/2017   Primary osteoarthritis of both knees 01/19/2017   Fatigue 01/19/2017   Thrombocytosis 01/19/2017   Primary osteoarthritis of both feet 01/19/2017   Deficiency anemia 01/19/2017   Screening-pulmonary TB 04/26/2016    Past Medical History:  Diagnosis Date  Arthritis    Cancer (HCC)    History of radiation therapy 05/03/19- 06/25/19   Larynx 35 fractions of 2 Gy each to total 70 Gy   Hypertension    Rheumatoid arthritis (HCC)    Vocal cord mass     Family History  Problem Relation Age of Onset   Stroke Mother    Hypertension Daughter     Colon polyps Neg Hx    Colon cancer Neg Hx    Past Surgical History:  Procedure Laterality Date   ECTOPIC PREGNANCY SURGERY     EXTERNAL EAR SURGERY Right    removal of cyst or gland   IR GASTROSTOMY TUBE MOD SED  04/26/2019   IR GASTROSTOMY TUBE REMOVAL  11/05/2019   IR IMAGING GUIDED PORT INSERTION  04/26/2019   IR REMOVAL TUN ACCESS W/ PORT W/O FL MOD SED  11/05/2019   MICROLARYNGOSCOPY Left 03/26/2019   Procedure: DIRECT MICROLARYNGOSCOPY WITH BIOPSY OF VOCAL CORD MASS;  Surgeon: Newman Pies, MD;  Location: [facility];  Service: ENT;  Laterality: Left;   Social History   Social History Narrative   Patient was recently widowed in March 2020.   Patient has 1 daughter who lives in [place]   Patient moved to [place] from [place] in January 2009.   Patient is a retired [employer].   Immunization History  Administered Date(s) Administered   Fluad Quad(high Dose 65+) 05/20/2020   Influenza, High Dose Seasonal PF 06/09/2017   PFIZER(Purple Top)SARS-COV-2 Vaccination 08/29/2019, 09/19/2019, 07/11/2020     Objective: Vital Signs: BP (!) 154/75 (BP Location: Right Arm, Patient Position: Sitting, Cuff Size: Normal)   Pulse 94   Ht 5\' 3"  (1.6 m)   Wt 119 lb 3.2 oz (54.1 kg)   BMI 21.12 kg/m    Physical Exam Vitals and nursing note reviewed.  Constitutional:      Appearance: She is well-developed.  HENT:     Head: Normocephalic and atraumatic.  Eyes:     Conjunctiva/sclera: Conjunctivae normal.  Pulmonary:     Effort: Pulmonary effort is normal.  Abdominal:     Palpations: Abdomen is soft.  Musculoskeletal:     Cervical back: Normal range of motion.  Skin:    General: Skin is warm and dry.     Capillary Refill: Capillary refill takes less than 2 seconds.  Neurological:     Mental Status: She is alert and oriented to person, place, and time.  Psychiatric:        Behavior: Behavior normal.     Musculoskeletal Exam: C-spine has good ROM with  crepitus.  Shoulder joints have good ROM with no tenderness.  Elbow joints have good ROM.  Olecranon bursitis of both elbows.  Wrist joints have good ROM.  Tenderness and synovitis of several MCP and PIP joints.  Incomplete fist formation bilaterally.  Painful and limited ROM of the left hip.  Left knee has warmth and a small effusion.  Ankle joints have good ROM with no tenderness or swelling.   CDAI Exam: CDAI Score: 31.2  Patient Global: 6 mm; Provider Global: 6 mm Swollen: 13 ; Tender: 19  Joint Exam 06/08/2021      Right  Left  MCP 1   Tender   Tender  MCP 2  Swollen Tender   Tender  MCP 3  Swollen Tender  Swollen Tender  MCP 4     Swollen Tender  MCP 5  Swollen Tender   Tender  PIP 2  Swollen Tender  Swollen Tender  PIP 3  Swollen Tender  Swollen Tender  PIP 4  Swollen Tender  Swollen Tender  PIP 5  Swollen Tender     Sacroiliac      Tender  Hip      Tender  Knee     Swollen Tender     Investigation: No additional findings.  Imaging: XR HIP UNILAT W OR W/O PELVIS 2-3 VIEWS LEFT  Result Date: 05/18/2021 Mild to moderate left hip joint narrowing was noted.  No chondrocalcinosis was noted. Impression: These findings are consistent with mild to moderate osteoarthritis of the left hip.  XR Pelvis 1-2 Views  Result Date: 05/18/2021 Mild to moderate left hip joint narrowing was noted.  No SI joint narrowing or sclerosis was noted.  No chondrocalcinosis was noted. Impression: These findings are consistent with osteoarthritis of the left hip.   Recent Labs: Lab Results  Component Value Date   WBC 5.7 05/29/2021   HGB 10.4 (L) 05/29/2021   PLT 336 05/29/2021   NA 138 05/29/2021   K 3.6 05/29/2021   CL 107 05/29/2021   CO2 21 (L) 05/29/2021   GLUCOSE 111 (H) 05/29/2021   BUN 11 05/29/2021   CREATININE 0.81 05/29/2021   BILITOT 0.5 05/29/2021   ALKPHOS 100 05/29/2021   AST 12 (L) 05/29/2021   ALT 28 05/29/2021   PROT 6.7 05/29/2021   ALBUMIN 3.5 05/29/2021   CALCIUM  8.9 05/29/2021   GFRAA 100 09/03/2020   QFTBGOLDPLUS NEGATIVE 08/06/2020    Speciality Comments: PPD- 07/08/17 Negative, Humira started August 06, 2020-inadequate response (last dose 12/24/20), Enbrel started 01/15/21  Procedures:  No procedures performed Allergies: Sulfa antibiotics   Assessment / Plan:     Visit Diagnoses: Rheumatoid arthritis, seronegative, multiple sites (HCC) -  (D/c MTX 8 tab/wk-elevated creatinine-1.07 on 05/09/20, GFR 60): She presents today with tenderness and synovitis involving multiple joints in both hands.  A small to moderate effusion was noted in the left knee and has has painful ROM of the left hip on examination today. Her morning stiffness has been lasting until 1pm daily. She is currently on enbrel 50 mg sq injections once weekly and arava 20 mg 1 tablet daily.  She has been tolerating both medications without any side effects or injection site reactions.  She has not missed any doses recently.  She continues to have recurrent flares despite being on combination therapy.  The most recent prednisone taper was sent to the pharmacy on 05/11/21.  She previously had an inadequate response to humira.  She has been on enbrel since 01/15/21.   Different treatment options were discussed today in detail.  Indications, contraindications, and potential side effects of actemra were discussed and all questions were addressed.  Consent obtained so we will apply for actemra through her insurance.  Once Actemra has been approved she will return to the office for administration of the first injection.  She voiced understanding.  All questions were addressed.  She will remain on Arava as combination therapy. She will follow up in 2 months to assess her response.   Medication counseling:  Baseline Immunosuppressant Therapy Labs  Quantiferon TB Gold Latest Ref Rng & Units 08/06/2020  Quantiferon TB Gold Plus NEGATIVE NEGATIVE    Hepatitis Latest Ref Rng & Units 08/06/2020  Hep B  Surface Ag NON-REACTI NON-REACTIVE  Hep B IgM NON-REACTI NON-REACTIVE  Hep C Ab NON-REACTI NON-REACTIVE  Hep C Ab NON-REACTI NON-REACTIVE    No results found  for: HIV  Immunoglobulin Electrophoresis Latest Ref Rng & Units 05/29/2021  IgG 586 - 1,602 mg/dL 638  IgM 26 - 756 mg/dL 38    Serum Protein Electrophoresis Latest Ref Rng & Units 05/29/2021  Total Protein 6.5 - 8.1 g/dL 6.7  Albumin 3.8 - 4.8 g/dL -  Alpha-1 0.2 - 0.3 g/dL -  Alpha-2 0.5 - 0.9 g/dL -  Beta Globulin 0.4 - 0.6 g/dL -  Beta 2 0.2 - 0.5 g/dL -  Gamma Globulin 0.8 - 1.7 g/dL -   Lipid Panel Lab Results  Component Value Date   CHOL 223 (H) 03/17/2018   HDL 72 03/17/2018   LDLCALC 133 (H) 03/17/2018   TRIG 85 03/17/2018   CHOLHDL 3.1 03/17/2018     Chest x-ray: No acute cardiopulmonary disease on 11/30/13.   Counseled patient that Actemra is an IL-6 blocking agent.   Counseled patient on purpose, proper use, and adverse effects of Actemra.  Reviewed the most common adverse effects including infections, injection site reaction, bowel injury, and rarely cancer and conditions of the nervous system.  Reviewed that the medication should be held during infections.  Discussed that there is the possibility of an increased risk of malignancy but it is not well understood if this increased risk is due to the medication or the disease state.  Counseled patient that Actemra should be held prior to scheduled surgery.  Counseled patient to avoid live vaccines while on Actemra.  Recommend annual influenza, Pneumovax 23, Prevnar 13, and Shingrix as indicated.   Reviewed the importance of regular labs while on Actemra therapy including the need for routine lipid panel.  Advised patient to get standing labs one month after starting Actemra.  Provided patient with standing lab orders. P rovided patient with medication education material and answered all questions.  Patient voiced understanding.  Patient consented to Actemra.  Will  upload consent into the media tab.  Reviewed storage instructions of Actemra with patient.  Advised patient that initial Actemra injection must be given in the office.  Will apply for Actemra through patient's insurance.    Patient dose will be  162 mg every 14 days based on weight <100 kg .  Prescription pending lab results and/or insurance approval.  High risk medication use - Applying for actemra 162 mg sq injections every 14 days.  She will continue taking Arava 20 mg 1 tablet by mouth daily.  Inadequate response to Enbrel and Humira.  Discontinued methotrexate due to elevated creatinine. CBC and CMP updated on 05/29/21. She will be due to update lab work 1 month and every 3 months after starting on Actemra.  Standing orders for CBC and CMP are in place. TB gold negative on 08/06/20.  Future order for TB gold remains in place.  Lipid panel drawn at Dr. Annitta Jersey office on 03/02/2021: Triglycerides 111, non-HDL cholesterol 123, LDL cholesterol 102, HDL 67, total cholesterol 433.  She will require updated lipid panel after starting on actemra.   Chronic left shoulder pain: Improved.  She has good ROM with no discomfort on examination today.   Primary osteoarthritis of both hands: She has PIP and DIP thickening consistent with OA of both hands.  CMC joint thickening bilaterally.  Tenderness and synovitis of several PIP joints as described above.  Incomplete fist formation bilaterally.  Her rheumatoid arthritis remains uncontrolled.  Discussed the importance of joint protection and muscle strengthening.   Primary osteoarthritis of both knees: She has good ROM of both knee joints.  Warmth and a small effusion in the left knee noted on examination.   Primary osteoarthritis of both feet: She is not having any increased discomfort in her ankle joints or feet at this time. She has good ROM of both ankle joints with no tenderness or inflammation.    Spondylosis without myelopathy or radiculopathy, cervical  region: ROM has improved.  Crepitus noted with ROM. No symptoms of radiculopathy at this time.   Age-related osteoporosis without current pathological fracture - 11/02/19: DEXA showed T score of -3.4 in the lumbar region.  She is taking Fosamax 70 mg 1 tablet by mouth once weekly and vitamin D 2000 units daily. Vitamin D was 55.17 on 05/29/21.   History of vitamin D deficiency: Vitamin D was 55.17 on 05/29/21.   Pain in left hip: She continues to have painful and limited ROM of the left hip.    Chronic left SI joint pain: She has tenderness over the left SI joint.   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 Bertis Ruddy and ENT.   Post-menopausal  Thrombocytosis: Platelet count was [id number]-[id number] on 05/29/21.   Abnormal SPEP: Recent lab work updated by Dr. Bertis Ruddy on 05/29/21   History of anemia  Orders: No orders of the defined types were placed in this encounter.  No orders of the defined types were placed in this encounter.   Follow-Up Instructions: Return in about 2 months (around 08/09/2021) for Rheumatoid arthritis, Osteoarthritis.   Gearldine Bienenstock, PA-C  Note - This record has been created using Dragon software.  Chart creation errors have been sought, but may not always  have been located. Such creation errors do not reflect on  the standard of medical care.

## 2021-05-28 ENCOUNTER — Other Ambulatory Visit: Payer: Self-pay | Admitting: Physician Assistant

## 2021-05-28 NOTE — Telephone Encounter (Signed)
Next Visit: 06/08/2021  Last Visit: 05/11/2021  Last Fill: 02/26/2021  DX: Rheumatoid arthritis, seronegative, multiple sites   Current Dose per office note 05/11/2021: Arava 20 mg 1 tablet by mouth daily   Labs: 04/17/2021 Hemoglobin is mildly decreased.  CMP is normal.  Okay to refill Arava?

## 2021-05-29 ENCOUNTER — Other Ambulatory Visit: Payer: Self-pay | Admitting: Physician Assistant

## 2021-05-29 ENCOUNTER — Other Ambulatory Visit: Payer: Self-pay

## 2021-05-29 ENCOUNTER — Inpatient Hospital Stay: Payer: Medicare Other | Attending: Hematology and Oncology

## 2021-05-29 DIAGNOSIS — M810 Age-related osteoporosis without current pathological fracture: Secondary | ICD-10-CM

## 2021-05-29 DIAGNOSIS — E559 Vitamin D deficiency, unspecified: Secondary | ICD-10-CM | POA: Diagnosis not present

## 2021-05-29 DIAGNOSIS — D472 Monoclonal gammopathy: Secondary | ICD-10-CM | POA: Insufficient documentation

## 2021-05-29 DIAGNOSIS — I10 Essential (primary) hypertension: Secondary | ICD-10-CM

## 2021-05-29 DIAGNOSIS — D539 Nutritional anemia, unspecified: Secondary | ICD-10-CM | POA: Diagnosis not present

## 2021-05-29 LAB — CBC WITH DIFFERENTIAL/PLATELET
Abs Immature Granulocytes: 0.02 K/uL (ref 0.00–0.07)
Basophils Absolute: 0 K/uL (ref 0.0–0.1)
Basophils Relative: 0 %
Eosinophils Absolute: 0 K/uL (ref 0.0–0.5)
Eosinophils Relative: 1 %
HCT: 33.3 % — ABNORMAL LOW (ref 36.0–46.0)
Hemoglobin: 10.4 g/dL — ABNORMAL LOW (ref 12.0–15.0)
Immature Granulocytes: 0 %
Lymphocytes Relative: 11 %
Lymphs Abs: 0.6 K/uL — ABNORMAL LOW (ref 0.7–4.0)
MCH: 31.9 pg (ref 26.0–34.0)
MCHC: 31.2 g/dL (ref 30.0–36.0)
MCV: 102.1 fL — ABNORMAL HIGH (ref 80.0–100.0)
Monocytes Absolute: 0.4 K/uL (ref 0.1–1.0)
Monocytes Relative: 8 %
Neutro Abs: 4.6 K/uL (ref 1.7–7.7)
Neutrophils Relative %: 80 %
Platelets: 336 K/uL (ref 150–400)
RBC: 3.26 MIL/uL — ABNORMAL LOW (ref 3.87–5.11)
RDW: 16.5 % — ABNORMAL HIGH (ref 11.5–15.5)
WBC: 5.7 K/uL (ref 4.0–10.5)
nRBC: 0 % (ref 0.0–0.2)

## 2021-05-29 LAB — COMPREHENSIVE METABOLIC PANEL WITH GFR
ALT: 28 U/L (ref 0–44)
AST: 12 U/L — ABNORMAL LOW (ref 15–41)
Albumin: 3.5 g/dL (ref 3.5–5.0)
Alkaline Phosphatase: 100 U/L (ref 38–126)
Anion gap: 10 (ref 5–15)
BUN: 11 mg/dL (ref 8–23)
CO2: 21 mmol/L — ABNORMAL LOW (ref 22–32)
Calcium: 8.9 mg/dL (ref 8.9–10.3)
Chloride: 107 mmol/L (ref 98–111)
Creatinine, Ser: 0.81 mg/dL (ref 0.44–1.00)
GFR, Estimated: >60 mL/min
Glucose, Bld: 111 mg/dL — ABNORMAL HIGH (ref 70–99)
Potassium: 3.6 mmol/L (ref 3.5–5.1)
Sodium: 138 mmol/L (ref 135–145)
Total Bilirubin: 0.5 mg/dL (ref 0.3–1.2)
Total Protein: 6.7 g/dL (ref 6.5–8.1)

## 2021-05-29 LAB — VITAMIN B12: Vitamin B-12: 1720 pg/mL — ABNORMAL HIGH (ref 180–914)

## 2021-05-29 LAB — IRON AND TIBC
Iron: 61 ug/dL (ref 41–142)
Saturation Ratios: 23 % (ref 21–57)
TIBC: 260 ug/dL (ref 236–444)
UIBC: 199 ug/dL (ref 120–384)

## 2021-05-29 LAB — VITAMIN D 25 HYDROXY (VIT D DEFICIENCY, FRACTURES): Vit D, 25-Hydroxy: 55.17 ng/mL (ref 30–100)

## 2021-05-29 LAB — FERRITIN: Ferritin: 106 ng/mL (ref 11–307)

## 2021-05-29 LAB — SEDIMENTATION RATE: Sed Rate: 28 mm/hr — ABNORMAL HIGH (ref 0–22)

## 2021-05-29 NOTE — Telephone Encounter (Signed)
Next Visit: 06/08/2021   Last Visit: 05/11/2021   Last Fill: 03/06/2021   DX: Age-related osteoporosis without current pathological fracture   Current Dose per office note 05/11/2021: Fosamax 70 mg 1 tablet by mouth once weekly    Labs: 04/17/2021 Hemoglobin is mildly decreased.  CMP is normal.   Okay to refill Fosamax?

## 2021-06-01 LAB — MULTIPLE MYELOMA PANEL, SERUM
Albumin SerPl Elph-Mcnc: 3.4 g/dL (ref 2.9–4.4)
Albumin/Glob SerPl: 1.3 (ref 0.7–1.7)
Alpha 1: 0.3 g/dL (ref 0.0–0.4)
Alpha2 Glob SerPl Elph-Mcnc: 0.8 g/dL (ref 0.4–1.0)
B-Globulin SerPl Elph-Mcnc: 0.9 g/dL (ref 0.7–1.3)
Gamma Glob SerPl Elph-Mcnc: 0.7 g/dL (ref 0.4–1.8)
Globulin, Total: 2.7 g/dL (ref 2.2–3.9)
IgA: 133 mg/dL (ref 64–422)
IgG (Immunoglobin G), Serum: 779 mg/dL (ref 586–1602)
IgM (Immunoglobulin M), Srm: 38 mg/dL (ref 26–217)
Total Protein ELP: 6.1 g/dL (ref 6.0–8.5)

## 2021-06-01 LAB — KAPPA/LAMBDA LIGHT CHAINS
Kappa free light chain: 17.2 mg/L (ref 3.3–19.4)
Kappa, lambda light chain ratio: 1.06 (ref 0.26–1.65)
Lambda free light chains: 16.3 mg/L (ref 5.7–26.3)

## 2021-06-05 ENCOUNTER — Inpatient Hospital Stay (HOSPITAL_BASED_OUTPATIENT_CLINIC_OR_DEPARTMENT_OTHER): Payer: Medicare Other | Admitting: Hematology and Oncology

## 2021-06-05 ENCOUNTER — Encounter: Payer: Self-pay | Admitting: Hematology and Oncology

## 2021-06-05 DIAGNOSIS — C320 Malignant neoplasm of glottis: Secondary | ICD-10-CM

## 2021-06-05 DIAGNOSIS — D539 Nutritional anemia, unspecified: Secondary | ICD-10-CM | POA: Diagnosis not present

## 2021-06-05 DIAGNOSIS — D472 Monoclonal gammopathy: Secondary | ICD-10-CM | POA: Diagnosis not present

## 2021-06-05 NOTE — Assessment & Plan Note (Signed)
I have repeated her myeloma panel several times and did not detect any abnormalities She does not need long-term follow-up

## 2021-06-05 NOTE — Assessment & Plan Note (Signed)
She has no signs or symptoms of cancer recurrence We discussed the importance of ENT follow-up for surveillance Her TSH is within normal limit

## 2021-06-05 NOTE — Assessment & Plan Note (Signed)
She has mild anemia, multifactorial, likely due to anemia of chronic illness from her rheumatoid arthritis Recent repeat iron studies and other blood work was adequate She does not need long-term follow-up

## 2021-06-05 NOTE — Progress Notes (Signed)
HEMATOLOGY-ONCOLOGY ELECTRONIC VISIT PROGRESS NOTE  Patient Care Team: Charolette Forward, MD as PCP - General (Cardiology) Charolette Forward, MD as Referring Physician (Cardiology) Eppie Gibson, MD as Attending Physician (Radiation Oncology) Leota Sauers, RN (Inactive) as Oncology Nurse Navigator Malmfelt, Stephani Police, RN as Oncology Nurse Navigator (Oncology)  I connected with the patient via telephone conference and verified that I am speaking with the correct person using two identifiers. The patient's location is at home and I am providing care from the [facility] I discussed the limitations, risks, security and privacy concerns of performing an evaluation and management service by e-visits and the availability of in person appointments.  I also discussed with the patient that there may be a patient responsible charge related to this service. The patient expressed understanding and agreed to proceed.   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) She has no signs or symptoms of cancer recurrence We discussed the importance of ENT follow-up for surveillance Her TSH is within normal limit  MGUS (monoclonal gammopathy of unknown significance) I have repeated her myeloma panel several times and did not detect any abnormalities She does not need long-term follow-up  Deficiency anemia She has mild anemia, multifactorial, likely due to anemia of chronic illness from her rheumatoid arthritis Recent repeat iron studies and other blood work was adequate She does not need long-term follow-up  No orders of the defined types were placed in this encounter.   INTERVAL HISTORY: Please see below for problem oriented charting. The purpose of today's discussion is to review test results.  She had history of vocal cord Cancer status post radiation treatment She had rheumatoid arthritis and was found to have abnormal myeloma panel which has since become normal She  complained of mild fatigue She was recently treated with a prednisone taper course to help improve her joint pain  SUMMARY OF ONCOLOGIC HISTORY: Oncology History  Squamous cell carcinoma of left vocal cord ([place])  03/26/2019 Pathology Results   DIAGNOSIS:   A. VOCAL CORD MASS, LEFT, EXCISION:  - Squamous cell carcinoma.  - See comment.    04/06/2019 Initial Diagnosis   Squamous cell carcinoma of left vocal cord ([place])   04/10/2019 Imaging   PET: 1. Left glottic lesion is hypermetabolic with maximum SUV 8.9, compatible with malignancy. No discrete adenopathy in the neck. 2. Extensive periarticular activity especially around the large joints, likely related to rheumatoid arthritis. 3. Small but hypermetabolic axillary and left external iliac lymph nodes are most likely reactive to the patient's rheumatoid arthritis, but merit surveillance. 4. Mild right hydronephrosis without hydroureter, query right UPJ narrowing. Consider follow renal sonography. 5. Prominent but not hypermetabolic left adnexa, possibly due to prominent ovary or left eccentricity of the uterus, correlate with surgical history. This could be further investigated with pelvic sonography if clinically warranted. 6. Other imaging findings of potential clinical significance: Chronic left maxillary sinusitis. Aortic Atherosclerosis (ICD10-I70.0). Coronary atherosclerosis. Emphysema (ICD10-J43.9). Sigmoid colon diverticulosis.   04/10/2019 Imaging   CT neck w/ contrast:  1. Left glottic mass extending to but not beyond the anterior commissure. There is extension into the left paraglottic fat without cartilage erosion. 2. No adenopathy. 3. Aortic Atherosclerosis (ICD10-I70.0) and Emphysema (ICD10-J43.9).   04/11/2019 Cancer Staging   Staging form: Larynx - Glottis, AJCC 8th Edition - Clinical stage from 04/11/2019: Stage III (cT3, cN0, cM0) - Signed by Eppie Gibson, MD on 04/11/2019    05/03/2019 - 06/29/2019 Chemotherapy    The patient  had cisplatin for chemotherapy treatment.     10/08/2019 PET scan   1. Complete metabolic response to therapy of left-sided glottic primary. 2. Suboptimal evaluation for cervical nodal disease secondary to extensive hypermetabolic "brown" fat within the neck and chest. Given this limitation, no evidence of metastatic disease. 3. Decreased size of hypermetabolic mediastinal node, likely reactive. 4. Aortic atherosclerosis (ICD10-I70.0), coronary artery atherosclerosis and emphysema (ICD10-J43.9). 5. Similar appearance of the right kidney, possibly related to chronic ureteropelvic junction obstruction versus pyelonephritis. 6. Left nephrolithiasis.   11/05/2019 Procedure   Successful right IJ vein Port-A-Cath explantation.   Successful removal of percutaneous gastrostomy tube     REVIEW OF SYSTEMS:   Constitutional: Denies fevers, chills or abnormal weight loss Eyes: Denies blurriness of vision Ears, nose, mouth, throat, and face: Denies mucositis or sore throat Respiratory: Denies cough, dyspnea or wheezes Cardiovascular: Denies palpitation, chest discomfort Gastrointestinal:  Denies nausea, heartburn or change in bowel habits Skin: Denies abnormal skin rashes Lymphatics: Denies new lymphadenopathy or easy bruising Neurological:Denies numbness, tingling or new weaknesses Behavioral/Psych: Mood is stable, no new changes  Extremities: No lower extremity edema All other systems were reviewed with the patient and are negative.  I have reviewed the past medical history, past surgical history, social history and family history with the patient and they are unchanged from previous note.  ALLERGIES:  is allergic to sulfa antibiotics.  MEDICATIONS:  Current Outpatient Medications  Medication Sig Dispense Refill   alendronate (FOSAMAX) 70 MG tablet TAKE 1 TABLET(70 MG) BY MOUTH 1 TIME A WEEK WITH A FULL GLASS OF WATER AND ON AN EMPTY STOMACH 12 tablet 0   Cholecalciferol  (VITAMIN D) 50 MCG (2000 UT) CAPS Take 2,000 Units by mouth daily.      Docusate Calcium (STOOL SOFTENER PO) Take by mouth.     Etanercept (ENBREL MINI) 50 MG/ML SOCT Inject 50 mg into the skin once a week. 12 mL 0   famotidine (PEPCID) 20 MG tablet Take 1 tablet (20 mg total) by mouth 2 (two) times daily as needed for heartburn or indigestion. (Patient not taking: Reported on 05/11/2021) 90 tablet 3   folic acid (FOLVITE) 1 MG tablet Take 2 mg by mouth daily.     leflunomide (ARAVA) 20 MG tablet TAKE 1 TABLET(20 MG) BY MOUTH DAILY 90 tablet 0   losartan (COZAAR) 100 MG tablet Take 100 mg by mouth daily.   0   metoprolol succinate (TOPROL-XL) 50 MG 24 hr tablet Take 50 mg by mouth daily.     omeprazole (PRILOSEC) 40 MG capsule Take 1 capsule (40 mg total) by mouth daily. 90 capsule 3   TURMERIC CURCUMIN PO Take 1,500 mg by mouth 3 (three) times daily. With bioperine     vitamin B-12 (CYANOCOBALAMIN) 1000 MCG tablet Take 1,000 mcg by mouth daily.     No current facility-administered medications for this visit.    PHYSICAL EXAMINATION: ECOG PERFORMANCE STATUS: 1 - Symptomatic but completely ambulatory  LABORATORY DATA:  I have reviewed the data as listed CMP Latest Ref Rng & Units 05/29/2021 04/17/2021 02/13/2021  Glucose 70 - 99 mg/dL 111(H) 103(H) 90  BUN 8 - 23 mg/dL 11 13 8   Creatinine 0.44 - 1.00 mg/dL 0.81 0.81 0.73  Sodium 135 - 145 mmol/L 138 140 140  Potassium 3.5 - 5.1 mmol/L 3.6 4.1 4.1  Chloride 98 - 111 mmol/L 107 105 107  CO2 22 - 32 mmol/L 21(L) 27 25  Calcium 8.9 - 10.3 mg/dL 8.9 9.6  9.1  Total Protein 6.5 - 8.1 g/dL 6.7 6.5 6.6  Total Bilirubin 0.3 - 1.2 mg/dL 0.5 0.2 0.2  Alkaline Phos 38 - 126 U/L 100 - -  AST 15 - 41 U/L 12(L) 15 17  ALT 0 - 44 U/L 28 12 15     Lab Results  Component Value Date   WBC 5.7 05/29/2021   HGB 10.4 (L) 05/29/2021   HCT 33.3 (L) 05/29/2021   MCV 102.1 (H) 05/29/2021   PLT 336 05/29/2021   NEUTROABS 4.6 05/29/2021      RADIOGRAPHIC STUDIES: I have personally reviewed the radiological images as listed and agreed with the findings in the report. XR HIP UNILAT W OR W/O PELVIS 2-3 VIEWS LEFT  Result Date: 05/18/2021 Mild to moderate left hip joint narrowing was noted.  No chondrocalcinosis was noted. Impression: These findings are consistent with mild to moderate osteoarthritis of the left hip.  XR Pelvis 1-2 Views  Result Date: 05/18/2021 Mild to moderate left hip joint narrowing was noted.  No SI joint narrowing or sclerosis was noted.  No chondrocalcinosis was noted. Impression: These findings are consistent with osteoarthritis of the left hip.   I discussed the assessment and treatment plan with the patient. The patient was provided an opportunity to ask questions and all were answered. The patient agreed with the plan and demonstrated an understanding of the instructions. The patient was advised to call back or seek an in-person evaluation if the symptoms worsen or if the condition fails to improve as anticipated.    I spent 20 minutes for the appointment reviewing test results, discuss management and coordination of care.  [name], MD 06/05/2021 5:54 PM

## 2021-06-08 ENCOUNTER — Other Ambulatory Visit: Payer: Self-pay

## 2021-06-08 ENCOUNTER — Other Ambulatory Visit (HOSPITAL_COMMUNITY): Payer: Self-pay

## 2021-06-08 ENCOUNTER — Encounter: Payer: Self-pay | Admitting: Physician Assistant

## 2021-06-08 ENCOUNTER — Ambulatory Visit: Payer: Medicare Other | Admitting: Physician Assistant

## 2021-06-08 ENCOUNTER — Telehealth: Payer: Self-pay | Admitting: Pharmacist

## 2021-06-08 VITALS — BP 154/75 | HR 94 | Ht 63.0 in | Wt 119.2 lb

## 2021-06-08 DIAGNOSIS — M47812 Spondylosis without myelopathy or radiculopathy, cervical region: Secondary | ICD-10-CM

## 2021-06-08 DIAGNOSIS — M19071 Primary osteoarthritis, right ankle and foot: Secondary | ICD-10-CM

## 2021-06-08 DIAGNOSIS — M25512 Pain in left shoulder: Secondary | ICD-10-CM | POA: Diagnosis not present

## 2021-06-08 DIAGNOSIS — M19041 Primary osteoarthritis, right hand: Secondary | ICD-10-CM

## 2021-06-08 DIAGNOSIS — Z8639 Personal history of other endocrine, nutritional and metabolic disease: Secondary | ICD-10-CM

## 2021-06-08 DIAGNOSIS — M19072 Primary osteoarthritis, left ankle and foot: Secondary | ICD-10-CM

## 2021-06-08 DIAGNOSIS — Z79899 Other long term (current) drug therapy: Secondary | ICD-10-CM

## 2021-06-08 DIAGNOSIS — M533 Sacrococcygeal disorders, not elsewhere classified: Secondary | ICD-10-CM

## 2021-06-08 DIAGNOSIS — Z780 Asymptomatic menopausal state: Secondary | ICD-10-CM

## 2021-06-08 DIAGNOSIS — M19042 Primary osteoarthritis, left hand: Secondary | ICD-10-CM

## 2021-06-08 DIAGNOSIS — M25552 Pain in left hip: Secondary | ICD-10-CM

## 2021-06-08 DIAGNOSIS — G8929 Other chronic pain: Secondary | ICD-10-CM

## 2021-06-08 DIAGNOSIS — D75839 Thrombocytosis, unspecified: Secondary | ICD-10-CM

## 2021-06-08 DIAGNOSIS — M0609 Rheumatoid arthritis without rheumatoid factor, multiple sites: Secondary | ICD-10-CM

## 2021-06-08 DIAGNOSIS — M170 Bilateral primary osteoarthritis of knee: Secondary | ICD-10-CM

## 2021-06-08 DIAGNOSIS — R778 Other specified abnormalities of plasma proteins: Secondary | ICD-10-CM

## 2021-06-08 DIAGNOSIS — C320 Malignant neoplasm of glottis: Secondary | ICD-10-CM

## 2021-06-08 DIAGNOSIS — M810 Age-related osteoporosis without current pathological fracture: Secondary | ICD-10-CM

## 2021-06-08 DIAGNOSIS — Z862 Personal history of diseases of the blood and blood-forming organs and certain disorders involving the immune mechanism: Secondary | ICD-10-CM

## 2021-06-08 NOTE — Patient Instructions (Addendum)
Standing Labs We placed an order today for your standing lab work.   Please have your standing labs drawn in March and every 3 months   TB gold due in February   If possible, please have your labs drawn 2 weeks prior to your appointment so that the provider can discuss your results at your appointment.  Please note that you may see your imaging and lab results in [facility] before we have reviewed them. We may be awaiting multiple results to interpret others before contacting you. Please allow our office up to 72 hours to thoroughly review all of the results before contacting the office for clarification of your results.  We have open lab daily: Monday through Thursday from 1:30-4:30 PM and Friday from 1:30-4:00 PM at the office of Dr. Bo Merino, [facility].   Please be advised, all patients with office appointments requiring lab work will take precedent over walk-in lab work.  If possible, please come for your lab work on Monday and Friday afternoons, as you may experience shorter wait times. The office is located at [address], [place], [place], [place] [address] No appointment is necessary.   Labs are drawn by [facility]. Please bring your co-pay at the time of your lab draw.  You may receive a bill from [facility] for your lab work.  If you wish to have your labs drawn at another location, please call the office 24 hours in advance to send orders.  If you have any questions regarding directions or hours of operation,  please call [phone].   As a reminder, please drink plenty of water prior to coming for your lab work. Thanks!  Tocilizumab Injection What is this medication? TOCILIZUMAB (TOE si LIZ ue mab) is used to treat rheumatoid arthritis and juvenile idiopathic arthritis. It may also be used to treat giant cell arteritis, cytokine release syndrome, and to slow a decrease in lung function for patients with systemic sclerosis-related lung disease. This medicine  may be used for other purposes; ask your health care provider or pharmacist if you have questions. COMMON BRAND NAME(S): Actemra What should I tell my care team before I take this medication? They need to know if you have any of these conditions: cancer diabetes (high blood sugar) heart disease hepatitis B or history of hepatitis B infection high blood pressure high cholesterol immune system problems infection especially a viral infection such as chickenpox, cold sores, or herpes infection such as tuberculosis (TB) or other bacterial, fungal or viral infections liver disease low blood counts, like low white cell, platelet, or red cell counts multiple sclerosis recent or upcoming vaccine stomach or intestine problems stroke an unusual or allergic reaction to tocilizumab, other medicines, foods, dyes, or preservatives pregnant or trying to get pregnant breast-feeding How should I use this medication? This medicine is injected into a vein or under the skin. It is usually given by a health care provider in a hospital or clinic setting. If you get this medicine at home, you will be taught how to prepare and give it. Use exactly as directed. Take it as directed on the prescription label at the same time every day. Keep taking it unless your health care provider tells you to stop. If you use a pen, be sure to take off the outer needle cover before using the dose. It is important that you put your used needles and syringes in a special sharps container. Do not put them in a trash can. If you do  not have a sharps container, call your pharmacist or health care provider to get one. A special MedGuide will be given to you before each treatment or by the pharmacist with each prescription and refill. Be sure to read this information carefully each time. Talk to your health care provider regarding the use of this medicine in children. While the drug may be prescribed for children as young as 2 years for  selected conditions, precautions do apply. Overdosage: If you think you have taken too much of this medicine contact a poison control center or emergency room at once. NOTE: This medicine is only for you. Do not share this medicine with others. What if I miss a dose? It is important not to miss your dose. Call your health care provider if you are unable to keep an appointment. If you give yourself the medicine at home and you miss a dose, take it as soon as you can. If it is almost time for your next dose, take only that dose. Do not take double or extra doses. Call your health care provider with questions. What may interact with this medication? Do not take this medicine with any of the following medications: live virus vaccines This medicine may also interact with the following medications: biologic medicines such as abatacept, adalimumab, anakinra, certolizumab, etanercept, golimumab, infliximab, rituximab, secukinumab, ustekinumab birth control pills certain medicines for cholesterol like atorvastatin, lovastatin, and simvastatin cyclosporine omeprazole steroid medicines like prednisone or cortisone theophylline vaccines warfarin This list may not describe all possible interactions. Give your health care provider a list of all the medicines, herbs, non-prescription drugs, or dietary supplements you use. Also tell them if you smoke, drink alcohol, or use illegal drugs. Some items may interact with your medicine. What should I watch for while using this medication? Your condition will be monitored carefully while you are receiving this medicine. Tell your health care provider if your symptoms do not start to get better or if they get worse. You may need blood work done while you are taking this medicine. You will be tested for tuberculosis (TB) before you start this medicine. If your doctor prescribes any medicine for TB, you should start taking the TB medicine before starting this medicine.  Make sure to finish the full course of TB medicine. This medicine may increase your risk of getting an infection. Call your health care provider for advice if you get a fever, chills, sore throat, or other symptoms of a cold or flu. Do not treat yourself. Try to avoid being around people who are sick. Talk to your health care provider about your risk of cancer. You may be more at risk for certain types of cancers if you take this medicine. What side effects may I notice from receiving this medication? Side effects that you should report to your doctor or health care professional as soon as possible: allergic reactions (skin rash, itching or hives; swelling of the face, lips, or tongue) changes in vision increase in blood pressure infection (fever, chills, cough, sore throat, pain or trouble passing urine) light-colored stool liver injury (dark yellow or brown urine; general ill feeling or flu-like symptoms; loss of appetite, right upper belly pain; unusually weak or tired; yellowing of the eyes or skin) low red blood cell counts (trouble breathing; feeling faint; lightheaded, falls; unusually weak or tired) pain, tingling, numbness in the hands or feet tears in the stomach or intestines (fever; stomach pain; sudden change in bowel habits) trouble breathing unusual bruising or  bleeding Side effects that usually do not require medical attention (report to your doctor or health care professional if they continue or are bothersome): dizziness headache pain, redness, or irritation at site where injected This list may not describe all possible side effects. Call your doctor for medical advice about side effects. You may report side effects to FDA at 1-800-FDA-1088. Where should I keep my medication? Keep out of the reach of children and pets. If you are using this medicine at home, you will be instructed on how to store this medicine. Throw away any unused medicine after the expiration date on the  label. To get rid of medicines that are no longer needed or have expired: Take the medicine to a medicine take-back program. Check with your pharmacy or law enforcement to find a location. If you cannot return the medicine, ask your pharmacist or health care provider how to get rid of this medicine safely. NOTE: This sheet is a summary. It may not cover all possible information. If you have questions about this medicine, talk to your doctor, pharmacist, or health care provider.  2022 Elsevier/Gold Standard (2019-10-10 00:00:00)

## 2021-06-08 NOTE — Telephone Encounter (Signed)
Please start Actemra Actpen BIV.  Dose: 162 mg SQ every 14 days  Dx: Rheumatoid arthritis (M05.9)  Previously tried therapies: Enbrel - inadequate clinical response Humira - inadequate clinical response  Patient and provider signed [employer] PAP forms today at [facility]. Will place in "PAP pending info" folder in pharmacy office.  Knox Saliva, PharmD, MPH, BCPS Clinical Pharmacist (Rheumatology and Pulmonology)

## 2021-06-08 NOTE — Progress Notes (Signed)
Pharmacy Note Subjective: Patient presents today to the [facility] for follow up office visit. Patient seen by the pharmacist for counseling on Actemra for rheumatoid arthritis. Patient has previously taking Humira, switched to Enbrel on 01/15/21  History of hyperlipidemia: No  History of diverticulitis: No  Objective: CBC    Component Value Date/Time   WBC 5.7 05/29/2021 0818   RBC 3.26 (L) 05/29/2021 0818   HGB 10.4 (L) 05/29/2021 0818   HGB 10.6 (L) 10/10/2019 1402   HGB 11.5 (L) 07/31/2014 0923   HCT 33.3 (L) 05/29/2021 0818   HCT 36.6 07/31/2014 0923   PLT 336 05/29/2021 0818   PLT 268 10/10/2019 1402   PLT 478 (H) 07/31/2014 0923   MCV 102.1 (H) 05/29/2021 0818   MCV 96.4 07/31/2014 0923   MCH 31.9 05/29/2021 0818   MCHC 31.2 05/29/2021 0818   RDW 16.5 (H) 05/29/2021 0818   RDW 17.2 (H) 07/31/2014 0923   LYMPHSABS 0.6 (L) 05/29/2021 0818   LYMPHSABS 1.3 07/31/2014 0923   MONOABS 0.4 05/29/2021 0818   MONOABS 0.6 07/31/2014 0923   EOSABS 0.0 05/29/2021 0818   EOSABS 0.0 07/31/2014 0923   BASOSABS 0.0 05/29/2021 0818   BASOSABS 0.1 07/31/2014 0923    CMP     Component Value Date/Time   NA 138 05/29/2021 0818   NA 141 03/05/2014 1050   K 3.6 05/29/2021 0818   K 3.9 03/05/2014 1050   CL 107 05/29/2021 0818   CO2 21 (L) 05/29/2021 0818   CO2 24 03/05/2014 1050   GLUCOSE 111 (H) 05/29/2021 0818   GLUCOSE 99 03/05/2014 1050   BUN 11 05/29/2021 0818   BUN 10.6 03/05/2014 1050   CREATININE 0.81 05/29/2021 0818   CREATININE 0.81 04/17/2021 1359   CREATININE 0.8 03/05/2014 1050   CALCIUM 8.9 05/29/2021 0818   CALCIUM 8.8 03/05/2014 1050   PROT 6.7 05/29/2021 0818   PROT 6.5 03/05/2014 1050   ALBUMIN 3.5 05/29/2021 0818   ALBUMIN 3.2 (L) 03/05/2014 1050   AST 12 (L) 05/29/2021 0818   AST 13 (L) 10/10/2019 1402   AST 12 03/05/2014 1050   ALT 28 05/29/2021 0818   ALT 10 10/10/2019 1402   ALT 28 03/05/2014 1050   ALKPHOS 100 05/29/2021 0818    ALKPHOS 85 03/05/2014 1050   BILITOT 0.5 05/29/2021 0818   BILITOT <0.2 (L) 10/10/2019 1402   BILITOT 0.20 03/05/2014 1050   GFRNONAA >60 05/29/2021 0818   GFRNONAA 87 09/03/2020 1045   GFRAA 100 09/03/2020 1045     Baseline Immunosuppressant Therapy Labs  Quantiferon TB Gold Latest Ref Rng & Units 08/06/2020  Quantiferon TB Gold Plus NEGATIVE NEGATIVE    Hepatitis Latest Ref Rng & Units 08/06/2020  Hep B Surface Ag NON-REACTI NON-REACTIVE  Hep B IgM NON-REACTI NON-REACTIVE  Hep C Ab NON-REACTI NON-REACTIVE  Hep C Ab NON-REACTI NON-REACTIVE    No results found for: HIV  Immunoglobulin Electrophoresis Latest Ref Rng & Units 05/29/2021  IgG 586 - 1,602 mg/dL 779  IgM 26 - 217 mg/dL 38    Serum Protein Electrophoresis Latest Ref Rng & Units 05/29/2021  Total Protein 6.5 - 8.1 g/dL 6.7  Albumin 3.8 - 4.8 g/dL -  Alpha-1 0.2 - 0.3 g/dL -  Alpha-2 0.5 - 0.9 g/dL -  Beta Globulin 0.4 - 0.6 g/dL -  Beta 2 0.2 - 0.5 g/dL -  Gamma Globulin 0.8 - 1.7 g/dL -    No results found for: G6PDH  No results  found for: TPMT   Lipid Panel Lab Results  Component Value Date   CHOL 223 (H) 03/17/2018   HDL 72 03/17/2018   LDLCALC 133 (H) 03/17/2018   TRIG 85 03/17/2018   CHOLHDL 3.1 03/17/2018     Chest x-ray: 11/30/13 - no active cardiopulmonary disease  Assessment/Plan:  Counseled patient that Actemra is an IL-6 blocking agent.  Counseled patient on purpose, proper use, and adverse effects of Actemra.  Reviewed the most common adverse effects including infections, infusion reactions, bowel injury, and rarely cancer and conditions of the nervous system.  Reviewed risk of lipid abnormalities and elevated liver enzymes. Discussed that there is the possibility of an increased risk of malignancy but it is not well understood if this increased risk is due to the medication or the disease state. Counseled patient that Actemra should be held prior to scheduled surgery for infections or new  antibiotic use. Counseled patient that Actemra should be held prior to scheduled surgery. Recommend annual influenza, PCV 15 or PCV20 or Pneumovax 23, and Shingrix as indicated.  Reviewed the importance of regular labs while on Actemra therapy including the need for routine lipid panel.  Will recheck lipid panel after 4-8 weeks of starting Actemra, then every 6 months routinely. CBC and CMP will be monitored every 3 months. TB gold will be monitored annually. Standing orders placed.  Provided patient with medication education material and answered all questions.  Patient voiced understanding.  Patient consented to Actemra.  Will upload consent into the media tab.  Reviewed storage instructions of Actemra with patient.  Advised patient that initial Actemra injection must be given in the office.  Will apply for Actemra through patient's insurance for self-administration using auto-injector/prefilled syringe.  Patient dose will be  162 mg every 14 days based on weight <100 kg .  Prescription pending lab results and/or insurance approval.  [employer] PAP paperwork completed today  Dr. Zenia Resides office will fax over lipid panel that was drawn in September 2022  Knox Saliva, PharmD, MPH, BCPS Clinical Pharmacist (Rheumatology and Pulmonology)

## 2021-06-08 NOTE — Telephone Encounter (Signed)
Submitted a Prior Authorization request to [facility] for ACTEMRA via CoverMyMeds. Will update once we receive a response.   Key: [id number]

## 2021-06-09 ENCOUNTER — Other Ambulatory Visit (HOSPITAL_COMMUNITY): Payer: Self-pay

## 2021-06-09 NOTE — Telephone Encounter (Signed)
Received notification from [facility] regarding a prior authorization for Jean [name]. Authorization has been APPROVED from 06/08/2021 to 06/20/2022. Approval letter sent to scan center.  Per test claim, PA has not adjudicated at pharmacy level OR pt is locked in to fill with another pharmacy (possibly [facility]).  Authorization # [id number]   Will continue to f/u.

## 2021-06-10 NOTE — Telephone Encounter (Signed)
Submitted Patient Assistance Application to [place] for [place] along with provider portion, patient portion, insurance card copy, med list, and PA. Will update patient when we receive a response.  Fax# [phone] Phone# [phone]  Knox Saliva, PharmD, MPH, BCPS Clinical Pharmacist (Rheumatology and Pulmonology)

## 2021-06-16 NOTE — Telephone Encounter (Signed)
[place] for update on patient's Actemra application  Phone# [phone]  Received a verbal confirmation from  [place] regarding an approval for [employer] patient assistance from 06/16/21 until there is change in insurance, financial status. They will fax approval letter to clinic (confirmed fax number).  Phone number: [phone]   Called patient to notify that she should pick up all 800 calls since [place] will need to complete welcome call. Advised her to try to schedule shipment to arrive to her home by 07/08/21.  Scheduled for Actemra new start visit on 06/24/21. She will plan to take Enbrel tomorrow as scheduled and hold upcoming week  Knox Saliva, PharmD, MPH, BCPS Clinical Pharmacist (Rheumatology and Pulmonology)

## 2021-06-24 ENCOUNTER — Ambulatory Visit (INDEPENDENT_AMBULATORY_CARE_PROVIDER_SITE_OTHER): Payer: Medicare Other | Admitting: Pharmacist

## 2021-06-24 ENCOUNTER — Other Ambulatory Visit: Payer: Self-pay

## 2021-06-24 DIAGNOSIS — Z79899 Other long term (current) drug therapy: Secondary | ICD-10-CM | POA: Diagnosis not present

## 2021-06-24 DIAGNOSIS — M0609 Rheumatoid arthritis without rheumatoid factor, multiple sites: Secondary | ICD-10-CM | POA: Diagnosis not present

## 2021-06-24 DIAGNOSIS — Z7189 Other specified counseling: Secondary | ICD-10-CM

## 2021-06-24 MED ORDER — ACTEMRA ACTPEN 162 MG/0.9ML ~~LOC~~ SOAJ: 162.0000 mg | SUBCUTANEOUS | 0 refills | Status: DC

## 2021-06-24 NOTE — Progress Notes (Signed)
Pharmacy Note  Subjective:   Patient presents to clinic today to receive first dose of Actemra. Her last dose of Enbrel was on 06/17/21 - stopped due to inadequate response. Previously took Humira to which she had an inadequate response. She also takes leflunomide 20mg  daily.  Patient running a fever or have signs/symptoms of infection? No  Patient currently on antibiotics for the treatment of infection? No  Patient have any upcoming invasive procedures/surgeries? No  Objective: CMP     Component Value Date/Time   NA 138 05/29/2021 0818   NA 141 03/05/2014 1050   K 3.6 05/29/2021 0818   K 3.9 03/05/2014 1050   CL 107 05/29/2021 0818   CO2 21 (L) 05/29/2021 0818   CO2 24 03/05/2014 1050   GLUCOSE 111 (H) 05/29/2021 0818   GLUCOSE 99 03/05/2014 1050   BUN 11 05/29/2021 0818   BUN 10.6 03/05/2014 1050   CREATININE 0.81 05/29/2021 0818   CREATININE 0.81 04/17/2021 1359   CREATININE 0.8 03/05/2014 1050   CALCIUM 8.9 05/29/2021 0818   CALCIUM 8.8 03/05/2014 1050   PROT 6.7 05/29/2021 0818   PROT 6.5 03/05/2014 1050   ALBUMIN 3.5 05/29/2021 0818   ALBUMIN 3.2 (L) 03/05/2014 1050   AST 12 (L) 05/29/2021 0818   AST 13 (L) 10/10/2019 1402   AST 12 03/05/2014 1050   ALT 28 05/29/2021 0818   ALT 10 10/10/2019 1402   ALT 28 03/05/2014 1050   ALKPHOS 100 05/29/2021 0818   ALKPHOS 85 03/05/2014 1050   BILITOT 0.5 05/29/2021 0818   BILITOT <0.2 (L) 10/10/2019 1402   BILITOT 0.20 03/05/2014 1050   GFRNONAA >60 05/29/2021 0818   GFRNONAA 87 09/03/2020 1045   GFRAA 100 09/03/2020 1045    CBC    Component Value Date/Time   WBC 5.7 05/29/2021 0818   RBC 3.26 (L) 05/29/2021 0818   HGB 10.4 (L) 05/29/2021 0818   HGB 10.6 (L) 10/10/2019 1402   HGB 11.5 (L) 07/31/2014 0923   HCT 33.3 (L) 05/29/2021 0818   HCT 36.6 07/31/2014 0923   PLT 336 05/29/2021 0818   PLT 268 10/10/2019 1402   PLT 478 (H) 07/31/2014 0923   MCV 102.1 (H) 05/29/2021 0818   MCV 96.4 07/31/2014 0923   MCH  31.9 05/29/2021 0818   MCHC 31.2 05/29/2021 0818   RDW 16.5 (H) 05/29/2021 0818   RDW 17.2 (H) 07/31/2014 0923   LYMPHSABS 0.6 (L) 05/29/2021 0818   LYMPHSABS 1.3 07/31/2014 0923   MONOABS 0.4 05/29/2021 0818   MONOABS 0.6 07/31/2014 0923   EOSABS 0.0 05/29/2021 0818   EOSABS 0.0 07/31/2014 0923   BASOSABS 0.0 05/29/2021 0818   BASOSABS 0.1 07/31/2014 0923    Baseline Immunosuppressant Therapy Labs TB GOLD Quantiferon TB Gold Latest Ref Rng & Units 08/06/2020  Quantiferon TB Gold Plus NEGATIVE NEGATIVE   Hepatitis Panel Hepatitis Latest Ref Rng & Units 08/06/2020  Hep B Surface Ag NON-REACTI NON-REACTIVE  Hep B IgM NON-REACTI NON-REACTIVE  Hep C Ab NON-REACTI NON-REACTIVE  Hep C Ab NON-REACTI NON-REACTIVE   HIV No results found for: HIV Immunoglobulins Immunoglobulin Electrophoresis Latest Ref Rng & Units 05/29/2021  IgG 586 - 1,602 mg/dL 779  IgM 26 - 217 mg/dL 38   SPEP Serum Protein Electrophoresis Latest Ref Rng & Units 05/29/2021  Total Protein 6.5 - 8.1 g/dL 6.7  Albumin 3.8 - 4.8 g/dL -  Alpha-1 0.2 - 0.3 g/dL -  Alpha-2 0.5 - 0.9 g/dL -  Beta Globulin 0.4 -  0.6 g/dL -  Beta 2 0.2 - 0.5 g/dL -  Gamma Globulin 0.8 - 1.7 g/dL -     Chest x-ray: 11/30/13 - no acute cardiopulmonary disease.   Assessment/Plan:  Demonstrated proper injection technique with Actemra demo device  Patient able to demonstrate proper injection technique using the teach back method.  Provider injected in the right abdomen with:  Sample Medication: Actemra PFS 162mg /0.30mL NDC: [address]-23 Lot: [id number] Expiration: 03/2022  Patient tolerated well.  Observed for 30 mins in office for adverse reaction and nonenoted.   Patient is to return in 1 month for labs and 6-8 weeks for follow-up appointment.  Standing orders placed.   Actemra approved through patient assistance .   Rx sent to: [place] ([place]) for Actemra: [phone].  Patient provided with pharmacy phone number and  advised to call ASAP to complete welcome call with [facility] and schedule shipment to home from [facility].  She will continue Actemra SQ 162mg  every 14 days. She will continue leflunomide 20mg  once daily.  She will stop Enbrel (last dose was 06/17/21)  All questions encouraged and answered.  Instructed patient to call with any further questions or concerns.  Knox Saliva, PharmD, MPH, BCPS Clinical Pharmacist (Rheumatology and Pulmonology)  06/24/2021 9:42 AM

## 2021-06-24 NOTE — Patient Instructions (Addendum)
Your next ACTEMRA dose is due on 07/08/21, 07/22/21, and every 14 days thereafter  STOP ENBREL. CONTINUE leflunomide ([place]) as prescribed.  HOLD ACTEMRA if you have signs or symptoms of an infection. You can resume once you feel better or back to your baseline. HOLD ACTEMRA if you start antibiotics to treat an infection. HOLD ACTEMRA around the time of surgery/procedures. Your surgeon will be able to provide recommendations on when to hold BEFORE and when you are cleared to [place].  Pharmacy information: Call [facility] (manufacturer) first to complete welcome call: [phone] THEN, call pharmacy. Your prescription will be shipped from [place] [facility]. Their phone number is [phone] Please call to schedule shipment and confirm address. They will mail your medication to your home.  Labs are due in 1 month then every 3 months. Lab hours are from Monday to Thursday 1:30-4:30pm and Friday 1:30-4pm. You do not need an appointment if you come for labs during these times.  How to manage an injection site reaction: Remember the 5 C's: COUNTER - leave on the counter at least 30 minutes but up to overnight to bring medication to room temperature. This may help prevent stinging COLD - place something cold (like an ice gel pack or cold water bottle) on the injection site just before cleansing with alcohol. This may help reduce pain CLARITIN - use Claritin (generic name is loratadine) for the first two weeks of treatment or the day of, the day before, and the day after injecting. This will help to minimize injection site reactions CORTISONE CREAM - apply if injection site is irritated and itching CALL ME - if injection site reaction is bigger than the size of your fist, looks infected, blisters, or if you develop hives

## 2021-07-17 ENCOUNTER — Other Ambulatory Visit: Payer: Self-pay | Admitting: Rheumatology

## 2021-07-28 NOTE — Progress Notes (Signed)
Office Visit Note  Patient: Jean [name]             Date of Birth: 1946-01-31           MRN: [id number]             PCP: [name], MD Referring: [name], MD Visit Date: 08/11/2021 Occupation: @GUAROCC @  Subjective:  Medication monitoring   History of Present Illness: Jean [name] is a 76 y.o. female with history of seronegative rheumatoid arthritis, osteoarthritis, and osteoporosis.  She is on Actemra 162 mg sq injections every 14 days and Arava 20 mg 1 tablet daily.  She was initiated on Actemra on 06/24/2021.  She is tolerating Actemra without any side effects or injection site reactions.  She has not had any infections since starting on Actemra.  She has been tolerating her [place] without any side effects.  She states her appetite has improved as well as her energy level.  She has noticed about a 90 to 95% improvement in her joint pain and inflammation since adding on Actemra as combination therapy.  She denies any nocturnal pain.  Her morning stiffness has been lasting less than 1 hour.  Her range of motion and ability have improved.  She continues to have some tenderness and swelling in her hands and knee joints but overall her symptoms have improved.  She is also having some discomfort in the left hip joint.   Activities of Daily Living:  Patient reports morning stiffness for less than 1 hour  Patient Denies nocturnal pain.  Difficulty dressing/grooming: Denies Difficulty climbing stairs: Denies Difficulty getting out of chair: Denies Difficulty using hands for taps, buttons, cutlery, and/or writing: Denies  Review of Systems  Constitutional:  Negative for fatigue.  HENT:  Negative for mouth sores, nosebleeds, mouth dryness and nose dryness.   Eyes:  Negative for pain, visual disturbance and dryness.  Respiratory:  Negative for cough, hemoptysis, shortness of breath and difficulty breathing.   Cardiovascular:  Negative for chest pain, palpitations,  hypertension and swelling in legs/feet.  Gastrointestinal:  Negative for blood in stool, constipation and diarrhea.  Endocrine: Negative for increased urination.  Genitourinary:  Negative for painful urination.  Musculoskeletal:  Positive for joint pain, joint pain and morning stiffness. Negative for joint swelling, myalgias, muscle weakness, muscle tenderness and myalgias.  Skin:  Negative for color change, pallor, rash, hair loss, nodules/bumps, skin tightness, ulcers and sensitivity to sunlight.  Allergic/Immunologic: Negative for susceptible to infections.  Neurological:  Negative for dizziness, numbness, headaches and weakness.  Hematological:  Negative for swollen glands.  Psychiatric/Behavioral:  Negative for depressed mood and sleep disturbance. The patient is not nervous/anxious.    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   Vitamin D deficiency 01/23/2017   Rheumatoid arthritis, seronegative, multiple sites (HCC) 01/19/2017   High risk medication use 01/19/2017   Primary osteoarthritis of both hands 01/19/2017   Primary osteoarthritis of both knees 01/19/2017   Fatigue 01/19/2017   Thrombocytosis 01/19/2017   Primary osteoarthritis of both feet 01/19/2017   Deficiency  anemia 01/19/2017   Screening-pulmonary TB 04/26/2016    Past Medical History:  Diagnosis Date   Arthritis    Cancer [facility])    History of radiation therapy 05/03/19- 06/25/19   Larynx 35 fractions of 2 Gy each to total 70 Gy   Hypertension     Rheumatoid arthritis (HCC)    Vocal cord mass     Family History  Problem Relation Age of Onset   Stroke Mother    Hypertension Daughter    Colon polyps Neg Hx    Colon cancer Neg Hx    Past Surgical History:  Procedure Laterality Date   ECTOPIC PREGNANCY SURGERY     EXTERNAL EAR SURGERY Right    removal of cyst or gland   IR GASTROSTOMY TUBE MOD SED  04/26/2019   IR GASTROSTOMY TUBE REMOVAL  11/05/2019   IR IMAGING GUIDED PORT INSERTION  04/26/2019   IR REMOVAL TUN ACCESS W/ PORT W/O FL MOD SED  11/05/2019   MICROLARYNGOSCOPY Left 03/26/2019   Procedure: DIRECT MICROLARYNGOSCOPY WITH BIOPSY OF VOCAL CORD MASS;  Surgeon: Newman Pies, MD;  Location: [facility];  Service: ENT;  Laterality: Left;   Social History   Social History Narrative   Patient was recently widowed in March 2020.   Patient has 1 daughter who [place] in [place]   Patient moved to [place] from [place] in January 2009.   Patient is a retired [employer] fund[employer].   Immunization History  Administered Date(s) Administered   Fluad Quad(high Dose 65+) 05/20/2020   Influenza, High Dose Seasonal PF 06/09/2017   PFIZER(Purple Top)SARS-COV-2 Vaccination 08/29/2019, 09/19/2019, 07/11/2020     Objective: Vital Signs: BP (!) 166/91 (BP Location: Right Arm, Patient Position: Sitting, Cuff Size: Normal)    Pulse 80    Ht 5\' 3"  (1.6 m)    Wt 123 lb 3.2 oz (55.9 kg)    BMI 21.82 kg/m    Physical Exam Vitals and nursing note reviewed.  Constitutional:      Appearance: She is well-developed.  HENT:     Head: Normocephalic and atraumatic.  Eyes:     Conjunctiva/sclera: Conjunctivae normal.  Pulmonary:     Effort: Pulmonary effort is normal.  Abdominal:     Palpations: Abdomen is soft.  Musculoskeletal:     Cervical back: Normal range of motion.  Skin:    General: Skin is warm and dry.     Capillary Refill: Capillary refill takes less than 2 seconds.  Neurological:     Mental Status: She is alert and  oriented to person, place, and time.  Psychiatric:        Behavior: Behavior normal.     Musculoskeletal Exam: C-spine has slightly limited range of motion with lateral rotation.  Shoulder joints have good range of motion with no discomfort.  Elbow joints have good range of motion with no elbow joint line tenderness.  Thickening of the olecranon bursa both elbows noted.  Wrist joints have good range of motion with some warmth and tenderness of the right wrist.  Tenderness and synovitis of several MCP and PIP joints.  Left hip has limited range of motion.  Tenderness to palpation over the left trochanteric bursa.  Both knee joints are swollen and no warmth was noted.  Ankle joints have good range of motion with no tenderness or synovitis.  CDAI Exam: CDAI Score: 19  Patient Global: 5 mm; Provider Global: 5 mm Swollen: 9 ; Tender: 9  Joint Exam 08/11/2021  Right  Left  MCP 2  Swollen Tender     MCP 3  Swollen Tender  Swollen Tender  MCP 4     Swollen Tender  MCP 5  Swollen Tender     PIP 2  Swollen Tender  Swollen Tender  PIP 3  Swollen Tender  Swollen Tender     Investigation: No additional findings.  Imaging: No results found.  Recent Labs: Lab Results  Component Value Date   WBC 3.7 (L) 08/07/2021   HGB 12.0 08/07/2021   PLT 240 08/07/2021   NA 142 08/07/2021   K 4.0 08/07/2021   CL 110 08/07/2021   CO2 24 08/07/2021   GLUCOSE 77 08/07/2021   BUN 9 08/07/2021   CREATININE 0.87 08/07/2021   BILITOT 0.3 08/07/2021   ALKPHOS 100 05/29/2021   AST 18 08/07/2021   ALT 27 08/07/2021   PROT 6.2 08/07/2021   ALBUMIN 3.5 05/29/2021   CALCIUM 9.1 08/07/2021   GFRAA 100 09/03/2020   QFTBGOLDPLUS NEGATIVE 08/06/2020    Speciality Comments: PPD- 07/08/17 Negative, Humira started August 06, 2020-inadequate response (last dose 12/24/20), Enbrel started 01/15/21 (stopped 06/17/21), Actemra started 06/24/21  Procedures:  No procedures performed Allergies: Sulfa antibiotics    Assessment / Plan:     Visit Diagnoses: Rheumatoid arthritis, seronegative, multiple sites (HCC) - (D/c MTX 8 tab/wk-elevated creatinine-1.07 on 05/09/20, GFR 60): The patient presents today with ongoing tenderness and synovitis involving multiple joints, most severe in both hands.  She was initiated on Actemra on 06/24/2021 and remains on Arava 20 mg 1 tablet daily.  She has been tolerating these medications without any side effects or injection site reactions.  She has not had any recent infections.  She has noticed about a 90 to 95% improvement in her joint pain, stiffness, and mobility on combination therapy.  Her energy level has improved and she has not been experiencing nocturnal pain.  Her morning stiffness has been lasting less than 1 hour. She will remain on Actemra and Arava as combination therapy.  No medication changes will be made at this time.  She will follow-up in the office in 2 to 3 months to assess her full response to combination therapy.  High risk medication use - Actemra 162 mg sq injections every 14 days (initiated on 06/24/2021) and  Arava 20 mg 1 tablet by mouth daily.  CBC and CMP were updated on 08/07/2021.  Her next lab work will be due in May and every 3 months to monitor for drug toxicity.  Standing orders for CBC and CMP remain in place.  TB Gold pending on 08/07/2021 and will continue to be monitored yearly. She has not had any recent infections. Discussed the importance of holding Actemra and Arava if she develops signs or symptoms of infection and to resume once the infection has completely cleared.  Chronic left shoulder pain: She has good range of motion of the left shoulder joint on examination today.  She has not been experiencing any nocturnal pain.  Her left shoulder joint pain has improved significantly since initiating Actemra.  She will remain on combination therapy as prescribed.  Primary osteoarthritis of both hands: She has PIP and DIP thickening consistent  with osteoarthritis of both hands.  CMC joint thickening was noted bilaterally.  She has ongoing tenderness and synovitis of several PIP joints as described above.  Her fist formation has improved but remains slightly incomplete. She has not had any difficulty with her ADLs recently.  Primary osteoarthritis  of both knees: Her knee joint pain and stiffness has improved significantly.  Her mobility has improved.  She has not been experiencing any nocturnal pain.  She has been able to be more active without difficulty.  On examination she still has swelling in both knees but no warmth was noted.  Primary osteoarthritis of both feet: Both ankle joints have good range of motion with no tenderness or inflammation.  She is wearing proper fitting shoes.  She has not been experiencing any increased discomfort in her feet recently.  Spondylosis without myelopathy or radiculopathy, cervical region: C-spine has slightly limited range of motion with lateral rotation.  No symptoms of radiculopathy at this time.  Age-related osteoporosis without current pathological fracture:  11/02/19: DEXA showed T score of -3.4 in the lumbar region.  She was strongly encouraged to continue taking Fosamax 70 mg 1 tablet by mouth once weekly.  Pain in left hip: She has slightly limited range of motion of the left hip joint on examination today.  She has been experiencing intermittent left groin pain with certain activities.  Her symptoms have become less frequent and less severe recently.  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 Bertis Ruddy and ENT.   History of anemia  Post-menopausal  Thrombocytosis  Abnormal SPEP - Recent lab work updated by Dr. Bertis Ruddy on 05/29/21   Orders: No orders of the defined types were placed in this encounter.  No orders of the defined types were  placed in this encounter.   Follow-Up Instructions: Return in about 2 months (around 10/09/2021) for Rheumatoid arthritis.   Gearldine Bienenstock, PA-C  Note - This record has been created using Dragon software.  Chart creation errors have been sought, but may not always  have been located. Such creation errors do not reflect on  the standard of medical care.

## 2021-08-07 ENCOUNTER — Other Ambulatory Visit: Payer: Self-pay | Admitting: *Deleted

## 2021-08-07 DIAGNOSIS — Z79899 Other long term (current) drug therapy: Secondary | ICD-10-CM

## 2021-08-07 DIAGNOSIS — Z111 Encounter for screening for respiratory tuberculosis: Secondary | ICD-10-CM

## 2021-08-11 ENCOUNTER — Encounter: Payer: Self-pay | Admitting: Physician Assistant

## 2021-08-11 ENCOUNTER — Ambulatory Visit: Payer: Medicare Other | Admitting: Physician Assistant

## 2021-08-11 ENCOUNTER — Other Ambulatory Visit: Payer: Self-pay

## 2021-08-11 VITALS — BP 166/91 | HR 80 | Ht 63.0 in | Wt 123.2 lb

## 2021-08-11 DIAGNOSIS — M19072 Primary osteoarthritis, left ankle and foot: Secondary | ICD-10-CM

## 2021-08-11 DIAGNOSIS — C320 Malignant neoplasm of glottis: Secondary | ICD-10-CM

## 2021-08-11 DIAGNOSIS — M19041 Primary osteoarthritis, right hand: Secondary | ICD-10-CM | POA: Diagnosis not present

## 2021-08-11 DIAGNOSIS — M0609 Rheumatoid arthritis without rheumatoid factor, multiple sites: Secondary | ICD-10-CM

## 2021-08-11 DIAGNOSIS — M47812 Spondylosis without myelopathy or radiculopathy, cervical region: Secondary | ICD-10-CM

## 2021-08-11 DIAGNOSIS — M25512 Pain in left shoulder: Secondary | ICD-10-CM

## 2021-08-11 DIAGNOSIS — Z862 Personal history of diseases of the blood and blood-forming organs and certain disorders involving the immune mechanism: Secondary | ICD-10-CM

## 2021-08-11 DIAGNOSIS — M19071 Primary osteoarthritis, right ankle and foot: Secondary | ICD-10-CM

## 2021-08-11 DIAGNOSIS — Z780 Asymptomatic menopausal state: Secondary | ICD-10-CM

## 2021-08-11 DIAGNOSIS — Z8639 Personal history of other endocrine, nutritional and metabolic disease: Secondary | ICD-10-CM

## 2021-08-11 DIAGNOSIS — Z79899 Other long term (current) drug therapy: Secondary | ICD-10-CM

## 2021-08-11 DIAGNOSIS — M19042 Primary osteoarthritis, left hand: Secondary | ICD-10-CM

## 2021-08-11 DIAGNOSIS — M25552 Pain in left hip: Secondary | ICD-10-CM

## 2021-08-11 DIAGNOSIS — D75839 Thrombocytosis, unspecified: Secondary | ICD-10-CM

## 2021-08-11 DIAGNOSIS — M533 Sacrococcygeal disorders, not elsewhere classified: Secondary | ICD-10-CM

## 2021-08-11 DIAGNOSIS — G8929 Other chronic pain: Secondary | ICD-10-CM

## 2021-08-11 DIAGNOSIS — R778 Other specified abnormalities of plasma proteins: Secondary | ICD-10-CM

## 2021-08-11 DIAGNOSIS — M170 Bilateral primary osteoarthritis of knee: Secondary | ICD-10-CM

## 2021-08-11 DIAGNOSIS — M810 Age-related osteoporosis without current pathological fracture: Secondary | ICD-10-CM

## 2021-08-11 NOTE — Patient Instructions (Signed)
Standing Labs °We placed an order today for your standing lab work.  ° °Please have your standing labs drawn in May and every 3 months  ° ° °If possible, please have your labs drawn 2 weeks prior to your appointment so that the provider can discuss your results at your appointment. ° °Please note that you may see your imaging and lab results in MyChart before we have reviewed them. °We may be awaiting multiple results to interpret others before contacting you. °Please allow our office up to 72 hours to thoroughly review all of the results before contacting the office for clarification of your results. ° °We have open lab daily: °Monday through Thursday from 1:30-4:30 PM and Friday from 1:30-4:00 PM °at the office of Dr. Shaili Deveshwar, [facility].   °Please be advised, all patients with office appointments requiring lab work will take precedent over walk-in lab work.  °If possible, please come for your lab work on Monday and Friday afternoons, as you may experience shorter wait times. °The office is located at [address], [address], [place], [place] [address] °No appointment is necessary.   °Labs are drawn by [facility]. Please bring your co-pay at the time of your lab draw.  You may receive a bill from [facility] for your lab work. ° °Please note if you are on Hydroxychloroquine and and an order has been placed for a Hydroxychloroquine level, you will need to have it drawn 4 hours or more after your last dose. ° °If you wish to have your labs drawn at another location, please call the office 24 hours in advance to send orders. ° °If you have any questions regarding directions or hours of operation,  °please call [phone].   °As a reminder, please drink plenty of water prior to coming for your lab work. Thanks! ° °

## 2021-08-12 LAB — CBC WITH DIFFERENTIAL/PLATELET
Absolute Monocytes: 503 cells/uL (ref 200–950)
Basophils Absolute: 30 cells/uL (ref 0–200)
Basophils Relative: 0.8 %
Eosinophils Absolute: 48 cells/uL (ref 15–500)
Eosinophils Relative: 1.3 %
HCT: 36.1 % (ref 35.0–45.0)
Hemoglobin: 12 g/dL (ref 11.7–15.5)
Lymphs Abs: 862 cells/uL (ref 850–3900)
MCH: 32.6 pg (ref 27.0–33.0)
MCHC: 33.2 g/dL (ref 32.0–36.0)
MCV: 98.1 fL (ref 80.0–100.0)
MPV: 10.8 fL (ref 7.5–12.5)
Monocytes Relative: 13.6 %
Neutro Abs: 2257 cells/uL (ref 1500–7800)
Neutrophils Relative %: 61 %
Platelets: 240 10*3/uL (ref 140–400)
RBC: 3.68 10*6/uL — ABNORMAL LOW (ref 3.80–5.10)
RDW: 14.1 % (ref 11.0–15.0)
Total Lymphocyte: 23.3 %
WBC: 3.7 10*3/uL — ABNORMAL LOW (ref 3.8–10.8)

## 2021-08-12 LAB — COMPLETE METABOLIC PANEL WITH GFR
AG Ratio: 1.8 (calc) (ref 1.0–2.5)
ALT: 27 U/L (ref 6–29)
AST: 18 U/L (ref 10–35)
Albumin: 4 g/dL (ref 3.6–5.1)
Alkaline phosphatase (APISO): 81 U/L (ref 37–153)
BUN: 9 mg/dL (ref 7–25)
CO2: 24 mmol/L (ref 20–32)
Calcium: 9.1 mg/dL (ref 8.6–10.4)
Chloride: 110 mmol/L (ref 98–110)
Creat: 0.87 mg/dL (ref 0.60–1.00)
Globulin: 2.2 g/dL (calc) (ref 1.9–3.7)
Glucose, Bld: 77 mg/dL (ref 65–99)
Potassium: 4 mmol/L (ref 3.5–5.3)
Sodium: 142 mmol/L (ref 135–146)
Total Bilirubin: 0.3 mg/dL (ref 0.2–1.2)
Total Protein: 6.2 g/dL (ref 6.1–8.1)
eGFR: 70 mL/min/{1.73_m2} (ref >=60)

## 2021-08-12 LAB — QUANTIFERON-TB GOLD PLUS
Mitogen-NIL: >10 IU/mL
NIL: 0.03 IU/mL
QuantiFERON-TB Gold Plus: NEGATIVE
TB1-NIL: 0.01 IU/mL
TB2-NIL: 0.01 IU/mL

## 2021-08-12 NOTE — Progress Notes (Signed)
TB gold negative

## 2021-08-17 ENCOUNTER — Other Ambulatory Visit: Payer: Self-pay | Admitting: Physician Assistant

## 2021-08-17 NOTE — Telephone Encounter (Signed)
Next Visit: 11/18/2021  Last Visit: 08/11/2021  Last Fill: 05/29/2021  DX: Age-related osteoporosis without current pathological fracture  Current Dose per office note 08/11/2021: Fosamax 70 mg 1 tablet by mouth once weekly.  Labs: 08/07/2021 CMP WNL.  WBC count is borderline low. RBC count is slightly low but has improved. Rest of CBC WNL.  Okay to refill Fosamax?

## 2021-09-21 ENCOUNTER — Other Ambulatory Visit: Payer: Self-pay | Admitting: Hematology and Oncology

## 2021-09-21 ENCOUNTER — Other Ambulatory Visit: Payer: Self-pay | Admitting: Physician Assistant

## 2021-09-21 NOTE — Telephone Encounter (Signed)
Next Visit: 11/18/2021 ?  ?Last Visit: 08/11/2021 ?  ?Last Fill: 05/28/2021 ? ?Dx: Rheumatoid arthritis, seronegative, multiple sites  ? ?Current Dose per office note 08/11/2021:  Arava 20 mg 1 tablet by mouth daily ? ?Labs: 08/07/2021 CMP WNL.  WBC count is borderline low. RBC count is slightly low but has improved. Rest of CBC WNL. ? ?Okay to refill Arava?  ?

## 2021-09-24 ENCOUNTER — Other Ambulatory Visit: Payer: Self-pay | Admitting: *Deleted

## 2021-09-24 DIAGNOSIS — M0609 Rheumatoid arthritis without rheumatoid factor, multiple sites: Secondary | ICD-10-CM

## 2021-09-24 DIAGNOSIS — Z79899 Other long term (current) drug therapy: Secondary | ICD-10-CM

## 2021-09-24 MED ORDER — ACTEMRA ACTPEN 162 MG/0.9ML ~~LOC~~ SOAJ: 162.0000 mg | SUBCUTANEOUS | 0 refills | Status: DC

## 2021-09-24 NOTE — Telephone Encounter (Signed)
Next Visit: 11/18/2021 ? ?Last Visit: 08/11/2021 ? ?Last Fill: 06/24/2021 ? ?HA:[id number] arthritis, seronegative, multiple sites ? ?Current Dose per office note 08/11/2021: Actemra 162 mg sq injections every 14 days ? ?Labs: 08/07/2021 CMP WNL.  WBC count is borderline low. RBC count is slightly low but has improved. Rest of CBC WNL. ? ?TB Gold: 08/07/2021, negative  ? ?Okay to refill Actemra? ? ?

## 2021-11-03 NOTE — Progress Notes (Incomplete)
Jean [name] presents today for follow-up after completing raditation to her glottis and left vocal cord on 06/25/2019 ? ?Pain issues, if any: *** ?Using a feeding tube?: N/A ?Weight changes, if any: *** ? ?Swallowing issues, if any: *** ?Smoking or chewing tobacco? *** ?Using fluoride trays daily? ***Uses flouride toothpaste and sees a dentist regularly; denies any dental concerns?? ?Last ENT visit was on: ***Dr. Leta Baptist?? ?Other notable issues, if any: ***Last saw her medical oncologist Dr. Alvy Bimler on 06/05/2021 ? ? ? ? ?

## 2021-11-04 ENCOUNTER — Encounter: Payer: Self-pay | Admitting: Radiation Oncology

## 2021-11-04 ENCOUNTER — Ambulatory Visit
Admission: RE | Admit: 2021-11-04 | Discharge: 2021-11-04 | Disposition: A | Discharge status: Home / Self Care | Payer: Medicare Other | Source: Ambulatory Visit | Attending: Radiation Oncology | Admitting: Radiation Oncology

## 2021-11-04 ENCOUNTER — Other Ambulatory Visit: Payer: Self-pay

## 2021-11-04 ENCOUNTER — Ambulatory Visit: Payer: Self-pay | Admitting: Radiation Oncology

## 2021-11-04 VITALS — BP 153/79 | HR 85 | Temp 97.7°F | Resp 18 | Ht 63.0 in | Wt 127.2 lb

## 2021-11-04 DIAGNOSIS — C320 Malignant neoplasm of glottis: Secondary | ICD-10-CM | POA: Diagnosis present

## 2021-11-04 DIAGNOSIS — Z7969 Long term (current) use of other immunomodulators and immunosuppressants: Secondary | ICD-10-CM | POA: Diagnosis not present

## 2021-11-04 DIAGNOSIS — Z79899 Other long term (current) drug therapy: Secondary | ICD-10-CM | POA: Insufficient documentation

## 2021-11-04 DIAGNOSIS — Z923 Personal history of irradiation: Secondary | ICD-10-CM | POA: Diagnosis not present

## 2021-11-04 LAB — TSH: TSH: 5.861 u[IU]/mL — ABNORMAL HIGH (ref 0.350–4.500)

## 2021-11-04 MED ORDER — OXYMETAZOLINE HCL 0.05 % NA SOLN
1.0000 | Freq: Two times a day (BID) | NASAL | Status: DC
Start: 2021-11-04 — End: 2021-11-05
  Administered 2021-11-04: 1 via NASAL
  Filled 2021-11-04: qty 30

## 2021-11-04 NOTE — Progress Notes (Signed)
Office Visit Note  Patient: Jean [name]             Date of Birth: [birth year]           MRN: [id number]             PCP: [name], MD Referring: [name], MD Visit Date: 11/18/2021 Occupation: @GUAROCC @  Subjective:  Right hand pain and swelling  History of Present Illness: Jean [name] is a 77 y.o. female with history of seronegative rheumatoid arthritis and osteoarthritis.  She has been taking Actemra injections every other week and leflunomide 20 mg p.o. daily.  She continues to have some pain and swelling in her right hand.  She also has discomfort in her cervical spine and has intermittent radiculopathy to her right hand.  She denies any discomfort in her knees and her feet currently.  She states that the left hip pain has resolved.  She is trying to take Fosamax once a week but she misses some doses.  She is taking vitamin D on a regular basis.  She states she had upper respiratory tract infection in February which lingered for about a month and then resolved.  Activities of Daily Living:  Patient reports morning stiffness for 1 hour.   Patient Denies nocturnal pain.  Difficulty dressing/grooming: Reports Difficulty climbing stairs: Denies Difficulty getting out of chair: Denies Difficulty using hands for taps, buttons, cutlery, and/or writing: Reports  Review of Systems  Constitutional:  Positive for fatigue.  HENT:  Negative for mouth sores, mouth dryness and nose dryness.   Eyes:  Negative for pain, itching and dryness.  Respiratory:  Negative for shortness of breath and difficulty breathing.   Cardiovascular:  Negative for chest pain and palpitations.  Gastrointestinal:  Negative for blood in stool, constipation and diarrhea.  Endocrine: Negative for increased urination.  Genitourinary:  Negative for difficulty urinating.  Musculoskeletal:  Positive for joint pain, joint pain, joint swelling, myalgias, morning stiffness, muscle tenderness and  myalgias.  Skin:  Negative for color change, rash and redness.  Allergic/Immunologic: Negative for susceptible to infections.  Neurological:  Positive for numbness. Negative for dizziness, headaches, memory loss and weakness.  Hematological:  Positive for bruising/bleeding tendency.  Psychiatric/Behavioral:  Negative for confusion.    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   Vitamin D deficiency 01/23/2017   Rheumatoid arthritis, seronegative, multiple sites (HCC) 01/19/2017   High risk medication use 01/19/2017   Primary osteoarthritis of both hands 01/19/2017   Primary osteoarthritis of both knees 01/19/2017   Fatigue 01/19/2017   Thrombocytosis 01/19/2017   Primary osteoarthritis of both feet 01/19/2017   Deficiency anemia 01/19/2017   Screening-pulmonary TB 04/26/2016    Past Medical History:  Diagnosis Date   Arthritis    Cancer [facility])    History of radiation therapy 05/03/19- 06/25/19   Larynx 35 fractions of 2 Gy each to total 70 Gy   Hypertension    Rheumatoid arthritis (HCC)    Vocal cord mass     Family History  Problem Relation Age of  Onset   Stroke Mother    Hypertension Daughter    Colon polyps Neg Hx    Colon cancer Neg Hx    Past Surgical History:  Procedure Laterality Date   ECTOPIC PREGNANCY SURGERY     EXTERNAL EAR SURGERY Right    removal of cyst or gland   IR GASTROSTOMY TUBE MOD SED  04/26/2019   IR GASTROSTOMY TUBE REMOVAL  11/05/2019   IR IMAGING GUIDED PORT INSERTION  04/26/2019   IR  REMOVAL TUN ACCESS W/ PORT W/O FL MOD SED  11/05/2019   MICROLARYNGOSCOPY Left 03/26/2019   Procedure: DIRECT MICROLARYNGOSCOPY WITH BIOPSY OF VOCAL CORD MASS;  Surgeon: Newman Pies, MD;  Location: [facility];  Service: ENT;  Laterality: Left;   Social History   Social History Narrative   Patient was recently widowed in March 2020.   Patient has 1 daughter who lives in [place]   Patient moved to [place] from [place] in January 2009.   Patient is a retired [employer] fund[employer].   Immunization History  Administered Date(s) Administered   Fluad Quad(high Dose 65+) 05/20/2020   Influenza, High Dose Seasonal PF 06/09/2017   PFIZER(Purple Top)SARS-COV-2 Vaccination 08/29/2019, 09/19/2019, 07/11/2020     Objective: Vital Signs: BP (!) 146/86 (BP Location: Left Arm, Patient Position: Sitting, Cuff Size: Normal)   Pulse 79   Ht 5\' 3"  (1.6 m)   Wt 127 lb 3.2 oz (57.7 kg)   BMI 22.53 kg/m    Physical Exam Vitals and nursing note reviewed.  Constitutional:      Appearance: She is well-developed.  HENT:     Head: Normocephalic and atraumatic.  Eyes:     Conjunctiva/sclera: Conjunctivae normal.  Cardiovascular:     Rate and Rhythm: Normal rate and regular rhythm.     Heart sounds: Normal heart sounds.  Pulmonary:     Effort: Pulmonary effort is normal.     Breath sounds: Normal breath sounds.  Abdominal:     General: Bowel sounds are normal.     Palpations: Abdomen is soft.  Musculoskeletal:     Cervical back: Normal range of motion.  Lymphadenopathy:     Cervical: No cervical adenopathy.  Skin:    General: Skin is warm and dry.     Capillary Refill: Capillary refill takes less than 2 seconds.  Neurological:     Mental Status: She is alert and oriented to person, place, and time.  Psychiatric:        Behavior: Behavior normal.     Musculoskeletal Exam: She had discomfort range of motion of her cervical spine.  Shoulder joints and elbow joints in good range of  motion.  She had synovitis and tenosynovitis in bilateral wrist joints.  She has synovitis over several of the MCPs and PIP joints as described below.  She had incomplete fist formation bilaterally.  She had warmth on palpation of her knee joints without any swelling.  There was no tenderness over ankles or MTPs.  CDAI Exam: CDAI Score: 27.6  Patient Global: 8 mm; Provider Global: 8 mm Swollen: 13 ; Tender: 13  Joint Exam 11/18/2021      Right  Left  Wrist  Swollen Tender  Swollen Tender  MCP 2  Swollen Tender     MCP 3  Swollen Tender  Swollen Tender  MCP 5  Swollen Tender     PIP 3  Swollen Tender  Swollen Tender  PIP 4  Swollen Tender  Swollen Tender  PIP 5     Swollen Tender  Knee  Swollen Tender  Swollen Tender     Investigation: No additional findings.  Imaging: No results found.  Recent Labs: Lab Results  Component Value Date   WBC 3.1 (L) 11/13/2021   HGB 12.3 11/13/2021   PLT 295 11/13/2021   NA 143 11/13/2021   K 4.4 11/13/2021   CL 109 11/13/2021   CO2 26 11/13/2021   GLUCOSE 91 11/13/2021   BUN 10 11/13/2021   CREATININE 0.75 11/13/2021   BILITOT 0.3 11/13/2021   ALKPHOS 100 05/29/2021   AST 17 11/13/2021   ALT 12 11/13/2021   PROT 6.4 11/13/2021   ALBUMIN 3.5 05/29/2021   CALCIUM 9.5 11/13/2021   GFRAA 100 09/03/2020   QFTBGOLDPLUS NEGATIVE 08/07/2021    Speciality Comments: PPD- 07/08/17 Negative, Humira started August 06, 2020-inadequate response (last dose 12/24/20), Enbrel started 01/15/21 (stopped 06/17/21), Actemra started 06/24/21,MTX-high Cr 11/21  Procedures:  No procedures performed Allergies: Sulfa antibiotics   Assessment / Plan:     Visit Diagnoses: Rheumatoid arthritis, seronegative, multiple sites (HCC) -she continues to have pain and swelling in her bilateral hands.  Overall her symptoms are improving.  She had swelling in her wrist joints as well.  She has been on Actemra since January 2023.  She is also taking leflunomide 20 mg p.o.  daily for almost a year .  She has noticed improvement in the swelling in her knee joints and in her feet.  She states she is able to walk much better and has been walking on a regular basis.  She continues to have difficulty holding objects and doing routine activities due to arthritis in her hands.  We had a detailed discussion regarding different treatment options.  She has tried Humira and Enbrel in the past but were discontinued due to inadequate response.  She would like to stay on tocilizumab.  I discussed the option of trying prednisone taper and then keeping her on low-dose prednisone as a bridging therapy.  She was in agreement.  Side effects of prednisone were discussed at length.  I will place her on prednisone 20 mg p.o. daily for a week and then taper by 5 mg every week.  She will stay on 5 mg of prednisone until the follow-up visit in 2 months.  My plan is to taper her to the lowest dose of prednisone which will keep her synovitis under control.  High risk medication use - Actemra 162 mg sq injections every 14 days (initiated on 06/24/2021) and  Arava 20 mg 1 tablet by mouth daily.  Labs obtained on Nov 13, 2021 showed white cell count 3.1.  CMP was normal.  TB gold was negative on August 07, 2021.  She was advised to get labs every 3 months to monitor for drug toxicity.  White cell count is low due to immunosuppression.  Information regarding immunization was placed in the AVS.  She was also advised to hold Actemra and Arava in case she develops an infection.  Primary osteoarthritis of both hands-she has osteoarthritis in her hands with bilateral PIP and DIP thickening.  She also had synovitis in her hands due to underlying osteoarthritis.  Primary osteoarthritis of both knees-she states that the discomfort in her knee joints has improved.  She had warmth on palpation but no swelling.  Primary osteoarthritis of both feet-she denies any discomfort in her feet.  There was no tenderness or  synovitis.  Spondylosis without myelopathy or radiculopathy, cervical  region-she continues to have some discomfort in her cervical spine.  C-spine exercises were discussed.  Age-related osteoporosis without current pathological fracture - 11/02/19: DEXA showed T score of -3.4 in the lumbar region.  Fosamax 70 mg p.o. weekly was a started in May 2021.  She has been missing few doses.  Patient states she takes about two Fosamax a month.  We discussed possibility of IV Reclast in the future if the compliance stays an issue.  Patient states that she will try to take Fosamax on a regular basis.  Need for regular exercise was emphasized.  History of vitamin D deficiency-she has been taking vitamin D on a regular basis.  Her vitamin D was 55.17 on May 29, 2021.  Abnormal SPEP - Recent lab work updated by Dr. Bertis Ruddy on 05/29/21   History of anemia-improved.  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 Bertis Ruddy and ENT. S/P CTX and RTX.  Post-menopausal  Orders: No orders of the defined types were placed in this encounter.  Meds ordered this encounter  Medications   predniSONE (DELTASONE) 5 MG tablet    Sig: Take 4 tabs po qd x 7 days, 3  tabs po qd x 7 days, 2  tabs po qd x 7 days, 1  tab po qd.    Dispense:  100 tablet    Refill:  0     Follow-Up Instructions: Return in about 2 months (around 01/18/2022) for Rheumatoid arthritis, Osteoarthritis.   Pollyann Savoy, MD  Note - This record has been created using Animal nutritionist.  Chart creation errors have been sought, but may not always  have been located. Such creation errors do not reflect on  the standard of medical care.

## 2021-11-04 NOTE — Progress Notes (Signed)
Ms. Atilano presents today for follow-up after completing raditation to her glottis and left vocal cord on 06/25/2019 ? ?Pain issues, if any: no ?Using a feeding tube?: N/A ?Weight changes, if any: States she had gain 20 pounds ? ?Swallowing issues, if any: no ?Smoking or chewing tobacco? no ?Using fluoride trays daily? Uses flouride toothpaste and sees a dentist regularly; denies any dental concerns ?Last ENT visit was on: Dr. Leta Baptist Will see in June. ?Other notable issues, if any: Last saw her medical oncologist Dr. Alvy Bimler on 06/05/2021 ?Vitals:  ? 11/04/21 1130  ?BP: (!) 153/79  ?Pulse: 85  ?Resp: 18  ?Temp: 97.7 ?F (36.5 ?C)  ?SpO2: 100%  ?Weight: 57.7 kg  ?Height: '5\' 3"'$  (1.6 m)  ? ? ?Blood pressure is elevated denies any s/s of hypertension ? ? ? ?

## 2021-11-04 NOTE — Progress Notes (Signed)
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)  C32.0       Cancer Staging  Squamous cell carcinoma of left vocal cord [facility]) Staging form: Larynx - Glottis, AJCC 8th Edition - Clinical stage from 04/11/2019: Stage III (cT3, cN0, cM0) - Signed by Lonie Peak, MD on 04/11/2019 Stage prefix: Initial diagnosis   CHIEF COMPLAINT:  Here for follow-up and surveillance of glottic cancer  Narrative:   Jean [name] presents today for follow-up after completing raditation to her glottis and left vocal cord on 06/25/2019  Pain issues, if any: no Using a feeding tube?: N/A Weight changes, if any: States she had gained 20 pounds Wt Readings from Last 3 Encounters:  11/04/21 127 lb 3.2 oz (57.7 kg)  08/11/21 123 lb 3.2 oz (55.9 kg)  06/08/21 119 lb 3.2 oz (54.1 kg)    Swallowing issues, if any: no Smoking or chewing tobacco? no Using fluoride trays daily? Uses flouride toothpaste and sees a dentist regularly; denies any dental concerns Last ENT visit was on: Dr. Newman [name] Will see in June. Other notable issues, if any: Last saw her medical oncologist Dr. Bertis [name] on 06/05/2021 Vitals:   11/04/21 1130  BP: (!) 153/79  Pulse: 85  Resp: 18  Temp: 97.7 F (36.5 C)  SpO2: 100%  Weight: 127 lb 3.2 oz (57.7 kg)  Height: 5\' 3"  (1.6 m)    Blood pressure is elevated denies any s/s of hypertension       ALLERGIES:  is allergic to sulfa antibiotics.  Meds: Current Outpatient Medications  Medication Sig Dispense Refill   Cholecalciferol (VITAMIN D) 50 MCG (2000 UT) CAPS Take 2,000 Units by mouth daily.      Docusate Calcium (STOOL SOFTENER PO) Take by mouth.      leflunomide (ARAVA) 20 MG tablet TAKE 1 TABLET(20 MG) BY MOUTH DAILY 90 tablet 0   losartan (COZAAR) 100 MG tablet Take 100 mg by mouth daily.   0   metoprolol succinate (TOPROL-XL) 50 MG 24 hr tablet Take 50 mg by mouth daily.     omeprazole (PRILOSEC) 40 MG capsule Take 1 capsule (40 mg total) by mouth daily. 90 capsule 3   Tocilizumab (ACTEMRA ACTPEN) 162 MG/0.9ML SOAJ Inject 162 mg into the skin every 14 (fourteen) days. 5.4 mL 0   TURMERIC CURCUMIN PO Take 1,500 mg by mouth 3 (three) times daily. With bioperine     vitamin B-12 (CYANOCOBALAMIN) 1000 MCG tablet Take 1,000 mcg by mouth daily.     alendronate (FOSAMAX) 70 MG tablet TAKE 1 TABLET(70 MG) BY MOUTH 1 TIME A WEEK WITH A FULL GLASS OF WATER AND ON AN EMPTY STOMACH 12 tablet 0   famotidine (PEPCID) 20 MG tablet Take 1 tablet (20 mg total) by mouth 2 (two) times daily as needed for heartburn or indigestion. (Patient not taking: Reported on 05/11/2021) 90 tablet 3   No current facility-administered medications for this encounter.    Physical Findings: The patient is in no acute distress. Patient is alert and oriented. Wt Readings from Last 3 Encounters:  11/04/21 127 lb 3.2 oz (57.7  kg)  08/11/21 123 lb 3.2 oz (55.9 kg)  06/08/21 119 lb 3.2 oz (54.1 kg)    height is 5\' 3"  (1.6 m) and weight is 127 lb 3.2 oz (57.7 kg). Her temperature is 97.7 F (36.5 C). Her blood pressure is 153/79 (abnormal) and her pulse is 85. Her respiration is 18 and oxygen saturation is 100%. .  General: Alert and oriented, in no acute distress with good voice quality HEENT: Head is normocephalic. Extraocular movements are intact. Oropharynx is notable for no lesions, oral cavity is moist with no thrush Neck: Neck is notable for no palpable adenopathy. Skin: Skin in treatment fields shows satisfactory healing  - smooth, intact. Chest: Clear to auscultation bilaterally Heart regular in rhythm with reg rate Psychiatric: Judgment and insight are intact.  Affect is appropriate.  PROCEDURE NOTE: After obtaining consent and spraying nasal cavity with topical oxymetazoline, the flexible endoscope was coated with lidocaine gel and introduced and passed through the nasal cavity.  The nasopharynx, oropharynx, hypopharynx, and larynx  were then examined. No lesions appreciated in the mucosal axis.  The true cords were symmetrically mobile. She tolerated this well   Lab Findings: Lab Results  Component Value Date   WBC 3.7 (L) 08/07/2021   HGB 12.0 08/07/2021   HCT 36.1 08/07/2021   MCV 98.1 08/07/2021   PLT 240 08/07/2021    Lab Results  Component Value Date   TSH 5.861 (H) 11/04/2021    Radiographic Findings: No results found.  Impression/Plan:    1) Head and Neck Cancer Status: No evidence of disease  2) Nutritional Status: some weight gain TSH slightly elevated today - Free T4 normal Wt Readings from Last 3 Encounters:  11/04/21 127 lb 3.2 oz (57.7 kg)  08/11/21 123 lb 3.2 oz (55.9 kg)  06/08/21 119 lb 3.2 oz (54.1 kg)    3) Risk Factors: The patient has been educated about risk factors including alcohol and tobacco abuse; they understand that avoidance of alcohol and tobacco is important to prevent recurrences as well as other cancers  4) Swallowing: Good function, instructed to continue speech-language pathology exercises  5) Dental: Encouraged to continue regular followup with dentistry, and dental hygiene including fluoride rinses.     6) Thyroid function: Check annually-this will be followed in medical oncology.  Pt knows to ask her PCP to do this if she does not continue to check a TSH annually at [facility]  Her TSH was a little high but Free T4 is normal today. Lab Results  Component Value Date   TSH 5.861 (H) 11/04/2021    7) Other: She can move back her ENT visit to 19mo from now. As she is NED 2+yrs post tx and doing well symptomatically, I will see her PRN moving forward.   Date of service, in total, I spent 30  minutes on this encounter.  Patient was seen in person.   Note signed day after visit. Minutes pertain only to date of service.   _____________________________________   Lonie Peak, MD

## 2021-11-05 ENCOUNTER — Other Ambulatory Visit: Payer: Self-pay

## 2021-11-05 ENCOUNTER — Encounter: Payer: Self-pay | Admitting: Radiation Oncology

## 2021-11-05 ENCOUNTER — Other Ambulatory Visit: Payer: Self-pay | Admitting: Physician Assistant

## 2021-11-05 DIAGNOSIS — C320 Malignant neoplasm of glottis: Secondary | ICD-10-CM

## 2021-11-05 LAB — T4, FREE: Free T4: 0.72 ng/dL (ref 0.61–1.12)

## 2021-11-05 NOTE — Telephone Encounter (Signed)
Next Visit: 11/18/2021   Last Visit: 08/11/2021   Last Fill: 08/17/2021  DX: Age-related osteoporosis without current pathological fracture   Current Dose per office note 08/11/2021: Fosamax 70 mg 1 tablet by mouth once weekly.   Labs: 08/07/2021 CMP WNL.  WBC count is borderline low. RBC count is slightly low but has improved. Rest of CBC WNL.   Okay to refill Fosamax?

## 2021-11-13 ENCOUNTER — Other Ambulatory Visit: Payer: Self-pay | Admitting: *Deleted

## 2021-11-13 DIAGNOSIS — Z79899 Other long term (current) drug therapy: Secondary | ICD-10-CM

## 2021-11-14 LAB — CBC WITH DIFFERENTIAL/PLATELET
Absolute Monocytes: 434 cells/uL (ref 200–950)
Basophils Absolute: 19 cells/uL (ref 0–200)
Basophils Relative: 0.6 %
Eosinophils Absolute: 40 cells/uL (ref 15–500)
Eosinophils Relative: 1.3 %
HCT: 37.8 % (ref 35.0–45.0)
Hemoglobin: 12.3 g/dL (ref 11.7–15.5)
Lymphs Abs: 800 cells/uL — ABNORMAL LOW (ref 850–3900)
MCH: 32.5 pg (ref 27.0–33.0)
MCHC: 32.5 g/dL (ref 32.0–36.0)
MCV: 100 fL (ref 80.0–100.0)
MPV: 10 fL (ref 7.5–12.5)
Monocytes Relative: 14 %
Neutro Abs: 1807 cells/uL (ref 1500–7800)
Neutrophils Relative %: 58.3 %
Platelets: 295 10*3/uL (ref 140–400)
RBC: 3.78 10*6/uL — ABNORMAL LOW (ref 3.80–5.10)
RDW: 12.8 % (ref 11.0–15.0)
Total Lymphocyte: 25.8 %
WBC: 3.1 10*3/uL — ABNORMAL LOW (ref 3.8–10.8)

## 2021-11-14 LAB — COMPLETE METABOLIC PANEL WITH GFR
AG Ratio: 1.9 (calc) (ref 1.0–2.5)
ALT: 12 U/L (ref 6–29)
AST: 17 U/L (ref 10–35)
Albumin: 4.2 g/dL (ref 3.6–5.1)
Alkaline phosphatase (APISO): 102 U/L (ref 37–153)
BUN: 10 mg/dL (ref 7–25)
CO2: 26 mmol/L (ref 20–32)
Calcium: 9.5 mg/dL (ref 8.6–10.4)
Chloride: 109 mmol/L (ref 98–110)
Creat: 0.75 mg/dL (ref 0.60–1.00)
Globulin: 2.2 g/dL (calc) (ref 1.9–3.7)
Glucose, Bld: 91 mg/dL (ref 65–99)
Potassium: 4.4 mmol/L (ref 3.5–5.3)
Sodium: 143 mmol/L (ref 135–146)
Total Bilirubin: 0.3 mg/dL (ref 0.2–1.2)
Total Protein: 6.4 g/dL (ref 6.1–8.1)
eGFR: 84 mL/min/{1.73_m2} (ref >=60)

## 2021-11-14 NOTE — Progress Notes (Signed)
WBC count is low due to immunosuppression.  Patient is a still having active disease with significant swelling at the last visit.  Please advise her to get repeat CBC in 1 month.  CMP is normal.

## 2021-11-17 ENCOUNTER — Other Ambulatory Visit: Payer: Self-pay | Admitting: *Deleted

## 2021-11-17 DIAGNOSIS — R778 Other specified abnormalities of plasma proteins: Secondary | ICD-10-CM

## 2021-11-17 DIAGNOSIS — Z79899 Other long term (current) drug therapy: Secondary | ICD-10-CM

## 2021-11-17 DIAGNOSIS — M0609 Rheumatoid arthritis without rheumatoid factor, multiple sites: Secondary | ICD-10-CM

## 2021-11-18 ENCOUNTER — Ambulatory Visit: Payer: Medicare Other | Admitting: Rheumatology

## 2021-11-18 ENCOUNTER — Encounter: Payer: Self-pay | Admitting: Rheumatology

## 2021-11-18 VITALS — BP 146/86 | HR 79 | Ht 63.0 in | Wt 127.2 lb

## 2021-11-18 DIAGNOSIS — G8929 Other chronic pain: Secondary | ICD-10-CM

## 2021-11-18 DIAGNOSIS — M47812 Spondylosis without myelopathy or radiculopathy, cervical region: Secondary | ICD-10-CM

## 2021-11-18 DIAGNOSIS — M170 Bilateral primary osteoarthritis of knee: Secondary | ICD-10-CM | POA: Diagnosis not present

## 2021-11-18 DIAGNOSIS — M19041 Primary osteoarthritis, right hand: Secondary | ICD-10-CM

## 2021-11-18 DIAGNOSIS — Z8639 Personal history of other endocrine, nutritional and metabolic disease: Secondary | ICD-10-CM

## 2021-11-18 DIAGNOSIS — M19072 Primary osteoarthritis, left ankle and foot: Secondary | ICD-10-CM

## 2021-11-18 DIAGNOSIS — M0609 Rheumatoid arthritis without rheumatoid factor, multiple sites: Secondary | ICD-10-CM

## 2021-11-18 DIAGNOSIS — R778 Other specified abnormalities of plasma proteins: Secondary | ICD-10-CM

## 2021-11-18 DIAGNOSIS — C320 Malignant neoplasm of glottis: Secondary | ICD-10-CM

## 2021-11-18 DIAGNOSIS — M19071 Primary osteoarthritis, right ankle and foot: Secondary | ICD-10-CM

## 2021-11-18 DIAGNOSIS — Z79899 Other long term (current) drug therapy: Secondary | ICD-10-CM | POA: Diagnosis not present

## 2021-11-18 DIAGNOSIS — Z780 Asymptomatic menopausal state: Secondary | ICD-10-CM

## 2021-11-18 DIAGNOSIS — M810 Age-related osteoporosis without current pathological fracture: Secondary | ICD-10-CM

## 2021-11-18 DIAGNOSIS — D75839 Thrombocytosis, unspecified: Secondary | ICD-10-CM

## 2021-11-18 DIAGNOSIS — Z862 Personal history of diseases of the blood and blood-forming organs and certain disorders involving the immune mechanism: Secondary | ICD-10-CM

## 2021-11-18 DIAGNOSIS — M25552 Pain in left hip: Secondary | ICD-10-CM

## 2021-11-18 DIAGNOSIS — M19042 Primary osteoarthritis, left hand: Secondary | ICD-10-CM

## 2021-11-18 MED ORDER — PREDNISONE 5 MG PO TABS: ORAL_TABLET | ORAL | 0 refills | Status: DC

## 2021-11-18 NOTE — Patient Instructions (Signed)
Standing Labs We placed an order today for your standing lab work.   Please have your standing labs drawn in August  and every 3 months  If possible, please have your labs drawn 2 weeks prior to your appointment so that the provider can discuss your results at your appointment.  Please note that you may see your imaging and lab results in MyChart before we have reviewed them. We may be awaiting multiple results to interpret others before contacting you. Please allow our office up to 72 hours to thoroughly review all of the results before contacting the office for clarification of your results.  We have open lab daily: Monday through Thursday from 1:30-4:30 PM and Friday from 1:30-4:00 PM at the office of Dr. Janeice Stegall,  Rheumatology.   Please be advised, all patients with office appointments requiring lab work will take precedent over walk-in lab work.  If possible, please come for your lab work on Monday and Friday afternoons, as you may experience shorter wait times. The office is located at [address], [address], [place], [place] [address] No appointment is necessary.   Labs are drawn by [facility]. Please bring your co-pay at the time of your lab draw.  You may receive a bill from [facility] for your lab work.  Please note if you are on Hydroxychloroquine and and an order has been placed for a Hydroxychloroquine level, you will need to have it drawn 4 hours or more after your last dose.  If you wish to have your labs drawn at another location, please call the office 24 hours in advance to send orders.  If you have any questions regarding directions or hours of operation,  please call [phone].   As a reminder, please drink plenty of water prior to coming for your lab work. Thanks!   Vaccines You are taking a medication(s) that can suppress your immune system.  The following immunizations are recommended: Flu annually Covid-19  Td/Tdap (tetanus, diphtheria,  pertussis) every 10 years Pneumonia (Prevnar 15 then Pneumovax 23 at least 1 year apart.  Alternatively, can take Prevnar 20 without needing additional dose) Shingrix: 2 doses from 4 weeks to 6 months apart  Please check with your PCP to make sure you are up to date.   If you have signs or symptoms of an infection or start antibiotics: First, call your PCP for workup of your infection. Hold your medication through the infection, until you complete your antibiotics, and until symptoms resolve if you take the following: Injectable medication (Actemra, Benlysta, Cimzia, Cosentyx, Enbrel, Humira, Kevzara, Orencia, Remicade, Simponi, Stelara, Taltz, Tremfya) Methotrexate Leflunomide (Arava) Mycophenolate (Cellcept) Xeljanz, Olumiant, or Rinvoq  

## 2021-12-20 ENCOUNTER — Other Ambulatory Visit: Payer: Self-pay | Admitting: Physician Assistant

## 2021-12-21 ENCOUNTER — Other Ambulatory Visit: Payer: Self-pay | Admitting: *Deleted

## 2021-12-21 DIAGNOSIS — R778 Other specified abnormalities of plasma proteins: Secondary | ICD-10-CM

## 2021-12-21 DIAGNOSIS — M0609 Rheumatoid arthritis without rheumatoid factor, multiple sites: Secondary | ICD-10-CM

## 2021-12-21 DIAGNOSIS — Z79899 Other long term (current) drug therapy: Secondary | ICD-10-CM

## 2021-12-21 LAB — CBC WITH DIFFERENTIAL/PLATELET
Absolute Monocytes: 511 cells/uL (ref 200–950)
Basophils Absolute: 20 cells/uL (ref 0–200)
Basophils Relative: 0.5 %
Eosinophils Absolute: 31 cells/uL (ref 15–500)
Eosinophils Relative: 0.8 %
HCT: 37.8 % (ref 35.0–45.0)
Hemoglobin: 12.5 g/dL (ref 11.7–15.5)
Lymphs Abs: 889 cells/uL (ref 850–3900)
MCH: 34.2 pg — ABNORMAL HIGH (ref 27.0–33.0)
MCHC: 33.1 g/dL (ref 32.0–36.0)
MCV: 103.3 fL — ABNORMAL HIGH (ref 80.0–100.0)
MPV: 9.9 fL (ref 7.5–12.5)
Monocytes Relative: 13.1 %
Neutro Abs: 2449 cells/uL (ref 1500–7800)
Neutrophils Relative %: 62.8 %
Platelets: 258 10*3/uL (ref 140–400)
RBC: 3.66 10*6/uL — ABNORMAL LOW (ref 3.80–5.10)
RDW: 13.6 % (ref 11.0–15.0)
Total Lymphocyte: 22.8 %
WBC: 3.9 10*3/uL (ref 3.8–10.8)

## 2021-12-21 NOTE — Telephone Encounter (Signed)
Next Visit: 01/18/2022  Last Visit: 11/18/2021  Last Fill: 09/21/2021  DX: Rheumatoid arthritis, seronegative, multiple sites   Current Dose per office note 11/18/2021: Arava 20 mg 1 tablet by mouth daily  Labs: 11/13/2021, WBC count is low due to immunosuppression.  Patient is a still having active disease with significant swelling at the last visit.  Please advise her to get repeat CBC in 1 month.  CMP is normal. I called patient to have CBC drawn.  Okay to refill Arava?

## 2021-12-24 NOTE — Progress Notes (Signed)
White cell count is normal now.

## 2022-01-04 NOTE — Progress Notes (Unsigned)
Office Visit Note  Patient: Jean [name]             Date of Birth: [birth year]           MRN: [id number]             PCP: [name], MD Referring: [name], MD Visit Date: 01/18/2022 Occupation: @GUAROCC @  Subjective:  Medication monitoring   History of Present Illness: Jean [name] is a 76 y.o. female with history of seronegative rheumatoid arthritis, osteoarthritis, and osteoporosis.  She remains on Actemra 162 mg sq injections every 14 days (initiated on 06/24/2021) and  Arava 20 mg 1 tablet by mouth daily.  She has not missed any doses of Actemra or Reyvow recently.  She was having a severe flare when last seen in the office on 11/18/2021.  She was started on a prednisone taper starting at 20 mg tapering by 5 mg every week.  She discontinued prednisone on 01/10/22.  Her symptoms improved significantly while taking prednisone.  She is currently able to make a complete fist.  She currently takes her rheumatoid arthritis a 4 out of 10 with regards to pain and swelling. She is taking Fosamax 70 mg 1 tablet by mouth once weekly and remains on a vitamin D supplement daily.  She denies any recent falls or fractures. She denies any recent infections.    Activities of Daily Living:  Patient reports morning stiffness for 1 hour.   Patient Denies nocturnal pain.  Difficulty dressing/grooming: Denies Difficulty climbing stairs: Denies Difficulty getting out of chair: Denies Difficulty using hands for taps, buttons, cutlery, and/or writing: Denies  Review of Systems  Constitutional:  Positive for fatigue.  HENT:  Negative for mouth sores and mouth dryness.   Eyes:  Negative for dryness.  Respiratory:  Negative for shortness of breath.   Cardiovascular:  Negative for chest pain and palpitations.  Gastrointestinal:  Negative for blood in stool, constipation and diarrhea.  Endocrine: Negative for increased urination.  Genitourinary:  Negative for involuntary urination.   Musculoskeletal:  Positive for joint swelling, myalgias, morning stiffness, muscle tenderness and myalgias. Negative for joint pain, joint pain and muscle weakness.  Skin:  Positive for color change. Negative for rash, hair loss and sensitivity to sunlight.  Allergic/Immunologic: Negative for susceptible to infections.  Neurological:  Negative for dizziness and headaches.  Hematological:  Negative for swollen glands.  Psychiatric/Behavioral:  Negative for depressed mood and sleep disturbance. The patient is not nervous/anxious.     PMFS History:  Patient Active Problem List   Diagnosis Date Noted  . Osteoporosis 02/27/2021  . Essential hypertension 02/27/2021  . Gastritis 09/08/2020  . Iron deficiency anemia 09/08/2020  . Preventive measure 05/20/2020  . MGUS (monoclonal gammopathy of unknown significance) 12/25/2019  . Acquired hypothyroidism 12/25/2019  . Protein malnutrition (HCC) 05/16/2019  . Constipation due to opioid therapy 05/09/2019  . Port-A-Cath in place 05/02/2019  . 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 Vitamin D deficiency 01/23/2017  . Rheumatoid arthritis, seronegative, multiple sites (HCC) 01/19/2017  . High risk medication use 01/19/2017  . Primary osteoarthritis of both hands 01/19/2017  . Primary osteoarthritis of both knees 01/19/2017  . Fatigue 01/19/2017  . Thrombocytosis 01/19/2017  . Primary osteoarthritis of both feet 01/19/2017  . Deficiency anemia 01/19/2017  . Screening-pulmonary TB 04/26/2016    Past Medical History:  Diagnosis Date  . Arthritis   . Cancer (HCC)   . History of radiation therapy 05/03/19-  06/25/19   Larynx 35 fractions of 2 Gy each to total 70 Gy  . Hypertension   . Rheumatoid arthritis (HCC)   . Vocal cord mass     Family History  Problem Relation Age of Onset  . Stroke Mother   . Hypertension Daughter   . Colon polyps Neg Hx   . Colon cancer Neg Hx    Past Surgical History:  Procedure  Laterality Date  . ECTOPIC PREGNANCY SURGERY    . EXTERNAL EAR SURGERY Right    removal of cyst or gland  . IR GASTROSTOMY TUBE MOD SED  04/26/2019  . IR GASTROSTOMY TUBE REMOVAL  11/05/2019  . IR IMAGING GUIDED PORT INSERTION  04/26/2019  . IR REMOVAL TUN ACCESS W/ PORT W/O FL MOD SED  11/05/2019  . MICROLARYNGOSCOPY Left 03/26/2019   Procedure: DIRECT MICROLARYNGOSCOPY WITH BIOPSY OF VOCAL CORD MASS;  Surgeon: Newman Pies, MD;  Location: [facility];  Service: ENT;  Laterality: Left;   Social History   Social History Narrative   Patient was recently widowed in March 2020.   Patient has 1 daughter who lives in [place]   Patient moved to [place] from [place] in January 2009.   Patient is a retired Char[employer] fundraiser.   Immunization History  Administered Date(s) Administered  . Fluad Quad(high Dose 65+) 05/20/2020  . Influenza, High Dose Seasonal PF 06/09/2017  . PFIZER(Purple Top)SARS-COV-2 Vaccination 08/29/2019, 09/19/2019, 07/11/2020     Objective: Vital Signs: BP (!) 164/84 (BP Location: Left Arm, Patient Position: Sitting, Cuff Size: Normal)   Pulse 86   Resp 14   Ht 5\' 3"  (1.6 m)   Wt 134 lb 3.2 oz (60.9 kg)   BMI 23.77 kg/m    Physical Exam Vitals and nursing note reviewed.  Constitutional:      Appearance: She is well-developed.  HENT:     Head: Normocephalic and atraumatic.  Eyes:     Conjunctiva/sclera: Conjunctivae normal.  Cardiovascular:     Rate and Rhythm: Normal rate and regular rhythm.     Heart sounds: Normal heart sounds.  Pulmonary:     Effort: Pulmonary effort is normal.     Breath sounds: Normal breath sounds.  Abdominal:     General: Bowel sounds are normal.     Palpations: Abdomen is soft.  Musculoskeletal:     Cervical back: Normal range of motion.  Skin:    General: Skin is warm and dry.     Capillary Refill: Capillary refill takes less than 2 seconds.  Neurological:     Mental Status: She is alert and oriented to  person, place, and time.  Psychiatric:        Behavior: Behavior normal.     Musculoskeletal Exam: C-spine has limited ROM with lateral rotation.  No midline spinal tenderness or SI joint tenderness.  Shoulder joints, elbow joints, and wrist joints have good ROM with no discomfort. Tenderness and mild inflammation of right 2nd MCP and left 3rd MCP.  Complete fist formation bilaterally.  Hip joints have good ROM with no groin pain.   Knee joints have good ROM with no warmth or effusion.  Ankle joints have good ROM with no tenderness or joint swelling.  No tenderness or synovitis of MTP joints.    CDAI Exam: CDAI Score: -- Patient Global: --; Provider Global: -- Swollen: --; Tender: -- Joint Exam 01/18/2022   No joint exam has been documented for this visit   There is currently no  information documented on the homunculus. Go to the Rheumatology activity and complete the homunculus joint exam.  Investigation: No additional findings.  Imaging: No results found.  Recent Labs: Lab Results  Component Value Date   WBC 3.9 12/21/2021   HGB 12.5 12/21/2021   PLT 258 12/21/2021   NA 143 11/13/2021   K 4.4 11/13/2021   CL 109 11/13/2021   CO2 26 11/13/2021   GLUCOSE 91 11/13/2021   BUN 10 11/13/2021   CREATININE 0.75 11/13/2021   BILITOT 0.3 11/13/2021   ALKPHOS 100 05/29/2021   AST 17 11/13/2021   ALT 12 11/13/2021   PROT 6.4 11/13/2021   ALBUMIN 3.5 05/29/2021   CALCIUM 9.5 11/13/2021   GFRAA 100 09/03/2020   QFTBGOLDPLUS NEGATIVE 08/07/2021    Speciality Comments: PPD- 07/08/17 Negative, Humira started August 06, 2020-inadequate response (last dose 12/24/20), Enbrel started 01/15/21 (stopped 06/17/21), Actemra started 06/24/21,MTX-high Cr 11/21  Procedures:  No procedures performed Allergies: Sulfa antibiotics     Assessment / Plan:     Visit Diagnoses: Rheumatoid arthritis, seronegative, multiple sites [facility]): She has noticed a significant improvement in her joint pain and  inflammation since her last office visit on 11/18/2021.  She was prescribed a prednisone taper starting at 20 mg tapering by 5 mg every week.  She completed the prescription for prednisone on 01/10/2022.  She is able to make a complete fist which is a huge improvement.  Her morning stiffness has been lasting for about 1 hour daily.  She has not had any nocturnal pain.  She has had less difficulty performing ADLs.  She would like to remain on low-dose prednisone for now and reassess at her follow-up visit.  Discussed that the goal is to have her completely taper off of prednisone to limit risks of long-term prednisone use.  She is aware of the potential risks of long-term prednisone use. Discussed the importance of avoiding NSAID use while concurrently taking prednisone.   She will remain on actemra 162 mg sq injections once every 14 days and arava 20 mg 1 tablet by mouth daily.   She will follow up in the office in 2 months or sooner if needed.  High risk medication use - Actemra 162 mg sq injections every 14 days (initiated on 06/24/2021) and  Arava 20 mg 1 tablet by mouth daily.  Prednisone 5 mg 1 tablet daily.  We will discuss tapering the dose of prednisone at her follow up visit if possible.  CBC and CMP drawn on 11/13/21. CBC updated on 12/21/21. She will return for lab work at the end of August and every 3 months to monitor for drug toxicity.  Standing orders for CBC and CMP remain in place. TB gold negative on 08/07/21 and will continue to be monitored yearly.  Discussed the importance of holding actemra and arava if she develops signs or symptoms of an infection and to resume once the infection has completely cleared.   Primary osteoarthritis of both hands: She has PIP and DIP thickening consistent with osteoarthritis of both hands.   Primary osteoarthritis of both knees: She has good ROM of both knee joints with no discomfort.  No warmth or effusion of knee joints.    Primary osteoarthritis of both  feet: She has good ROM of both ankle joints with no discomfort.  No tenderness or swelling of ankle joints.   Spondylosis without myelopathy or radiculopathy, cervical region: She has limited ROM of the C-spine.   Age-related osteoporosis without current pathological  fracture - 11/02/19: DEXA showed T score of -3.4 in the lumbar region.  Fosamax 70 mg p.o. weekly was a started in May 2021.  She continues to take Fosamax 70 mg 1 tablet by mouth once weekly for management of osteoporosis.  She has been more compliant taking Fosamax on a weekly basis as prescribed.  She is taking vitamin D 2000 units daily.  She has not had any falls or fractures.  History of vitamin D deficiency: She is taking vitamin D 2000 units daily.   Other medical conditions are listed as follows:   Abnormal SPEP - Recent lab work updated by Dr. Bertis Ruddy on 05/29/21.  No evidence of monoclonal protein detected on 05/29/21.   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 Bertis Ruddy and ENT. S/P CTX and RTX.  Post-menopausal  Orders: No orders of the defined types were placed in this encounter.  Meds ordered this encounter  Medications  . predniSONE (DELTASONE) 5 MG tablet    Sig: Take 1 tablet (5 mg total) by mouth daily with breakfast.    Dispense:  30 tablet    Refill:  1      Follow-Up Instructions: Return in about 2 months (around 03/20/2022).   Gearldine Bienenstock, PA-C  Note - This record has been created using Dragon software.  Chart creation errors have been sought, but may not always  have been located. Such creation errors do not reflect on  the standard of medical care.

## 2022-01-18 ENCOUNTER — Encounter: Payer: Self-pay | Admitting: Physician Assistant

## 2022-01-18 ENCOUNTER — Ambulatory Visit: Payer: Medicare Other | Attending: Physician Assistant | Admitting: Physician Assistant

## 2022-01-18 VITALS — BP 164/84 | HR 86 | Resp 14 | Ht 63.0 in | Wt 134.2 lb

## 2022-01-18 DIAGNOSIS — M19041 Primary osteoarthritis, right hand: Secondary | ICD-10-CM | POA: Diagnosis not present

## 2022-01-18 DIAGNOSIS — M170 Bilateral primary osteoarthritis of knee: Secondary | ICD-10-CM | POA: Diagnosis not present

## 2022-01-18 DIAGNOSIS — M19071 Primary osteoarthritis, right ankle and foot: Secondary | ICD-10-CM

## 2022-01-18 DIAGNOSIS — Z79899 Other long term (current) drug therapy: Secondary | ICD-10-CM

## 2022-01-18 DIAGNOSIS — M810 Age-related osteoporosis without current pathological fracture: Secondary | ICD-10-CM

## 2022-01-18 DIAGNOSIS — M0609 Rheumatoid arthritis without rheumatoid factor, multiple sites: Secondary | ICD-10-CM | POA: Diagnosis not present

## 2022-01-18 DIAGNOSIS — M19042 Primary osteoarthritis, left hand: Secondary | ICD-10-CM

## 2022-01-18 DIAGNOSIS — Z8639 Personal history of other endocrine, nutritional and metabolic disease: Secondary | ICD-10-CM

## 2022-01-18 DIAGNOSIS — R778 Other specified abnormalities of plasma proteins: Secondary | ICD-10-CM

## 2022-01-18 DIAGNOSIS — Z862 Personal history of diseases of the blood and blood-forming organs and certain disorders involving the immune mechanism: Secondary | ICD-10-CM

## 2022-01-18 DIAGNOSIS — Z780 Asymptomatic menopausal state: Secondary | ICD-10-CM

## 2022-01-18 DIAGNOSIS — M19072 Primary osteoarthritis, left ankle and foot: Secondary | ICD-10-CM

## 2022-01-18 DIAGNOSIS — M47812 Spondylosis without myelopathy or radiculopathy, cervical region: Secondary | ICD-10-CM

## 2022-01-18 DIAGNOSIS — C320 Malignant neoplasm of glottis: Secondary | ICD-10-CM

## 2022-01-18 MED ORDER — PREDNISONE 5 MG PO TABS: 5.0000 mg | ORAL_TABLET | Freq: Every day | ORAL | 1 refills | Status: DC

## 2022-01-18 NOTE — Patient Instructions (Signed)
Standing Labs ?We placed an order today for your standing lab work.  ? ?Please have your standing labs drawn in August and every 3 months  ? ?If possible, please have your labs drawn 2 weeks prior to your appointment so that the provider can discuss your results at your appointment. ? ?Please note that you may see your imaging and lab results in MyChart before we have reviewed them. ?We may be awaiting multiple results to interpret others before contacting you. ?Please allow our office up to 72 hours to thoroughly review all of the results before contacting the office for clarification of your results. ? ?We have open lab daily: ?Monday through Thursday from 1:30-4:30 PM and Friday from 1:30-4:00 PM ?at the office of Dr. Shaili Deveshwar, [facility].   ?Please be advised, all patients with office appointments requiring lab work will take precedent over walk-in lab work.  ?If possible, please come for your lab work on Monday and Friday afternoons, as you may experience shorter wait times. ?The office is located at [address], [address], [place], [place] [address] ?No appointment is necessary.   ?Labs are drawn by [facility]. Please bring your co-pay at the time of your lab draw.  You may receive a bill from [facility] for your lab work. ? ?Please note if you are on Hydroxychloroquine and and an order has been placed for a Hydroxychloroquine level, you will need to have it drawn 4 hours or more after your last dose. ? ?If you wish to have your labs drawn at another location, please call the office 24 hours in advance to send orders. ? ?If you have any questions regarding directions or hours of operation,  ?please call [phone].   ?As a reminder, please drink plenty of water prior to coming for your lab work. Thanks! ? ?

## 2022-01-19 ENCOUNTER — Other Ambulatory Visit: Payer: Self-pay | Admitting: Rheumatology

## 2022-01-19 DIAGNOSIS — Z79899 Other long term (current) drug therapy: Secondary | ICD-10-CM

## 2022-01-19 DIAGNOSIS — M0609 Rheumatoid arthritis without rheumatoid factor, multiple sites: Secondary | ICD-10-CM

## 2022-01-19 MED ORDER — ACTEMRA ACTPEN 162 MG/0.9ML ~~LOC~~ SOAJ: 162.0000 mg | SUBCUTANEOUS | 0 refills | Status: DC

## 2022-01-19 NOTE — Telephone Encounter (Signed)
Next Visit: 03/22/2022  Last Visit: 01/18/2022  Last Fill: 09/24/2021  KE:[id number] arthritis, seronegative, multiple sites   Current Dose per office note 01/18/2022: Actemra 162 mg sq injections every 14 days   Labs: 12/21/2021 White cell count is normal now. 11/13/2021  CMP is normal.  TB Gold: 08/07/2021   Okay to refill Actemra?

## 2022-01-19 NOTE — Telephone Encounter (Signed)
Patient called the office requesting a refill of Actemra '162mg'$ /0.58m be sent to [facility].

## 2022-03-02 ENCOUNTER — Other Ambulatory Visit: Payer: Self-pay | Admitting: Physician Assistant

## 2022-03-02 NOTE — Telephone Encounter (Signed)
Next Visit: 03/22/2022   Last Visit: 01/18/2022   Last Fill: 11/05/2021  DX:Age-related osteoporosis without current pathological fracture    Current Dose per office note 01/18/2022: Fosamax 70 mg p.o. weekly   Labs: 12/21/2021 White cell count is normal now. 11/13/2021  CMP is normal.  Okay to refill Fosamax?

## 2022-03-08 NOTE — Progress Notes (Deleted)
Office Visit Note  Patient: Jean [name]             Date of Birth: 1945-06-28           MRN: [id number]             PCP: [name], MD Referring: [name], MD Visit Date: 03/22/2022 Occupation: @GUAROCC @  Subjective:    History of Present Illness: Jean [name] is a 76 y.o. female with history of seronegative rheumatoid arthritis, osteoarthritis, and osteoporosis. She remains on Actemra 162 mg sq injections every 14 days (initiated on 06/24/2021) and  Arava 20 mg 1 tablet by mouth daily. She remains on Prednisone 5 mg 1 tablet daily.   CBC and CMP updated on 03/18/22. Her next lab work will be due in December and every 3 months. Standing orders for CBC and CMP remain in place. TB gold negative on 08/07/21.   Discussed the importance of holding actemra and arava if she develops signs or symptoms of an infection and to resume once the infection has completely cleared.   Activities of Daily Living:  Patient reports morning stiffness for *** {minute/hour:19697}.   Patient {ACTIONS;DENIES/REPORTS:21021675::"Denies"} nocturnal pain.  Difficulty dressing/grooming: {ACTIONS;DENIES/REPORTS:21021675::"Denies"} Difficulty climbing stairs: {ACTIONS;DENIES/REPORTS:21021675::"Denies"} Difficulty getting out of chair: {ACTIONS;DENIES/REPORTS:21021675::"Denies"} Difficulty using hands for taps, buttons, cutlery, and/or writing: {ACTIONS;DENIES/REPORTS:21021675::"Denies"}  No Rheumatology ROS completed.   PMFS History:  Patient Active Problem List   Diagnosis Date Noted   Osteoporosis 02/27/2021   Essential hypertension 02/27/2021   Gastritis 09/08/2020   Iron deficiency anemia 09/08/2020   Preventive measure 05/20/2020   MGUS (monoclonal gammopathy of unknown significance) 12/25/2019   Acquired hypothyroidism 12/25/2019   Protein malnutrition (HCC) 05/16/2019   Constipation due to opioid therapy 05/09/2019   Port-A-Cath in place 05/02/2019   Squamous cell carcinoma  of left vocal cord (HCC) 04/06/2019   Vitamin D deficiency 01/23/2017   Rheumatoid arthritis, seronegative, multiple sites (HCC) 01/19/2017   High risk medication use 01/19/2017   Primary osteoarthritis of both hands 01/19/2017   Primary osteoarthritis of both knees 01/19/2017   Fatigue 01/19/2017   Thrombocytosis 01/19/2017   Primary osteoarthritis of both feet 01/19/2017   Deficiency anemia 01/19/2017   Screening-pulmonary TB 04/26/2016    Past Medical History:  Diagnosis Date   Arthritis    Cancer (HCC)    History of radiation therapy 05/03/19- 06/25/19   Larynx 35 fractions of 2 Gy each to total 70 Gy   Hypertension    Rheumatoid arthritis (HCC)    Vocal cord mass     Family History  Problem Relation Age of Onset   Stroke Mother    Hypertension Daughter    Colon polyps Neg Hx    Colon cancer Neg Hx    Past Surgical History:  Procedure Laterality Date   ECTOPIC PREGNANCY SURGERY     EXTERNAL EAR SURGERY Right    removal of cyst or gland   IR GASTROSTOMY TUBE MOD SED  04/26/2019   IR GASTROSTOMY TUBE REMOVAL  11/05/2019   IR IMAGING GUIDED PORT INSERTION  04/26/2019   IR REMOVAL TUN ACCESS W/ PORT W/O FL MOD SED  11/05/2019   MICROLARYNGOSCOPY Left 03/26/2019   Procedure: DIRECT MICROLARYNGOSCOPY WITH BIOPSY OF VOCAL CORD MASS;  Surgeon: Newman Pies, MD;  Location: [facility];  Service: ENT;  Laterality: Left;   Social History   Social History Narrative   Patient was recently widowed in March 2020.   Patient has 1 daughter who lives  in [place], [place]   Patient moved to [place] from [place] in January 2009.   Patient is a retired Char[employer] fundraiser.   Immunization History  Administered Date(s) Administered   Fluad Quad(high Dose 65+) 05/20/2020   Influenza, High Dose Seasonal PF 06/09/2017   PFIZER(Purple Top)SARS-COV-2 Vaccination 08/29/2019, 09/19/2019, 07/11/2020     Objective: Vital Signs: There were no vitals taken for this visit.   Physical  Exam Vitals and nursing note reviewed.  Constitutional:      Appearance: She is well-developed.  HENT:     Head: Normocephalic and atraumatic.  Eyes:     Conjunctiva/sclera: Conjunctivae normal.  Cardiovascular:     Rate and Rhythm: Normal rate and regular rhythm.     Heart sounds: Normal heart sounds.  Pulmonary:     Effort: Pulmonary effort is normal.     Breath sounds: Normal breath sounds.  Abdominal:     General: Bowel sounds are normal.     Palpations: Abdomen is soft.  Musculoskeletal:     Cervical back: Normal range of motion.  Skin:    General: Skin is warm and dry.     Capillary Refill: Capillary refill takes less than 2 seconds.  Neurological:     Mental Status: She is alert and oriented to person, place, and time.  Psychiatric:        Behavior: Behavior normal.      Musculoskeletal Exam: ***  CDAI Exam: CDAI Score: -- Patient Global: --; Provider Global: -- Swollen: --; Tender: -- Joint Exam 03/22/2022   No joint exam has been documented for this visit   There is currently no information documented on the homunculus. Go to the Rheumatology activity and complete the homunculus joint exam.  Investigation: No additional findings.  Imaging: No results found.  Recent Labs: Lab Results  Component Value Date   WBC 3.9 12/21/2021   HGB 12.5 12/21/2021   PLT 258 12/21/2021   NA 143 11/13/2021   K 4.4 11/13/2021   CL 109 11/13/2021   CO2 26 11/13/2021   GLUCOSE 91 11/13/2021   BUN 10 11/13/2021   CREATININE 0.75 11/13/2021   BILITOT 0.3 11/13/2021   ALKPHOS 100 05/29/2021   AST 17 11/13/2021   ALT 12 11/13/2021   PROT 6.4 11/13/2021   ALBUMIN 3.5 05/29/2021   CALCIUM 9.5 11/13/2021   GFRAA 100 09/03/2020   QFTBGOLDPLUS NEGATIVE 08/07/2021    Speciality Comments: PPD- 07/08/17 Negative, Humira started August 06, 2020-inadequate response (last dose 12/24/20), Enbrel started 01/15/21 (stopped 06/17/21), Actemra started 06/24/21,MTX-high Cr  11/21  Procedures:  No procedures performed Allergies: Sulfa antibiotics   Assessment / Plan:     Visit Diagnoses: No diagnosis found.  Orders: No orders of the defined types were placed in this encounter.  No orders of the defined types were placed in this encounter.   Face-to-face time spent with patient was *** minutes. Greater than 50% of time was spent in counseling and coordination of care.  Follow-Up Instructions: No follow-ups on file.   Ellen Henri, CMA  Note - This record has been created using Animal nutritionist.  Chart creation errors have been sought, but may not always  have been located. Such creation errors do not reflect on  the standard of medical care.

## 2022-03-18 ENCOUNTER — Other Ambulatory Visit: Payer: Self-pay | Admitting: *Deleted

## 2022-03-18 DIAGNOSIS — Z79899 Other long term (current) drug therapy: Secondary | ICD-10-CM

## 2022-03-19 LAB — CBC WITH DIFFERENTIAL/PLATELET
Absolute Monocytes: 503 cells/uL (ref 200–950)
Basophils Absolute: 31 cells/uL (ref 0–200)
Basophils Relative: 0.8 %
Eosinophils Absolute: 20 cells/uL (ref 15–500)
Eosinophils Relative: 0.5 %
HCT: 39.9 % (ref 35.0–45.0)
Hemoglobin: 13.5 g/dL (ref 11.7–15.5)
Lymphs Abs: 593 cells/uL — ABNORMAL LOW (ref 850–3900)
MCH: 34.6 pg — ABNORMAL HIGH (ref 27.0–33.0)
MCHC: 33.8 g/dL (ref 32.0–36.0)
MCV: 102.3 fL — ABNORMAL HIGH (ref 80.0–100.0)
MPV: 10 fL (ref 7.5–12.5)
Monocytes Relative: 12.9 %
Neutro Abs: 2753 cells/uL (ref 1500–7800)
Neutrophils Relative %: 70.6 %
Platelets: 293 10*3/uL (ref 140–400)
RBC: 3.9 10*6/uL (ref 3.80–5.10)
RDW: 11.8 % (ref 11.0–15.0)
Total Lymphocyte: 15.2 %
WBC: 3.9 10*3/uL (ref 3.8–10.8)

## 2022-03-19 LAB — COMPLETE METABOLIC PANEL WITH GFR
AG Ratio: 2.1 (calc) (ref 1.0–2.5)
ALT: 16 U/L (ref 6–29)
AST: 15 U/L (ref 10–35)
Albumin: 4.5 g/dL (ref 3.6–5.1)
Alkaline phosphatase (APISO): 60 U/L (ref 37–153)
BUN: 15 mg/dL (ref 7–25)
CO2: 26 mmol/L (ref 20–32)
Calcium: 10.1 mg/dL (ref 8.6–10.4)
Chloride: 109 mmol/L (ref 98–110)
Creat: 0.93 mg/dL (ref 0.60–1.00)
Globulin: 2.1 g/dL (calc) (ref 1.9–3.7)
Glucose, Bld: 122 mg/dL — ABNORMAL HIGH (ref 65–99)
Potassium: 4.6 mmol/L (ref 3.5–5.3)
Sodium: 142 mmol/L (ref 135–146)
Total Bilirubin: 0.3 mg/dL (ref 0.2–1.2)
Total Protein: 6.6 g/dL (ref 6.1–8.1)
eGFR: 64 mL/min/{1.73_m2} (ref >=60)

## 2022-03-22 ENCOUNTER — Telehealth: Payer: Self-pay | Admitting: *Deleted

## 2022-03-22 ENCOUNTER — Ambulatory Visit: Payer: Medicare Other | Attending: Physician Assistant | Admitting: Physician Assistant

## 2022-03-22 DIAGNOSIS — Z79899 Other long term (current) drug therapy: Secondary | ICD-10-CM

## 2022-03-22 DIAGNOSIS — Z862 Personal history of diseases of the blood and blood-forming organs and certain disorders involving the immune mechanism: Secondary | ICD-10-CM

## 2022-03-22 DIAGNOSIS — M19071 Primary osteoarthritis, right ankle and foot: Secondary | ICD-10-CM

## 2022-03-22 DIAGNOSIS — Z8639 Personal history of other endocrine, nutritional and metabolic disease: Secondary | ICD-10-CM

## 2022-03-22 DIAGNOSIS — M810 Age-related osteoporosis without current pathological fracture: Secondary | ICD-10-CM

## 2022-03-22 DIAGNOSIS — M19041 Primary osteoarthritis, right hand: Secondary | ICD-10-CM

## 2022-03-22 DIAGNOSIS — C320 Malignant neoplasm of glottis: Secondary | ICD-10-CM

## 2022-03-22 DIAGNOSIS — M170 Bilateral primary osteoarthritis of knee: Secondary | ICD-10-CM

## 2022-03-22 DIAGNOSIS — M47812 Spondylosis without myelopathy or radiculopathy, cervical region: Secondary | ICD-10-CM

## 2022-03-22 DIAGNOSIS — M0609 Rheumatoid arthritis without rheumatoid factor, multiple sites: Secondary | ICD-10-CM

## 2022-03-22 DIAGNOSIS — R778 Other specified abnormalities of plasma proteins: Secondary | ICD-10-CM

## 2022-03-22 DIAGNOSIS — Z780 Asymptomatic menopausal state: Secondary | ICD-10-CM

## 2022-03-22 NOTE — Progress Notes (Unsigned)
Office Visit Note  Patient: Jean [name]             Date of Birth: [birth year]           MRN: [id number]             PCP: [name], MD Referring: [name], MD Visit Date: 03/24/2022 Occupation: @GUAROCC @  Subjective:  Discussed prednisone taper  History of Present Illness: Jean [name] is a 76 y.o. female with history of seronegative rheumatoid arthritis, osteoarthritis, and osteoporosis. She remains on Actemra 162 mg sq injections every 14 days (initiated on 06/24/2021) and  Arava 20 mg 1 tablet by mouth daily.  Patient was last seen in the office on 01/18/2022 at which time the plan was for the patient to take prednisone 5 mg 1 tablet daily.  Starting September for she reduce prednisone to 5 mg every other day.  She would like to continue to try to taper off prednisone gradually.  Overall she has noticed about a 90 to 95% improvement in her joint pain, stiffness, and inflammation.  She has not been experiencing any morning stiffness and has been sleeping better at night.  She has not had any nocturnal pain.  Her energy level has improved.  The swelling in her hands has resolved.  She has been able to make a complete fist. She denies any recent or recurrent infections.  She is planning on getting the annual flu shot as well as the COVID booster. She continues to take Fosamax 70 mg 1 tablet by mouth once weekly.  She is due to update a bone density.   Activities of Daily Living:  Patient reports morning stiffness for a few minutes.   Patient Denies nocturnal pain.  Difficulty dressing/grooming: Denies Difficulty climbing stairs: Denies Difficulty getting out of chair: Denies Difficulty using hands for taps, buttons, cutlery, and/or writing: Denies  Review of Systems  Constitutional:  Negative for fatigue.  HENT:  Negative for mouth sores and mouth dryness.   Eyes:  Negative for dryness.  Respiratory:  Negative for shortness of breath.   Cardiovascular:   Negative for chest pain and palpitations.  Gastrointestinal:  Negative for blood in stool, constipation and diarrhea.  Endocrine: Negative for increased urination.  Genitourinary:  Negative for involuntary urination.  Musculoskeletal:  Positive for morning stiffness. Negative for joint pain, gait problem, joint pain, joint swelling, myalgias, muscle weakness, muscle tenderness and myalgias.  Skin:  Negative for color change, rash, hair loss and sensitivity to sunlight.  Allergic/Immunologic: Negative for susceptible to infections.  Neurological:  Negative for dizziness and headaches.  Hematological:  Negative for swollen glands.  Psychiatric/Behavioral:  Negative for depressed mood and sleep disturbance. The patient is not nervous/anxious.     PMFS History:  Patient Active Problem List   Diagnosis Date Noted  . Osteoporosis 02/27/2021  . Essential hypertension 02/27/2021  . Gastritis 09/08/2020  . Iron deficiency anemia 09/08/2020  . Preventive measure 05/20/2020  . MGUS (monoclonal gammopathy of unknown significance) 12/25/2019  . Acquired hypothyroidism 12/25/2019  . Protein malnutrition (HCC) 05/16/2019  . Constipation due to opioid therapy 05/09/2019  . Port-A-Cath in place 05/02/2019  . 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 Vitamin D deficiency 01/23/2017  . Rheumatoid arthritis, seronegative, multiple sites (HCC) 01/19/2017  . High risk medication use 01/19/2017  . Primary osteoarthritis of both hands 01/19/2017  . Primary osteoarthritis of both knees 01/19/2017  . Fatigue 01/19/2017  . Thrombocytosis 01/19/2017  .  Primary osteoarthritis of both feet 01/19/2017  . Deficiency anemia 01/19/2017  . Screening-pulmonary TB 04/26/2016    Past Medical History:  Diagnosis Date  . Arthritis   . Cancer (HCC)   . History of radiation therapy 05/03/19- 06/25/19   Larynx 35 fractions of 2 Gy each to total 70 Gy  . Hypertension   . Rheumatoid arthritis (HCC)    . Vocal cord mass     Family History  Problem Relation Age of Onset  . Stroke Mother   . Hypertension Daughter   . Colon polyps Neg Hx   . Colon cancer Neg Hx    Past Surgical History:  Procedure Laterality Date  . ECTOPIC PREGNANCY SURGERY    . EXTERNAL EAR SURGERY Right    removal of cyst or gland  . IR GASTROSTOMY TUBE MOD SED  04/26/2019  . IR GASTROSTOMY TUBE REMOVAL  11/05/2019  . IR IMAGING GUIDED PORT INSERTION  04/26/2019  . IR REMOVAL TUN ACCESS W/ PORT W/O FL MOD SED  11/05/2019  . MICROLARYNGOSCOPY Left 03/26/2019   Procedure: DIRECT MICROLARYNGOSCOPY WITH BIOPSY OF VOCAL CORD MASS;  Surgeon: Newman Pies, MD;  Location: [facility];  Service: ENT;  Laterality: Left;   Social History   Social History Narrative   Patient was recently widowed in March 2020.   Patient has 1 daughter who lives in [place]   Patient moved to [place] from [place] in January 2009.   Patient is a retired [employer] fund[employer].   Immunization History  Administered Date(s) Administered  . Fluad Quad(high Dose 65+) 05/20/2020  . Influenza, High Dose Seasonal PF 06/09/2017  . PFIZER(Purple Top)SARS-COV-2 Vaccination 08/29/2019, 09/19/2019, 07/11/2020     Objective: Vital Signs: BP (!) 160/95 (BP Location: Right Arm, Patient Position: Sitting, Cuff Size: Normal)   Pulse 81   Resp 13   Ht 5\' 3"  (1.6 m)   Wt 133 lb 9.6 oz (60.6 kg)   BMI 23.67 kg/m    Physical Exam Vitals and nursing note reviewed.  Constitutional:      Appearance: She is well-developed.  HENT:     Head: Normocephalic and atraumatic.  Eyes:     Conjunctiva/sclera: Conjunctivae normal.  Cardiovascular:     Rate and Rhythm: Normal rate and regular rhythm.     Heart sounds: Normal heart sounds.  Pulmonary:     Effort: Pulmonary effort is normal.     Breath sounds: Normal breath sounds.  Abdominal:     General: Bowel sounds are normal.     Palpations: Abdomen is soft.  Musculoskeletal:     Cervical  back: Normal range of motion.  Skin:    General: Skin is warm and dry.     Capillary Refill: Capillary refill takes less than 2 seconds.  Neurological:     Mental Status: She is alert and oriented to person, place, and time.  Psychiatric:        Behavior: Behavior normal.     Musculoskeletal Exam: C-spine has slightly limited range of motion with lateral rotation.  Some midline spinal tenderness in the lumbar region.  No SI joint tenderness upon palpation.  Shoulder joints have good range of motion with no discomfort.  Elbow joints have good range of motion with no tenderness or synovitis.  Wrist joints have good range of motion with no tenderness or synovitis.  No tenderness or synovitis over MCP joints.  Mild bogginess in the right second and third PIP joints but no tenderness upon  palpation.  Complete fist formation noted bilaterally.  Hip joints have good range of motion with no groin pain.  Knee joints have good range of motion with no warmth or effusion.  Ankle joints have good range of motion with no tenderness or joint swelling.  CDAI Exam: CDAI Score: -- Patient Global: 3 mm; Provider Global: 3 mm Swollen: --; Tender: -- Joint Exam 03/24/2022   No joint exam has been documented for this visit   There is currently no information documented on the homunculus. Go to the Rheumatology activity and complete the homunculus joint exam.  Investigation: No additional findings.  Imaging: No results found.  Recent Labs: Lab Results  Component Value Date   WBC 3.9 03/18/2022   HGB 13.5 03/18/2022   PLT 293 03/18/2022   NA 142 03/18/2022   K 4.6 03/18/2022   CL 109 03/18/2022   CO2 26 03/18/2022   GLUCOSE 122 (H) 03/18/2022   BUN 15 03/18/2022   CREATININE 0.93 03/18/2022   BILITOT 0.3 03/18/2022   ALKPHOS 100 05/29/2021   AST 15 03/18/2022   ALT 16 03/18/2022   PROT 6.6 03/18/2022   ALBUMIN 3.5 05/29/2021   CALCIUM 10.1 03/18/2022   GFRAA 100 09/03/2020   QFTBGOLDPLUS  NEGATIVE 08/07/2021    Speciality Comments: PPD- 07/08/17 Negative, Humira started August 06, 2020-inadequate response (last dose 12/24/20), Enbrel started 01/15/21 (stopped 06/17/21), Actemra started 06/24/21,MTX-high Cr 11/21  Procedures:  No procedures performed Allergies: Sulfa antibiotics      Assessment / Plan:     Visit Diagnoses: Rheumatoid arthritis, seronegative, multiple sites [facility]): Patient has noticed a 90 to 95% improvement in her joint pain, stiffness, and swelling on the current treatment regimen.  She has been able to make a complete fist for the first time in about a year.  She has not been experiencing any morning stiffness or nocturnal pain.  She is not having any difficulty with ADLs.  She is currently on Actemra 162 mg sq injections every 14 days, Arava 20 mg 1 tablet by mouth daily, and prednisone 5 mg 1 tablet every other day.  She has been tolerating combination therapy without any side effects or injection site reactions from Actemra.  At her last office visit on 01/18/2022 the plan was for her to take prednisone 5 mg once daily until her follow-up visit today.  Starting September for she has reduced the dose of prednisone to 5 mg every other day and has not noticed any new or worsening symptoms.  She was encouraged to try reducing prednisone to 2.5 mg every other day for the next 1 month and then to discontinue.  She will remain on Actemra and Arava as combination therapy.  She was advised to notify us if she develops any signs or symptoms of a flare.  She will follow-up in the office in 3 months or sooner if needed.  High risk medication use - Actemra 162 mg sq injections every 14 days (initiated on 06/24/2021) and  Arava 20 mg 1 tablet by mouth daily.  Prednisone 5 mg 1 tablet daily.  CBC and CMP updated on 03/18/22.  Absolute lymphocyte count was low. repeat CBC with diff in 1 month.  Future order for CBC with differential remains in place. TB gold negative on 08/05/21. She has  not had any recent or recurrent infections.  Discussed the importance of holding actemra and arava if she develops signs or symptoms of an infection and to resume once the infection has completely cleared.  Patient is aware of the risk of long term prednisone use.  She will try reducing prednisone to 2.5 mg every other day for the next 1 month and then will taper off completely.  Patient is aware of the risks of long-term prednisone use.  Primary osteoarthritis of both hands: She has PIP and DIP thickening consistent with osteoarthritis of both hands.  She has some bogginess of the right second and third MCP joints with synovial thickening.  Complete fist formation was noted bilaterally.  Discussed the importance of joint protection and muscle strengthening.  Primary osteoarthritis of both knees: She has good range of motion of both knee joints on examination today.  Some fullness in the left knee but no warmth or effusion was noted.  Primary osteoarthritis of both feet: She has not experiencing any increased discomfort in her feet at this time.  She has good range of motion of both ankle joints with no tenderness or synovitis.  No tenderness or synovitis over MTP joints.  Spondylosis without myelopathy or radiculopathy, cervical region: She has slightly limited range of motion with lateral rotation.  She experiences some stiffness and crepitus intermittently.  No symptoms of radiculopathy.  Age-related osteoporosis without current pathological fracture - 11/02/19: DEXA showed T score of -3.4 in the lumbar region.  History of prednisone use. Fosamax started May 2021. She is taking Fosamax 70 mg 1 tablet by mouth once weekly and vitamin D 2000 units daily.   Order for DEXA placed today. - Plan: DG BONE DENSITY (DXA)  History of vitamin D deficiency: She is taking vitamin D 2000 units daily.  Other medical conditions are listed as follows:  Abnormal SPEP - Recent lab work updated by Dr. Bertis Ruddy on  05/29/21.  No evidence of monoclonal protein detected on 05/29/21.   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 Bertis Ruddy and ENT. S/P CTX and RTX.  Post-menopausal  Orders: Orders Placed This Encounter  Procedures  . DG BONE DENSITY (DXA)   No orders of the defined types were placed in this encounter.   Follow-Up Instructions: Return in about 3 months (around 06/24/2022) for Rheumatoid arthritis, Osteoarthritis, Osteoporosis.   Gearldine Bienenstock, PA-C  Note - This record has been created using Dragon software.  Chart creation errors have been sought, but may not always  have been located. Such creation errors do not reflect on  the standard of medical care.

## 2022-03-22 NOTE — Telephone Encounter (Signed)
-----   Message from Ofilia Neas, PA-C sent at 03/19/2022  1:33 PM EDT ----- Glucose is 122. Rest of CMP WNL. MCV and MCH remain elevated but are trending down.   Absolute lymphocytes are low.   Rest of CBC WNL.  Recheck CBC with diff in 1 month.

## 2022-03-24 ENCOUNTER — Ambulatory Visit: Payer: Medicare Other | Attending: Physician Assistant | Admitting: Physician Assistant

## 2022-03-24 ENCOUNTER — Encounter: Payer: Self-pay | Admitting: Physician Assistant

## 2022-03-24 VITALS — BP 160/95 | HR 81 | Resp 13 | Ht 63.0 in | Wt 133.6 lb

## 2022-03-24 DIAGNOSIS — Z780 Asymptomatic menopausal state: Secondary | ICD-10-CM

## 2022-03-24 DIAGNOSIS — M0609 Rheumatoid arthritis without rheumatoid factor, multiple sites: Secondary | ICD-10-CM | POA: Diagnosis not present

## 2022-03-24 DIAGNOSIS — M19041 Primary osteoarthritis, right hand: Secondary | ICD-10-CM | POA: Diagnosis not present

## 2022-03-24 DIAGNOSIS — Z862 Personal history of diseases of the blood and blood-forming organs and certain disorders involving the immune mechanism: Secondary | ICD-10-CM

## 2022-03-24 DIAGNOSIS — M19071 Primary osteoarthritis, right ankle and foot: Secondary | ICD-10-CM

## 2022-03-24 DIAGNOSIS — M810 Age-related osteoporosis without current pathological fracture: Secondary | ICD-10-CM

## 2022-03-24 DIAGNOSIS — M19072 Primary osteoarthritis, left ankle and foot: Secondary | ICD-10-CM

## 2022-03-24 DIAGNOSIS — M170 Bilateral primary osteoarthritis of knee: Secondary | ICD-10-CM

## 2022-03-24 DIAGNOSIS — R778 Other specified abnormalities of plasma proteins: Secondary | ICD-10-CM

## 2022-03-24 DIAGNOSIS — Z79899 Other long term (current) drug therapy: Secondary | ICD-10-CM | POA: Diagnosis not present

## 2022-03-24 DIAGNOSIS — M47812 Spondylosis without myelopathy or radiculopathy, cervical region: Secondary | ICD-10-CM

## 2022-03-24 DIAGNOSIS — C320 Malignant neoplasm of glottis: Secondary | ICD-10-CM

## 2022-03-24 DIAGNOSIS — Z8639 Personal history of other endocrine, nutritional and metabolic disease: Secondary | ICD-10-CM

## 2022-03-24 DIAGNOSIS — M19042 Primary osteoarthritis, left hand: Secondary | ICD-10-CM

## 2022-03-24 NOTE — Patient Instructions (Signed)
Standing Labs We placed an order today for your standing lab work.   Please have your standing labs drawn at end of October and every 3 months    Please have your labs drawn 2 weeks prior to your appointment so that the provider can discuss your lab results at your appointment.  Please note that you may see your imaging and lab results in [place] before we have reviewed them. We will contact you once all results are reviewed. Please allow our office up to 72 hours to thoroughly review all of the results before contacting the office for clarification of your results.  Lab hours are: Monday through Thursday from 1:30 pm-4:30 pm and Friday from 1:30 pm- 4:00 pm  You may experience shorter wait times on Monday, Thursday or Friday afternoons,.   Effective April 19, 2022, new lab hours will be: Monday through Thursday from 8:00 am -12:30 pm and 1:00 pm-5:00 pm and Friday from 8:00 am-12:00 pm.  Please be advised, all patients with office appointments requiring lab work will take precedent over walk-in lab work.   Labs are drawn by [facility]. Please bring your co-pay at the time of your lab draw.  You may receive a bill from [facility] for your lab work.  Please note if you are on Hydroxychloroquine and and an order has been placed for a Hydroxychloroquine level, you will need to have it drawn 4 hours or more after your last dose.  If you wish to have your labs drawn at another location, please call the office 24 hours in advance so we can fax the orders.  The office is located at [address] [place], [place], [place] [address] No appointment is necessary.    If you have any questions regarding directions or hours of operation,  please call [phone].   As a reminder, please drink plenty of water prior to coming for your lab work. Thanks!

## 2022-04-07 ENCOUNTER — Telehealth: Payer: Self-pay | Admitting: *Deleted

## 2022-04-07 NOTE — Telephone Encounter (Signed)
Received DEXA results from [facility].  Date of Scan: 04/05/2022  Lowest T-score:-2.3  BMD:0.594  Lowest site measured:Right Femoral Neck  DX: Osteopenia  Significant changes in BMD and site measured (5% and above):8% Left Total Femur, 15 % AP Total Spine  Current Regimen:Fosamax, Vitamin D  Recommendation:Calcium Vitamin D and resistive exercises  Reviewed by:Dr. Bo Merino   Next Appointment:  06/24/2022  Attempted to contact the patient and left message for patient to call the office.

## 2022-04-19 ENCOUNTER — Other Ambulatory Visit: Payer: Self-pay | Admitting: *Deleted

## 2022-04-19 DIAGNOSIS — Z79899 Other long term (current) drug therapy: Secondary | ICD-10-CM

## 2022-04-19 DIAGNOSIS — M0609 Rheumatoid arthritis without rheumatoid factor, multiple sites: Secondary | ICD-10-CM

## 2022-04-19 MED ORDER — ACTEMRA ACTPEN 162 MG/0.9ML ~~LOC~~ SOAJ: 162.0000 mg | SUBCUTANEOUS | 0 refills | Status: DC

## 2022-04-19 NOTE — Telephone Encounter (Signed)
Patient called back for DEXA results.  Patient expressed verbal understanding of Dr. Arlean Hopping note and recommendations.

## 2022-04-19 NOTE — Telephone Encounter (Signed)
Refill request received via fax from [facility] for Actemra  Next Visit: 06/24/2022  Last Visit: 03/24/2022  Last Fill: 01/19/2022  TR:ZNBVAPOLID arthritis, seronegative, multiple sites   Current Dose per office note 03/24/2022: Actemra 162 mg sq injections every 14 days   Labs: 03/18/2022 Glucose is 122. Rest of CMP WNL. MCV and MCH remain elevated but are trending down.   Absolute lymphocytes are low.   Rest of CBC WNL.   TB Gold: 08/07/2021 Neg    Okay to refill Actemra?

## 2022-05-03 ENCOUNTER — Other Ambulatory Visit: Payer: Self-pay | Admitting: *Deleted

## 2022-05-03 MED ORDER — LEFLUNOMIDE 20 MG PO TABS: ORAL_TABLET | ORAL | 0 refills | Status: DC

## 2022-05-03 NOTE — Telephone Encounter (Signed)
Refill request received via fax from [facility] for Jean [name]   Next Visit: 06/24/2022  Last Visit: 03/24/2022  Last Fill: 12/21/2021  DX: Rheumatoid arthritis, seronegative, multiple sites    Current Dose per office note 03/24/2022: Arava 20 mg 1 tablet by mouth daily   Labs:  03/18/2022 Glucose is 122. Rest of CMP WNL. MCV and MCH remain elevated but are trending down.   Absolute lymphocytes are low.   Rest of CBC WNL.   Okay to refill Arava?

## 2022-05-26 ENCOUNTER — Other Ambulatory Visit: Payer: Self-pay | Admitting: Physician Assistant

## 2022-05-26 NOTE — Telephone Encounter (Signed)
Next Visit: 06/24/2022  Last Visit: 03/24/2022  Last Fill: 03/02/2022  DX: Age-related osteoporosis without current pathological fracture   Current Dose per office note on 03/24/2022: Fosamax 70 mg 1 tablet by mouth once weekly   Labs: 03/18/2022 Glucose is 122. Rest of CMP WNL. MCV and MCH remain elevated but are trending down.   Absolute lymphocytes are low.   Rest of CBC WNL.  Recheck CBC with diff in 1 month.  Attempted to contact patient and left message to advise her to recheck CBC.   Okay to refill fosamax?

## 2022-06-07 ENCOUNTER — Telehealth: Payer: Self-pay | Admitting: Pharmacist

## 2022-06-07 NOTE — Telephone Encounter (Signed)
Received fax from [place] regarding Actemra benefits reverification fo continued enrollment into patient assistance program. Spoke ith patient. Her household size, income, and insurance has and is anticipated to remain the same through 2024. Completed form and faxed to [place] with insurance card copy.  Fax: [phone] Phone: [phone]  Knox Saliva, PharmD, MPH, BCPS, CPP Clinical Pharmacist (Rheumatology and Pulmonology)

## 2022-06-10 NOTE — Progress Notes (Unsigned)
Office Visit Note  Patient: Jean [name]             Date of Birth: 02-Feb-1946           MRN: [id number]             PCP: [name], MD Referring: [name], MD Visit Date: 06/24/2022 Occupation: @GUAROCC @  Subjective:  Discuss DEXA results    History of Present Illness: Jean [name] is a 76 y.o. female with history of seronegative rheumatoid arthritis, osteoarthritis, and osteoporosis.  Patient remains on Actemra 162 mg sq injections every 14 days (initiated on 06/24/2021) and Arava 20 mg 1 tablet by mouth daily.   Prednisone 5 mg 1 tablet daily.   CBC and CMP were drawn on 03/18/2022.  Orders for CBC and CMP were released today.   TB Gold negative on 08/07/21.  Discussed the importance of holding Actemra and Arava if she develops signs or symptoms of an infection and to resume once the infection has completely cleared.  DEXA result from 04/05/2022: Right femoral neck BMD 0.594 with T-score -2.3.  +8 change in BMD of left total femur.  +15% change in BMD of AP total spine.  DEXA results were discussed with the patient today in the office.  Discussed the importance of taking calcium, vitamin D, and performing resistive exercises. She remains on Fosamax 70 mg 1 tablet by mouth once weekly.  Activities of Daily Living:  Patient reports morning stiffness for *** {minute/hour:19697}.   Patient {ACTIONS;DENIES/REPORTS:21021675::"Denies"} nocturnal pain.  Difficulty dressing/grooming: {ACTIONS;DENIES/REPORTS:21021675::"Denies"} Difficulty climbing stairs: {ACTIONS;DENIES/REPORTS:21021675::"Denies"} Difficulty getting out of chair: {ACTIONS;DENIES/REPORTS:21021675::"Denies"} Difficulty using hands for taps, buttons, cutlery, and/or writing: {ACTIONS;DENIES/REPORTS:21021675::"Denies"}  No Rheumatology ROS completed.   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) 04/06/2019   Vitamin D deficiency 01/23/2017   Rheumatoid arthritis, seronegative, multiple sites (HCC) 01/19/2017   High risk medication use 01/19/2017   Primary osteoarthritis of both hands 01/19/2017   Primary osteoarthritis of both knees 01/19/2017   Fatigue 01/19/2017   Thrombocytosis 01/19/2017   Primary osteoarthritis of both feet 01/19/2017   Deficiency anemia 01/19/2017   Screening-pulmonary TB 04/26/2016    Past Medical History:  Diagnosis Date   Arthritis    Cancer (HCC)    History of radiation therapy 05/03/19- 06/25/19   Larynx 35 fractions of 2 Gy each to total 70 Gy   Hypertension    Rheumatoid arthritis (HCC)    Vocal cord mass     Family History  Problem Relation Age of Onset   Stroke Mother    Hypertension Daughter    Colon polyps Neg Hx    Colon cancer Neg Hx    Past Surgical History:  Procedure Laterality Date   ECTOPIC PREGNANCY SURGERY     EXTERNAL EAR SURGERY Right    removal of cyst or gland   IR GASTROSTOMY TUBE MOD SED  04/26/2019   IR GASTROSTOMY TUBE REMOVAL  11/05/2019   IR IMAGING GUIDED PORT INSERTION  04/26/2019   IR REMOVAL TUN ACCESS W/ PORT W/O FL MOD SED  11/05/2019  MICROLARYNGOSCOPY Left 03/26/2019   Procedure: DIRECT MICROLARYNGOSCOPY WITH BIOPSY OF VOCAL CORD MASS;  Surgeon: Newman Pies, MD;  Location: [facility];  Service: ENT;  Laterality: Left;   Social History   Social History Narrative   Patient was recently widowed in March 2020.   Patient has 1 daughter who lives in [place]   Patient moved to [place] from [place] in January 2009.   Patient  is a retired Char[employer] fundraiser.   Immunization History  Administered Date(s) Administered   Fluad Quad(high Dose 65+) 05/20/2020   Influenza, High Dose Seasonal PF 06/09/2017   PFIZER(Purple Top)SARS-COV-2 Vaccination 08/29/2019, 09/19/2019, 07/11/2020     Objective: Vital Signs: There were no vitals taken for this visit.   Physical Exam Vitals and nursing note reviewed.  Constitutional:      Appearance: She is well-developed.  HENT:     Head: Normocephalic and atraumatic.  Eyes:     Conjunctiva/sclera: Conjunctivae normal.  Cardiovascular:     Rate and Rhythm: Normal rate and regular rhythm.     Heart sounds: Normal heart sounds.  Pulmonary:     Effort: Pulmonary effort is normal.     Breath sounds: Normal breath sounds.  Abdominal:     General: Bowel sounds are normal.     Palpations: Abdomen is soft.  Musculoskeletal:     Cervical back: Normal range of motion.  Skin:    General: Skin is warm and dry.     Capillary Refill: Capillary refill takes less than 2 seconds.  Neurological:     Mental Status: She is alert and oriented to person, place, and time.  Psychiatric:        Behavior: Behavior normal.      Musculoskeletal Exam: ***  CDAI Exam: CDAI Score: -- Patient Global: --; Provider Global: -- Swollen: --; Tender: -- Joint Exam 06/24/2022   No joint exam has been documented for this visit   There is currently no information documented on the homunculus. Go to the Rheumatology activity and complete the homunculus joint exam.  Investigation: No additional findings.  Imaging: No results found.  Recent Labs: Lab Results  Component Value Date   WBC 3.9 03/18/2022   HGB 13.5 03/18/2022   PLT 293 03/18/2022   NA 142 03/18/2022   K 4.6 03/18/2022   CL 109 03/18/2022   CO2 26 03/18/2022   GLUCOSE 122 (H) 03/18/2022   BUN 15 03/18/2022   CREATININE 0.93 03/18/2022   BILITOT 0.3 03/18/2022   ALKPHOS 100 05/29/2021   AST 15 03/18/2022   ALT 16 03/18/2022   PROT  6.6 03/18/2022   ALBUMIN 3.5 05/29/2021   CALCIUM 10.1 03/18/2022   GFRAA 100 09/03/2020   QFTBGOLDPLUS NEGATIVE 08/07/2021    Speciality Comments: PPD- 07/08/17 Negative, Humira started August 06, 2020-inadequate response (last dose 12/24/20), Enbrel started 01/15/21 (stopped 06/17/21), Actemra started 06/24/21,MTX-high Cr 11/21  Procedures:  No procedures performed Allergies: Sulfa antibiotics   Assessment / Plan:     Visit Diagnoses: No diagnosis found.  Orders: No orders of the defined types were placed in this encounter.  No orders of the defined types were placed in this encounter.   Face-to-face time spent with patient was *** minutes. Greater than 50% of time was spent in counseling and coordination of care.  Follow-Up Instructions: No follow-ups on file.   Ellen Henri, CMA  Note - This record has been created using Animal nutritionist.  Chart creation errors have been sought, but  may not always  have been located. Such creation errors do not reflect on  the standard of medical care.

## 2022-06-18 ENCOUNTER — Other Ambulatory Visit: Payer: Self-pay | Admitting: Physician Assistant

## 2022-06-23 ENCOUNTER — Other Ambulatory Visit: Payer: Self-pay | Admitting: *Deleted

## 2022-06-23 DIAGNOSIS — Z9225 Personal history of immunosupression therapy: Secondary | ICD-10-CM

## 2022-06-23 DIAGNOSIS — Z111 Encounter for screening for respiratory tuberculosis: Secondary | ICD-10-CM

## 2022-06-23 DIAGNOSIS — Z79899 Other long term (current) drug therapy: Secondary | ICD-10-CM

## 2022-06-24 ENCOUNTER — Ambulatory Visit: Payer: Medicare Other | Attending: Physician Assistant | Admitting: Physician Assistant

## 2022-06-24 ENCOUNTER — Encounter: Payer: Self-pay | Admitting: Physician Assistant

## 2022-06-24 VITALS — BP 168/82 | HR 97 | Resp 16 | Ht 64.0 in | Wt 135.6 lb

## 2022-06-24 DIAGNOSIS — M170 Bilateral primary osteoarthritis of knee: Secondary | ICD-10-CM

## 2022-06-24 DIAGNOSIS — R778 Other specified abnormalities of plasma proteins: Secondary | ICD-10-CM

## 2022-06-24 DIAGNOSIS — M0609 Rheumatoid arthritis without rheumatoid factor, multiple sites: Secondary | ICD-10-CM

## 2022-06-24 DIAGNOSIS — M810 Age-related osteoporosis without current pathological fracture: Secondary | ICD-10-CM

## 2022-06-24 DIAGNOSIS — Z862 Personal history of diseases of the blood and blood-forming organs and certain disorders involving the immune mechanism: Secondary | ICD-10-CM

## 2022-06-24 DIAGNOSIS — M19041 Primary osteoarthritis, right hand: Secondary | ICD-10-CM | POA: Diagnosis not present

## 2022-06-24 DIAGNOSIS — C320 Malignant neoplasm of glottis: Secondary | ICD-10-CM

## 2022-06-24 DIAGNOSIS — M19072 Primary osteoarthritis, left ankle and foot: Secondary | ICD-10-CM

## 2022-06-24 DIAGNOSIS — Z79899 Other long term (current) drug therapy: Secondary | ICD-10-CM | POA: Diagnosis not present

## 2022-06-24 DIAGNOSIS — M19042 Primary osteoarthritis, left hand: Secondary | ICD-10-CM

## 2022-06-24 DIAGNOSIS — M47812 Spondylosis without myelopathy or radiculopathy, cervical region: Secondary | ICD-10-CM

## 2022-06-24 DIAGNOSIS — Z8639 Personal history of other endocrine, nutritional and metabolic disease: Secondary | ICD-10-CM

## 2022-06-24 DIAGNOSIS — Z780 Asymptomatic menopausal state: Secondary | ICD-10-CM

## 2022-06-24 DIAGNOSIS — M19071 Primary osteoarthritis, right ankle and foot: Secondary | ICD-10-CM

## 2022-06-24 NOTE — Progress Notes (Signed)
CBC and CMP are stable.

## 2022-06-25 LAB — QUANTIFERON-TB GOLD PLUS
Mitogen-NIL: 3.02 IU/mL
NIL: 0.06 IU/mL
QuantiFERON-TB Gold Plus: NEGATIVE
TB1-NIL: <0 IU/mL
TB2-NIL: <0 IU/mL

## 2022-06-25 LAB — CBC WITH DIFFERENTIAL/PLATELET
Absolute Monocytes: 515 cells/uL (ref 200–950)
Basophils Absolute: 39 cells/uL (ref 0–200)
Basophils Relative: 1 %
Eosinophils Absolute: 59 cells/uL (ref 15–500)
Eosinophils Relative: 1.5 %
HCT: 41.6 % (ref 35.0–45.0)
Hemoglobin: 14.1 g/dL (ref 11.7–15.5)
Lymphs Abs: 823 cells/uL — ABNORMAL LOW (ref 850–3900)
MCH: 34.2 pg — ABNORMAL HIGH (ref 27.0–33.0)
MCHC: 33.9 g/dL (ref 32.0–36.0)
MCV: 101 fL — ABNORMAL HIGH (ref 80.0–100.0)
MPV: 11.1 fL (ref 7.5–12.5)
Monocytes Relative: 13.2 %
Neutro Abs: 2465 cells/uL (ref 1500–7800)
Neutrophils Relative %: 63.2 %
Platelets: 243 10*3/uL (ref 140–400)
RBC: 4.12 10*6/uL (ref 3.80–5.10)
RDW: 11.7 % (ref 11.0–15.0)
Total Lymphocyte: 21.1 %
WBC: 3.9 10*3/uL (ref 3.8–10.8)

## 2022-06-25 LAB — COMPLETE METABOLIC PANEL WITH GFR
AG Ratio: 2.3 (calc) (ref 1.0–2.5)
ALT: 19 U/L (ref 6–29)
AST: 17 U/L (ref 10–35)
Albumin: 4.6 g/dL (ref 3.6–5.1)
Alkaline phosphatase (APISO): 63 U/L (ref 37–153)
BUN: 12 mg/dL (ref 7–25)
CO2: 26 mmol/L (ref 20–32)
Calcium: 9.7 mg/dL (ref 8.6–10.4)
Chloride: 107 mmol/L (ref 98–110)
Creat: 0.95 mg/dL (ref 0.60–1.00)
Globulin: 2 g/dL (calc) (ref 1.9–3.7)
Glucose, Bld: 86 mg/dL (ref 65–99)
Potassium: 4.5 mmol/L (ref 3.5–5.3)
Sodium: 141 mmol/L (ref 135–146)
Total Bilirubin: 0.4 mg/dL (ref 0.2–1.2)
Total Protein: 6.6 g/dL (ref 6.1–8.1)
eGFR: 63 mL/min/{1.73_m2} (ref >=60)

## 2022-06-25 NOTE — Progress Notes (Signed)
TB Gold is negative.

## 2022-07-20 ENCOUNTER — Other Ambulatory Visit: Payer: Self-pay

## 2022-07-20 DIAGNOSIS — M0609 Rheumatoid arthritis without rheumatoid factor, multiple sites: Secondary | ICD-10-CM

## 2022-07-20 DIAGNOSIS — Z79899 Other long term (current) drug therapy: Secondary | ICD-10-CM

## 2022-07-20 MED ORDER — ACTEMRA ACTPEN 162 MG/0.9ML ~~LOC~~ SOAJ: 162.0000 mg | SUBCUTANEOUS | 0 refills | Status: DC

## 2022-07-20 NOTE — Telephone Encounter (Signed)
Received refill request via fax from [facility] for actemra.   Next Visit: 09/24/2022  Last Visit: 06/24/2022  Last Fill: 04/19/2022  DX: Rheumatoid arthritis, seronegative, multiple sites   Current Dose per office note on 06/24/2022: Actemra 162 mg sq injections every 14 days   Labs: 06/23/2022 CBC and CMP are stable.   TB Gold: 08/07/2021 negative    Okay to refill actemra?

## 2022-08-13 ENCOUNTER — Other Ambulatory Visit: Payer: Self-pay | Admitting: Physician Assistant

## 2022-08-13 NOTE — Telephone Encounter (Signed)
Next Visit: 09/24/2022   Last Visit: 06/24/2022   Last Fill: 05/03/2022  DX: Rheumatoid arthritis, seronegative, multiple sites    Current Dose per office note on 06/24/2022:  Labs: 06/23/2022 CBC and CMP are stable.    Okay to refill Arava?

## 2022-08-23 ENCOUNTER — Other Ambulatory Visit: Payer: Self-pay | Admitting: Rheumatology

## 2022-08-24 NOTE — Telephone Encounter (Signed)
Next Visit: 09/24/2022  Last Visit: 06/24/2022  Last Fill: 05/26/2022  DX: Age-related osteoporosis without current pathological fracture   Current Dose per office note 06/24/2022: Dose not discussed  Labs: 06/23/2022 CBC and CMP are stable.   No Vitamin D within the last 6 months. Okay to check at April follow up?  Okay to refill Fosamax?

## 2022-09-14 NOTE — Progress Notes (Signed)
Office Visit Note  Patient: Jean [name]             Date of Birth: 18-Jan-1946           MRN: [id number]             PCP: [name], MD Referring: [name], MD Visit Date: 09/24/2022 Occupation: @GUAROCC @  Subjective:  Medication monitoring   History of Present Illness: SHELTON SOLER is a 77 y.o. female with history of seronegative rheumatoid arthritis and osteoarthritis.  Patient remains on Actemra 162 mg sq injections every 14 days (initiated on 06/24/2021) and Arava 20 mg 1 tablet by mouth daily.  She is tolerating combination therapy without any side effects and has not had any injection site reactions from Actemra.  She has not had any signs or symptoms of a rheumatoid arthritis flare.  She has occasional stiffness in the left hip especially first thing in the morning but her symptoms are self resolving.  She has not noticed any joint swelling.  She has not needed to take prednisone or any over-the-counter products for symptomatic relief.  She has not been experiencing any nocturnal pain.  Her quality of life has improved.  She has not had any difficulty performing ADLs and has been able to do her hair without difficulty or assistance. She denies any recent or recurrent infections. She is been taking Fosamax 70 mg 1 tablet by mouth once weekly.  She denies any recent falls or fractures.   Activities of Daily Living:  Patient reports morning stiffness for 1 hour.   Patient Denies nocturnal pain.  Difficulty dressing/grooming: Denies Difficulty climbing stairs: Denies Difficulty getting out of chair: Denies Difficulty using hands for taps, buttons, cutlery, and/or writing: Denies  Review of Systems  Constitutional:  Positive for fatigue.  HENT:  Positive for mouth dryness. Negative for mouth sores.   Eyes:  Negative for dryness.  Respiratory:  Negative for shortness of breath.   Cardiovascular:  Negative for chest pain and palpitations.  Gastrointestinal:   Negative for blood in stool, constipation and diarrhea.  Endocrine: Negative for increased urination.  Genitourinary:  Negative for involuntary urination.  Musculoskeletal:  Positive for morning stiffness. Negative for joint pain, gait problem, joint pain, joint swelling, myalgias, muscle weakness, muscle tenderness and myalgias.  Skin:  Negative for color change, rash, hair loss and sensitivity to sunlight.  Allergic/Immunologic: Negative for susceptible to infections.  Neurological:  Negative for dizziness and headaches.  Hematological:  Negative for swollen glands.  Psychiatric/Behavioral:  Negative for depressed mood and sleep disturbance. The patient is not nervous/anxious.     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   Vitamin D deficiency 01/23/2017   Rheumatoid arthritis, seronegative, multiple sites 01/19/2017   High risk medication use 01/19/2017   Primary osteoarthritis of both hands 01/19/2017   Primary osteoarthritis of both knees 01/19/2017   Fatigue 01/19/2017   Thrombocytosis 01/19/2017   Primary osteoarthritis of both feet 01/19/2017   Deficiency anemia 01/19/2017   Screening-pulmonary TB 04/26/2016    Past Medical History:  Diagnosis  Date   Arthritis    Cancer    History of radiation therapy 05/03/19- 06/25/19   Larynx 35 fractions of 2 Gy each to total 70 Gy   Hypertension    Ingrown toenail    Rheumatoid arthritis    Vocal cord mass     Family History  Problem Relation Age of Onset   Stroke Mother     Hypertension Daughter    Colon polyps Neg Hx    Colon cancer Neg Hx    Past Surgical History:  Procedure Laterality Date   ECTOPIC PREGNANCY SURGERY     EXTERNAL EAR SURGERY Right    removal of cyst or gland   IR GASTROSTOMY TUBE MOD SED  04/26/2019   IR GASTROSTOMY TUBE REMOVAL  11/05/2019   IR IMAGING GUIDED PORT INSERTION  04/26/2019   IR REMOVAL TUN ACCESS W/ PORT W/O FL MOD SED  11/05/2019   MICROLARYNGOSCOPY Left 03/26/2019   Procedure: DIRECT MICROLARYNGOSCOPY WITH BIOPSY OF VOCAL CORD MASS;  Surgeon: Newman Pieseoh, Su, MD;  Location: [facility];  Service: ENT;  Laterality: Left;   Social History   Social History Narrative   Patient was recently widowed in March 2020.   Patient has 1 daughter who lives in [place]   Patient moved to [place] from [place] in January 2009.   Patient is a retired Charity fundraiserN.   Immunization History  Administered Date(s) Administered   Fluad Quad(high Dose 65+) 05/20/2020   Influenza, High Dose Seasonal PF 06/09/2017   PFIZER(Purple Top)SARS-COV-2 Vaccination 08/29/2019, 09/19/2019, 07/11/2020     Objective: Vital Signs: BP (!) 150/77 (BP Location: Right Arm, Patient Position: Sitting, Cuff Size: Small)   Pulse 85   Resp 12   Ht 5\' 3"  (1.6 m)   Wt 137 lb (62.1 kg)   BMI 24.27 kg/m    Physical Exam Vitals and nursing note reviewed.  Constitutional:      Appearance: She is well-developed.  HENT:     Head: Normocephalic and atraumatic.  Eyes:     Conjunctiva/sclera: Conjunctivae normal.  Cardiovascular:     Rate and Rhythm: Normal rate and regular rhythm.     Heart sounds: Normal heart sounds.  Pulmonary:     Effort: Pulmonary effort is normal.     Breath sounds: Normal breath sounds.  Abdominal:     General: Bowel sounds are normal.     Palpations: Abdomen is soft.  Musculoskeletal:     Cervical back: Normal range of motion.  Lymphadenopathy:     Cervical: No cervical adenopathy.  Skin:    General: Skin is  warm and dry.     Capillary Refill: Capillary refill takes less than 2 seconds.  Neurological:     Mental Status: She is alert and oriented to person, place, and time.  Psychiatric:        Behavior: Behavior normal.      Musculoskeletal Exam: C-spine has slightly limited Rom.  Thoracic spine and lumbar spine have good range of motion.  No midline spinal tenderness.  Shoulder joints, elbow joints, wrist joints, MCPs, PIPs, DIPs have good range of motion with no synovitis.  Complete fist formation bilaterally.  Hip joints have good range of motion with some discomfort in the left hip.  Knee joints have good range of motion with no warmth or effusion.  Ankle joints have good range of motion with no tenderness or synovitis.  CDAI Exam: CDAI Score: -- Patient Global: 2 mm; Provider Global: 2  mm Swollen: --; Tender: -- Joint Exam 09/24/2022   No joint exam has been documented for this visit   There is currently no information documented on the homunculus. Go to the Rheumatology activity and complete the homunculus joint exam.  Investigation: No additional findings.  Imaging: No results found.  Recent Labs: Lab Results  Component Value Date   WBC 3.4 (L) 09/22/2022   HGB 13.2 09/22/2022   PLT 260 09/22/2022   NA 140 09/22/2022   K 4.5 09/22/2022   CL 107 09/22/2022   CO2 25 09/22/2022   GLUCOSE 112 (H) 09/22/2022   BUN 9 09/22/2022   CREATININE 0.91 09/22/2022   BILITOT 0.3 09/22/2022   ALKPHOS 100 05/29/2021   AST 18 09/22/2022   ALT 16 09/22/2022   PROT 6.4 09/22/2022   ALBUMIN 3.5 05/29/2021   CALCIUM 9.8 09/22/2022   GFRAA 100 09/03/2020   QFTBGOLDPLUS NEGATIVE 06/23/2022    Speciality Comments: PPD- 07/08/17 Negative, Humira started August 06, 2020-inadequate response (last dose 12/24/20), Enbrel started 01/15/21 (stopped 06/17/21), Actemra started 06/24/21,MTX-high Cr 11/21  Procedures:  No procedures performed Allergies: Sulfa antibiotics     Assessment / Plan:      Visit Diagnoses: Rheumatoid arthritis, seronegative, multiple sites -She has no joint tenderness or synovitis on examination today.  She has not had any signs or symptoms of a rheumatoid arthritis flare.  She has clinically been doing well on Actemra 162 mg subcu injections every 14 days and Arava 20 mg 1 tablet by mouth daily.  She has not been experiencing any nocturnal pain or difficulty with ADLs.  She experiences some stiffness in the C-spine and left hip which improves with range of motion exercises.  She has not needed to take any over-the-counter products or prednisone recently.  Her quality of life has improved significantly on combination therapy.  She has not had any recent or recurrent infections. Association of heart disease with rheumatoid arthritis was discussed. Need to monitor blood pressure, cholesterol, and to exercise 30-60 minutes on daily basis was discussed.  Encouraged patient to increase her exercise level gradually. She will remain on Actemra and Arava as combination therapy.  She was advised to notify us if she develops signs or symptoms of a flare.  She will follow-up in the office in 4 months or sooner if needed.  Plan: Lipid panel  High risk medication use - Actemra 162 mg sq injections every 14 days (initiated on 06/24/2021) and  Arava 20 mg 1 tablet by mouth daily.  Discontinued prednisone in October 2023. CBC and CMP updated on 09/22/2022.  Her next lab work will be due in July and every 3 months to monitor for drug toxicity.  Standing orders for CBC and CMP remain in place. TB Gold negative on 06/23/2022. No recent or recurrent infections.  Discussed the importance of holding Actemra and Arava if she develops signs or symptoms of an infection and to resume once the infection has completely cleared. Future order for lipid panel placed today.  Patient plans on fasting prior to her next lab draw.  - Plan: Lipid panel  Lipid screening -Future order for lipid panel was placed  today.  She plans on fasting prior to her next lab draw.  Plan: Lipid panel  Primary osteoarthritis of both hands: She has PIP and DIP thickening consistent with osteoarthritis of both hands.  No tenderness or synovitis noted today.  Discussed the importance of joint protection and muscle strengthening.  Primary osteoarthritis of both knees:  She has good range of motion of both knee joints on examination today.  No warmth or effusion noted.  Primary osteoarthritis of both feet: She has not been experiencing any increased discomfort in her feet recently.  She has good range of motion of both ankle joints with no tenderness or synovitis.  She has been wearing proper fitting shoes.  Spondylosis without myelopathy or radiculopathy, cervical region: She experiences intermittent discomfort and stiffness in the C-spine.  She has slight limited range of motion with lateral rotation.  No symptoms of radiculopathy.  She is been performing extracting exercises daily.  Age-related osteoporosis without current pathological fracture - -DEXA result from 04/05/2022: Right femoral neck BMD 0.594 with T-score -2.3.  +8 change in BMD of left total femur.  +15% change in BMD of AP total spine. She is taking Fosamax 70 mg 1 tablet by mouth once weekly. Vitamin D was 40 on 09/22/2022.  She is taking vitamin D 2000 units daily. No recent falls or fractures. Update DEXA in October 2025.  History of vitamin D deficiency: Vitamin D was 40 on 09/22/2022.  She has been taking vitamin D 2000 units daily.  Other medical conditions are listed as follows:  Abnormal SPEP - Evaluated by Dr. Bertis Ruddy.  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 Bertis Ruddy and ENT. S/P CTX and RTX.  History of anemia     Orders: Orders Placed This Encounter  Procedures   Lipid panel   No orders of the defined types were placed in this encounter.    Follow-Up Instructions: Return in about 4 months (around 01/24/2023) for  Rheumatoid arthritis, Osteoarthritis.   Gearldine Bienenstock, PA-C  Note - This record has been created using Dragon software.  Chart creation errors have been sought, but may not always  have been located. Such creation errors do not reflect on  the standard of medical care.

## 2022-09-16 ENCOUNTER — Other Ambulatory Visit: Payer: Self-pay | Admitting: Physician Assistant

## 2022-09-22 ENCOUNTER — Other Ambulatory Visit: Payer: Self-pay | Admitting: *Deleted

## 2022-09-22 DIAGNOSIS — Z8639 Personal history of other endocrine, nutritional and metabolic disease: Secondary | ICD-10-CM

## 2022-09-22 DIAGNOSIS — Z79899 Other long term (current) drug therapy: Secondary | ICD-10-CM

## 2022-09-22 DIAGNOSIS — M810 Age-related osteoporosis without current pathological fracture: Secondary | ICD-10-CM

## 2022-09-23 LAB — CBC WITH DIFFERENTIAL/PLATELET
Absolute Monocytes: 500 cells/uL (ref 200–950)
Basophils Absolute: 31 cells/uL (ref 0–200)
Basophils Relative: 0.9 %
Eosinophils Absolute: 71 cells/uL (ref 15–500)
Eosinophils Relative: 2.1 %
HCT: 39.7 % (ref 35.0–45.0)
Hemoglobin: 13.2 g/dL (ref 11.7–15.5)
Lymphs Abs: 901 cells/uL (ref 850–3900)
MCH: 34 pg — ABNORMAL HIGH (ref 27.0–33.0)
MCHC: 33.2 g/dL (ref 32.0–36.0)
MCV: 102.3 fL — ABNORMAL HIGH (ref 80.0–100.0)
MPV: 10.5 fL (ref 7.5–12.5)
Monocytes Relative: 14.7 %
Neutro Abs: 1897 cells/uL (ref 1500–7800)
Neutrophils Relative %: 55.8 %
Platelets: 260 10*3/uL (ref 140–400)
RBC: 3.88 10*6/uL (ref 3.80–5.10)
RDW: 12.7 % (ref 11.0–15.0)
Total Lymphocyte: 26.5 %
WBC: 3.4 10*3/uL — ABNORMAL LOW (ref 3.8–10.8)

## 2022-09-23 LAB — VITAMIN D 25 HYDROXY (VIT D DEFICIENCY, FRACTURES): Vit D, 25-Hydroxy: 40 ng/mL (ref 30–100)

## 2022-09-23 LAB — COMPLETE METABOLIC PANEL WITH GFR
AG Ratio: 2.8 (calc) — ABNORMAL HIGH (ref 1.0–2.5)
ALT: 16 U/L (ref 6–29)
AST: 18 U/L (ref 10–35)
Albumin: 4.7 g/dL (ref 3.6–5.1)
Alkaline phosphatase (APISO): 57 U/L (ref 37–153)
BUN: 9 mg/dL (ref 7–25)
CO2: 25 mmol/L (ref 20–32)
Calcium: 9.8 mg/dL (ref 8.6–10.4)
Chloride: 107 mmol/L (ref 98–110)
Creat: 0.91 mg/dL (ref 0.60–1.00)
Globulin: 1.7 g/dL (calc) — ABNORMAL LOW (ref 1.9–3.7)
Glucose, Bld: 112 mg/dL — ABNORMAL HIGH (ref 65–99)
Potassium: 4.5 mmol/L (ref 3.5–5.3)
Sodium: 140 mmol/L (ref 135–146)
Total Bilirubin: 0.3 mg/dL (ref 0.2–1.2)
Total Protein: 6.4 g/dL (ref 6.1–8.1)
eGFR: 66 mL/min/{1.73_m2} (ref >=60)

## 2022-09-23 NOTE — Progress Notes (Signed)
White cell count is low due to immunosuppressive therapy.  CMP is normal.  Vitamin D is normal.

## 2022-09-24 ENCOUNTER — Ambulatory Visit: Payer: Medicare Other | Attending: Physician Assistant | Admitting: Physician Assistant

## 2022-09-24 ENCOUNTER — Encounter: Payer: Self-pay | Admitting: Physician Assistant

## 2022-09-24 ENCOUNTER — Other Ambulatory Visit: Payer: Self-pay | Admitting: *Deleted

## 2022-09-24 VITALS — BP 150/77 | HR 85 | Resp 12 | Ht 63.0 in | Wt 137.0 lb

## 2022-09-24 DIAGNOSIS — M19071 Primary osteoarthritis, right ankle and foot: Secondary | ICD-10-CM

## 2022-09-24 DIAGNOSIS — Z79899 Other long term (current) drug therapy: Secondary | ICD-10-CM | POA: Diagnosis not present

## 2022-09-24 DIAGNOSIS — M19041 Primary osteoarthritis, right hand: Secondary | ICD-10-CM

## 2022-09-24 DIAGNOSIS — R778 Other specified abnormalities of plasma proteins: Secondary | ICD-10-CM

## 2022-09-24 DIAGNOSIS — M0609 Rheumatoid arthritis without rheumatoid factor, multiple sites: Secondary | ICD-10-CM | POA: Diagnosis not present

## 2022-09-24 DIAGNOSIS — M47812 Spondylosis without myelopathy or radiculopathy, cervical region: Secondary | ICD-10-CM

## 2022-09-24 DIAGNOSIS — M810 Age-related osteoporosis without current pathological fracture: Secondary | ICD-10-CM

## 2022-09-24 DIAGNOSIS — M19072 Primary osteoarthritis, left ankle and foot: Secondary | ICD-10-CM

## 2022-09-24 DIAGNOSIS — M19042 Primary osteoarthritis, left hand: Secondary | ICD-10-CM

## 2022-09-24 DIAGNOSIS — Z862 Personal history of diseases of the blood and blood-forming organs and certain disorders involving the immune mechanism: Secondary | ICD-10-CM

## 2022-09-24 DIAGNOSIS — M170 Bilateral primary osteoarthritis of knee: Secondary | ICD-10-CM | POA: Diagnosis not present

## 2022-09-24 DIAGNOSIS — C320 Malignant neoplasm of glottis: Secondary | ICD-10-CM

## 2022-09-24 DIAGNOSIS — Z13220 Encounter for screening for lipoid disorders: Secondary | ICD-10-CM

## 2022-09-24 DIAGNOSIS — Z8639 Personal history of other endocrine, nutritional and metabolic disease: Secondary | ICD-10-CM

## 2022-09-24 MED ORDER — ACTEMRA ACTPEN 162 MG/0.9ML ~~LOC~~ SOAJ: 162.0000 mg | SUBCUTANEOUS | 0 refills | Status: DC

## 2022-09-24 NOTE — Patient Instructions (Signed)
Standing Labs We placed an order today for your standing lab work.   Please have your standing labs drawn in July and every 3 months   Please have your labs drawn 2 weeks prior to your appointment so that the provider can discuss your lab results at your appointment, if possible.  Please note that you may see your imaging and lab results in MyChart before we have reviewed them. We will contact you once all results are reviewed. Please allow our office up to 72 hours to thoroughly review all of the results before contacting the office for clarification of your results.  WALK-IN LAB HOURS  Monday through Thursday from 8:00 am -12:30 pm and 1:00 pm-5:00 pm and Friday from 8:00 am-12:00 pm.  Patients with office visits requiring labs will be seen before walk-in labs.  You may encounter longer than normal wait times. Please allow additional time. Wait times may be shorter on  Monday and Thursday afternoons.  We do not book appointments for walk-in labs. We appreciate your patience and understanding with our staff.   Labs are drawn by [facility]. Please bring your co-pay at the time of your lab draw.  You may receive a bill from [facility] for your lab work.  Please note if you are on Hydroxychloroquine and and an order has been placed for a Hydroxychloroquine level,  you will need to have it drawn 4 hours or more after your last dose.  If you wish to have your labs drawn at another location, please call the office 24 hours in advance so we can fax the orders.  The office is located at [address], [address], [place], [place] [address]   If you have any questions regarding directions or hours of operation,  please call [phone].   As a reminder, please drink plenty of water prior to coming for your lab work. Thanks!  

## 2022-11-01 ENCOUNTER — Other Ambulatory Visit: Payer: Self-pay | Admitting: Physician Assistant

## 2022-11-01 NOTE — Telephone Encounter (Signed)
Last Fill: 08/13/2022  Labs: 09/22/2022 White cell count is low due to immunosuppressive therapy.  CMP is normal.  Vitamin D is normal.   Next Visit: 01/24/2023  Last Visit: 09/24/2022  DX: Rheumatoid arthritis, seronegative, multiple sites   Current Dose per office note 09/24/2022: Arava 20 mg 1 tablet by mouth daily.   Okay to refill Arava ?

## 2022-11-11 ENCOUNTER — Other Ambulatory Visit: Payer: Self-pay | Admitting: Rheumatology

## 2022-11-11 NOTE — Telephone Encounter (Signed)
Last Fill: 08/24/2022  Labs: 09/22/2022 White cell count is low due to immunosuppressive therapy.  CMP is normal.  Vitamin D is normal.   Next Visit: 01/24/2023  Last Visit: 09/24/2022  DX: Rheumatoid arthritis, seronegative, multiple sites   Current Dose per office note 09/24/2022: Fosamax 70 mg 1 tablet by mouth once weekly   Okay to refill Fosamax?

## 2022-12-02 ENCOUNTER — Encounter: Payer: Self-pay | Admitting: Gastroenterology

## 2022-12-07 DIAGNOSIS — Z923 Personal history of irradiation: Secondary | ICD-10-CM | POA: Insufficient documentation

## 2022-12-07 DIAGNOSIS — C329 Malignant neoplasm of larynx, unspecified: Secondary | ICD-10-CM | POA: Insufficient documentation

## 2023-01-07 ENCOUNTER — Ambulatory Visit (AMBULATORY_SURGERY_CENTER): Payer: Medicare Other

## 2023-01-07 VITALS — Ht 63.0 in | Wt 136.8 lb

## 2023-01-07 DIAGNOSIS — Z1211 Encounter for screening for malignant neoplasm of colon: Secondary | ICD-10-CM

## 2023-01-07 MED ORDER — NA SULFATE-K SULFATE-MG SULF 17.5-3.13-1.6 GM/177ML PO SOLN: 1.0000 | Freq: Once | ORAL | 0 refills | Status: AC

## 2023-01-07 NOTE — Progress Notes (Signed)
No egg or soy allergy known to patient  No issues known to pt with past sedation with any surgeries or procedures Patient denies ever being told they had issues or difficulty with intubation  No FH of Malignant Hyperthermia Pt is not on diet pills Pt is not on  home 02  Pt is not on blood thinners  Pt denies issues with constipation takes stool softer  No A fib or A flutter Have any cardiac testing pending--no LOA: independent  Prep: suprep extra miralax   Patient's chart reviewed by Jean [name] CNRA prior to previsit and patient appropriate for the [facility].  Previsit completed and red dot placed by patient's name on their procedure day (on provider's schedule).     PV competed with patient. Prep instructions sent via mychart and home address. Goodrx coupon for [employer] provided to use for price reduction if needed.

## 2023-01-10 ENCOUNTER — Encounter: Payer: Self-pay | Admitting: Gastroenterology

## 2023-01-10 NOTE — Progress Notes (Signed)
Office Visit Note  Patient: Jean [name]             Date of Birth: 12/14/1945           MRN: [id number]             PCP: Estevan Oaks, NP Referring: [name], MD Visit Date: 01/24/2023 Occupation: @GUAROCC @  Subjective:  Medication monitoring   History of Present Illness: [name] is a 77 y.o. female with history of seronegative rheumatoid arthritis, osteoarthritis, and osteoporosis.  Patient remains on Actemra 162 mg sq injections every 14 days (initiated on 06/24/2021) and Arava 20 mg 1 tablet by mouth daily.  She remains on combination therapy without any side effects and has not missed any doses recently.  She denies any morning stiffness, difficulty with ADLs, or nocturnal pain. Patient states she is up-to-date with her mammogram and is scheduled for colonoscopy this week.  She denies any new medical conditions. She denies any recent or recurrent infections.    Activities of Daily Living:  Patient reports morning stiffness for 0 minutes.   Patient Denies nocturnal pain.  Difficulty dressing/grooming: Denies Difficulty climbing stairs: Denies Difficulty getting out of chair: Denies Difficulty using hands for taps, buttons, cutlery, and/or writing: Denies  Review of Systems  Constitutional:  Negative for fatigue.  HENT:  Negative for mouth sores and mouth dryness.   Eyes:  Negative for dryness.  Respiratory:  Negative for shortness of breath.   Cardiovascular:  Negative for chest pain and palpitations.  Gastrointestinal:  Negative for blood in stool, constipation and diarrhea.  Endocrine: Negative for increased urination.  Genitourinary:  Negative for involuntary urination.  Musculoskeletal:  Negative for joint pain, gait problem, joint pain, joint swelling, myalgias, muscle weakness, morning stiffness, muscle tenderness and myalgias.  Skin:  Negative for color change, rash, hair loss and sensitivity to sunlight.  Allergic/Immunologic: Negative  for susceptible to infections.  Neurological:  Negative for dizziness and headaches.  Hematological:  Negative for swollen glands.  Psychiatric/Behavioral:  Negative for depressed mood and sleep disturbance. The patient is not nervous/anxious.     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   Vitamin D deficiency 01/23/2017   Rheumatoid arthritis, seronegative, multiple sites (HCC) 01/19/2017   High risk medication use 01/19/2017   Primary osteoarthritis of both hands 01/19/2017   Primary osteoarthritis of both knees 01/19/2017   Fatigue 01/19/2017   Thrombocytosis 01/19/2017   Primary osteoarthritis of both feet 01/19/2017   Deficiency anemia 01/19/2017   Screening-pulmonary TB 04/26/2016    Past Medical History:  Diagnosis Date   Arthritis    Cancer [facility])    History of radiation therapy 05/03/19- 06/25/19   Larynx 35 fractions of 2 Gy each to total 70 Gy   Hypertension    Ingrown toenail    Rheumatoid arthritis (HCC)    Vocal cord mass     Family History  Problem Relation Age of Onset   Stroke Mother  Hypertension Daughter    Colon polyps Neg Hx    Colon cancer Neg Hx    Esophageal cancer Neg Hx    Rectal cancer Neg Hx    Stomach cancer Neg Hx    Past Surgical History:  Procedure Laterality Date   ECTOPIC PREGNANCY SURGERY     EXTERNAL EAR SURGERY Right    removal of cyst or gland   IR GASTROSTOMY TUBE MOD SED  04/26/2019    IR GASTROSTOMY TUBE REMOVAL  11/05/2019   IR IMAGING GUIDED PORT INSERTION  04/26/2019   IR REMOVAL TUN ACCESS W/ PORT W/O FL MOD SED  11/05/2019   MICROLARYNGOSCOPY Left 03/26/2019   Procedure: DIRECT MICROLARYNGOSCOPY WITH BIOPSY OF VOCAL CORD MASS;  Surgeon: Newman Pies, MD;  Location: [facility];  Service: ENT;  Laterality: Left;   Social History   Social History Narrative   Patient was recently widowed in March 2020.   Patient has 1 daughter who lives in [place], [place]   Patient moved to [place] from [place] in January 2009.   Patient is a retired Char[employer] fundraiser.   Immunization History  Administered Date(s) Administered   Fluad Quad(high Dose 65+) 05/20/2020   Influenza, High Dose Seasonal PF 06/09/2017   PFIZER(Purple Top)SARS-COV-2 Vaccination 08/29/2019, 09/19/2019, 07/11/2020     Objective: Vital Signs: BP (!) 155/83 (BP Location: Right Arm, Patient Position: Sitting, Cuff Size: Normal)   Pulse 82   Resp 14   Ht 5\' 3"  (1.6 m)   Wt 139 lb 6.4 oz (63.2 kg)   BMI 24.69 kg/m    Physical Exam Vitals and nursing note reviewed.  Constitutional:      Appearance: She is well-developed.  HENT:     Head: Normocephalic and atraumatic.  Eyes:     Conjunctiva/sclera: Conjunctivae normal.  Cardiovascular:     Rate and Rhythm: Normal rate and regular rhythm.     Heart sounds: Normal heart sounds.  Pulmonary:     Effort: Pulmonary effort is normal.     Breath sounds: Normal breath sounds.  Abdominal:     General: Bowel sounds are normal.     Palpations: Abdomen is soft.  Musculoskeletal:     Cervical back: Normal range of motion.  Lymphadenopathy:     Cervical: No cervical adenopathy.  Skin:    General: Skin is warm and dry.     Capillary Refill: Capillary refill takes less than 2 seconds.  Neurological:     Mental Status: She is alert and oriented to person, place, and time.  Psychiatric:        Behavior: Behavior normal.      Musculoskeletal Exam:  C-spine has slightly limited range of motion with lateral rotation.  Shoulder joints, elbow joints, wrist joints, MCPs, PIPs, DIPs have good range of motion with no synovitis.  Complete fist formation bilaterally.  Hip joints have good range of motion with no groin pain.  Knee joints have good range of motion with no warmth or effusion.  Ankle joints have good range of motion with no tenderness or joint swelling.  CDAI Exam: CDAI Score: -- Patient Global: --; Provider Global: -- Swollen: --; Tender: -- Joint Exam 01/24/2023   No joint exam has been documented for this visit   There is currently no information documented on the homunculus. Go to the Rheumatology activity and complete the homunculus joint exam.  Investigation: No additional findings.  Imaging: No results found.  Recent Labs: Lab Results  Component Value Date  WBC 3.5 (L) 01/21/2023   HGB 13.2 01/21/2023   PLT 230 01/21/2023   NA 142 01/21/2023   K 4.7 01/21/2023   CL 109 01/21/2023   CO2 27 01/21/2023   GLUCOSE 91 01/21/2023   BUN 9 01/21/2023   CREATININE 0.96 01/21/2023   BILITOT 0.3 01/21/2023   ALKPHOS 100 05/29/2021   AST 19 01/21/2023   ALT 19 01/21/2023   PROT 6.3 01/21/2023   ALBUMIN 3.5 05/29/2021   CALCIUM 10.1 01/21/2023   GFRAA 100 09/03/2020   QFTBGOLDPLUS NEGATIVE 06/23/2022    Speciality Comments: PPD- 07/08/17 Negative, Humira started August 06, 2020-inadequate response (last dose 12/24/20), Enbrel started 01/15/21 (stopped 06/17/21), Actemra started 06/24/21,MTX-high Cr 11/21  Procedures:  No procedures performed Allergies: Misc. sulfonamide containing compounds and Sulfa antibiotics   Assessment / Plan:     Visit Diagnoses: Rheumatoid arthritis, seronegative, multiple sites [facility] [place]): She has no synovitis on examination today.  She has not had any signs or symptoms of a rheumatoid arthritis flare.  She is clinically doing well on Actemra 162 mg sq injections every 14 days along with regular  20 mg 1 tablet by mouth daily.  She is tolerating combination therapy without any side effects.  She has not had any recent or recurrent infections.  She has not had any nocturnal pain, difficulty with ADLs, or morning stiffness.  She will remain on Actemra and arava as prescribed.  She will notify us if she develops signs or symptoms of a flare.  She will follow-up in the office in 5 months or sooner if needed.  High risk medication use - Actemra 162 mg sq injections every 14 days (initiated on 06/24/2021) and  Arava 20 mg 1 tablet by mouth daily. TB gold negative on 06/23/22.  CBC and CMP updated on 01/21/23.  Her next lab work will be due in November and every 3 months.  Lipid panel updated on 01/21/23.  No recent or recurrent infections. Discussed the importance of holding actemra if she develops signs or symptoms of an infection and to resume once the infection has completely cleared.   Primary osteoarthritis of both hands: She has PIP and DIP thickening consistent with osteoarthritis of both hands.  No synovitis noted.  Primary osteoarthritis of both knees: She has good range of motion of both knee joints on examination today.  No warmth or effusion noted.  Primary osteoarthritis of both feet: Good range of motion of both ankle joints with no tenderness or synovitis.  She is wearing proper fitting shoes.  Spondylosis without myelopathy or radiculopathy, cervical region: C-spine has slightly limited ROM.  No symptoms of radiculopathy.   Age-related osteoporosis without current pathological fracture - DEXA 04/05/2022: Right femoral neck BMD 0.594, T-score -2.3. She is taking Fosamax 70 mg 1 tablet by mouth once weekly.  History of vitamin D deficiency: She remains on a daily vitamin D supplement.   Other medical conditions are listed as follows:   Abnormal SPEP - Evaluated by Dr. Bertis Ruddy.  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 Bertis Ruddy and ENT. S/P CTX and  RTX.  History of anemia  Orders: No orders of the defined types were placed in this encounter.  No orders of the defined types were placed in this encounter.   Follow-Up Instructions: Return in about 5 months (around 06/26/2023) for Rheumatoid arthritis, Osteoarthritis, Osteoporosis.   Gearldine Bienenstock, PA-C  Note - This record has been created using Dragon software.  Chart creation  errors have been sought, but may not always  have been located. Such creation errors do not reflect on  the standard of medical care.

## 2023-01-13 ENCOUNTER — Other Ambulatory Visit: Payer: Self-pay | Admitting: Physician Assistant

## 2023-01-13 NOTE — Telephone Encounter (Signed)
Last Fill: 11/01/2022  Labs: 09/22/2022 White cell count is low due to immunosuppressive therapy.  CMP is normal.   Next Visit: 01/24/2023  Last Visit: 09/24/2022  DX: Rheumatoid arthritis, seronegative, multiple sites   Current Dose per office note 09/24/2022: Arava 20 mg 1 tablet by mouth daily.   Patient to update labs at upcoming appointment on 01/24/2023  Okay to refill Arava ?

## 2023-01-20 ENCOUNTER — Other Ambulatory Visit: Payer: Self-pay

## 2023-01-20 DIAGNOSIS — M0609 Rheumatoid arthritis without rheumatoid factor, multiple sites: Secondary | ICD-10-CM

## 2023-01-20 DIAGNOSIS — Z79899 Other long term (current) drug therapy: Secondary | ICD-10-CM

## 2023-01-20 DIAGNOSIS — Z13220 Encounter for screening for lipoid disorders: Secondary | ICD-10-CM

## 2023-01-22 NOTE — Progress Notes (Signed)
Cholesterol elevated-288. Triglycerides and LDL are elevated.  Please notify the patient and advise patient to follow up with PCP to discus need for medication vs. Dietary modifications.

## 2023-01-23 NOTE — Progress Notes (Signed)
CMP is normal, white cell count is low and stable.

## 2023-01-24 ENCOUNTER — Encounter: Payer: Self-pay | Admitting: Physician Assistant

## 2023-01-24 ENCOUNTER — Other Ambulatory Visit: Payer: Self-pay

## 2023-01-24 ENCOUNTER — Ambulatory Visit: Payer: Medicare Other | Attending: Physician Assistant | Admitting: Physician Assistant

## 2023-01-24 VITALS — BP 155/83 | HR 82 | Resp 14 | Ht 63.0 in | Wt 139.4 lb

## 2023-01-24 DIAGNOSIS — C320 Malignant neoplasm of glottis: Secondary | ICD-10-CM

## 2023-01-24 DIAGNOSIS — M19041 Primary osteoarthritis, right hand: Secondary | ICD-10-CM

## 2023-01-24 DIAGNOSIS — Z79899 Other long term (current) drug therapy: Secondary | ICD-10-CM | POA: Diagnosis not present

## 2023-01-24 DIAGNOSIS — M19072 Primary osteoarthritis, left ankle and foot: Secondary | ICD-10-CM

## 2023-01-24 DIAGNOSIS — M47812 Spondylosis without myelopathy or radiculopathy, cervical region: Secondary | ICD-10-CM

## 2023-01-24 DIAGNOSIS — M19071 Primary osteoarthritis, right ankle and foot: Secondary | ICD-10-CM

## 2023-01-24 DIAGNOSIS — M170 Bilateral primary osteoarthritis of knee: Secondary | ICD-10-CM

## 2023-01-24 DIAGNOSIS — M810 Age-related osteoporosis without current pathological fracture: Secondary | ICD-10-CM

## 2023-01-24 DIAGNOSIS — M19042 Primary osteoarthritis, left hand: Secondary | ICD-10-CM

## 2023-01-24 DIAGNOSIS — Z862 Personal history of diseases of the blood and blood-forming organs and certain disorders involving the immune mechanism: Secondary | ICD-10-CM

## 2023-01-24 DIAGNOSIS — M0609 Rheumatoid arthritis without rheumatoid factor, multiple sites: Secondary | ICD-10-CM

## 2023-01-24 DIAGNOSIS — Z8639 Personal history of other endocrine, nutritional and metabolic disease: Secondary | ICD-10-CM

## 2023-01-24 DIAGNOSIS — R778 Other specified abnormalities of plasma proteins: Secondary | ICD-10-CM

## 2023-01-24 MED ORDER — ACTEMRA ACTPEN 162 MG/0.9ML ~~LOC~~ SOAJ
162.0000 mg | SUBCUTANEOUS | 0 refills | Status: DC
Start: 2023-01-24 — End: 2023-04-11

## 2023-01-24 NOTE — Patient Instructions (Signed)

## 2023-01-24 NOTE — Telephone Encounter (Signed)
Please review and sign pended refill for actemra. Thanks!

## 2023-01-27 ENCOUNTER — Ambulatory Visit (AMBULATORY_SURGERY_CENTER): Payer: Medicare Other | Admitting: Gastroenterology

## 2023-01-27 ENCOUNTER — Encounter: Payer: Self-pay | Admitting: Gastroenterology

## 2023-01-27 VITALS — BP 117/84 | HR 74 | Temp 98.2°F | Resp 16 | Ht 63.0 in | Wt 136.8 lb

## 2023-01-27 DIAGNOSIS — Z1211 Encounter for screening for malignant neoplasm of colon: Secondary | ICD-10-CM

## 2023-01-27 DIAGNOSIS — D125 Benign neoplasm of sigmoid colon: Secondary | ICD-10-CM

## 2023-01-27 DIAGNOSIS — K635 Polyp of colon: Secondary | ICD-10-CM

## 2023-01-27 MED ORDER — SODIUM CHLORIDE 0.9 % IV SOLN: 500.0000 mL | INTRAVENOUS | Status: DC

## 2023-01-27 NOTE — Progress Notes (Signed)
[facility] History and Physical   Primary Care Physician:  Estevan Oaks, NP   Reason for Procedure:   Colon cancer screening  Plan:    Screening colonoscopy     HPI: Jean [name] is a 77 y.o. female undergoing average risk screening colonoscopy.  She has no family history of colon cancer and no chronic GI symptoms.  She had a colonoscopy in [place], [place] about 15 years ago and believes she had polyps removed.    Past Medical History:  Diagnosis Date   Arthritis    Cancer [facility])    History of radiation therapy 05/03/19- 06/25/19   Larynx 35 fractions of 2 Gy each to total 70 Gy   Hypertension    Ingrown toenail    Rheumatoid arthritis (HCC)    Vocal cord mass     Past Surgical History:  Procedure Laterality Date   ECTOPIC PREGNANCY SURGERY     EXTERNAL EAR SURGERY Right    removal of cyst or gland   IR GASTROSTOMY TUBE MOD SED  04/26/2019   IR GASTROSTOMY TUBE REMOVAL  11/05/2019   IR IMAGING GUIDED PORT INSERTION  04/26/2019   IR REMOVAL TUN ACCESS W/ PORT W/O FL MOD SED  11/05/2019   MICROLARYNGOSCOPY Left 03/26/2019   Procedure: DIRECT MICROLARYNGOSCOPY WITH BIOPSY OF VOCAL CORD MASS;  Surgeon: Newman Pies, MD;  Location: [facility];  Service: ENT;  Laterality: Left;    Prior to Admission medications   Medication Sig Start Date End Date Taking? Authorizing Provider  alendronate (FOSAMAX) 70 MG tablet TAKE 1 TABLET(70 MG) BY MOUTH 1 TIME A WEEK WITH A FULL GLASS OF WATER AND ON AN EMPTY STOMACH 11/11/22  Yes Gearldine Bienenstock, PA-C  amLODipine (NORVASC) 5 MG tablet Take 5 mg by mouth daily. 10/26/22  Yes [provider]  Cholecalciferol (VITAMIN D) 50 MCG (2000 UT) CAPS Take 2,000 Units by mouth daily.    Yes [provider]  Docusate Calcium (STOOL SOFTENER PO) Take by mouth.   Yes [provider]  famotidine (PEPCID) 20 MG tablet Take 1 tablet (20 mg total) by mouth 2 (two) times daily as needed for heartburn or  indigestion. 04/08/21  Yes Jenel Lucks, MD  leflunomide (ARAVA) 20 MG tablet TAKE 1 TABLET(20 MG) BY MOUTH DAILY 01/13/23  Yes Deveshwar, Janalyn Rouse, MD  losartan (COZAAR) 100 MG tablet Take 100 mg by mouth daily.  08/04/16  Yes [provider]  metoprolol succinate (TOPROL-XL) 50 MG 24 hr tablet Take 50 mg by mouth daily. 08/01/20  Yes [provider]  Tocilizumab (ACTEMRA ACTPEN) 162 MG/0.9ML SOAJ Inject 162 mg into the skin every 14 (fourteen) days. 01/24/23  Yes Gearldine Bienenstock, PA-C  vitamin B-12 (CYANOCOBALAMIN) 1000 MCG tablet Take 1,000 mcg by mouth daily.   Yes [provider]  omeprazole (PRILOSEC) 40 MG capsule Take 1 capsule (40 mg total) by mouth daily. Patient not taking: Reported on 06/24/2022 09/08/20   Artis Delay, MD  terbinafine (LAMISIL) 250 MG tablet Take 250 mg by mouth daily. Patient not taking: Reported on 01/07/2023 09/22/22   [provider]  TURMERIC CURCUMIN PO Take 1,500 mg by mouth 3 (three) times daily. With bioperine Patient not taking: Reported on 01/24/2023    [provider]    Current Outpatient Medications  Medication Sig Dispense Refill   alendronate (FOSAMAX) 70 MG tablet TAKE 1 TABLET(70 MG) BY MOUTH 1 TIME A WEEK WITH A FULL GLASS OF WATER AND  ON AN EMPTY STOMACH 12 tablet 0   amLODipine (NORVASC) 5 MG tablet Take 5 mg by mouth daily.     Cholecalciferol (VITAMIN D) 50 MCG (2000 UT) CAPS Take 2,000 Units by mouth daily.      Docusate Calcium (STOOL SOFTENER PO) Take by mouth.     famotidine (PEPCID) 20 MG tablet Take 1 tablet (20 mg total) by mouth 2 (two) times daily as needed for heartburn or indigestion. 90 tablet 3   leflunomide (ARAVA) 20 MG tablet TAKE 1 TABLET(20 MG) BY MOUTH DAILY 90 tablet 0   losartan (COZAAR) 100 MG tablet Take 100 mg by mouth daily.   0   metoprolol succinate (TOPROL-XL) 50 MG 24 hr tablet Take 50 mg by mouth daily.     Tocilizumab (ACTEMRA ACTPEN) 162 MG/0.9ML SOAJ Inject 162 mg into the  skin every 14 (fourteen) days. 5.4 mL 0   vitamin B-12 (CYANOCOBALAMIN) 1000 MCG tablet Take 1,000 mcg by mouth daily.     omeprazole (PRILOSEC) 40 MG capsule Take 1 capsule (40 mg total) by mouth daily. (Patient not taking: Reported on 06/24/2022) 90 capsule 3   terbinafine (LAMISIL) 250 MG tablet Take 250 mg by mouth daily. (Patient not taking: Reported on 01/07/2023)     TURMERIC CURCUMIN PO Take 1,500 mg by mouth 3 (three) times daily. With bioperine (Patient not taking: Reported on 01/24/2023)     Current Facility-Administered Medications  Medication Dose Route Frequency Provider Last Rate Last Admin   0.9 %  sodium chloride infusion  500 mL Intravenous Continuous Jenel Lucks, MD        Allergies as of 01/27/2023 - Review Complete 01/27/2023  Allergen Reaction Noted   Misc. sulfonamide containing compounds Swelling 11/27/2013   Sulfa antibiotics Swelling 11/27/2013    Family History  Problem Relation Age of Onset   Stroke Mother    Hypertension Daughter    Colon polyps Neg Hx    Colon cancer Neg Hx    Esophageal cancer Neg Hx    Rectal cancer Neg Hx    Stomach cancer Neg Hx     Social History   Socioeconomic History   Marital status: Widowed    Spouse name: Not on file   Number of children: 1   Years of education: Not on file   Highest education level: Not on file  Occupational History   Not on file  Tobacco Use   Smoking status: Former    Current packs/day: 0.00    Average packs/day: 1 pack/day for 30.0 years (30.0 ttl pk-yrs)    Types: Cigarettes    Start date: 11/07/1973    Quit date: 11/08/2003    Years since quitting: 19.2    Passive exposure: Never   Smokeless tobacco: Never  Vaping Use   Vaping status: Never Used  Substance and Sexual Activity   Alcohol use: Not Currently   Drug use: No   Sexual activity: Not on file  Other Topics Concern   Not on file  Social History Narrative   Patient was recently widowed in March 2020.   Patient has 1  daughter who lives in [place], [place]   Patient moved to [place] from [place] in January 2009.   Patient is a retired Char[employer] fundraiser.   Social Determinants of Health   Financial Resource Strain: Not on file  Food Insecurity: Low Risk  (12/07/2022)   Received from [facility]   Food vital sign    Within the past 12  months, you worried that your food would run out before you got money to buy more: Never true    Within the past 12 months, the food you bought just didn't last and you didn't have money to get more. : Never true  Transportation Needs: Not on file (12/07/2022)  Physical Activity: Not on file  Stress: Not on file  Social Connections: Not on file  Intimate Partner Violence: Not At Risk (04/18/2019)   Humiliation, Afraid, Rape, and Kick questionnaire    Fear of Current or Ex-Partner: No    Emotionally Abused: No    Physically Abused: No    Sexually Abused: No    Review of Systems:  All other review of systems negative except as mentioned in the HPI.  Physical Exam: Vital signs BP (!) 149/95   Pulse 80   Temp 98.2 F (36.8 C)   Ht 5\' 3"  (1.6 m)   Wt 136 lb 12.8 oz (62.1 kg)   SpO2 96%   BMI 24.23 kg/m   General:   Alert,  Well-developed, well-nourished, pleasant and cooperative in NAD Airway:  Mallampati 1 Lungs:  Clear throughout to auscultation.   Heart:  Regular rate and rhythm; no murmurs, clicks, rubs,  or gallops. Abdomen:  Soft, nontender and nondistended. Normal bowel sounds.   Neuro/Psych:  Normal mood and affect. A and O x 3    E. Tomasa Rand, MD [facility]

## 2023-01-27 NOTE — Progress Notes (Signed)
Called to room to assist during endoscopic procedure.  Patient ID and intended procedure confirmed with present staff. Received instructions for my participation in the procedure from the performing physician.  

## 2023-01-27 NOTE — Patient Instructions (Signed)
Handou ton polyps and diverticulosis given. High fiber diet.    YOU HAD AN ENDOSCOPIC PROCEDURE TODAY AT [facility]:   Refer to the procedure report that was given to you for any specific questions about what was found during the examination.  If the procedure report does not answer your questions, please call your gastroenterologist to clarify.  If you requested that your care partner not be given the details of your procedure findings, then the procedure report has been included in a sealed envelope for you to review at your convenience later.  YOU SHOULD EXPECT: Some feelings of bloating in the abdomen. Passage of more gas than usual.  Walking can help get rid of the air that was put into your GI tract during the procedure and reduce the bloating. If you had a lower endoscopy (such as a colonoscopy or flexible sigmoidoscopy) you may notice spotting of blood in your stool or on the toilet paper. If you underwent a bowel prep for your procedure, you may not have a normal bowel movement for a few days.  Please Note:  You might notice some irritation and congestion in your nose or some drainage.  This is from the oxygen used during your procedure.  There is no need for concern and it should clear up in a day or so.  SYMPTOMS TO REPORT IMMEDIATELY:  Following lower endoscopy (colonoscopy or flexible sigmoidoscopy):  Excessive amounts of blood in the stool  Significant tenderness or worsening of abdominal pains  Swelling of the abdomen that is new, acute  Fever of 100F or higher   For urgent or emergent issues, a gastroenterologist can be reached at any hour by calling [phone]. Do not use MyChart messaging for urgent concerns.    DIET:  We do recommend a small meal at first, but then you may proceed to your regular diet.  Drink plenty of fluids but you should avoid alcoholic beverages for 24 hours.  ACTIVITY:  You should plan to take it easy for the rest of today and  you should NOT DRIVE or use heavy machinery until tomorrow (because of the sedation medicines used during the test).    FOLLOW UP: Our staff will call the number listed on your records the next business day following your procedure.  We will call around 7:15- 8:00 am to check on you and address any questions or concerns that you may have regarding the information given to you following your procedure. If we do not reach you, we will leave a message.     If any biopsies were taken you will be contacted by phone or by letter within the next 1-3 weeks.  Please call us at [phone] if you have not heard about the biopsies in 3 weeks.    SIGNATURES/CONFIDENTIALITY: You and/or your care partner have signed paperwork which will be entered into your electronic medical record.  These signatures attest to the fact that that the information above on your After Visit Summary has been reviewed and is understood.  Full responsibility of the confidentiality of this discharge information lies with you and/or your care-partner.

## 2023-01-27 NOTE — Progress Notes (Signed)
Pt's states no medical or surgical changes since previsit or office visit. 

## 2023-01-27 NOTE — Op Note (Addendum)
[facility] Patient Name: Jean [name] Procedure Date: 01/27/2023 9:54 AM MRN: [id number] Endoscopist: Lorin Picket E. Tomasa Rand , MD, [id number] Age: 77 Referring MD:  Date of Birth: 1946-02-16 Gender: Female Account #: [id number] Procedure:                Colonoscopy Indications:              Screening for colorectal malignant neoplasm (last                            colonoscopy was more than 10 years ago) Medicines:                Monitored Anesthesia Care Procedure:                Pre-Anesthesia Assessment:                           - Prior to the procedure, a History and Physical                            was performed, and patient medications and                            allergies were reviewed. The patient's tolerance of                            previous anesthesia was also reviewed. The risks                            and benefits of the procedure and the sedation                            options and risks were discussed with the patient.                            All questions were answered, and informed consent                            was obtained. Prior Anticoagulants: The patient has                            taken no anticoagulant or antiplatelet agents. ASA                            Grade Assessment: II - A patient with mild systemic                            disease. After reviewing the risks and benefits,                            the patient was deemed in satisfactory condition to                            undergo the procedure.  After obtaining informed consent, the colonoscope                            was passed under direct vision. Throughout the                            procedure, the patient's blood pressure, pulse, and                            oxygen saturations were monitored continuously. The                            [id number]-[id number] Slim SN [id number] was introduced through                            the anus  and advanced to the the cecum, identified                            by appendiceal orifice and ileocecal valve. The CF                            [id number] #[id number] was introduced through the sigmoid                            before being withdrawn and exchanged for the                            ultraslim colonscope. The colonoscopy was somewhat                            difficult due to multiple diverticula in the colon,                            poor endoscopic visualization secondary to                            difficulty maintaining insufflation, restricted                            mobility of the colon and a tortuous colon.                            Successful completion of the procedure was aided by                            using manual pressure and withdrawing the scope and                            replacing with the ultraslim colonoscope. The                            patient tolerated the procedure well. The quality  of the bowel preparation was good. The ileocecal                            valve, appendiceal orifice, and rectum were                            photographed. The bowel preparation used was SUPREP                            via split dose instruction. Scope In: 10:09:55 AM Scope Out: 10:38:54 AM Scope Withdrawal Time: 0 hours 14 minutes 45 seconds  Total Procedure Duration: 0 hours 28 minutes 59 seconds  Findings:                 The perianal and digital rectal examinations were                            normal. Pertinent negatives include normal                            sphincter tone and no palpable rectal lesions.                           A 4 mm polyp was found in the sigmoid colon. The                            polyp was sessile. The polyp was removed with a                            cold snare. Resection and retrieval were complete.                            Estimated blood loss was minimal.                            Many medium-mouthed and small-mouthed diverticula                            were found in the sigmoid colon, descending colon                            and transverse colon. There was narrowing of the                            colon in association with the diverticular opening.                           A diffuse area of mild melanosis was found in the                            entire colon.                           The exam was otherwise normal throughout the  examined colon.                           The retroflexed view of the distal rectum and anal                            verge was normal and showed no anal or rectal                            abnormalities. Complications:            No immediate complications. Estimated Blood Loss:     Estimated blood loss was minimal. Impression:               - One 4 mm polyp in the sigmoid colon, removed with                            a cold snare. Resected and retrieved.                           - Severe diverticulosis in the sigmoid colon, in                            the descending colon and in the transverse colon.                            There was narrowing of the colon in association                            with the diverticular opening.                           - Melanosis in the colon.                           - The distal rectum and anal verge are normal on                            retroflexion view. Recommendation:           - Patient has a contact number available for                            emergencies. The signs and symptoms of potential                            delayed complications were discussed with the                            patient. Return to normal activities tomorrow.                            Written discharge instructions were provided to the                            patient.                           -  Resume previous diet.                           - Continue  present medications.                           - Await pathology results.                           - Recommend against further colon cancer screening                            given patient's age and lack of high risk polyps.                           - Recommend high fiber diet or daily fiber                            supplement to reduce risk of diverticular                            complications.  E. Tomasa Rand, MD 01/27/2023 10:45:40 AM This report has been signed electronically.

## 2023-01-27 NOTE — Progress Notes (Signed)
Uneventful anesthetic. Report to pacu rn. Vss. Care resumed by rn. 

## 2023-01-28 ENCOUNTER — Telehealth: Payer: Self-pay | Admitting: *Deleted

## 2023-01-28 NOTE — Telephone Encounter (Signed)
  Follow up Call-     01/27/2023    9:04 AM  Call back number  Post procedure Call Back phone  # [phone]  Permission to leave phone message Yes     Patient questions:  Do you have a fever, pain , or abdominal swelling? No. Pain Score  0 *  Have you tolerated food without any problems? Yes.    Have you been able to return to your normal activities? Yes.    Do you have any questions about your discharge instructions: Diet   No. Medications  No. Follow up visit  No.  Do you have questions or concerns about your Care? No.  Actions: * If pain score is 4 or above: No action needed, pain <4.

## 2023-02-03 ENCOUNTER — Other Ambulatory Visit: Payer: Self-pay | Admitting: Physician Assistant

## 2023-02-03 NOTE — Telephone Encounter (Signed)
Last Fill: 11/11/2022  Labs: 01/21/2023 CMP is normal, white cell count is low and stable.   Next Visit: 06/29/2023  Last Visit: 01/24/2023  DX: Age-related osteoporosis without current pathological fracture   Current Dose per office note 01/24/2023: Fosamax 70 mg 1 tablet by mouth once weekly.   Okay to refill Fosamax?

## 2023-02-08 NOTE — Progress Notes (Signed)
Jean [name],  Good news: the polyp that I removed during your recent examination was NOT precancerous.  As previously discussed, based on your age and lack of high risk polyps, I would recommend against any further colon cancer screening.   If you develop any new rectal bleeding, abdominal pain or significant bowel habit changes, please contact me before then.

## 2023-03-09 ENCOUNTER — Telehealth: Payer: Self-pay | Admitting: Pharmacist

## 2023-03-09 NOTE — Telephone Encounter (Signed)
Received fax from [employer] stating that they have been unsuccessful in reaching patient to verify ongoing eligibility for patient assistance program for Actemra.  Unable to reach. Left VM for pt with phone number for Ferol Luz, PharmD, MPH, BCPS, CPP Clinical Pharmacist (Rheumatology and Pulmonology)

## 2023-03-15 NOTE — Telephone Encounter (Signed)
Called patient regarding Jean [name]. Patient states she called [place] last week and they requested maximum OOP for insurance. She states she will call her insurance for this information and then re-contact [employer] with this  Chesley Mires, PharmD, MPH, BCPS, CPP Clinical Pharmacist (Rheumatology and Pulmonology)

## 2023-03-28 ENCOUNTER — Other Ambulatory Visit: Payer: Self-pay | Admitting: Gastroenterology

## 2023-04-04 NOTE — Telephone Encounter (Signed)
error 

## 2023-04-11 ENCOUNTER — Other Ambulatory Visit: Payer: Self-pay | Admitting: *Deleted

## 2023-04-11 DIAGNOSIS — Z79899 Other long term (current) drug therapy: Secondary | ICD-10-CM

## 2023-04-11 DIAGNOSIS — M0609 Rheumatoid arthritis without rheumatoid factor, multiple sites: Secondary | ICD-10-CM

## 2023-04-11 MED ORDER — ACTEMRA ACTPEN 162 MG/0.9ML ~~LOC~~ SOAJ
162.0000 mg | SUBCUTANEOUS | 0 refills | Status: DC
Start: 2023-04-11 — End: 2023-10-31

## 2023-04-11 NOTE — Telephone Encounter (Signed)
Refill request received via fax from [employer] for Actemra   Last Fill: 01/24/2023  Labs: 01/21/2023 CMP is normal, white cell count is low and stable.   TB Gold: 06/23/2022 Neg    Next Visit: 06/29/2023  Last Visit: 01/24/2023  DX: Rheumatoid arthritis, seronegative, multiple sites   Current Dose per office note 01/24/2023: Actemra 162 mg sq injections every 14 days   Okay to refill Actemra?

## 2023-04-16 ENCOUNTER — Other Ambulatory Visit: Payer: Self-pay | Admitting: Rheumatology

## 2023-04-18 NOTE — Telephone Encounter (Signed)
Last Fill: 01/13/2023  Labs: 01/21/2023 CMP is normal, white cell count is low and stable.   Next Visit: 06/29/2023  Last Visit: 01/24/2023  DX: Rheumatoid arthritis, seronegative, multiple sites   Current Dose per office note 01/24/2023: Arava 20 mg 1 tablet by mouth daily.   Okay to refill Arava ?

## 2023-04-30 ENCOUNTER — Other Ambulatory Visit: Payer: Self-pay | Admitting: Physician Assistant

## 2023-05-02 NOTE — Telephone Encounter (Signed)
Last Fill: 02/03/2023  Labs: 01/21/2023 CMP is normal, white cell count is low and stable.   Next Visit: 06/29/2023  Last Visit: 01/24/2023  DX: Age-related osteoporosis without current pathological fracture   Current Dose per office note 01/24/2023: Fosamax 70 mg 1 tablet by mouth once weekly.   Okay to refill Fosamax?

## 2023-06-15 NOTE — Progress Notes (Signed)
 Office Visit Note  Patient: Jean [name]             Date of Birth: 08-22-1945           MRN: [id number]             PCP: Delores Rojelio Caldron, NP Referring: Delores Rojelio Caldron, NP Visit Date: 06/29/2023 Occupation: @GUAROCC @  Subjective:  Medication management  History of Present Illness: MARTI MCLANE is a 77 y.o. female with sero negative rheumatoid arthritis, osteoarthritis and osteoporosis.  Patient states that she has not had a flare of rheumatoid arthritis since the last visit.  She has been taking Actemra  162 mg subcu every 14 days and Areva 20 mg by mouth daily.  She had no interruption in the therapy and no infections.  She states she was given a prescription for Crestor by her PCP for hyperlipidemia but she decided not to take the medication.  She also reports that she has been noncompliant with Fosamax  use over the last several years.  She states she has taken total of 6 months of Fosamax .  She plans to take Fosamax  weekly for the next few months.  Will repeat DEXA scan in October 2025.  She states she would prefer to get off Fosamax  after her next bone density.  Has been taking B12 and vitamin D  on a regular basis.    Activities of Daily Living:  Patient reports morning stiffness for 30 minutes.   Patient Denies nocturnal pain.  Difficulty dressing/grooming: Denies Difficulty climbing stairs: Denies Difficulty getting out of chair: Denies Difficulty using hands for taps, buttons, cutlery, and/or writing: Denies  Review of Systems  Constitutional:  Negative for fatigue.  HENT:  Negative for mouth sores and mouth dryness.   Eyes:  Negative for dryness.  Respiratory:  Negative for shortness of breath.   Cardiovascular:  Negative for chest pain and palpitations.  Gastrointestinal:  Negative for blood in stool, constipation and diarrhea.  Endocrine: Negative for increased urination.  Genitourinary:  Negative for involuntary urination.  Musculoskeletal:   Positive for joint pain, joint pain and morning stiffness. Negative for gait problem, joint swelling, myalgias, muscle weakness, muscle tenderness and myalgias.  Skin:  Negative for color change, rash, hair loss and sensitivity to sunlight.  Allergic/Immunologic: Negative for susceptible to infections.  Neurological:  Negative for dizziness and headaches.  Hematological:  Negative for swollen glands.  Psychiatric/Behavioral:  Negative for depressed mood and sleep disturbance. The patient is not nervous/anxious.     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   Vitamin D  deficiency 01/23/2017   Rheumatoid arthritis, seronegative, multiple sites (HCC) 01/19/2017   High risk medication use 01/19/2017   Primary osteoarthritis of both hands 01/19/2017   Primary osteoarthritis of both knees 01/19/2017   Fatigue 01/19/2017   Thrombocytosis 01/19/2017   Primary osteoarthritis of both feet 01/19/2017   Deficiency anemia 01/19/2017   Screening-pulmonary TB 04/26/2016    Past Medical History:  Diagnosis Date  Arthritis    Cancer (HCC)    History of radiation therapy 05/03/19- 06/25/19   Larynx 35 fractions of 2 Gy each to total 70 Gy   Hypertension    Ingrown toenail    Rheumatoid arthritis (HCC)    Vocal cord mass     Family History  Problem Relation Age of Onset   Stroke Mother    Hypertension Daughter    Colon polyps Neg Hx    Colon cancer  Neg Hx    Esophageal cancer Neg Hx    Rectal cancer Neg Hx    Stomach cancer Neg Hx    Past Surgical History:  Procedure Laterality Date   ECTOPIC PREGNANCY SURGERY     EXTERNAL EAR SURGERY Right    removal of cyst or gland   IR GASTROSTOMY TUBE MOD SED  04/26/2019   IR GASTROSTOMY TUBE REMOVAL  11/05/2019   IR IMAGING GUIDED PORT INSERTION  04/26/2019   IR REMOVAL TUN ACCESS W/ PORT W/O FL MOD SED  11/05/2019   MICROLARYNGOSCOPY Left 03/26/2019   Procedure: DIRECT MICROLARYNGOSCOPY WITH BIOPSY OF VOCAL CORD MASS;  Surgeon: Karis Clunes, MD;  Location: [facility];  Service: ENT;  Laterality: Left;   Social History   Social History Narrative   Patient was recently widowed in March 2020.   Patient has 1 daughter who lives in [place], [place]    Patient moved to [place]  from [place]  in January 2009.   Patient is a retired [employer].   Immunization History  Administered Date(s) Administered   Fluad Quad(high Dose 65+) 05/20/2020   Influenza, High Dose Seasonal PF 06/09/2017   PFIZER(Purple Top)SARS-COV-2 Vaccination 08/29/2019, 09/19/2019, 07/11/2020     Objective: Vital Signs: BP 135/79 (BP Location: Left Arm, Patient Position: Sitting, Cuff Size: Normal)   Pulse 85   Resp 14   Ht 5' 3 (1.6 m)   Wt 135 lb (61.2 kg)   BMI 23.91 kg/m    Physical Exam Vitals and nursing note reviewed.  Constitutional:      Appearance: She is well-developed.  HENT:     Head: Normocephalic and atraumatic.  Eyes:     Conjunctiva/sclera: Conjunctivae normal.  Cardiovascular:     Rate and Rhythm: Normal rate and regular rhythm.     Heart sounds: Normal heart sounds.  Pulmonary:     Effort: Pulmonary effort is normal.     Breath sounds: Normal breath sounds.  Abdominal:     General: Bowel sounds are normal.     Palpations: Abdomen is soft.  Musculoskeletal:     Cervical back: Normal range of motion.  Lymphadenopathy:     Cervical: No cervical adenopathy.  Skin:     General: Skin is warm and dry.     Capillary Refill: Capillary refill takes less than 2 seconds.  Neurological:     Mental Status: She is alert and oriented to person, place, and time.  Psychiatric:        Behavior: Behavior normal.      Musculoskeletal Exam: Patient had some limitation with lateral rotation of the cervical spine.  Thoracic and lumbar spine 1 good range of motion.  Shoulders, elbows, wrists, MCPs PIPs and DIPs with good range of motion with no synovitis.  She had some limitation with abduction of her left hip without discomfort.  Right knee joint was in good range of motion.  Knee joints with good range of motion without any warmth swelling or effusion.  There was no tenderness  over ankles or MTPs.  CDAI Exam: CDAI Score: -- Patient Global: 0 / 100; Provider Global: 0 / 100 Swollen: --; Tender: -- Joint Exam 06/29/2023   No joint exam has been documented for this visit   There is currently no information documented on the homunculus. Go to the Rheumatology activity and complete the homunculus joint exam.  Investigation: No additional findings.  Imaging: No results found.  Recent Labs: Lab Results  Component Value Date   WBC 5.2 06/23/2023   HGB 12.5 06/23/2023   PLT 246 06/23/2023   NA 142 06/23/2023   K 4.2 06/23/2023   CL 110 06/23/2023   CO2 22 06/23/2023   GLUCOSE 116 (H) 06/23/2023   BUN 14 06/23/2023   CREATININE 0.82 06/23/2023   BILITOT 0.4 06/23/2023   ALKPHOS 100 05/29/2021   AST 17 06/23/2023   ALT 18 06/23/2023   PROT 6.1 06/23/2023   ALBUMIN 3.5 05/29/2021   CALCIUM 9.2 06/23/2023   GFRAA 100 09/03/2020   QFTBGOLDPLUS NEGATIVE 06/23/2023    Speciality Comments: PPD- 07/08/17 Negative, Humira  started August 06, 2020-inadequate response (last dose 12/24/20), Enbrel  started 01/15/21 (stopped 06/17/21), Actemra  started 06/24/21,MTX-high Cr 11/21  Procedures:  No procedures performed Allergies: Misc. sulfonamide containing compounds and  Sulfa antibiotics   Assessment / Plan:     Visit Diagnoses: Rheumatoid arthritis, seronegative, multiple sites (HCC)-patient denies having a flare of rheumatoid arthritis since the last visit.  She states she had a flare last summer requiring prednisone  taper.  She has been taking Actemra  and leflunomide  on a regular basis without any interruption.  She denies any shortness of breath.  She had no synovitis on the examination.  Lungs were clear to auscultation.  High risk medication use - Actemra  162 mg sq injections every 14 days (initiated on 06/24/2021) and  Arava  20 mg 1 tablet by mouth daily.TB gold negative on June 23, 2023.  CBC and CMP were normal on June 23, 2023.  She was advised to get labs every 3 months.  Information about immunization was placed in the AVS.  She was advised to hold Actemra  and leflunomide  if she develops an infection resume if infection resolves.  Primary osteoarthritis of both hands-she has rheumatoid arthritis and osteoarthritis overlap.  Some synovial thickening MCPs and PIP and DIP thickening was noted.  No active synovitis was noted.  Primary osteoarthritis of both knees-she gives history of intermittent discomfort in her knee joints.  No warmth swelling or effusion was noted.  Primary osteoarthritis of both feet-she had no tenderness over ankles or MTPs.  Spondylosis without myelopathy or radiculopathy, cervical region-she has limitation with lateral rotation.  She denies any discomfort today.  Age-related osteoporosis without current pathological fracture - DEXA 04/05/2022: Right femoral neck BMD 0.594, T-score -2.3. She is taking Fosamax  70 mg 1 tablet by mouth once weekly.  Patient was given a prescription for Fosamax  in 2021.  Patient states she has not been compliant with the Fosamax  use.  She states she has taken total of 6 months of Fosamax .  She plans to take Fosamax  over the next few months until her bone density in October.  She would prefer not to be on  Fosamax  after the next DEXA scan.  Calcium rich diet and regular exercise was emphasized.  History of vitamin D  deficiency-she has been taking vitamin D  on a regular basis.  Screen for hyperlipidemia-January 21, 2023 LDL 192, triglycerides 182.  Patient was given a prescription for Crestor by her PCP.  Patient  states she decided not to take it and will try more dietary modifications.  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 Lonn and ENT. S/P CTX and RTX.  Abnormal SPEP - Evaluated by Dr. Lonn  History of anemia  Orders: No orders of the defined types were placed in this encounter.  No orders of the defined types were placed in this encounter.    Follow-Up Instructions: Return in about 5 months (around 11/27/2023) for Rheumatoid arthritis, Osteoarthritis, Osteoporosis.   Maya Nash, MD  Note - This record has been created using Animal nutritionist.  Chart creation errors have been sought, but may not always  have been located. Such creation errors do not reflect on  the standard of medical care.

## 2023-06-23 ENCOUNTER — Other Ambulatory Visit: Payer: Self-pay | Admitting: *Deleted

## 2023-06-23 DIAGNOSIS — Z111 Encounter for screening for respiratory tuberculosis: Secondary | ICD-10-CM

## 2023-06-23 DIAGNOSIS — Z9225 Personal history of immunosupression therapy: Secondary | ICD-10-CM

## 2023-06-23 DIAGNOSIS — M810 Age-related osteoporosis without current pathological fracture: Secondary | ICD-10-CM

## 2023-06-23 DIAGNOSIS — Z8639 Personal history of other endocrine, nutritional and metabolic disease: Secondary | ICD-10-CM

## 2023-06-23 DIAGNOSIS — Z79899 Other long term (current) drug therapy: Secondary | ICD-10-CM

## 2023-06-24 NOTE — Progress Notes (Signed)
 CBC is normal except MCV is elevated.  Patient should take multivitamin and B12.  Lymphocyte count is low due to immunosuppression.  Vitamin D is in the normal range.  TB Gold is pending.

## 2023-06-25 LAB — QUANTIFERON-TB GOLD PLUS
Mitogen-NIL: >10 [IU]/mL
NIL: 0.02 [IU]/mL
QuantiFERON-TB Gold Plus: NEGATIVE
TB1-NIL: 0.01 [IU]/mL
TB2-NIL: 0.01 [IU]/mL

## 2023-06-25 LAB — CBC WITH DIFFERENTIAL/PLATELET
Absolute Lymphocytes: 796 {cells}/uL — ABNORMAL LOW (ref 850–3900)
Absolute Monocytes: 536 {cells}/uL (ref 200–950)
Basophils Absolute: 42 {cells}/uL (ref 0–200)
Basophils Relative: 0.8 %
Eosinophils Absolute: 83 {cells}/uL (ref 15–500)
Eosinophils Relative: 1.6 %
HCT: 37.4 % (ref 35.0–45.0)
Hemoglobin: 12.5 g/dL (ref 11.7–15.5)
MCH: 33.8 pg — ABNORMAL HIGH (ref 27.0–33.0)
MCHC: 33.4 g/dL (ref 32.0–36.0)
MCV: 101.1 fL — ABNORMAL HIGH (ref 80.0–100.0)
MPV: 10.7 fL (ref 7.5–12.5)
Monocytes Relative: 10.3 %
Neutro Abs: 3744 {cells}/uL (ref 1500–7800)
Neutrophils Relative %: 72 %
Platelets: 246 10*3/uL (ref 140–400)
RBC: 3.7 10*6/uL — ABNORMAL LOW (ref 3.80–5.10)
RDW: 11.9 % (ref 11.0–15.0)
Total Lymphocyte: 15.3 %
WBC: 5.2 10*3/uL (ref 3.8–10.8)

## 2023-06-25 LAB — COMPLETE METABOLIC PANEL WITH GFR
AG Ratio: 2.4 (calc) (ref 1.0–2.5)
ALT: 18 U/L (ref 6–29)
AST: 17 U/L (ref 10–35)
Albumin: 4.3 g/dL (ref 3.6–5.1)
Alkaline phosphatase (APISO): 71 U/L (ref 37–153)
BUN: 14 mg/dL (ref 7–25)
CO2: 22 mmol/L (ref 20–32)
Calcium: 9.2 mg/dL (ref 8.6–10.4)
Chloride: 110 mmol/L (ref 98–110)
Creat: 0.82 mg/dL (ref 0.60–1.00)
Globulin: 1.8 g/dL — ABNORMAL LOW (ref 1.9–3.7)
Glucose, Bld: 116 mg/dL — ABNORMAL HIGH (ref 65–99)
Potassium: 4.2 mmol/L (ref 3.5–5.3)
Sodium: 142 mmol/L (ref 135–146)
Total Bilirubin: 0.4 mg/dL (ref 0.2–1.2)
Total Protein: 6.1 g/dL (ref 6.1–8.1)
eGFR: 75 mL/min/{1.73_m2} (ref >=60)

## 2023-06-25 LAB — VITAMIN D 25 HYDROXY (VIT D DEFICIENCY, FRACTURES): Vit D, 25-Hydroxy: 39 ng/mL (ref 30–100)

## 2023-06-26 NOTE — Progress Notes (Signed)
 TB Gold is negative.

## 2023-06-29 ENCOUNTER — Encounter: Payer: Self-pay | Admitting: Rheumatology

## 2023-06-29 ENCOUNTER — Ambulatory Visit: Payer: Medicare Other | Attending: Rheumatology | Admitting: Rheumatology

## 2023-06-29 VITALS — BP 135/79 | HR 85 | Resp 14 | Ht 63.0 in | Wt 135.0 lb

## 2023-06-29 DIAGNOSIS — M19042 Primary osteoarthritis, left hand: Secondary | ICD-10-CM

## 2023-06-29 DIAGNOSIS — M170 Bilateral primary osteoarthritis of knee: Secondary | ICD-10-CM

## 2023-06-29 DIAGNOSIS — Z862 Personal history of diseases of the blood and blood-forming organs and certain disorders involving the immune mechanism: Secondary | ICD-10-CM

## 2023-06-29 DIAGNOSIS — Z8639 Personal history of other endocrine, nutritional and metabolic disease: Secondary | ICD-10-CM

## 2023-06-29 DIAGNOSIS — Z79899 Other long term (current) drug therapy: Secondary | ICD-10-CM | POA: Diagnosis not present

## 2023-06-29 DIAGNOSIS — M0609 Rheumatoid arthritis without rheumatoid factor, multiple sites: Secondary | ICD-10-CM

## 2023-06-29 DIAGNOSIS — M19072 Primary osteoarthritis, left ankle and foot: Secondary | ICD-10-CM

## 2023-06-29 DIAGNOSIS — Z13220 Encounter for screening for lipoid disorders: Secondary | ICD-10-CM

## 2023-06-29 DIAGNOSIS — M19041 Primary osteoarthritis, right hand: Secondary | ICD-10-CM | POA: Diagnosis not present

## 2023-06-29 DIAGNOSIS — M47812 Spondylosis without myelopathy or radiculopathy, cervical region: Secondary | ICD-10-CM

## 2023-06-29 DIAGNOSIS — M19071 Primary osteoarthritis, right ankle and foot: Secondary | ICD-10-CM

## 2023-06-29 DIAGNOSIS — M810 Age-related osteoporosis without current pathological fracture: Secondary | ICD-10-CM

## 2023-06-29 DIAGNOSIS — C320 Malignant neoplasm of glottis: Secondary | ICD-10-CM

## 2023-06-29 DIAGNOSIS — R778 Other specified abnormalities of plasma proteins: Secondary | ICD-10-CM

## 2023-06-29 NOTE — Patient Instructions (Addendum)
 Standing Labs We placed an order today for your standing lab work.   Please have your standing labs drawn in April and every 3 months  Please have your labs drawn 2 weeks prior to your appointment so that the provider can discuss your lab results at your appointment, if possible.  Please note that you may see your imaging and lab results in MyChart before we have reviewed them. We will contact you once all results are reviewed. Please allow our office up to 72 hours to thoroughly review all of the results before contacting the office for clarification of your results.  WALK-IN LAB HOURS  Monday through Thursday from 8:00 am -12:30 pm and 1:00 pm-5:00 pm and Friday from 8:00 am-12:00 pm.  Patients with office visits requiring labs will be seen before walk-in labs.  You may encounter longer than normal wait times. Please allow additional time. Wait times may be shorter on  Monday and Thursday afternoons.  We do not book appointments for walk-in labs. We appreciate your patience and understanding with our staff.   Labs are drawn by [facility]. Please bring your co-pay at the time of your lab draw.  You may receive a bill from [facility] for your lab work.  Please note if you are on Hydroxychloroquine and and an order has been placed for a Hydroxychloroquine level,  you will need to have it drawn 4 hours or more after your last dose.  If you wish to have your labs drawn at another location, please call the office 24 hours in advance so we can fax the orders.  The office is located at [address], [place], [place] [address]   If you have any questions regarding directions or hours of operation,  please call [phone].   As a reminder, please drink plenty of water prior to coming for your lab work. Thanks!   Vaccines You are taking a medication(s) that can suppress your immune system.  The following immunizations are recommended: Flu annually Covid-19  RSV Td/Tdap (tetanus,  diphtheria, pertussis) every 10 years Pneumonia (Prevnar 15 then Pneumovax 23 at least 1 year apart.  Alternatively, can take Prevnar 20 without needing additional dose) Shingrix: 2 doses from 4 weeks to 6 months apart  Please check with your PCP to make sure you are up to date.   If you have signs or symptoms of an infection or start antibiotics: First, call your PCP for workup of your infection. Hold your medication through the infection, until you complete your antibiotics, and until symptoms resolve if you take the following: Injectable medication (Actemra , Benlysta, Cimzia, Cosentyx, Enbrel , Humira , Kevzara, Orencia , Remicade, Simponi, Stelara, Taltz, Tremfya) Methotrexate  Leflunomide  (Arava ) Mycophenolate (Cellcept) Earma, Olumiant, or Rinvoq  Heart Disease Prevention   Your inflammatory disease increases your risk of heart disease which includes heart attack, stroke, atrial fibrillation (irregular heartbeats), high blood pressure, heart failure and atherosclerosis (plaque in the arteries).  It is important to reduce your risk by:   Keep blood pressure, cholesterol, and blood sugar at healthy levels   Smoking Cessation   Maintain a healthy weight  BMI 20-25   Eat a healthy diet  Plenty of fresh fruit, vegetables, and whole grains  Limit saturated fats, foods high in sodium, and added sugars  DASH and Mediterranean diet   Increase physical activity  Recommend moderate physically activity for 150 minutes per week/ 30 minutes a day for five days a week These can be broken up into three separate ten-minute  sessions during the day.   Reduce Stress  Meditation, slow breathing exercises, yoga, coloring books  Dental visits twice a year

## 2023-07-15 ENCOUNTER — Other Ambulatory Visit: Payer: Self-pay | Admitting: Physician Assistant

## 2023-07-15 NOTE — Telephone Encounter (Signed)
Last Fill: 04/18/2023  Labs: 06/23/2023 CBC is normal except MCV is elevated.  Lymphocyte count is low due to immunosuppression.     Next Visit: 11/29/2023  Last Visit: 06/29/2023  DX: Rheumatoid arthritis, seronegative, multiple sites   Current Dose per office note 06/29/2023: Arava 20 mg 1 tablet by mouth daily   Okay to refill Arava ?

## 2023-07-21 ENCOUNTER — Other Ambulatory Visit: Payer: Self-pay | Admitting: Physician Assistant

## 2023-07-21 NOTE — Telephone Encounter (Signed)
Last Fill: 05/02/2023  Labs: 06/23/2023  CBC is normal except MCV is elevated.  Patient should take multivitamin and B12.  Lymphocyte count is low due to immunosuppression.  Vitamin D is in the normal range.  TB Gold is negative.   Next Visit: 11/29/2023  Last Visit: 06/29/2023  DX: Age-related osteoporosis without current pathological fracture   Current Dose per office note 06/29/2023: Fosamax 70 mg 1 tablet by mouth once weekly   Okay to refill Fosamax?

## 2023-07-26 ENCOUNTER — Telehealth: Payer: Self-pay | Admitting: *Deleted

## 2023-07-26 NOTE — Telephone Encounter (Signed)
Patient contacted the office requesting a call back from [facility]. Patient states she has a few questions about the patient assistance.

## 2023-07-29 NOTE — Progress Notes (Signed)
Office Visit Note  Patient: Jean [name]             Date of Birth: 07-Jul-1945           MRN: [id number]             PCP: Estevan Oaks, NP Referring: Estevan Oaks, NP Visit Date: 08/02/2023 Occupation: @GUAROCC @  Subjective:  Left-sided neck pain  History of Present Illness: Jean [name] is a 78 y.o. female with history of seronegative rheumatoid arthritis and osteoarthritis.  Patient remains on Actemra 162 mg sq injections every 14 days (initiated on 06/24/2021) and Arava 20 mg 1 tablet by mouth daily.  Patient states that her last dose of Actemra was administered on Sunday but states that she does not think she will be able to continue Actemra due to changes with insurance and cost.  She presents today to discuss treatment alternatives.  She denies any signs or symptoms of a rheumatoid arthritis flare.  She is having some increased left-sided neck pain and states that she has had muscle spasms intermittently.  She has also had a burning pain radiating at times.  She denies any other joint pain or joint swelling at this time.    Activities of Daily Living:  Patient reports morning stiffness for 30 minutes.   Patient Denies nocturnal pain.  Difficulty dressing/grooming: Denies Difficulty climbing stairs: Denies Difficulty getting out of chair: Denies Difficulty using hands for taps, buttons, cutlery, and/or writing: Denies  Review of Systems  Constitutional:  Negative for fatigue.  HENT:  Positive for mouth dryness. Negative for mouth sores.   Eyes:  Negative for dryness.  Respiratory:  Negative for shortness of breath.   Cardiovascular:  Negative for chest pain and palpitations.  Gastrointestinal:  Negative for blood in stool, constipation and diarrhea.  Endocrine: Negative for increased urination.  Genitourinary:  Negative for involuntary urination.  Musculoskeletal:  Positive for joint pain, joint pain, joint swelling, morning stiffness and muscle  tenderness. Negative for gait problem, myalgias, muscle weakness and myalgias.  Skin:  Negative for color change, rash, hair loss and sensitivity to sunlight.  Allergic/Immunologic: Negative for susceptible to infections.  Neurological:  Negative for dizziness and headaches.  Hematological:  Negative for swollen glands.  Psychiatric/Behavioral:  Negative for depressed mood and sleep disturbance. The patient is not nervous/anxious.     PMFS History:  Patient Active Problem List   Diagnosis Date Noted  . Laryngeal cancer (HCC) 12/07/2022  . History of head and neck radiation 12/07/2022  . Osteoporosis 02/27/2021  . Essential hypertension 02/27/2021  . Gastritis 09/08/2020  . Iron deficiency anemia 09/08/2020  . Preventive measure 05/20/2020  . MGUS (monoclonal gammopathy of unknown significance) 12/25/2019  . Acquired hypothyroidism 12/25/2019  . Protein malnutrition (HCC) 05/16/2019  . Constipation due to opioid therapy 05/09/2019  . Port-A-Cath in place 05/02/2019  . 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 Vitamin D deficiency 01/23/2017  . Rheumatoid arthritis, seronegative, multiple sites (HCC) 01/19/2017  . High risk medication use 01/19/2017  . Primary osteoarthritis of both hands 01/19/2017  . Primary osteoarthritis of both knees 01/19/2017  . Fatigue 01/19/2017  . Thrombocytosis 01/19/2017  . Primary osteoarthritis of both feet 01/19/2017  . Deficiency anemia 01/19/2017  . Screening-pulmonary TB 04/26/2016    Past Medical History:  Diagnosis Date  . Arthritis   . Cancer (HCC)   . History of radiation therapy 05/03/19- 06/25/19   Larynx 35 fractions of 2  Gy each to total 70 Gy  . Hypertension   . Ingrown toenail   . Rheumatoid arthritis (HCC)   . Vocal cord mass     Family History  Problem Relation Age of Onset  . Stroke Mother   . Hypertension Daughter   . Colon polyps Neg Hx   . Colon cancer Neg Hx   . Esophageal cancer Neg Hx   .  Rectal cancer Neg Hx   . Stomach cancer Neg Hx    Past Surgical History:  Procedure Laterality Date  . ECTOPIC PREGNANCY SURGERY    . EXTERNAL EAR SURGERY Right    removal of cyst or gland  . IR GASTROSTOMY TUBE MOD SED  04/26/2019  . IR GASTROSTOMY TUBE REMOVAL  11/05/2019  . IR IMAGING GUIDED PORT INSERTION  04/26/2019  . IR REMOVAL TUN ACCESS W/ PORT W/O FL MOD SED  11/05/2019  . MICROLARYNGOSCOPY Left 03/26/2019   Procedure: DIRECT MICROLARYNGOSCOPY WITH BIOPSY OF VOCAL CORD MASS;  Surgeon: Newman Pies, MD;  Location: [facility];  Service: ENT;  Laterality: Left;   Social History   Social History Narrative   Patient was recently widowed in March 2020.   Patient has 1 daughter who lives in [place]   Patient moved to [place] from [place] in January 2009.   Patient is a retired [employer] fund[employer].   Immunization History  Administered Date(s) Administered  . Fluad Quad(high Dose 65+) 05/20/2020  . Influenza, High Dose Seasonal PF 06/09/2017  . PFIZER(Purple Top)SARS-COV-2 Vaccination 08/29/2019, 09/19/2019, 07/11/2020     Objective: Vital Signs: BP 122/74 (BP Location: Left Arm, Patient Position: Sitting, Cuff Size: Normal)   Pulse 80   Resp 14   Ht 5\' 3"  (1.6 m)   Wt 135 lb (61.2 kg)   BMI 23.91 kg/m    Physical Exam Vitals and nursing note reviewed.  Constitutional:      Appearance: She is well-developed.  HENT:     Head: Normocephalic and atraumatic.  Eyes:     Conjunctiva/sclera: Conjunctivae normal.  Cardiovascular:     Rate and Rhythm: Normal rate and regular rhythm.     Heart sounds: Normal heart sounds.  Pulmonary:     Effort: Pulmonary effort is normal.     Breath sounds: Normal breath sounds.  Abdominal:     General: Bowel sounds are normal.     Palpations: Abdomen is soft.  Musculoskeletal:     Cervical back: Normal range of motion.  Lymphadenopathy:     Cervical: No cervical adenopathy.  Skin:    General: Skin is warm and dry.      Capillary Refill: Capillary refill takes less than 2 seconds.  Neurological:     Mental Status: She is alert and oriented to person, place, and time.  Psychiatric:        Behavior: Behavior normal.     Musculoskeletal Exam: C-spine has limited ROM with lateral rotation to the left.  Left trapezius muscle tension and tenderness.  Shoulder joints have good ROM with no tenderness.  Elbow joints, wrist joints, MCPs, PIPs, and DIPs good ROM with no synovitis.  Complete fist formation bilaterally.  Hip joints have good ROM with no groin pain.  Knee joints have good ROM with no warmth or effusion.  Ankle joints have good ROM with no tenderness or joint swelling.   CDAI Exam: CDAI Score: -- Patient Global: --; Provider Global: -- Swollen: --; Tender: -- Joint Exam 08/02/2023   No joint  exam has been documented for this visit   There is currently no information documented on the homunculus. Go to the Rheumatology activity and complete the homunculus joint exam.  Investigation: No additional findings.  Imaging: No results found.  Recent Labs: Lab Results  Component Value Date   WBC 5.2 06/23/2023   HGB 12.5 06/23/2023   PLT 246 06/23/2023   NA 142 06/23/2023   K 4.2 06/23/2023   CL 110 06/23/2023   CO2 22 06/23/2023   GLUCOSE 116 (H) 06/23/2023   BUN 14 06/23/2023   CREATININE 0.82 06/23/2023   BILITOT 0.4 06/23/2023   ALKPHOS 100 05/29/2021   AST 17 06/23/2023   ALT 18 06/23/2023   PROT 6.1 06/23/2023   ALBUMIN 3.5 05/29/2021   CALCIUM 9.2 06/23/2023   GFRAA 100 09/03/2020   QFTBGOLDPLUS NEGATIVE 06/23/2023    Speciality Comments: PPD- 07/08/17 Negative, Humira started August 06, 2020-inadequate response (last dose 12/24/20), Enbrel started 01/15/21 (stopped 06/17/21), Actemra started 06/24/21,MTX-high Cr 11/21  Procedures:  No procedures performed Allergies: Misc. sulfonamide containing compounds and Sulfa antibiotics   Assessment / Plan:     Visit Diagnoses: Rheumatoid  arthritis, seronegative, multiple sites [facility]): She has no synovitis on examination today.  She has not had any recent rheumatoid arthritis flares.  She has clinically been doing well on Actemra 162 mg subcutaneous injections every 14 days and Arava 20 mg daily.  She is tolerating combination therapy without any side effects and has not had any recent gaps in therapy.  Her most recent dose of Actemra was administered on Sunday 07/31/23--she no longer qualifies for the patient assistance program for Actemra so she presents today to discuss treatment options.  According to the patient her insurance states that the 2 preferred agents would be Avsola and Inflectra.  She does not want to proceed with an IV infusion at this time.  Plan to apply for [facility] patient assistance.  Indications, contraindications, potential side effects of Carlis Abbott were discussed today in detail.  All questions were addressed and consent was obtained.  If approved for Carlis Abbott she will return to the office for administration of the first injection.  She will require updated lab work in 1 month and every 3 months after initiating Kevzara.  She will remain on Arava as combination therapy.  She was advised to notify us if she develops any new or worsening symptoms.  Medication counseling:  TB Test: Negative 06/23/23.  Hepatitis panel: Hep B negative 08/06/20. Hep C negative 08/06/20.  HIV: negative on 11/30/13  SPEP: 11/30/13  Immunoglobulins: Wnl 11/30/13   Lipid panel:  updated 01/21/23.   Chest x-ray: no acute cardiopulmonary disease 11/30/13   Counseled patient that Carlis Abbott is an IL-6 blocking agent.  Counseled patient on purpose, proper use, and adverse effects of Kevzara.  Reviewed the most common adverse effects including infections, injection site reaction, bowel injury, and rarely cancer.  Reviewed that the medication should be held during infections.  Discussed that there is the possibility of an increased risk of malignancy but it is not  well understood if this increased risk is due to the medication or the disease state.  Reviewed the importance of regular labs while on Kevzara therapy including the need for routine lipid panel.  Advised patient to get standing labs one month after starting Kevzara.  Provided patient with standing lab orders.  Counseled patient that Carlis Abbott should be held prior to scheduled surgery.  Counseled patient to avoid live vaccines while on Kevzara.  Advised patient to get annual influenza vaccine and the pneumococcal vaccine.  Provided patient with medication education material and answered all questions.  Patient voiced understanding.  Patient consented to [facility].  Will upload consent into the media tab.  Reviewed storage instructions of Carlis Abbott with patient.  Advised it should be kept in the refrigerator until she is ready for use.  Advised patient that initial Kevzara injection must be given in the office.  Will apply for Kevzara through patient's insurance.    High risk medication use - Plan to apply for Kevzara. She will remain on Arava 20 mg 1 tablet by mouth daily. CBC and CMP updated on 06/23/23.  She will require updated lab work 1 month and every 3 months after initiating Kevzara. TB gold negative on 06/23/23.  Discussed the importance of holding Kevzara and arava if she develops signs or symptoms of an infection and to resume once the infection has completely cleared.  Previous therapy: Actemra 162 mg sq injections every 14 days (initiated on 06/24/2021)-discontinued due to cost.   Primary osteoarthritis of both hands: No synovitis noted.   Primary osteoarthritis of both knees: No warmth or effusion noted.  Primary osteoarthritis of both feet: She is not experiencing any increased discomfort in her feet at this time.  She is good range of motion of both ankle joints with no tenderness or joint swelling.  Spondylosis without myelopathy or radiculopathy, cervical region: Patient presents today with  increased pain and stiffness in the cervical spine.  She has had left-sided discomfort specifically in the left trapezius muscle.  She has been experiencing muscle spasms intermittently.  A prescription for methocarbamol 500 mg 1 tablet daily as needed for muscle spasms sent to the pharmacy as well as a prednisone taper starting 40 mg tapering by 5 mg every 2 days.  Patient was advised to notify us if her symptoms persist or worsen.  She plans on continuing home exercises.  She would like to hold off on formal physical therapy at this time.  Age-related osteoporosis without current pathological fracture - DEXA 04/05/2022: Right femoral neck BMD 0.594, T-score -2.3. She is taking Fosamax 70 mg 1 tablet by mouth once weekly and vitamin D 2000 units daily. Due to update DEXA in October 2025.  History of vitamin D deficiency: She is taking vitamin D 2000 units daily.  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 Bertis Ruddy and ENT. S/P CTX and RTX.  Abnormal SPEP - Evaluated by Dr. Bertis Ruddy  History of anemia  Orders: No orders of the defined types were placed in this encounter.  Meds ordered this encounter  Medications  . predniSONE (DELTASONE) 5 MG tablet    Sig: Take 4 tabs po x 2 days, 3  tabs po x 2 days, 2  tabs po x 2 days, 1  tab po x 2 days    Dispense:  20 tablet    Refill:  0  . methocarbamol (ROBAXIN) 500 MG tablet    Sig: Take 1 tablet (500 mg total) by mouth daily as needed.    Dispense:  14 tablet    Refill:  0      Follow-Up Instructions: Return for Rheumatoid arthritis.   Gearldine Bienenstock, PA-C  Note - This record has been created using Dragon software.  Chart creation errors have been sought, but may not always  have been located. Such creation errors do not reflect on  the standard of medical care.

## 2023-08-01 ENCOUNTER — Other Ambulatory Visit (HOSPITAL_COMMUNITY): Payer: Self-pay

## 2023-08-01 NOTE — Telephone Encounter (Signed)
 Returned call to patient - she states she's been pushed out of program for Actemra  SQ. She states that she talked to insurance and they provided her with names of two other medications.  I have advised patient that all medication patient assistance programs have addended income criteria due to Medicare changes this year.   She has OV tomorrow, 08/02/2023 and will discuss treatment options at that visit.  Per test claim, copay is $886.29 for 28 day supply of Actemra  SQ  Isauro Skelley, PharmD, MPH, BCPS, CPP Clinical Pharmacist (Rheumatology and Pulmonology)

## 2023-08-02 ENCOUNTER — Encounter: Payer: Self-pay | Admitting: Physician Assistant

## 2023-08-02 ENCOUNTER — Ambulatory Visit: Payer: Medicare Other | Attending: Physician Assistant | Admitting: Physician Assistant

## 2023-08-02 VITALS — BP 122/74 | HR 80 | Resp 14 | Ht 63.0 in | Wt 135.0 lb

## 2023-08-02 DIAGNOSIS — Z862 Personal history of diseases of the blood and blood-forming organs and certain disorders involving the immune mechanism: Secondary | ICD-10-CM

## 2023-08-02 DIAGNOSIS — M19072 Primary osteoarthritis, left ankle and foot: Secondary | ICD-10-CM

## 2023-08-02 DIAGNOSIS — Z79899 Other long term (current) drug therapy: Secondary | ICD-10-CM | POA: Diagnosis not present

## 2023-08-02 DIAGNOSIS — M19041 Primary osteoarthritis, right hand: Secondary | ICD-10-CM

## 2023-08-02 DIAGNOSIS — M170 Bilateral primary osteoarthritis of knee: Secondary | ICD-10-CM | POA: Diagnosis not present

## 2023-08-02 DIAGNOSIS — R778 Other specified abnormalities of plasma proteins: Secondary | ICD-10-CM

## 2023-08-02 DIAGNOSIS — M19071 Primary osteoarthritis, right ankle and foot: Secondary | ICD-10-CM

## 2023-08-02 DIAGNOSIS — M47812 Spondylosis without myelopathy or radiculopathy, cervical region: Secondary | ICD-10-CM

## 2023-08-02 DIAGNOSIS — M19042 Primary osteoarthritis, left hand: Secondary | ICD-10-CM

## 2023-08-02 DIAGNOSIS — Z8639 Personal history of other endocrine, nutritional and metabolic disease: Secondary | ICD-10-CM

## 2023-08-02 DIAGNOSIS — M0609 Rheumatoid arthritis without rheumatoid factor, multiple sites: Secondary | ICD-10-CM

## 2023-08-02 DIAGNOSIS — C320 Malignant neoplasm of glottis: Secondary | ICD-10-CM

## 2023-08-02 DIAGNOSIS — M810 Age-related osteoporosis without current pathological fracture: Secondary | ICD-10-CM

## 2023-08-02 MED ORDER — METHOCARBAMOL 500 MG PO TABS: 500.0000 mg | ORAL_TABLET | Freq: Every day | ORAL | 0 refills | Status: AC | PRN

## 2023-08-02 MED ORDER — PREDNISONE 5 MG PO TABS: ORAL_TABLET | ORAL | 0 refills | Status: AC

## 2023-08-02 NOTE — Patient Instructions (Signed)
Sarilumab Injection What is this medication? SARILUMAB (sar IL ue mab) treats autoimmune conditions, such as arthritis. It works by slowing down an overactive immune system. It is a monoclonal antibody. This medicine may be used for other purposes; ask your health care provider or pharmacist if you have questions. COMMON BRAND NAME(S): KEVZARA What should I tell my care team before I take this medication? They need to know if you have any of these conditions: Cancer Diabetes Diverticulitis Hepatitis B or history of hepatitis B infection High cholesterol Immune system problems Infection, especially a viral infection, such as chickenpox, cold sores, herpes Liver disease Low blood cell levels (white cells, platelets, or red blood cells) Recent or upcoming vaccine Scheduled to have surgery Stomach or intestine problems Tuberculosis, a positive skin test for tuberculosis, or recent close contact with someone who has tuberculosis An unusual or allergic reaction to sarilumab, other medications, foods, dyes, or preservatives Pregnant or trying to get pregnant Breast-feeding How should I use this medication? This medication is injected under the skin. You will be taught how to prepare and give it. Take it as directed on the prescription label. Keep taking it unless your care team tells you to stop. It is important that you put your used needles and syringes in a special sharps container. Do not put them in a trash can. If you do not have a sharps container, call your pharmacist or care team to get one. A special MedGuide will be given to you by the pharmacist with each prescription and refill. Be sure to read this information carefully each time. Talk to your care team about the use of this medication in children. While it may be prescribed for children for selected conditions, precautions do apply. Overdosage: If you think you have taken too much of this medicine contact a poison control center  or emergency room at once. NOTE: This medicine is only for you. Do not share this medicine with others. What if I miss a dose? If you miss a dose, take it as soon as you can. If it is almost time for your next dose, take only that dose. Do not take double or extra doses. What may interact with this medication? Do not take this medication with any of the following: Live virus vaccines This medication may also interact with the following: Biologic medications, such as abatacept, adalimumab, anakinra, certolizumab, etanercept, golimumab, infliximab, ofatumumab, rituximab, secukinumab, tocilizumab, tofacitinib, ustekinumab This medication may affect how other medications work. Talk with your care team about all the medications you take. They may suggest changes to your treatment plan to lower the risk of side effects and to make sure your medications work as intended. This list may not describe all possible interactions. Give your health care provider a list of all the medicines, herbs, non-prescription drugs, or dietary supplements you use. Also tell them if you smoke, drink alcohol, or use illegal drugs. Some items may interact with your medicine. What should I watch for while using this medication? Visit your care team for regular checks on your progress. Tell your care team if your symptoms do not start to get better or if they get worse. You may need blood work done while you are taking this medication. This medication can increase your risk of getting an infection. Call your care team for advice if you get a fever, chills, sore throat, or other symptoms of a cold or flu. Do not treat yourself. Try to avoid being around people who are  sick. If you have not had the measles or chickenpox vaccines, tell your care team right away if you are around someone with these viruses. If you are going to need surgery or other procedure, tell your care team that you are using this medication. Talk to your care team  about your risk of cancer. You may be at risk for certain types of cancers if you take this medication. What side effects may I notice from receiving this medication? Side effects that you should report to your care team as soon as possible: Allergic reactions--skin rash, itching, hives, swelling of the face, lips, tongue, or throat Infection--fever, chills, cough, sore throat, wounds that don't heal, pain or trouble when passing urine, general feeling of discomfort or being unwell Stomach pain that is severe, does not go away, or gets worse Unusual bruising or bleeding Side effects that usually do not require medical attention (report to your care team if they continue or are bothersome): Pain, redness, or irritation at injection site Runny or stuffy nose Sore throat This list may not describe all possible side effects. Call your doctor for medical advice about side effects. You may report side effects to FDA at 1-800-FDA-1088. Where should I keep my medication? Keep out of the reach of children and pets. Store in a refrigerator or at room temperature between 20 and 25 degrees C (68 and 77 degrees F). Refrigeration (preferred): Store in the refrigerator. Do not freeze. Protect from light. Keep in the original container until you are ready to take it. Get rid of any unused medication after the expiration date. Room temperature: This medication may be stored at room temperature for up to 14 days. Protect from light. Keep it in the original carton until you are ready to take it. Get rid of any unused medication after 14 days or after it expires, whichever is first. To get rid of medications that are no longer needed or have expired: Take the medication to a medication take-back program. Ask your pharmacy or law enforcement to find a location. If you cannot return the medication, ask your pharmacist or care team how to get rid of this medication safely. NOTE: This sheet is a summary. It may not  cover all possible information. If you have questions about this medicine, talk to your doctor, pharmacist, or health care provider.  2024 Elsevier/Gold Standard (2022-12-02 00:00:00)

## 2023-08-03 ENCOUNTER — Other Ambulatory Visit (HOSPITAL_COMMUNITY): Payer: Self-pay

## 2023-08-03 ENCOUNTER — Telehealth: Payer: Self-pay | Admitting: Pharmacist

## 2023-08-03 NOTE — Telephone Encounter (Addendum)
Submitted a Prior Authorization request to [facility] for [facility] via CoverMyMeds. Will update once we receive a response.  Key: [id number]  Chesley Mires, PharmD, MPH, BCPS, CPP Clinical Pharmacist (Rheumatology and Pulmonology)  ----- Message from [facility] Marissa G sent at 08/02/2023  2:04 PM EST ----- Please apply for Carlis Abbott, per Sherron Ales, PA-C. Thanks!   Consent obtained and sent to the scan center.

## 2023-08-12 NOTE — Telephone Encounter (Signed)
Received a fax regarding Prior Authorization from [facility] for [facility]. Authorization has been DENIED because patient must try and fail or be unable to take Orencia.  I do not see any contraindications or concerns with trying Orencia. Routing to Sherron Ales, PA-C for advisement  Unfortunately Orencia's patient assistance program requires patients to have paid 3% of household income out-of-pocket for prescription drugs so this may end up not being an option either  Chesley Mires, PharmD, MPH, BCPS, CPP Clinical Pharmacist (Rheumatology and Pulmonology)

## 2023-08-16 NOTE — Telephone Encounter (Signed)
 Ok to proceed with orencia if the patient is ok with it

## 2023-08-30 HISTORY — PX: TYMPANOSTOMY TUBE PLACEMENT: SHX32

## 2023-09-05 ENCOUNTER — Telehealth: Payer: Self-pay | Admitting: Pharmacist

## 2023-09-05 NOTE — Telephone Encounter (Signed)
 Spoke with patient - reviewed Kevzara denial. She is fine with moving forward with Orencia. Denies history of COPD or emphysema or lung cancers  Chesley Mires, PharmD, MPH, BCPS, CPP Clinical Pharmacist (Rheumatology and Pulmonology)

## 2023-09-05 NOTE — Telephone Encounter (Signed)
 Jean [name] denied by insurance  Patient comfortable with moving forward with Orencia. Will obtain consent at new start visit  Submitted a Prior Authorization request to [facility] for ORENCIA SQ via CoverMyMeds. Will update once we receive a response.  Key: [id number]

## 2023-10-11 ENCOUNTER — Other Ambulatory Visit: Payer: Self-pay | Admitting: Physician Assistant

## 2023-10-11 NOTE — Telephone Encounter (Signed)
 Last Fill: 07/21/2023  Labs: 06/23/2023 CBC is normal except MCV is elevated.  Patient should take multivitamin and B12.  Lymphocyte count is low due to immunosuppression.  Vitamin D  is in the normal range.   Next Visit: 11/29/2023  Last Visit: 08/02/2023  DX: Age-related osteoporosis without current pathological fracture   Current Dose per office note 08/02/2023: Fosamax  70 mg 1 tablet by mouth once weekly   Okay to refill Fosamax ?

## 2023-10-13 ENCOUNTER — Other Ambulatory Visit: Payer: Self-pay | Admitting: Physician Assistant

## 2023-10-13 DIAGNOSIS — Z79899 Other long term (current) drug therapy: Secondary | ICD-10-CM

## 2023-10-13 NOTE — Telephone Encounter (Signed)
 Last Fill: 07/15/2023  Labs: 06/23/2023 CBC is normal except MCV is elevated.  Patient should take multivitamin and B12.  Lymphocyte count is low due to immunosuppression.   Next Visit: 11/29/2023  Last Visit: 08/02/2023  DX: Rheumatoid arthritis, seronegative, multiple sites   Current Dose per office note 08/02/2023: Arava  20 mg 1 tablet by mouth daily.   Patient advised she is due to update labs. Patient states she will be in to update today or tomorrow.   Okay to refill Arava  ?

## 2023-10-17 ENCOUNTER — Other Ambulatory Visit: Payer: Self-pay | Admitting: *Deleted

## 2023-10-17 DIAGNOSIS — Z79899 Other long term (current) drug therapy: Secondary | ICD-10-CM

## 2023-10-18 LAB — COMPREHENSIVE METABOLIC PANEL WITH GFR
AG Ratio: 2 (calc) (ref 1.0–2.5)
ALT: 10 U/L (ref 6–29)
AST: 14 U/L (ref 10–35)
Albumin: 4.3 g/dL (ref 3.6–5.1)
Alkaline phosphatase (APISO): 88 U/L (ref 37–153)
BUN: 9 mg/dL (ref 7–25)
CO2: 26 mmol/L (ref 20–32)
Calcium: 9.6 mg/dL (ref 8.6–10.4)
Chloride: 107 mmol/L (ref 98–110)
Creat: 0.83 mg/dL (ref 0.60–1.00)
Globulin: 2.2 g/dL (ref 1.9–3.7)
Glucose, Bld: 86 mg/dL (ref 65–99)
Potassium: 4.9 mmol/L (ref 3.5–5.3)
Sodium: 141 mmol/L (ref 135–146)
Total Bilirubin: 0.3 mg/dL (ref 0.2–1.2)
Total Protein: 6.5 g/dL (ref 6.1–8.1)
eGFR: 73 mL/min/{1.73_m2} (ref >=60)

## 2023-10-18 LAB — CBC WITH DIFFERENTIAL/PLATELET
Absolute Lymphocytes: 714 {cells}/uL — ABNORMAL LOW (ref 850–3900)
Absolute Monocytes: 552 {cells}/uL (ref 200–950)
Basophils Absolute: 42 {cells}/uL (ref 0–200)
Basophils Relative: 0.7 %
Eosinophils Absolute: 72 {cells}/uL (ref 15–500)
Eosinophils Relative: 1.2 %
HCT: 34.5 % — ABNORMAL LOW (ref 35.0–45.0)
Hemoglobin: 11.1 g/dL — ABNORMAL LOW (ref 11.7–15.5)
MCH: 31.4 pg (ref 27.0–33.0)
MCHC: 32.2 g/dL (ref 32.0–36.0)
MCV: 97.7 fL (ref 80.0–100.0)
MPV: 9.5 fL (ref 7.5–12.5)
Monocytes Relative: 9.2 %
Neutro Abs: 4620 {cells}/uL (ref 1500–7800)
Neutrophils Relative %: 77 %
Platelets: 414 10*3/uL — ABNORMAL HIGH (ref 140–400)
RBC: 3.53 10*6/uL — ABNORMAL LOW (ref 3.80–5.10)
RDW: 12.2 % (ref 11.0–15.0)
Total Lymphocyte: 11.9 %
WBC: 6 10*3/uL (ref 3.8–10.8)

## 2023-10-18 NOTE — Progress Notes (Signed)
 CMP WNL RBC count, hgb, and hct are low.  Any recent bleeding? Blood in stool? Absolute lymphocytes are low-stable.  Plt count is borderline elevated.

## 2023-10-27 ENCOUNTER — Other Ambulatory Visit (HOSPITAL_COMMUNITY): Payer: Self-pay

## 2023-10-27 NOTE — Telephone Encounter (Signed)
 Received notification from [facility] regarding a prior authorization for ORENCIA SQ. Authorization has been APPROVED from 09/05/2023 to 03/07/2024. Approval letter sent to scan center.  Per test claim, copay for 28 days supply is $1910.46  Authorization # [id number]  Can apply for Orencia BMS pt assistance but patient needs to have spent 3% of household income on prescription drugs this year  Geraldene Kleine, PharmD, MPH, BCPS, CPP Clinical Pharmacist (Rheumatology and Pulmonology)

## 2023-10-28 NOTE — Telephone Encounter (Signed)
 Enrolled patient into RA grant through [facility] [employer]: ID: [id number] Eligibility start date: 07/29/2023 Group ID: [id number] Eligibility end date: 10/25/2024 RxBin ID: [id number] Assistance amount: $2500 PCN: [id number]

## 2023-10-28 NOTE — Telephone Encounter (Signed)
 Patient scheduled for Orencia new start on 10/31/23. Will use sample and send refills to [facility]

## 2023-10-30 NOTE — Progress Notes (Unsigned)
 Pharmacy Note  Subjective:   Patient presents to clinic today to receive first dose of Orencia for rheumatoid arthrits. Patient currently takes Leflunomide .  Was previously on Actemra .   Patient running a fever or have signs/symptoms of infection? {yes/no:20286}  Patient currently on antibiotics for the treatment of infection? {yes/no:20286}  Patient have any upcoming invasive procedures/surgeries? {yes/no:20286}  Objective: CMP     Component Value Date/Time   NA 141 10/17/2023 1049   NA 141 03/05/2014 1050   K 4.9 10/17/2023 1049   K 3.9 03/05/2014 1050   CL 107 10/17/2023 1049   CO2 26 10/17/2023 1049   CO2 24 03/05/2014 1050   GLUCOSE 86 10/17/2023 1049   GLUCOSE 99 03/05/2014 1050   BUN 9 10/17/2023 1049   BUN 10.6 03/05/2014 1050   CREATININE 0.83 10/17/2023 1049   CREATININE 0.8 03/05/2014 1050   CALCIUM 9.6 10/17/2023 1049   CALCIUM 8.8 03/05/2014 1050   PROT 6.5 10/17/2023 1049   PROT 6.5 03/05/2014 1050   ALBUMIN 3.5 05/29/2021 0818   ALBUMIN 3.2 (L) 03/05/2014 1050   AST 14 10/17/2023 1049   AST 13 (L) 10/10/2019 1402   AST 12 03/05/2014 1050   ALT 10 10/17/2023 1049   ALT 10 10/10/2019 1402   ALT 28 03/05/2014 1050   ALKPHOS 100 05/29/2021 0818   ALKPHOS 85 03/05/2014 1050   BILITOT 0.3 10/17/2023 1049   BILITOT <0.2 (L) 10/10/2019 1402   BILITOT 0.20 03/05/2014 1050   GFRNONAA >60 05/29/2021 0818   GFRNONAA 87 09/03/2020 1045   GFRAA 100 09/03/2020 1045    CBC    Component Value Date/Time   WBC 6.0 10/17/2023 1049   RBC 3.53 (L) 10/17/2023 1049   HGB 11.1 (L) 10/17/2023 1049   HGB 10.6 (L) 10/10/2019 1402   HGB 11.5 (L) 07/31/2014 0923   HCT 34.5 (L) 10/17/2023 1049   HCT 36.6 07/31/2014 0923   PLT 414 (H) 10/17/2023 1049   PLT 268 10/10/2019 1402   PLT 478 (H) 07/31/2014 0923   MCV 97.7 10/17/2023 1049   MCV 96.4 07/31/2014 0923   MCH 31.4 10/17/2023 1049   MCHC 32.2 10/17/2023 1049   RDW 12.2 10/17/2023 1049   RDW 17.2 (H) 07/31/2014  0923   LYMPHSABS 910 01/21/2023 1126   LYMPHSABS 1.3 07/31/2014 0923   MONOABS 0.4 05/29/2021 0818   MONOABS 0.6 07/31/2014 0923   EOSABS 72 10/17/2023 1049   EOSABS 0.0 07/31/2014 0923   BASOSABS 42 10/17/2023 1049   BASOSABS 0.1 07/31/2014 0923    Baseline Immunosuppressant Therapy Labs TB GOLD    Latest Ref Rng & Units 06/23/2023    9:38 AM  Quantiferon TB Gold  Quantiferon TB Gold Plus NEGATIVE NEGATIVE    Hepatitis Panel    Latest Ref Rng & Units 08/06/2020   10:59 AM  Hepatitis  Hep B Surface Ag NON-REACTI NON-REACTIVE   Hep B IgM NON-REACTI NON-REACTIVE   Hep C Ab NON-REACTI NON-REACTIVE    HIV No results found for: "HIV" Immunoglobulins    Latest Ref Rng & Units 05/29/2021    8:18 AM  Immunoglobulin Electrophoresis  IgG 586 - 1,602 mg/dL 213   IgM 26 - 086 mg/dL 38    SPEP    Latest Ref Rng & Units 10/17/2023   10:49 AM  Serum Protein Electrophoresis  Total Protein 6.1 - 8.1 g/dL 6.5    V7QI No results found for: "G6PDH" TPMT No results found for: "TPMT"   Chest x-ray (11/30/2013):  -  No acute cardiopulmonary disease  Assessment/Plan:  Reviewed importance of holding Orencia with signs/symptoms of an infections, if antibiotics are prescribed to treat an active infection, and with invasive procedures  Demonstrated proper injection technique with Orencia demo device  Patient able to demonstrate proper injection technique using the teach back method.  Patient self injected in the {injsitedsg:28167} with:  Sample Medication: Orencia 125 mg/ NDC: *** Lot: *** Expiration: ***  Patient tolerated well.  Observed for 30 mins in office for adverse reaction. {injectionreaction:30756}  Patient is to return in 1 month for labs and 6-8 weeks for follow-up appointment.  Standing orders for CBC/CMP.  TB gold will be monitored yearly.   Orencia approved through [facility]. Rx sent to: [facility]: [phone] .  Patient provided with  pharmacy phone number and advised to call later this week to schedule shipment to home.  Patient will continue Orencia subcut every 7 days in combination with Leflunomide .  All questions encouraged and answered.  Instructed patient to call with any further questions or concerns.  Geraldene Kleine, PharmD, MPH, BCPS, CPP Clinical Pharmacist (Rheumatology and Pulmonology)  10/30/2023 9:10 PM

## 2023-10-30 NOTE — Patient Instructions (Incomplete)
 Your next ORENCIA SQ dose is due on 11/07/2023, 11/14/2023, and every 7 days thereafter  CONTINUE Arava  20 mg daily  HOLD ORENCIA SQ if you have signs or symptoms of an infection. You can resume once you feel better or back to your baseline. HOLD ORENCIA SQ if you start antibiotics to treat an infection. HOLD ORENCIA SQ around the time of surgery/procedures. Your surgeon will be able to provide recommendations on when to hold BEFORE and when you are cleared to RESUME.  Pharmacy information: Your prescription will be shipped from [facility]. Their phone number is [phone] They will call to schedule shipment and confirm address. They will mail your medication to your home.  Cost information: Your copay should be affordable. If you call the pharmacy and it is not affordable, please double-check that they are billing through your grant information as secondary coverage. ID: [id number] Group ID: [id number] RxBin: [id number] PCN: [id number]      Labs are due in 1 month then every 3 months. Lab hours are from Monday to Thursday 8am-12:30pm and 1pm-5pm and Friday 8am-12pm. You do not need an appointment if you come for labs during these times. If you'd like to go to a [facility] or [facility] closer to home, please call our clinic 48 hours prior to lab date so we can release orders in a timely manner.  Stay up to date on all routine vaccines: influenza, pneumonia, COVID19, Shingles  How to manage an injection site reaction: Remember the 5 C's: COUNTER - leave on the counter at least 30 minutes but up to overnight to bring medication to room temperature. This may help prevent stinging COLD - place something cold (like an ice gel pack or cold water bottle) on the injection site just before cleansing with alcohol. This may help reduce pain CLARITIN - use Claritin (generic name is loratadine) for the first two weeks of treatment or the day of, the day before, and the day after injecting. This  will help to minimize injection site reactions CORTISONE CREAM - apply if injection site is irritated and itching CALL ME - if injection site reaction is bigger than the size of your fist, looks infected, blisters, or if you develop hives

## 2023-10-31 ENCOUNTER — Other Ambulatory Visit: Payer: Self-pay | Admitting: Pharmacist

## 2023-10-31 ENCOUNTER — Ambulatory Visit: Attending: Rheumatology | Admitting: Pharmacist

## 2023-10-31 ENCOUNTER — Other Ambulatory Visit: Payer: Self-pay

## 2023-10-31 ENCOUNTER — Other Ambulatory Visit (HOSPITAL_COMMUNITY): Payer: Self-pay

## 2023-10-31 DIAGNOSIS — M0609 Rheumatoid arthritis without rheumatoid factor, multiple sites: Secondary | ICD-10-CM | POA: Diagnosis not present

## 2023-10-31 DIAGNOSIS — Z79899 Other long term (current) drug therapy: Secondary | ICD-10-CM

## 2023-10-31 MED ORDER — ORENCIA CLICKJECT 125 MG/ML ~~LOC~~ SOAJ
125.0000 mg | SUBCUTANEOUS | 2 refills | Status: DC
  Filled 2023-10-31: qty 1, fill #0

## 2023-10-31 MED ORDER — ORENCIA CLICKJECT 125 MG/ML ~~LOC~~ SOAJ
125.0000 mg | SUBCUTANEOUS | 2 refills | Status: DC
  Filled 2023-10-31 (×2): qty 4, 28d supply, fill #0
  Filled 2023-11-21: qty 4, 28d supply, fill #1
  Filled 2023-12-26: qty 4, 28d supply, fill #2

## 2023-10-31 NOTE — Progress Notes (Signed)
 Specialty Pharmacy Initial Fill Coordination Note  Jean [name] is a 78 y.o. female contacted today regarding initial fill of specialty medication(s) Abatacept (Orencia ClickJect)   Patient requested Delivery   Delivery date: 11/03/23   Verified address: [address]   [place] [address]   Medication will be filled on 11/02/23.   Patient is enrolled into grant and is aware of $0 copayment.

## 2023-10-31 NOTE — Progress Notes (Signed)
 Pharmacy Note  Subjective: Patient presents today to [facility] for follow up office visit.   Patient seen by the pharmacist for counseling on subcutaneous Orencia rheumatoid arthritis.   Prior therapy includes: Actemra   Objective: CBC    Component Value Date/Time   WBC 6.0 10/17/2023 1049   RBC 3.53 (L) 10/17/2023 1049   HGB 11.1 (L) 10/17/2023 1049   HGB 10.6 (L) 10/10/2019 1402   HGB 11.5 (L) 07/31/2014 0923   HCT 34.5 (L) 10/17/2023 1049   HCT 36.6 07/31/2014 0923   PLT 414 (H) 10/17/2023 1049   PLT 268 10/10/2019 1402   PLT 478 (H) 07/31/2014 0923   MCV 97.7 10/17/2023 1049   MCV 96.4 07/31/2014 0923   MCH 31.4 10/17/2023 1049   MCHC 32.2 10/17/2023 1049   RDW 12.2 10/17/2023 1049   RDW 17.2 (H) 07/31/2014 0923   LYMPHSABS 910 01/21/2023 1126   LYMPHSABS 1.3 07/31/2014 0923   MONOABS 0.4 05/29/2021 0818   MONOABS 0.6 07/31/2014 0923   EOSABS 72 10/17/2023 1049   EOSABS 0.0 07/31/2014 0923   BASOSABS 42 10/17/2023 1049   BASOSABS 0.1 07/31/2014 0923    CMP     Component Value Date/Time   NA 141 10/17/2023 1049   NA 141 03/05/2014 1050   K 4.9 10/17/2023 1049   K 3.9 03/05/2014 1050   CL 107 10/17/2023 1049   CO2 26 10/17/2023 1049   CO2 24 03/05/2014 1050   GLUCOSE 86 10/17/2023 1049   GLUCOSE 99 03/05/2014 1050   BUN 9 10/17/2023 1049   BUN 10.6 03/05/2014 1050   CREATININE 0.83 10/17/2023 1049   CREATININE 0.8 03/05/2014 1050   CALCIUM 9.6 10/17/2023 1049   CALCIUM 8.8 03/05/2014 1050   PROT 6.5 10/17/2023 1049   PROT 6.5 03/05/2014 1050   ALBUMIN 3.5 05/29/2021 0818   ALBUMIN 3.2 (L) 03/05/2014 1050   AST 14 10/17/2023 1049   AST 13 (L) 10/10/2019 1402   AST 12 03/05/2014 1050   ALT 10 10/17/2023 1049   ALT 10 10/10/2019 1402   ALT 28 03/05/2014 1050   ALKPHOS 100 05/29/2021 0818   ALKPHOS 85 03/05/2014 1050   BILITOT 0.3 10/17/2023 1049   BILITOT <0.2 (L) 10/10/2019 1402   BILITOT 0.20 03/05/2014 1050   GFRNONAA >60  05/29/2021 0818   GFRNONAA 87 09/03/2020 1045   GFRAA 100 09/03/2020 1045    Baseline Immunosuppressant Therapy Labs TB GOLD    Latest Ref Rng & Units 06/23/2023    9:38 AM  Quantiferon TB Gold  Quantiferon TB Gold Plus NEGATIVE NEGATIVE    Hepatitis Panel    Latest Ref Rng & Units 08/06/2020   10:59 AM  Hepatitis  Hep B Surface Ag NON-REACTI NON-REACTIVE   Hep B IgM NON-REACTI NON-REACTIVE   Hep C Ab NON-REACTI NON-REACTIVE    HIV No results found for: "HIV" Immunoglobulins    Latest Ref Rng & Units 05/29/2021    8:18 AM  Immunoglobulin Electrophoresis  IgG 586 - 1,602 mg/dL 147   IgM 26 - 829 mg/dL 38    SPEP    Latest Ref Rng & Units 10/17/2023   10:49 AM  Serum Protein Electrophoresis  Total Protein 6.1 - 8.1 g/dL 6.5    F6OZ No results found for: "G6PDH" TPMT No results found for: "TPMT"   Chest x-ray (11/30/2013):  -No acute cardiopulmonary disease  Does patient have a diagnosis of COPD? No  Does patient have history of diverticulitis?  No  Assessment/Plan:  Counseled patient that Jean [name] is a selective T-cell costimulation blocker.  Counseled patient on purpose, proper use, and adverse effects of Orencia. The most common adverse effects are increased risk of infections, headache, and injection site reactions.  There is the possibility of an increased risk of malignancy but it is not well understood if this increased risk is due to the medication or the disease state. Reviewed risk of GI perforation which is higher in patients with diverticulitis and diabetes.  Counseled patient that Orencia should be held prior to scheduled surgery.  Counseled patient to avoid live vaccines while on Orencia.  Recommend annual influenza, Pneumovax 23, Prevnar 13, and Shingrix as indicated.   Reviewed the importance of regular labs while on Orencia therapy. Patient will be due for labs 1 month after starting therapy. Standing orders placed. Provided patient with medication  education material and answered all questions.  Patient consented to [facility].  Will upload consent into patient's chart.  Will apply for Orencia through patient's insurance.  Reviewed storage information for Orencia.  Advised initial injection must be administered in office.    Patient dose will be Orencia 125 mg every 7 days.  Prescription pending lab results and/or insurance approval.   Tolu Matthews Franks, PharmD [facility] PGY-1

## 2023-10-31 NOTE — Addendum Note (Signed)
 Addended by: Thais Fill on: 10/31/2023 10:59 AM   Modules accepted: Orders

## 2023-10-31 NOTE — Progress Notes (Addendum)
 Patient switching from Actemra  to Orencia. Completed first dose of Orencia in clinic without issue. Was consented at new start visit as well  Will continue Orencia 125mg  subcut every 7 days in combination with leflunomide  20mg  once daily  Jean [name], PharmD, MPH, BCPS, CPP Clinical Pharmacist (Rheumatology and Pulmonology)

## 2023-10-31 NOTE — Addendum Note (Signed)
 Addended by: Thais Fill on: 10/31/2023 03:22 PM   Modules accepted: Orders

## 2023-11-02 ENCOUNTER — Other Ambulatory Visit: Payer: Self-pay

## 2023-11-11 HISTORY — PX: TOOTH EXTRACTION: SHX859

## 2023-11-15 NOTE — Progress Notes (Unsigned)
 Office Visit Note  Patient: Jean [name]             Date of Birth: 1945/10/05           MRN: [id number]             PCP: Carolyn Cisco, NP Referring: Carolyn Cisco, NP Visit Date: 11/29/2023 Occupation: @GUAROCC @  Subjective:  Medication monitoring  History of Present Illness: Jean [name] is a 78 y.o. female with history of seronegative rheumatoid arthritis.  Patient remains on Arava  20 mg 1 tablet by mouth daily.  Patient was initiated on Orencia  on 10/31/2023.  She is tolerating Orencia  without any side effects or injection site reactions.  She denies any signs or symptoms of a flare.  She has not noticed any increased morning stiffness.  She denies any nocturnal pain.  She denies any difficulty performing ADLs.  She denies any joint swelling at this time.  Patient states that she recently had 2 teeth extracted and was treated with a course of antibiotics.  Patient states that she postponed Orencia  and Reyvow for 1 week during that time but has otherwise not had any gaps in therapy. She denies recurrent infections.     Activities of Daily Living:  Patient reports morning stiffness for 30 minutes.   Patient Denies nocturnal pain.  Difficulty dressing/grooming: Denies Difficulty climbing stairs: Denies Difficulty getting out of chair: Denies Difficulty using hands for taps, buttons, cutlery, and/or writing: Denies  Review of Systems  Constitutional:  Negative for fatigue.  HENT:  Negative for mouth sores and mouth dryness.   Eyes:  Negative for dryness.  Respiratory:  Negative for shortness of breath.   Cardiovascular:  Negative for chest pain and palpitations.  Gastrointestinal:  Negative for blood in stool, constipation and diarrhea.  Endocrine: Negative for increased urination.  Genitourinary:  Negative for involuntary urination.  Musculoskeletal:  Positive for morning stiffness. Negative for joint pain, gait problem, joint pain, joint swelling,  myalgias, muscle weakness, muscle tenderness and myalgias.  Skin:  Negative for color change, rash, hair loss and sensitivity to sunlight.  Allergic/Immunologic: Negative for susceptible to infections.  Neurological:  Negative for dizziness and headaches.  Hematological:  Negative for swollen glands.  Psychiatric/Behavioral:  Negative for depressed mood and sleep disturbance. The patient is not nervous/anxious.     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   Vitamin D  deficiency 01/23/2017   Rheumatoid arthritis, seronegative, multiple sites (HCC) 01/19/2017   High risk medication use 01/19/2017   Primary osteoarthritis of both hands 01/19/2017   Primary osteoarthritis of both knees 01/19/2017   Fatigue 01/19/2017   Thrombocytosis 01/19/2017   Primary osteoarthritis of both feet 01/19/2017   Deficiency anemia 01/19/2017   Screening-pulmonary TB 04/26/2016    Past Medical History:  Diagnosis Date   Arthritis    Cancer [facility])    History of radiation therapy 05/03/19- 06/25/19   Larynx 35 fractions of 2 Gy each to total  70 Gy   Hypertension    Ingrown toenail    Rheumatoid arthritis (HCC)    Vocal cord mass     Family History  Problem Relation Age of Onset   Stroke Mother    Hypertension Daughter    Colon polyps Neg Hx    Colon cancer Neg Hx    Esophageal cancer Neg Hx    Rectal cancer Neg Hx    Stomach cancer Neg Hx    Past  Surgical History:  Procedure Laterality Date   ECTOPIC PREGNANCY SURGERY     EXTERNAL EAR SURGERY Right    removal of cyst or gland   IR GASTROSTOMY TUBE MOD SED  04/26/2019   IR GASTROSTOMY TUBE REMOVAL  11/05/2019   IR IMAGING GUIDED PORT INSERTION  04/26/2019   IR REMOVAL TUN ACCESS W/ PORT W/O FL MOD SED  11/05/2019   MICROLARYNGOSCOPY Left 03/26/2019   Procedure: DIRECT MICROLARYNGOSCOPY WITH BIOPSY OF VOCAL CORD MASS;  Surgeon: Reynold Caves, MD;  Location: [facility];  Service: ENT;  Laterality: Left;   TOOTH EXTRACTION Left 11/11/2023   one on top an bottom   TYMPANOSTOMY TUBE PLACEMENT Left 08/30/2023   Social History   Social History Narrative   Patient was recently widowed in March 2020.   Patient has 1 daughter who lives in [place], [place]    Patient moved to [place]  from [place]  in January 2009.   Patient is a retired [employer].   Immunization History  Administered Date(s) Administered   Fluad Quad(high Dose 65+) 05/20/2020   Influenza, High Dose Seasonal PF 06/09/2017   PFIZER(Purple Top)SARS-COV-2 Vaccination 08/29/2019, 09/19/2019, 07/11/2020     Objective: Vital Signs: BP (!) 156/76 (BP Location: Right Arm, Patient Position: Sitting, Cuff Size: Normal)   Pulse 96   Resp 16   Ht 5\' 3"  (1.6 m)   Wt 127 lb (57.6 kg)   BMI 22.50 kg/m    Physical Exam Vitals and nursing note reviewed.  Constitutional:      Appearance: She is well-developed.  HENT:     Head: Normocephalic and atraumatic.  Eyes:     Conjunctiva/sclera: Conjunctivae normal.  Cardiovascular:     Rate and Rhythm: Normal rate and regular rhythm.     Heart sounds: Normal heart sounds.  Pulmonary:     Effort: Pulmonary effort is normal.     Breath sounds: Normal breath sounds.  Abdominal:     General: Bowel sounds are normal.     Palpations: Abdomen is soft.  Musculoskeletal:     Cervical back: Normal range of motion.  Lymphadenopathy:     Cervical: No cervical  adenopathy.  Skin:    General: Skin is warm and dry.     Capillary Refill: Capillary refill takes less than 2 seconds.  Neurological:     Mental Status: She is alert and oriented to person, place, and time.  Psychiatric:        Behavior: Behavior normal.      Musculoskeletal Exam: C-spine has slightly limited range of motion without rotation.  Shoulder joints have good ROM with no discomfort.  Elbow joints, wrist joints, MCPs, PIPs, and DIPs good ROM with no synovitis.  Complete fist formation. PIP and DIP thickening.  Hip joints have good ROM with no groin pain.  Knee joints have good ROM with no warmth or effusion.  Ankle joints have good ROM with no tenderness or joint swelling.   CDAI Exam: CDAI Score: -- Patient Global: --; Provider  Global: -- Swollen: --; Tender: -- Joint Exam 11/29/2023   No joint exam has been documented for this visit   There is currently no information documented on the homunculus. Go to the Rheumatology activity and complete the homunculus joint exam.  Investigation: No additional findings.  Imaging: No results found.  Recent Labs: Lab Results  Component Value Date   WBC 6.6 11/25/2023   HGB 10.6 (L) 11/25/2023   PLT 447 (H) 11/25/2023   NA 142 11/25/2023   K 5.2 11/25/2023   CL 110 11/25/2023   CO2 24 11/25/2023   GLUCOSE 72 11/25/2023   BUN 12 11/25/2023   CREATININE 0.77 11/25/2023   BILITOT 0.3 11/25/2023   ALKPHOS 100 05/29/2021   AST 27 11/25/2023   ALT 23 11/25/2023   PROT 6.7 11/25/2023   ALBUMIN 3.5 05/29/2021   CALCIUM 9.7 11/25/2023   GFRAA 100 09/03/2020   QFTBGOLDPLUS NEGATIVE 06/23/2023    Speciality Comments: PPD- 07/08/17 Negative, Humira  started August 06, 2020-inadequate response (last dose 12/24/20), Enbrel  started 01/15/21 (stopped 06/17/21), Actemra  started 06/24/21,MTX-high Cr 11/21  Procedures:  No procedures performed Allergies: Misc. sulfonamide containing compounds and Sulfa antibiotics    Assessment / Plan:      Visit Diagnoses: Rheumatoid arthritis, seronegative, multiple sites [facility]): She has no joint tenderness or synovitis on examination today.  She has not had any signs or symptoms of a rheumatoid arthritis flare.  She has clinically been doing well on Orencia  125 mg subcutaneous injections once weekly and Arava  20 mg 1 tablet by mouth daily.  She is tolerating combination therapy without any side effects.  Orencia  was initiated on 10/31/2023.  Her morning stiffness continues to last for about 30 minutes daily.  She has not had any nocturnal pain or difficulty performing ADLs.  Her symptoms remain well-controlled on the current treatment regimen.  No medication changes will be made at this time.  She was advised to notify us  if she develops signs or symptoms of a flare.  She will follow-up in the office in 5 months or sooner if needed.  High risk medication use -Orencia  125 mg subcutaneous injections once weekly and Arava  20 mg 1 tablet by mouth daily. Orencia  was initiated on 10/31/2023. CBC and CMP updated on 11/25/23.  Her next lab work will be due in September and every 3 months to monitor for drug toxicity. TB gold negative on 06/23/23. No recent or recurrent infections.  Discussed the importance of holding orencia  and arava  if she develops signs or symptoms of an infection and to resume once the infection has completely cleared.   Primary osteoarthritis of both hands: PIP and DIP thickening.  No synovitis on exam.  Complete fist formation bilaterally.  Primary osteoarthritis of both knees: She has good range of motion of both knee joints on examination today.  No warmth or effusion noted.  She has not had any increased difficulty rising from a seated position or climbing steps.    Primary osteoarthritis of both feet: She is not experiencing any discomfort in her feet at this time.  She has good range of motion of both ankle joints with no tenderness or joint swelling.  Spondylosis without myelopathy  or radiculopathy, cervical region: She experiences some stiffness with lateral rotation and crepitus at times.  No symptoms of radiculopathy.  No discomfort currently.  Age-related osteoporosis without current pathological fracture - DEXA 04/05/2022:RFN BMD 0.594, T-score -2.3. She is taking Fosamax  70 mg 1 tablet weekly and vitamin D  2000 units  daily. No recent falls or fractures. Due to update DEXA in October 2025.  History of vitamin D  deficiency: She is taking vitamin D  2000 units daily.  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 Marton Sleeper and ENT. S/P CTX and RTX.  Abnormal SPEP  History of anemia: Patient plans on starting to take an iron  supplement daily.  Orders: No orders of the defined types were placed in this encounter.  No orders of the defined types were placed in this encounter.     Romayne Clubs, PA-C  Note - This record has been created using Dragon software.  Chart creation errors have been sought, but may not always  have been located. Such creation errors do not reflect on  the standard of medical care.

## 2023-11-16 ENCOUNTER — Other Ambulatory Visit: Payer: Self-pay

## 2023-11-21 ENCOUNTER — Other Ambulatory Visit: Payer: Self-pay | Admitting: Pharmacy Technician

## 2023-11-21 ENCOUNTER — Other Ambulatory Visit: Payer: Self-pay

## 2023-11-21 ENCOUNTER — Other Ambulatory Visit (HOSPITAL_COMMUNITY): Payer: Self-pay

## 2023-11-21 NOTE — Progress Notes (Signed)
 Specialty Pharmacy Refill Coordination Note  Jean [name] is a 78 y.o. female contacted today regarding refills of specialty medication(s) Abatacept  (Orencia  ClickJect)   Patient requested Delivery   Delivery date: 12/01/23   Verified address: [address] [place], [place] [address]   Medication will be filled on 11/30/23.

## 2023-11-25 ENCOUNTER — Other Ambulatory Visit: Payer: Self-pay

## 2023-11-25 DIAGNOSIS — Z79899 Other long term (current) drug therapy: Secondary | ICD-10-CM

## 2023-11-26 LAB — CBC WITH DIFFERENTIAL/PLATELET
Absolute Lymphocytes: 1069 {cells}/uL (ref 850–3900)
Absolute Monocytes: 581 {cells}/uL (ref 200–950)
Basophils Absolute: 53 {cells}/uL (ref 0–200)
Basophils Relative: 0.8 %
Eosinophils Absolute: 79 {cells}/uL (ref 15–500)
Eosinophils Relative: 1.2 %
HCT: 35 % (ref 35.0–45.0)
Hemoglobin: 10.6 g/dL — ABNORMAL LOW (ref 11.7–15.5)
MCH: 29.9 pg (ref 27.0–33.0)
MCHC: 30.3 g/dL — ABNORMAL LOW (ref 32.0–36.0)
MCV: 98.9 fL (ref 80.0–100.0)
MPV: 9.4 fL (ref 7.5–12.5)
Monocytes Relative: 8.8 %
Neutro Abs: 4818 {cells}/uL (ref 1500–7800)
Neutrophils Relative %: 73 %
Platelets: 447 10*3/uL — ABNORMAL HIGH (ref 140–400)
RBC: 3.54 10*6/uL — ABNORMAL LOW (ref 3.80–5.10)
RDW: 13.1 % (ref 11.0–15.0)
Total Lymphocyte: 16.2 %
WBC: 6.6 10*3/uL (ref 3.8–10.8)

## 2023-11-26 LAB — COMPREHENSIVE METABOLIC PANEL WITH GFR
AG Ratio: 1.8 (calc) (ref 1.0–2.5)
ALT: 23 U/L (ref 6–29)
AST: 27 U/L (ref 10–35)
Albumin: 4.3 g/dL (ref 3.6–5.1)
Alkaline phosphatase (APISO): 108 U/L (ref 37–153)
BUN: 12 mg/dL (ref 7–25)
CO2: 24 mmol/L (ref 20–32)
Calcium: 9.7 mg/dL (ref 8.6–10.4)
Chloride: 110 mmol/L (ref 98–110)
Creat: 0.77 mg/dL (ref 0.60–1.00)
Globulin: 2.4 g/dL (ref 1.9–3.7)
Glucose, Bld: 72 mg/dL (ref 65–99)
Potassium: 5.2 mmol/L (ref 3.5–5.3)
Sodium: 142 mmol/L (ref 135–146)
Total Bilirubin: 0.3 mg/dL (ref 0.2–1.2)
Total Protein: 6.7 g/dL (ref 6.1–8.1)
eGFR: 80 mL/min/{1.73_m2} (ref >=60)

## 2023-11-27 ENCOUNTER — Ambulatory Visit: Payer: Self-pay | Admitting: Physician Assistant

## 2023-11-27 NOTE — Progress Notes (Signed)
 CMP WNL RBC count and hemoglobin remain low. MCHC is low-recommend multivitamin with iron   Platelet count is elevated-447K.

## 2023-11-29 ENCOUNTER — Telehealth: Payer: Self-pay | Admitting: Radiation Oncology

## 2023-11-29 ENCOUNTER — Ambulatory Visit: Payer: Medicare Other | Attending: Physician Assistant | Admitting: Physician Assistant

## 2023-11-29 ENCOUNTER — Encounter: Payer: Self-pay | Admitting: Physician Assistant

## 2023-11-29 VITALS — BP 156/76 | HR 96 | Resp 16 | Ht 63.0 in | Wt 127.0 lb

## 2023-11-29 DIAGNOSIS — C320 Malignant neoplasm of glottis: Secondary | ICD-10-CM

## 2023-11-29 DIAGNOSIS — R778 Other specified abnormalities of plasma proteins: Secondary | ICD-10-CM

## 2023-11-29 DIAGNOSIS — Z8639 Personal history of other endocrine, nutritional and metabolic disease: Secondary | ICD-10-CM

## 2023-11-29 DIAGNOSIS — Z79899 Other long term (current) drug therapy: Secondary | ICD-10-CM | POA: Diagnosis not present

## 2023-11-29 DIAGNOSIS — M47812 Spondylosis without myelopathy or radiculopathy, cervical region: Secondary | ICD-10-CM

## 2023-11-29 DIAGNOSIS — M19041 Primary osteoarthritis, right hand: Secondary | ICD-10-CM | POA: Diagnosis not present

## 2023-11-29 DIAGNOSIS — M810 Age-related osteoporosis without current pathological fracture: Secondary | ICD-10-CM

## 2023-11-29 DIAGNOSIS — M19042 Primary osteoarthritis, left hand: Secondary | ICD-10-CM

## 2023-11-29 DIAGNOSIS — Z862 Personal history of diseases of the blood and blood-forming organs and certain disorders involving the immune mechanism: Secondary | ICD-10-CM

## 2023-11-29 DIAGNOSIS — M19072 Primary osteoarthritis, left ankle and foot: Secondary | ICD-10-CM

## 2023-11-29 DIAGNOSIS — M0609 Rheumatoid arthritis without rheumatoid factor, multiple sites: Secondary | ICD-10-CM

## 2023-11-29 DIAGNOSIS — M19071 Primary osteoarthritis, right ankle and foot: Secondary | ICD-10-CM

## 2023-11-29 DIAGNOSIS — M170 Bilateral primary osteoarthritis of knee: Secondary | ICD-10-CM

## 2023-11-29 NOTE — Telephone Encounter (Signed)
 Received Medical record request from [facility] cancer study, records sent and request forwarded to [employer].

## 2023-11-29 NOTE — Patient Instructions (Signed)

## 2023-12-22 ENCOUNTER — Other Ambulatory Visit: Payer: Self-pay

## 2023-12-26 ENCOUNTER — Other Ambulatory Visit: Payer: Self-pay

## 2023-12-26 NOTE — Progress Notes (Signed)
 Specialty Pharmacy Refill Coordination Note  Jean [name] is a 78 y.o. female contacted today regarding refills of specialty medication(s) Abatacept  (Orencia  ClickJect)   Patient requested Delivery   Delivery date: 12/28/23   Verified address: [address] [place], [place] [address]   Medication will be filled on 12/29/23.

## 2024-01-11 ENCOUNTER — Other Ambulatory Visit: Payer: Self-pay | Admitting: Physician Assistant

## 2024-01-11 NOTE — Addendum Note (Signed)
 Addended by: DAYNE SHERRY RAMAN on: 01/11/2024 09:56 AM   Modules accepted: Level of Service

## 2024-01-11 NOTE — Telephone Encounter (Signed)
 Last Fill: 10/13/2023  Labs: 11/25/2023 CMP WNL  RBC count and hemoglobin remain low. MCHC is low-recommend multivitamin with iron   Platelet count is elevated-447K.     Next Visit: 04/30/2024  Last Visit: 11/29/2023  DX: Rheumatoid arthritis, seronegative, multiple sites   Current Dose per office note 11/29/2023: Arava  20 mg 1 tablet by mouth daily.   Okay to refill Arava  ?

## 2024-01-16 ENCOUNTER — Other Ambulatory Visit: Payer: Self-pay

## 2024-01-16 ENCOUNTER — Other Ambulatory Visit: Payer: Self-pay | Admitting: Rheumatology

## 2024-01-16 DIAGNOSIS — Z79899 Other long term (current) drug therapy: Secondary | ICD-10-CM

## 2024-01-16 DIAGNOSIS — M0609 Rheumatoid arthritis without rheumatoid factor, multiple sites: Secondary | ICD-10-CM

## 2024-01-16 MED ORDER — ORENCIA CLICKJECT 125 MG/ML ~~LOC~~ SOAJ
125.0000 mg | SUBCUTANEOUS | 2 refills | Status: DC
Start: 2024-01-16 — End: 2024-05-09
  Filled 2024-01-16 – 2024-02-23 (×3): qty 4, 28d supply, fill #0
  Filled 2024-03-23: qty 4, 28d supply, fill #1
  Filled 2024-04-16 – 2024-04-18 (×2): qty 4, 28d supply, fill #2

## 2024-01-16 NOTE — Telephone Encounter (Signed)
 Last Fill: 10/31/2023  Labs: 11/25/2023 CMP WNL  RBC count and hemoglobin remain low. MCHC is low-recommend multivitamin with iron   Platelet count is elevated-447K.   TB Gold: 06/23/2023 Negative   Next Visit: 04/30/2024  Last Visit: 11/29/2023  IK:Myzlfjunpi arthritis, seronegative, multiple sites [facility])   Current Dose per office note 11/29/2023: Orencia  125 mg subcutaneous injections once weekly   Okay to refill Orencia ?

## 2024-02-08 ENCOUNTER — Other Ambulatory Visit: Payer: Self-pay

## 2024-02-23 ENCOUNTER — Other Ambulatory Visit: Payer: Self-pay

## 2024-02-23 NOTE — Progress Notes (Signed)
 Specialty Pharmacy Refill Coordination Note  Jean [name] is a 78 y.o. female contacted today regarding refills of specialty medication(s) Abatacept  (Orencia  ClickJect)   Patient requested Delivery   Delivery date: 03/01/24   Verified address: [address] [place], [place] [address]   Medication will be filled on 02/29/24.

## 2024-02-29 ENCOUNTER — Other Ambulatory Visit: Payer: Self-pay

## 2024-03-06 ENCOUNTER — Telehealth: Payer: Self-pay

## 2024-03-06 NOTE — Telephone Encounter (Signed)
 Per previous encounter, current PA expires on 03/07/24.  Submitted a Prior Authorization request to [facility] for [employer]  SQ via CoverMyMeds. Authorization has been APPROVED from 03/06/2024 to 06/20/2024. Approval letter sent to scan center.   CMM KEY: [id number]  Authorization#: [id number]

## 2024-03-21 ENCOUNTER — Other Ambulatory Visit: Payer: Self-pay

## 2024-03-23 ENCOUNTER — Other Ambulatory Visit: Payer: Self-pay

## 2024-03-23 NOTE — Progress Notes (Signed)
 Specialty Pharmacy Refill Coordination Note  Jean [name] is a 78 y.o. female contacted today regarding refills of specialty medication(s) Abatacept  (Orencia  ClickJect)   Patient requested Delivery   Delivery date: 03/27/24   Verified address: [address] [place], [place] [address]   Medication will be filled on 03/26/24.

## 2024-04-16 ENCOUNTER — Other Ambulatory Visit (HOSPITAL_COMMUNITY): Payer: Self-pay

## 2024-04-16 NOTE — Progress Notes (Deleted)
 Office Visit Note  Patient: Jean [name]             Date of Birth: 12/30/1945           MRN: [id number]             PCP: Delores Rojelio Caldron, NP Referring: Delores Rojelio Caldron, NP Visit Date: 04/30/2024 Occupation: Data Unavailable  Subjective:  No chief complaint on file.   History of Present Illness: Jean [name] is a 78 y.o. female ***     Activities of Daily Living:  Patient reports morning stiffness for *** {minute/hour:19697}.   Patient {ACTIONS;DENIES/REPORTS:21021675::Denies} nocturnal pain.  Difficulty dressing/grooming: {ACTIONS;DENIES/REPORTS:21021675::Denies} Difficulty climbing stairs: {ACTIONS;DENIES/REPORTS:21021675::Denies} Difficulty getting out of chair: {ACTIONS;DENIES/REPORTS:21021675::Denies} Difficulty using hands for taps, buttons, cutlery, and/or writing: {ACTIONS;DENIES/REPORTS:21021675::Denies}  No Rheumatology ROS completed.   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   Vitamin D  deficiency 01/23/2017   Rheumatoid arthritis, seronegative, multiple sites (HCC) 01/19/2017   High risk medication use 01/19/2017   Primary osteoarthritis of both hands 01/19/2017   Primary osteoarthritis of both knees 01/19/2017   Fatigue 01/19/2017   Thrombocytosis 01/19/2017   Primary osteoarthritis of both feet 01/19/2017   Deficiency anemia 01/19/2017   Screening-pulmonary TB 04/26/2016    Past  Medical History:  Diagnosis Date   Arthritis    Cancer (HCC)    History of radiation therapy 05/03/19- 06/25/19   Larynx 35 fractions of 2 Gy each to total 70 Gy   Hypertension    Ingrown toenail    Rheumatoid arthritis (HCC)    Vocal cord mass     Family History  Problem Relation Age of Onset   Stroke Mother    Hypertension Daughter    Colon polyps Neg Hx    Colon cancer Neg Hx    Esophageal cancer Neg Hx    Rectal cancer Neg Hx    Stomach cancer Neg Hx    Past Surgical History:  Procedure Laterality Date   ECTOPIC PREGNANCY SURGERY     EXTERNAL EAR SURGERY Right    removal of cyst or gland   IR GASTROSTOMY TUBE MOD SED  04/26/2019   IR GASTROSTOMY TUBE REMOVAL  11/05/2019   IR IMAGING GUIDED PORT INSERTION  04/26/2019   IR REMOVAL TUN ACCESS W/ PORT W/O FL MOD SED  11/05/2019   MICROLARYNGOSCOPY Left 03/26/2019   Procedure: DIRECT MICROLARYNGOSCOPY WITH BIOPSY OF VOCAL CORD MASS;  Surgeon: Karis Clunes, MD;  Location: [facility];  Service: ENT;  Laterality: Left;   TOOTH EXTRACTION Left 11/11/2023   one on top an bottom   TYMPANOSTOMY TUBE PLACEMENT Left 08/30/2023   Social History   Tobacco Use   Smoking status: Former    Current packs/day: 0.00    Average packs/day: 1 pack/day for 30.0 years (30.0 ttl pk-yrs)    Types: Cigarettes    Start date: 11/07/1973    Quit date: 11/08/2003    Years since quitting: 20.4    Passive  exposure: Never   Smokeless tobacco: Never  Vaping Use   Vaping status: Never Used  Substance Use Topics   Alcohol use: Not Currently   Drug use: No   Social History   Social History Narrative   Patient was recently widowed in March 2020.   Patient has 1 daughter who lives in [place], [place]    Patient moved to [place]  from [place]  in January 2009.   Patient is a retired [employer].     Immunization History  Administered Date(s) Administered   Fluad Quad(high Dose 65+) 05/20/2020   INFLUENZA, HIGH DOSE SEASONAL PF  06/09/2017   PFIZER(Purple Top)SARS-COV-2 Vaccination 08/29/2019, 09/19/2019, 07/11/2020     Objective: Vital Signs: There were no vitals taken for this visit.   Physical Exam   Musculoskeletal Exam: ***  CDAI Exam: CDAI Score: -- Patient Global: --; Provider Global: -- Swollen: --; Tender: -- Joint Exam 04/30/2024   No joint exam has been documented for this visit   There is currently no information documented on the homunculus. Go to the Rheumatology activity and complete the homunculus joint exam.  Investigation: No additional findings.  Imaging: No results found.  Recent Labs: Lab Results  Component Value Date   WBC 6.6 11/25/2023   HGB 10.6 (L) 11/25/2023   PLT 447 (H) 11/25/2023   NA 142 11/25/2023   K 5.2 11/25/2023   CL 110 11/25/2023   CO2 24 11/25/2023   GLUCOSE 72 11/25/2023   BUN 12 11/25/2023   CREATININE 0.77 11/25/2023   BILITOT 0.3 11/25/2023   ALKPHOS 100 05/29/2021   AST 27 11/25/2023   ALT 23 11/25/2023   PROT 6.7 11/25/2023   ALBUMIN 3.5 05/29/2021   CALCIUM 9.7 11/25/2023   GFRAA 100 09/03/2020   QFTBGOLDPLUS NEGATIVE 06/23/2023    Speciality Comments: PPD- 07/08/17 Negative, Humira  started August 06, 2020-inadequate response (last dose 12/24/20), Enbrel  started 01/15/21 (stopped 06/17/21), Actemra  started 06/24/21,MTX-high Cr 11/21  Procedures:  No procedures performed Allergies: Misc. 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  No orders of the defined types were placed in this encounter.   Face-to-face time spent with patient was *** minutes. Greater than 50% of time was spent in counseling and coordination of care.  Follow-Up Instructions: No follow-ups on file.   Waddell CHRISTELLA Craze, PA-C  Note - This record has been created using Dragon software.  Chart creation errors have been sought, but may not always  have been located. Such creation errors do not reflect on  the standard of medical care.

## 2024-04-18 ENCOUNTER — Other Ambulatory Visit: Payer: Self-pay

## 2024-04-18 NOTE — Progress Notes (Signed)
 Specialty Pharmacy Refill Coordination Note  Jean [name] is a 78 y.o. female contacted today regarding refills of specialty medication(s) Abatacept  (Orencia  ClickJect)   Patient requested Delivery   Delivery date: 04/27/24   Verified address: [address] [place], [place] [address]   Medication will be filled on: 04/26/24

## 2024-04-24 ENCOUNTER — Other Ambulatory Visit: Payer: Self-pay | Admitting: Gastroenterology

## 2024-04-25 NOTE — Progress Notes (Unsigned)
 Office Visit Note  Patient: Jean [name]             Date of Birth: 28-Feb-1946           MRN: [id number]             PCP: Delores Rojelio Caldron, NP Referring: Delores Rojelio Caldron, NP Visit Date: 05/09/2024 Occupation: Data Unavailable  Subjective:  Medication monitoring   History of Present Illness: Jean [name] is a 78 y.o. female with history of rheumatoid arthritis. Patient remains on Orencia  125 mg subcutaneous injections once weekly and Arava  20 mg 1 tablet by mouth daily.  She is to rating combination therapy without any side effects and has not had any gaps in therapy.  She denies any signs or symptoms of a rheumatoid arthritis flare.  She denies any joint swelling.  She has not had any nocturnal pain or difficulty performing ADLs.  She has been stretching on a regular basis. Patient states she has been taking Fosamax  about 2 times per month due to having a difficult time remembering to take the weekly medication. Patient states that she has been taking an iron  supplement with vitamin C.  Patient came this morning to have updated blood work obtained. She denies any recent or recurrent infections.  She denies any new medical conditions.   Activities of Daily Living:  Patient reports morning stiffness for about 1 hour.   Patient Denies nocturnal pain.  Difficulty dressing/grooming: Denies Difficulty climbing stairs: Denies Difficulty getting out of chair: Denies Difficulty using hands for taps, buttons, cutlery, and/or writing: Denies  Review of Systems  Constitutional:  Positive for fatigue.  HENT:  Negative for mouth sores and mouth dryness.   Eyes:  Negative for dryness.  Respiratory:  Negative for shortness of breath.   Cardiovascular:  Negative for chest pain and palpitations.  Gastrointestinal:  Negative for blood in stool, constipation and diarrhea.  Endocrine: Negative for increased urination.  Genitourinary:  Negative for involuntary urination.   Musculoskeletal:  Positive for joint pain, joint pain and morning stiffness. Negative for gait problem, joint swelling, myalgias, muscle weakness, muscle tenderness and myalgias.  Skin:  Negative for color change, rash, hair loss and sensitivity to sunlight.  Allergic/Immunologic: Negative for susceptible to infections.  Neurological:  Negative for dizziness and headaches.  Hematological:  Negative for swollen glands.  Psychiatric/Behavioral:  Negative for depressed mood and sleep disturbance. The patient is not nervous/anxious.     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   Vitamin D  deficiency 01/23/2017   Rheumatoid arthritis, seronegative, multiple sites (HCC) 01/19/2017   High risk medication use 01/19/2017   Primary osteoarthritis of both hands 01/19/2017   Primary osteoarthritis of both knees 01/19/2017   Fatigue 01/19/2017   Thrombocytosis 01/19/2017   Primary osteoarthritis of both feet 01/19/2017   Deficiency anemia 01/19/2017   Screening-pulmonary TB 04/26/2016    Past Medical History:  Diagnosis Date   Arthritis    Cancer (HCC)  History of radiation therapy 05/03/19- 06/25/19   Larynx 35 fractions of 2 Gy each to total 70 Gy   Hypertension    Ingrown toenail    Rheumatoid arthritis (HCC)    Vocal cord mass     Family History  Problem Relation Age of Onset   Stroke Mother    Hypertension Daughter    Colon polyps Neg Hx     Colon cancer Neg Hx    Esophageal cancer Neg Hx    Rectal cancer Neg Hx    Stomach cancer Neg Hx    Past Surgical History:  Procedure Laterality Date   ECTOPIC PREGNANCY SURGERY     EXTERNAL EAR SURGERY Right    removal of cyst or gland   IR GASTROSTOMY TUBE MOD SED  04/26/2019   IR GASTROSTOMY TUBE REMOVAL  11/05/2019   IR IMAGING GUIDED PORT INSERTION  04/26/2019   IR REMOVAL TUN ACCESS W/ PORT W/O FL MOD SED  11/05/2019   MICROLARYNGOSCOPY Left 03/26/2019   Procedure: DIRECT MICROLARYNGOSCOPY WITH BIOPSY OF VOCAL CORD MASS;  Surgeon: Karis Clunes, MD;  Location: [facility];  Service: ENT;  Laterality: Left;   TOOTH EXTRACTION Left 11/11/2023   one on top an bottom   TYMPANOSTOMY TUBE PLACEMENT Left 08/30/2023   Social History   Tobacco Use   Smoking status: Former    Current packs/day: 0.00    Average packs/day: 1 pack/day for 30.0 years (30.0 ttl pk-yrs)    Types: Cigarettes    Start date: 11/07/1973    Quit date: 11/08/2003    Years since quitting: 20.5    Passive exposure: Never   Smokeless tobacco: Never  Vaping Use   Vaping status: Never Used  Substance Use Topics   Alcohol use: Not Currently   Drug use: No   Social History   Social History Narrative   Patient was recently widowed in March 2020.   Patient has 1 daughter who lives in [place], [place]    Patient moved to [place]  from [place]  in January 2009.   Patient is a retired [employer].     Immunization History  Administered Date(s) Administered   Fluad Quad(high Dose 65+) 05/20/2020   INFLUENZA, HIGH DOSE SEASONAL PF 06/09/2017   PFIZER(Purple Top)SARS-COV-2 Vaccination 08/29/2019, 09/19/2019, 07/11/2020     Objective: Vital Signs: BP (!) 150/95   Pulse 90   Temp 97.9 F (36.6 C)   Resp 14   Ht 5' 3 (1.6 m)   Wt 122 lb 6.4 oz (55.5 kg)   BMI 21.68 kg/m    Physical Exam Vitals and nursing note reviewed.  Constitutional:      Appearance: She is well-developed.  HENT:      Head: Normocephalic and atraumatic.  Eyes:     Conjunctiva/sclera: Conjunctivae normal.  Cardiovascular:     Rate and Rhythm: Normal rate and regular rhythm.     Heart sounds: Normal heart sounds.  Pulmonary:     Effort: Pulmonary effort is normal.     Breath sounds: Normal breath sounds.  Abdominal:     General: Bowel sounds are normal.     Palpations: Abdomen is soft.  Musculoskeletal:     Cervical back: Normal range of motion.  Lymphadenopathy:     Cervical: No cervical adenopathy.  Skin:    General: Skin is warm and dry.     Capillary Refill: Capillary refill takes less than 2 seconds.  Neurological:     Mental Status: She is alert and  oriented to person, place, and time.  Psychiatric:        Behavior: Behavior normal.      Musculoskeletal Exam: C-spine has slightly limited ROM with lateral rotation.  Thoracic spine and lumbar spine have good range of motion.  No midline spinal tenderness.  No SI joint tenderness.  Shoulder joints, elbow joints, wrist joints, MCPs, PIPs, DIPs have good range of motion with no synovitis.  PIP and DIP thickening. Complete fist formation bilaterally.  Left hip has slightly limited ROM.  Right hip has good ROM with no groin pain.   [name] joints have good range of motion no warmth or effusion.  Ankle joints have good range of motion no tenderness or joint swelling.  No evidence of Achilles tendinitis or plantar fasciitis.   CDAI Exam: CDAI Score: -- Patient Global: --; Provider Global: -- Swollen: --; Tender: -- Joint Exam 05/09/2024   No joint exam has been documented for this visit   There is currently no information documented on the homunculus. Go to the Rheumatology activity and complete the homunculus joint exam.  Investigation: No additional findings.  Imaging: No results found.  Recent Labs: Lab Results  Component Value Date   WBC 6.6 11/25/2023   HGB 10.6 (L) 11/25/2023   PLT 447 (H) 11/25/2023   NA 142 11/25/2023   K  5.2 11/25/2023   CL 110 11/25/2023   CO2 24 11/25/2023   GLUCOSE 72 11/25/2023   BUN 12 11/25/2023   CREATININE 0.77 11/25/2023   BILITOT 0.3 11/25/2023   ALKPHOS 100 05/29/2021   AST 27 11/25/2023   ALT 23 11/25/2023   PROT 6.7 11/25/2023   ALBUMIN 3.5 05/29/2021   CALCIUM 9.7 11/25/2023   GFRAA 100 09/03/2020   QFTBGOLDPLUS NEGATIVE 06/23/2023    Speciality Comments: PPD- 07/08/17 Negative, Humira  started August 06, 2020-inadequate response (last dose 12/24/20), Enbrel  started 01/15/21 (stopped 06/17/21), Actemra  started 06/24/21,MTX-high Cr 11/21  Procedures:  No procedures performed Allergies: Misc. sulfonamide containing compounds and Sulfa antibiotics    Assessment / Plan:     Visit Diagnoses: Rheumatoid arthritis, seronegative, multiple sites [facility]): She has no synovitis on examination today.  She has not had any signs or symptoms of a rheumatoid arthritis flare.  She has clinically been doing well on Orencia  125 mg sq injections once weekly and Arava  20 mg 1 tablet by mouth daily.  She is tolerating combination therapy without any side effects and has not had any gaps in therapy.  No recent or recurrent infections.  No difficulty performing ADLs.  No nocturnal pain.  No medication changes will be made at this time.  She was advised to notify us  if she develops any signs or symptoms of a flare.  She will follow-up in the office in 5 months or sooner if needed.  High risk medication use - Orencia  125 mg subcutaneous injections once weekly and Arava  20 mg 1 tablet by mouth daily.Orencia  was initiated on 10/31/2023. CBC and CMP updated on 11/25/23. Orders for CBC and CMP released today.   TB gold negative on 06/23/23.  Lipid panel updated on 01/21/23.  Lipid panel released today.  No recent or recurrent infections. Discussed the importance of holding orencia  and arava  if she develops signs or symptoms of an infection and to resume once the infection has completely cleared.   Primary  osteoarthritis of both hands: She has PIP and DIP thickening but no synovitis noted.  Primary osteoarthritis of both knees: No warmth or effusion noted.  Primary osteoarthritis of both feet: Both ankle joints have good range of motion with no tenderness or joint swelling.  Spondylosis without myelopathy or radiculopathy, cervical region: C-spine has limited ROM with lateral rotation.  No symptoms of radiculopathy.   Age-related osteoporosis without current pathological fracture - DEXA 04/05/2022:RFN BMD 0.594, T-score -2.3. She is taking Fosamax  70 mg 1 tablet weekly and vitamin D  2000 units daily.  Due to update DEXA in October 2025.  Order for DEXA released today.- Plan: DG BONE DENSITY (DXA)  History of vitamin D  deficiency: She is taking vitamin D  2000 units daily.  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 Lonn and ENT. S/P CTX and RTX.  Abnormal SPEP  History of anemia: She is taking an iron  supplement with vitamin C daily.   Orders: Orders Placed This Encounter  Procedures   DG BONE DENSITY (DXA)   No orders of the defined types were placed in this encounter.    Follow-Up Instructions: Return in about 5 months (around 10/07/2024) for Rheumatoid arthritis, Osteoporosis.   Waddell CHRISTELLA Craze, PA-C  Note - This record has been created using Dragon software.  Chart creation errors have been sought, but may not always  have been located. Such creation errors do not reflect on  the standard of medical care.

## 2024-04-26 ENCOUNTER — Other Ambulatory Visit: Payer: Self-pay

## 2024-04-30 ENCOUNTER — Ambulatory Visit: Admitting: Physician Assistant

## 2024-04-30 DIAGNOSIS — M47812 Spondylosis without myelopathy or radiculopathy, cervical region: Secondary | ICD-10-CM

## 2024-04-30 DIAGNOSIS — Z8639 Personal history of other endocrine, nutritional and metabolic disease: Secondary | ICD-10-CM

## 2024-04-30 DIAGNOSIS — M19042 Primary osteoarthritis, left hand: Secondary | ICD-10-CM

## 2024-04-30 DIAGNOSIS — C320 Malignant neoplasm of glottis: Secondary | ICD-10-CM

## 2024-04-30 DIAGNOSIS — R778 Other specified abnormalities of plasma proteins: Secondary | ICD-10-CM

## 2024-04-30 DIAGNOSIS — M810 Age-related osteoporosis without current pathological fracture: Secondary | ICD-10-CM

## 2024-04-30 DIAGNOSIS — Z862 Personal history of diseases of the blood and blood-forming organs and certain disorders involving the immune mechanism: Secondary | ICD-10-CM

## 2024-04-30 DIAGNOSIS — Z79899 Other long term (current) drug therapy: Secondary | ICD-10-CM

## 2024-04-30 DIAGNOSIS — M19072 Primary osteoarthritis, left ankle and foot: Secondary | ICD-10-CM

## 2024-04-30 DIAGNOSIS — M0609 Rheumatoid arthritis without rheumatoid factor, multiple sites: Secondary | ICD-10-CM

## 2024-04-30 DIAGNOSIS — M170 Bilateral primary osteoarthritis of knee: Secondary | ICD-10-CM

## 2024-05-09 ENCOUNTER — Other Ambulatory Visit: Payer: Self-pay

## 2024-05-09 ENCOUNTER — Ambulatory Visit: Attending: Physician Assistant | Admitting: Physician Assistant

## 2024-05-09 ENCOUNTER — Other Ambulatory Visit: Payer: Self-pay | Admitting: *Deleted

## 2024-05-09 ENCOUNTER — Encounter: Payer: Self-pay | Admitting: Physician Assistant

## 2024-05-09 VITALS — BP 150/95 | HR 90 | Temp 97.9°F | Resp 14 | Ht 63.0 in | Wt 122.4 lb

## 2024-05-09 DIAGNOSIS — Z13220 Encounter for screening for lipoid disorders: Secondary | ICD-10-CM

## 2024-05-09 DIAGNOSIS — Z862 Personal history of diseases of the blood and blood-forming organs and certain disorders involving the immune mechanism: Secondary | ICD-10-CM

## 2024-05-09 DIAGNOSIS — M19041 Primary osteoarthritis, right hand: Secondary | ICD-10-CM | POA: Diagnosis not present

## 2024-05-09 DIAGNOSIS — C320 Malignant neoplasm of glottis: Secondary | ICD-10-CM

## 2024-05-09 DIAGNOSIS — Z79899 Other long term (current) drug therapy: Secondary | ICD-10-CM

## 2024-05-09 DIAGNOSIS — M0609 Rheumatoid arthritis without rheumatoid factor, multiple sites: Secondary | ICD-10-CM

## 2024-05-09 DIAGNOSIS — M170 Bilateral primary osteoarthritis of knee: Secondary | ICD-10-CM

## 2024-05-09 DIAGNOSIS — M810 Age-related osteoporosis without current pathological fracture: Secondary | ICD-10-CM

## 2024-05-09 DIAGNOSIS — Z8639 Personal history of other endocrine, nutritional and metabolic disease: Secondary | ICD-10-CM

## 2024-05-09 DIAGNOSIS — R778 Other specified abnormalities of plasma proteins: Secondary | ICD-10-CM

## 2024-05-09 DIAGNOSIS — M47812 Spondylosis without myelopathy or radiculopathy, cervical region: Secondary | ICD-10-CM

## 2024-05-09 DIAGNOSIS — M19071 Primary osteoarthritis, right ankle and foot: Secondary | ICD-10-CM

## 2024-05-09 DIAGNOSIS — M19072 Primary osteoarthritis, left ankle and foot: Secondary | ICD-10-CM

## 2024-05-09 DIAGNOSIS — M19042 Primary osteoarthritis, left hand: Secondary | ICD-10-CM

## 2024-05-09 MED ORDER — ALENDRONATE SODIUM 70 MG PO TABS: 70.0000 mg | ORAL_TABLET | ORAL | 0 refills | Status: AC

## 2024-05-09 MED ORDER — LEFLUNOMIDE 20 MG PO TABS: ORAL_TABLET | ORAL | 0 refills | Status: AC

## 2024-05-09 MED ORDER — ORENCIA CLICKJECT 125 MG/ML ~~LOC~~ SOAJ
125.0000 mg | SUBCUTANEOUS | 2 refills | Status: AC
  Filled 2024-05-09 – 2024-05-18 (×2): qty 4, 28d supply, fill #0
  Filled 2024-06-18: qty 4, 28d supply, fill #1
  Filled 2024-07-13 – 2024-07-23 (×3): qty 4, 28d supply, fill #2

## 2024-05-09 NOTE — Progress Notes (Unsigned)
 Patient seen in office today, please review and sign.

## 2024-05-09 NOTE — Patient Instructions (Signed)
 Standing Labs We placed an order today for your standing lab work.   Please have your standing labs drawn in February and every 3 months  Please have your labs drawn 2 weeks prior to your appointment so that the provider can discuss your lab results at your appointment, if possible.  Please note that you may see your imaging and lab results in MyChart before we have reviewed them. We will contact you once all results are reviewed. Please allow our office up to 72 hours to thoroughly review all of the results before contacting the office for clarification of your results.  WALK-IN LAB HOURS  Monday through Thursday from 8:00 am - 4:30 pm and Friday from 8:00 am-12:00 pm.  Patients with office visits requiring labs will be seen before walk-in labs.  You may encounter longer than normal wait times. Please allow additional time. Wait times may be shorter on  Monday and Thursday afternoons.  We do not book appointments for walk-in labs. We appreciate your patience and understanding with our staff.   Labs are drawn by [facility]. Please bring your co-pay at the time of your lab draw.  You may receive a bill from [facility] for your lab work.  Please note if you are on Hydroxychloroquine  and and an order has been placed for a Hydroxychloroquine  level,  you will need to have it drawn 4 hours or more after your last dose.  If you wish to have your labs drawn at another location, please call the office 24 hours in advance so we can fax the orders.  The office is located at [address], [address], [place], [place] [address]   If you have any questions regarding directions or hours of operation,  please call [phone].   As a reminder, please drink plenty of water prior to coming for your lab work. Thanks!

## 2024-05-10 ENCOUNTER — Ambulatory Visit: Payer: Self-pay | Admitting: Physician Assistant

## 2024-05-10 LAB — CBC WITH DIFFERENTIAL/PLATELET
Absolute Lymphocytes: 819 {cells}/uL — ABNORMAL LOW (ref 850–3900)
Absolute Monocytes: 428 {cells}/uL (ref 200–950)
Basophils Absolute: 32 {cells}/uL (ref 0–200)
Basophils Relative: 0.7 %
Eosinophils Absolute: 59 {cells}/uL (ref 15–500)
Eosinophils Relative: 1.3 %
HCT: 36.6 % (ref 35.0–45.0)
Hemoglobin: 11.8 g/dL (ref 11.7–15.5)
MCH: 31.7 pg (ref 27.0–33.0)
MCHC: 32.2 g/dL (ref 32.0–36.0)
MCV: 98.4 fL (ref 80.0–100.0)
MPV: 10 fL (ref 7.5–12.5)
Monocytes Relative: 9.5 %
Neutro Abs: 3164 {cells}/uL (ref 1500–7800)
Neutrophils Relative %: 70.3 %
Platelets: 341 Thousand/uL (ref 140–400)
RBC: 3.72 Million/uL — ABNORMAL LOW (ref 3.80–5.10)
RDW: 14.4 % (ref 11.0–15.0)
Total Lymphocyte: 18.2 %
WBC: 4.5 Thousand/uL (ref 3.8–10.8)

## 2024-05-10 LAB — COMPREHENSIVE METABOLIC PANEL WITH GFR
AG Ratio: 1.9 (calc) (ref 1.0–2.5)
ALT: 11 U/L (ref 6–29)
AST: 12 U/L (ref 10–35)
Albumin: 4.2 g/dL (ref 3.6–5.1)
Alkaline phosphatase (APISO): 73 U/L (ref 37–153)
BUN: 10 mg/dL (ref 7–25)
CO2: 24 mmol/L (ref 20–32)
Calcium: 9.6 mg/dL (ref 8.6–10.4)
Chloride: 107 mmol/L (ref 98–110)
Creat: 0.77 mg/dL (ref 0.60–1.00)
Globulin: 2.2 g/dL (ref 1.9–3.7)
Glucose, Bld: 90 mg/dL (ref 65–99)
Potassium: 4.6 mmol/L (ref 3.5–5.3)
Sodium: 140 mmol/L (ref 135–146)
Total Bilirubin: 0.4 mg/dL (ref 0.2–1.2)
Total Protein: 6.4 g/dL (ref 6.1–8.1)
eGFR: 80 mL/min/1.73m2 (ref >=60)

## 2024-05-10 LAB — LIPID PANEL
Cholesterol: 215 mg/dL — ABNORMAL HIGH (ref <200)
HDL: 61 mg/dL (ref >=50)
LDL Cholesterol (Calc): 126 mg/dL — ABNORMAL HIGH
Non-HDL Cholesterol (Calc): 154 mg/dL — ABNORMAL HIGH (ref <130)
Total CHOL/HDL Ratio: 3.5 (calc) (ref <5.0)
Triglycerides: 162 mg/dL — ABNORMAL HIGH (ref <150)

## 2024-05-10 NOTE — Progress Notes (Signed)
 CMP WNL RBC count is low but improved.  Absolute lymphocytes are low. Rest of CBC WNL. Total cholesterol is elevated but improved-215. Triglycerides are elevated but improved.   LDL is elevated but improved.

## 2024-05-18 ENCOUNTER — Other Ambulatory Visit: Payer: Self-pay

## 2024-05-18 ENCOUNTER — Other Ambulatory Visit (HOSPITAL_COMMUNITY): Payer: Self-pay

## 2024-05-22 ENCOUNTER — Other Ambulatory Visit: Payer: Self-pay

## 2024-05-22 NOTE — Progress Notes (Signed)
 Specialty Pharmacy Refill Coordination Note  Jean [name] is a 78 y.o. female contacted today regarding refills of specialty medication(s) Abatacept  (Orencia  ClickJect)   Patient requested Delivery   Delivery date: 05/29/24   Verified address: [address] [place], [place] [address]   Medication will be filled on: 05/28/24

## 2024-05-28 ENCOUNTER — Other Ambulatory Visit: Payer: Self-pay

## 2024-06-07 ENCOUNTER — Telehealth: Payer: Self-pay | Admitting: Pharmacist

## 2024-06-07 NOTE — Telephone Encounter (Signed)
 Received fax from [facility] stating Orencia  will not be covered in  2026  Submitted a Prior Authorization request to [employer] for ORENCIA  SQ via CoverMyMeds. Will update once we receive a response.  Key: [id number]    Sherry Pennant, PharmD, MPH, BCPS, CPP Clinical Pharmacist

## 2024-06-11 NOTE — Telephone Encounter (Signed)
 Received a fax regarding Prior Authorization from [facility] for ORENCIA  SQ. Authorization has been DENIED because: .  Case #: [id number]  Will work on appeal  Sherry Pennant, PharmD, MPH, BCPS, CPP Clinical Pharmacist

## 2024-06-15 NOTE — Telephone Encounter (Signed)
 Submitted an appeal to [facility] for ORENCIA  SQ.  Case # [id number] Fax: [phone] Phone: [phone]  Sherry Pennant, PharmD, MPH, BCPS, CPP Clinical Pharmacist

## 2024-06-18 ENCOUNTER — Other Ambulatory Visit: Payer: Self-pay

## 2024-06-20 ENCOUNTER — Other Ambulatory Visit: Payer: Self-pay

## 2024-06-20 ENCOUNTER — Other Ambulatory Visit (HOSPITAL_COMMUNITY): Payer: Self-pay

## 2024-06-20 NOTE — Progress Notes (Signed)
 Specialty Pharmacy Refill Coordination Note  Spoke with Cindy Fullman is a 78 y.o. female contacted today regarding refills of specialty medication(s) Abatacept  (Orencia  ClickJect)  Doses on hand: 1 for 12/31  Next inj: 06/27/24   Patient requested: Delivery   Delivery date: 06/26/24   Verified address: [address]  [place] [address]  Medication will be filled on 06/25/24

## 2024-06-25 ENCOUNTER — Other Ambulatory Visit: Payer: Self-pay

## 2024-06-26 NOTE — Telephone Encounter (Signed)
 Received letter from [facility]. Orencia  denial has been OVERTURNED. Orencia  is approved through 06/20/2025  Case # [id number]  Sherry Pennant, PharmD, MPH, BCPS, CPP Clinical Pharmacist

## 2024-07-03 LAB — HM DEXA SCAN

## 2024-07-05 ENCOUNTER — Telehealth: Payer: Self-pay

## 2024-07-05 NOTE — Telephone Encounter (Signed)
 Received DEXA results from [facility].  Date of Scan: 07/03/2024  Lowest T-score: -2.4  BMD: 0.584  Lowest site measured: Right Femoral Neck  DX: Osteopenia  Significant changes in BMD and site measured (5% and above):  -8% Right Total Femur  Current Regimen: Fosamax  and Vitamin D   Recommendation: AP Spine unchanged. Calcium, Vitamin D , and resistive exercises. Continue Fosamax .   Reviewed by: Waddell Craze, PA-C  Next Appointment: 10/10/2024  Patient advised AP Spine unchanged. Calcium, Vitamin D , and resistive exercises. Continue Fosamax . Patient verbalized understanding.

## 2024-07-13 ENCOUNTER — Other Ambulatory Visit: Payer: Self-pay

## 2024-07-16 ENCOUNTER — Other Ambulatory Visit: Payer: Self-pay

## 2024-07-17 ENCOUNTER — Other Ambulatory Visit: Payer: Self-pay

## 2024-07-18 ENCOUNTER — Other Ambulatory Visit: Payer: Self-pay

## 2024-07-19 ENCOUNTER — Encounter: Payer: Self-pay | Admitting: Physician Assistant

## 2024-07-20 ENCOUNTER — Other Ambulatory Visit: Payer: Self-pay

## 2024-07-23 ENCOUNTER — Other Ambulatory Visit: Payer: Self-pay

## 2024-07-25 ENCOUNTER — Other Ambulatory Visit (HOSPITAL_COMMUNITY): Payer: Self-pay

## 2024-07-25 NOTE — Progress Notes (Signed)
 Reached out to patient regarding Orencia  copay. Unable to reach. Left VM requesting return call  Sherry Pennant, PharmD, MPH, BCPS, CPP Clinical Pharmacist

## 2024-07-26 ENCOUNTER — Other Ambulatory Visit (HOSPITAL_COMMUNITY): Payer: Self-pay

## 2024-07-27 ENCOUNTER — Other Ambulatory Visit: Payer: Self-pay

## 2024-10-10 ENCOUNTER — Ambulatory Visit: Admitting: Physician Assistant
